# Patient Record
Sex: Male | Born: 1950
Health system: Southern US, Community
[De-identification: ages and names within clinical notes are randomized; demographics above are authoritative.]

## PROBLEM LIST (undated history)

## (undated) DIAGNOSIS — T7840XA Allergy, unspecified, initial encounter: Secondary | ICD-10-CM

## (undated) DIAGNOSIS — I10 Essential (primary) hypertension: Secondary | ICD-10-CM

## (undated) DIAGNOSIS — R0902 Hypoxemia: Secondary | ICD-10-CM

## (undated) DIAGNOSIS — I219 Acute myocardial infarction, unspecified: Secondary | ICD-10-CM

## (undated) DIAGNOSIS — I480 Paroxysmal atrial fibrillation: Secondary | ICD-10-CM

## (undated) DIAGNOSIS — Z8679 Personal history of other diseases of the circulatory system: Secondary | ICD-10-CM

## (undated) DIAGNOSIS — I714 Abdominal aortic aneurysm, without rupture, unspecified: Secondary | ICD-10-CM

## (undated) DIAGNOSIS — E785 Hyperlipidemia, unspecified: Secondary | ICD-10-CM

## (undated) DIAGNOSIS — I350 Nonrheumatic aortic (valve) stenosis: Secondary | ICD-10-CM

## (undated) DIAGNOSIS — M1990 Unspecified osteoarthritis, unspecified site: Secondary | ICD-10-CM

## (undated) DIAGNOSIS — IMO0001 Reserved for inherently not codable concepts without codable children: Secondary | ICD-10-CM

## (undated) DIAGNOSIS — I2510 Atherosclerotic heart disease of native coronary artery without angina pectoris: Secondary | ICD-10-CM

## (undated) DIAGNOSIS — J439 Emphysema, unspecified: Secondary | ICD-10-CM

## (undated) DIAGNOSIS — K760 Fatty (change of) liver, not elsewhere classified: Secondary | ICD-10-CM

## (undated) DIAGNOSIS — J449 Chronic obstructive pulmonary disease, unspecified: Secondary | ICD-10-CM

## (undated) DIAGNOSIS — R0600 Dyspnea, unspecified: Secondary | ICD-10-CM

## (undated) HISTORY — DX: Paroxysmal atrial fibrillation: I48.0

## (undated) HISTORY — DX: Reserved for inherently not codable concepts without codable children: IMO0001

## (undated) HISTORY — DX: Hyperlipidemia, unspecified: E78.5

## (undated) HISTORY — DX: Emphysema, unspecified: J43.9

## (undated) HISTORY — DX: Chronic obstructive pulmonary disease, unspecified: J44.9

## (undated) HISTORY — DX: Abdominal aortic aneurysm, without rupture: I71.4

## (undated) HISTORY — PX: FRACTURE SURGERY: SHX138

## (undated) HISTORY — DX: Hypoxemia: R09.02

## (undated) HISTORY — DX: Allergy, unspecified, initial encounter: T78.40XA

## (undated) HISTORY — DX: Essential (primary) hypertension: I10

## (undated) HISTORY — PX: APPENDECTOMY: SHX54

## (undated) HISTORY — PX: CARDIAC VALVE REPLACEMENT: SHX585

## (undated) HISTORY — DX: Personal history of other diseases of the circulatory system: Z86.79

## (undated) HISTORY — DX: Abdominal aortic aneurysm, without rupture, unspecified: I71.40

## (undated) HISTORY — DX: Fatty (change of) liver, not elsewhere classified: K76.0

---

## 2004-10-22 ENCOUNTER — Encounter: Payer: Self-pay | Admitting: Internal Medicine

## 2004-10-22 ENCOUNTER — Ambulatory Visit: Payer: Self-pay | Admitting: Internal Medicine

## 2004-10-22 ENCOUNTER — Ambulatory Visit (HOSPITAL_COMMUNITY): Admission: RE | Admit: 2004-10-22 | Discharge: 2004-10-22 | Payer: Self-pay | Admitting: Internal Medicine

## 2007-02-08 HISTORY — PX: ANKLE SURGERY: SHX546

## 2009-08-12 ENCOUNTER — Ambulatory Visit (HOSPITAL_COMMUNITY): Admission: RE | Admit: 2009-08-12 | Discharge: 2009-08-12 | Payer: Self-pay | Admitting: Family Medicine

## 2009-08-19 ENCOUNTER — Ambulatory Visit (HOSPITAL_COMMUNITY): Admission: RE | Admit: 2009-08-19 | Discharge: 2009-08-19 | Payer: Self-pay | Admitting: Family Medicine

## 2010-04-25 LAB — BLOOD GAS, ARTERIAL
Acid-Base Excess: 0.8 mmol/L (ref 0.0–2.0)
Bicarbonate: 25 mEq/L — ABNORMAL HIGH (ref 20.0–24.0)
FIO2: 0.21 %
O2 Saturation: 95 %
Patient temperature: 37
TCO2: 21.9 mmol/L (ref 0–100)
pCO2 arterial: 41.5 mmHg (ref 35.0–45.0)
pH, Arterial: 7.398 (ref 7.350–7.450)
pO2, Arterial: 72 mmHg — ABNORMAL LOW (ref 80.0–100.0)

## 2010-06-22 HISTORY — PX: COLONOSCOPY: SHX5424

## 2010-06-25 NOTE — Op Note (Signed)
NAME:  [name], [name]           ACCOUNT NO.:  [id number]   MEDICAL RECORD NO.:  [id number]          PATIENT TYPE:  AMB   LOCATION:  DAY                           FACILITY:  [facility]   PHYSICIAN:  R. Roetta Sessions, M.D. DATE OF BIRTH:  [birth year]   DATE OF PROCEDURE:  10/22/2004  DATE OF DISCHARGE:                                 OPERATIVE REPORT   PROCEDURE:  Colonoscopy with biopsy.   INDICATIONS FOR PROCEDURE:  The patient is a 60 year old Caucasian male with  no GI symptoms, referred at the courtesy of Dr. Lilyan Punt for colorectal  cancer screening. He has never had a colonoscopy. There is no family history  of colorectal neoplasia. Colonoscopy is now being done as a screening  maneuver. This approach has been discussed with the patient at length.  Potential risks, benefits, and alternatives have been reviewed and questions  answered.   PROCEDURE NOTE:  O2 saturation, blood pressure, pulse, and respirations were  monitored throughout the entire procedure. Conscious sedation with Versed 4  mg IV and Demerol 100 mg IV in divided doses.   INSTRUMENT:  Olympus video chip system.   FINDINGS:  Digital rectal exam revealed no abnormalities.   ENDOSCOPIC FINDINGS:  Prep was good.   Rectum:  Examination of the rectal mucosa including retroflexed view of the  anal verge revealed 2 to 3 mm polyp at 3 cm from the anal verge but  otherwise the rectal mucosa appeared normal.   Colon:  Colonic mucosa was surveyed from the rectosigmoid junction through  the left, transverse, and right colon to the area of the appendiceal  orifice, ileocecal valve, and cecum. These structures were well seen and  photographed for the record. From this level, the scope was slowly  withdrawn, and all previously mentioned mucosal surfaces were again seen.  The patient had a second 2-mm polyp at the mid ascending colon. The  remainder of the colonic mucosa appeared normal. The polyp in the ascending  colon  was cold biopsied/removed. Likewise, polyp in the rectum was cold  biopsied/removed. The patient tolerated the procedure well and was reactive  to endoscopy.   IMPRESSION:  Diminutive rectal and right colon polyps removed with cold  biopsy forceps technique. Remainder of the rectum and colon appeared normal.   RECOMMENDATIONS:  1.  Followup on pathology.  2.  Further recommendations to follow.      Jonathon Bellows, M.D.  Electronically Signed     RMR/MEDQ  D:  10/22/2004  T:  10/22/2004  Job:  [id number]   cc:   Lorin Picket A. Gerda Diss, MD  [address], [address]  [place]  [place] [address]  Fax: [phone]

## 2012-06-13 ENCOUNTER — Encounter: Payer: Self-pay | Admitting: *Deleted

## 2012-06-14 ENCOUNTER — Encounter: Payer: Self-pay | Admitting: Family Medicine

## 2012-06-14 ENCOUNTER — Ambulatory Visit (INDEPENDENT_AMBULATORY_CARE_PROVIDER_SITE_OTHER): Payer: BC Managed Care – PPO | Admitting: Family Medicine

## 2012-06-14 VITALS — BP 136/86 | Temp 98.4°F | Ht 70.0 in | Wt 208.2 lb

## 2012-06-14 DIAGNOSIS — I10 Essential (primary) hypertension: Secondary | ICD-10-CM | POA: Insufficient documentation

## 2012-06-14 DIAGNOSIS — J449 Chronic obstructive pulmonary disease, unspecified: Secondary | ICD-10-CM | POA: Insufficient documentation

## 2012-06-14 DIAGNOSIS — J309 Allergic rhinitis, unspecified: Secondary | ICD-10-CM

## 2012-06-14 MED ORDER — METHYLPREDNISOLONE ACETATE 40 MG/ML IJ SUSP
40.0000 mg | Freq: Once | INTRAMUSCULAR | Status: AC
  Administered 2012-06-14: 40 mg via INTRAMUSCULAR

## 2012-06-14 MED ORDER — FLUTICASONE PROPIONATE 50 MCG/ACT NA SUSP: 2.0000 | Freq: Every day | NASAL | Status: DC

## 2012-06-14 NOTE — Patient Instructions (Signed)
  Try generic allegra ( fexofenadine 180 mg)   Allergic Rhinitis Allergic rhinitis is when the mucous membranes in the nose respond to allergens. Allergens are particles in the air that cause your body to have an allergic reaction. This causes you to release allergic antibodies. Through a chain of events, these eventually cause you to release histamine into the blood stream (hence the use of antihistamines). Although meant to be protective to the body, it is this release that causes your discomfort, such as frequent sneezing, congestion and an itchy runny nose.  CAUSES  The pollen allergens may come from grasses, trees, and weeds. This is seasonal allergic rhinitis, or "hay fever." Other allergens cause year-round allergic rhinitis (perennial allergic rhinitis) such as house dust mite allergen, pet dander and mold spores.  SYMPTOMS   Nasal stuffiness (congestion).  Runny, itchy nose with sneezing and tearing of the eyes.  There is often an itching of the mouth, eyes and ears. It cannot be cured, but it can be controlled with medications. DIAGNOSIS  If you are unable to determine the offending allergen, skin or blood testing may find it. TREATMENT   Avoid the allergen.  Medications and allergy shots (immunotherapy) can help.  Hay fever may often be treated with antihistamines in pill or nasal spray forms. Antihistamines block the effects of histamine. There are over-the-counter medicines that may help with nasal congestion and swelling around the eyes. Check with your caregiver before taking or giving this medicine. If the treatment above does not work, there are many new medications your caregiver can prescribe. Stronger medications may be used if initial measures are ineffective. Desensitizing injections can be used if medications and avoidance fails. Desensitization is when a patient is given ongoing shots until the body becomes less sensitive to the allergen. Make sure you follow up with  your caregiver if problems continue. SEEK MEDICAL CARE IF:   You develop fever (more than 100.5 F (38.1 C).  You develop a cough that does not stop easily (persistent).  You have shortness of breath.  You start wheezing.  Symptoms interfere with normal daily activities. Document Released: 10/19/2000 Document Revised: 04/18/2011 Document Reviewed: 04/30/2008 [facility] Patient Information 2013 [place], [place].

## 2012-06-14 NOTE — Progress Notes (Signed)
  Subjective:    Patient ID: [name], male    DOB: 1950/11/25, 62 y.o.   MRN: [id number]  HPI Patient head congestion stuffiness drainage to having difficult time breathing through his nose coughing a fair amount denies any wheezing nausea vomiting or diarrhea. Energy level overall doing pretty good. Has COPD is been under good control. Also a history of hypertension.   Review of Systems No fevers no wheezing or difficulty breathing no swelling in the legs    Objective:   Physical Exam  Eardrums are normal nostrils congested sinus nontender throat is normal neck supple lungs clear heart regular      Assessment & Plan:  Allergic rhinitis-Depo-Medrol shot, generic of Allegra, Flonase 2 sprays each nostril daily, if progressive symptoms or worse followup. Find no evidence of sinusitis or pneumonia.

## 2012-07-17 ENCOUNTER — Other Ambulatory Visit: Payer: Self-pay | Admitting: Family Medicine

## 2012-08-18 ENCOUNTER — Other Ambulatory Visit: Payer: Self-pay | Admitting: Family Medicine

## 2012-10-09 ENCOUNTER — Telehealth: Payer: Self-pay | Admitting: Family Medicine

## 2012-10-09 DIAGNOSIS — E782 Mixed hyperlipidemia: Secondary | ICD-10-CM

## 2012-10-09 DIAGNOSIS — Z79899 Other long term (current) drug therapy: Secondary | ICD-10-CM

## 2012-10-09 DIAGNOSIS — Z125 Encounter for screening for malignant neoplasm of prostate: Secondary | ICD-10-CM

## 2012-10-09 NOTE — Telephone Encounter (Signed)
Patient is requesting blood work paper for wellness.please mail to patient.He will do blood work then schedule physical.

## 2012-10-09 NOTE — Telephone Encounter (Addendum)
Blood work Clinical biochemist and mailed to patient at there request.

## 2012-10-09 NOTE — Telephone Encounter (Signed)
That seems fine, if previous lab work from last year showed hyperglycemia I would recommend a hemoglobin A1c.

## 2012-10-09 NOTE — Telephone Encounter (Signed)
Pt with HTN    - Lipid , Liver Met 7 PSA -anything else you want ordered

## 2012-10-09 NOTE — Addendum Note (Signed)
Addended by: Margaretha Sheffield on: 10/09/2012 02:16 PM   Modules accepted: Orders

## 2012-10-18 LAB — BASIC METABOLIC PANEL
BUN: 14 mg/dL (ref 6–23)
CO2: 28 mEq/L (ref 19–32)
Calcium: 9.7 mg/dL (ref 8.4–10.5)
Chloride: 105 mEq/L (ref 96–112)
Creat: 0.92 mg/dL (ref 0.50–1.35)
Glucose, Bld: 91 mg/dL (ref 70–99)
Potassium: 5.1 mEq/L (ref 3.5–5.3)
Sodium: 140 mEq/L (ref 135–145)

## 2012-10-18 LAB — LIPID PANEL
Cholesterol: 180 mg/dL (ref 0–200)
HDL: 22 mg/dL — ABNORMAL LOW (ref >39)
LDL Cholesterol: 103 mg/dL — ABNORMAL HIGH (ref 0–99)
Total CHOL/HDL Ratio: 8.2 Ratio
Triglycerides: 276 mg/dL — ABNORMAL HIGH (ref <150)
VLDL: 55 mg/dL — ABNORMAL HIGH (ref 0–40)

## 2012-10-18 LAB — HEPATIC FUNCTION PANEL
ALT: 27 U/L (ref 0–53)
AST: 31 U/L (ref 0–37)
Albumin: 4 g/dL (ref 3.5–5.2)
Alkaline Phosphatase: 30 U/L — ABNORMAL LOW (ref 39–117)
Bilirubin, Direct: 0.1 mg/dL (ref 0.0–0.3)
Indirect Bilirubin: 0.3 mg/dL (ref 0.0–0.9)
Total Bilirubin: 0.4 mg/dL (ref 0.3–1.2)
Total Protein: 6.7 g/dL (ref 6.0–8.3)

## 2012-10-18 LAB — PSA: PSA: 1.31 ng/mL (ref <=4.00)

## 2012-10-22 ENCOUNTER — Other Ambulatory Visit: Payer: Self-pay | Admitting: Family Medicine

## 2012-10-29 ENCOUNTER — Other Ambulatory Visit: Payer: Self-pay | Admitting: Family Medicine

## 2012-11-01 ENCOUNTER — Ambulatory Visit (INDEPENDENT_AMBULATORY_CARE_PROVIDER_SITE_OTHER): Payer: BC Managed Care – PPO | Admitting: Family Medicine

## 2012-11-01 ENCOUNTER — Encounter: Payer: Self-pay | Admitting: Family Medicine

## 2012-11-01 VITALS — BP 134/82 | Ht 69.0 in | Wt 208.0 lb

## 2012-11-01 DIAGNOSIS — J449 Chronic obstructive pulmonary disease, unspecified: Secondary | ICD-10-CM

## 2012-11-01 DIAGNOSIS — Z0000 Encounter for general adult medical examination without abnormal findings: Secondary | ICD-10-CM

## 2012-11-01 DIAGNOSIS — I10 Essential (primary) hypertension: Secondary | ICD-10-CM

## 2012-11-01 MED ORDER — FLUTICASONE-SALMETEROL 100-50 MCG/DOSE IN AEPB: INHALATION_SPRAY | RESPIRATORY_TRACT | Status: DC

## 2012-11-01 MED ORDER — ATORVASTATIN CALCIUM 40 MG PO TABS: ORAL_TABLET | ORAL | Status: DC

## 2012-11-01 MED ORDER — TIOTROPIUM BROMIDE MONOHYDRATE 18 MCG IN CAPS: ORAL_CAPSULE | RESPIRATORY_TRACT | Status: DC

## 2012-11-01 MED ORDER — FENOFIBRATE 160 MG PO TABS: 160.0000 mg | ORAL_TABLET | Freq: Every day | ORAL | Status: DC

## 2012-11-01 MED ORDER — LISINOPRIL 10 MG PO TABS: 10.0000 mg | ORAL_TABLET | Freq: Every day | ORAL | Status: DC

## 2012-11-01 NOTE — Patient Instructions (Addendum)
DASH Diet  The DASH diet stands for "Dietary Approaches to Stop Hypertension." It is a healthy eating plan that has been shown to reduce high blood pressure (hypertension) in as little as 14 days, while also possibly providing other significant health benefits. These other health benefits include reducing the risk of breast cancer after menopause and reducing the risk of type 2 diabetes, heart disease, colon cancer, and stroke. Health benefits also include weight loss and slowing kidney failure in patients with chronic kidney disease.   DIET GUIDELINES  · Limit salt (sodium). Your diet should contain less than 1500 mg of sodium daily.  · Limit refined or processed carbohydrates. Your diet should include mostly whole grains. Desserts and added sugars should be used sparingly.  · Include small amounts of heart-healthy fats. These types of fats include nuts, oils, and tub margarine. Limit saturated and trans fats. These fats have been shown to be harmful in the body.  CHOOSING FOODS   The following food groups are based on a 2000 calorie diet. See your Registered Dietitian for individual calorie needs.  Grains and Grain Products (6 to 8 servings daily)  · Eat More Often: Whole-wheat bread, brown rice, whole-grain or wheat pasta, quinoa, popcorn without added fat or salt (air popped).  · Eat Less Often: White bread, white pasta, white rice, cornbread.  Vegetables (4 to 5 servings daily)  · Eat More Often: Fresh, frozen, and canned vegetables. Vegetables may be raw, steamed, roasted, or grilled with a minimal amount of fat.  · Eat Less Often/Avoid: Creamed or fried vegetables. Vegetables in a cheese sauce.  Fruit (4 to 5 servings daily)  · Eat More Often: All fresh, canned (in natural juice), or frozen fruits. Dried fruits without added sugar. One hundred percent fruit juice (½ cup [237 mL] daily).  · Eat Less Often: Dried fruits with added sugar. Canned fruit in light or heavy syrup.  Lean Meats, Fish, and Poultry (2  servings or less daily. One serving is 3 to 4 oz [85-114 g]).  · Eat More Often: Ninety percent or leaner ground beef, tenderloin, sirloin. Round cuts of beef, chicken breast, turkey breast. All fish. Grill, bake, or broil your meat. Nothing should be fried.  · Eat Less Often/Avoid: Fatty cuts of meat, turkey, or chicken leg, thigh, or wing. Fried cuts of meat or fish.  Dairy (2 to 3 servings)  · Eat More Often: Low-fat or fat-free milk, low-fat plain or light yogurt, reduced-fat or part-skim cheese.  · Eat Less Often/Avoid: Milk (whole, 2%). Whole milk yogurt. Full-fat cheeses.  Nuts, Seeds, and Legumes (4 to 5 servings per week)  · Eat More Often: All without added salt.  · Eat Less Often/Avoid: Salted nuts and seeds, canned beans with added salt.  Fats and Sweets (limited)  · Eat More Often: Vegetable oils, tub margarines without trans fats, sugar-free gelatin. Mayonnaise and salad dressings.  · Eat Less Often/Avoid: Coconut oils, palm oils, butter, stick margarine, cream, half and half, cookies, candy, pie.  FOR MORE INFORMATION  The Dash Diet Eating Plan: www.dashdiet.org  Document Released: 01/13/2011 Document Revised: 04/18/2011 Document Reviewed: 01/13/2011  ExitCare® Patient Information ©2014 [employer].

## 2012-11-01 NOTE — Progress Notes (Signed)
  Subjective:    Patient ID: [name], male    DOB: 27-May-1950, 62 y.o.   MRN: [id number]  HPI Patient is here today for annual exam.  Pt has no concerns.  The patient comes in today for a wellness visit.  A review of their health history was completed.  A review of medications was also completed. Any necessary refills were discussed. Sensible healthy diet was discussed. Importance of minimizing excessive salt and carbohydrates was also discussed. Safety was stressed including driving, activities at work and at home where applicable. Importance of regular physical activity for overall health was discussed. Preventative measures appropriate for age were discussed. Time was spent with the patient discussing any concerns they have about their well-being. His lab was reviewed in detail with the patient. He is compliant with his medicines. He does try to watch his diet. He does try to stay physically active. Denies any chest tightness pressure pain shortness breath.   Review of Systems  Constitutional: Negative for fever, activity change and appetite change.  HENT: Negative for congestion, rhinorrhea and neck pain.   Eyes: Negative for discharge.  Respiratory: Negative for cough and wheezing.   Cardiovascular: Negative for chest pain.  Gastrointestinal: Negative for vomiting, abdominal pain and blood in stool.  Genitourinary: Negative for frequency and difficulty urinating.  Skin: Negative for rash.  Allergic/Immunologic: Negative for environmental allergies and food allergies.  Neurological: Negative for weakness and headaches.  Psychiatric/Behavioral: Negative for agitation.       Objective:   Physical Exam  Constitutional: He appears well-developed and well-nourished.  HENT:  Head: Normocephalic and atraumatic.  Right Ear: External ear normal.  Left Ear: External ear normal.  Nose: Nose normal.  Mouth/Throat: Oropharynx is clear and moist.  Eyes: EOM are normal.  Pupils are equal, round, and reactive to light.  Neck: Normal range of motion. Neck supple. No thyromegaly present.  Cardiovascular: Normal rate, regular rhythm and normal heart sounds.   No murmur heard. Pulmonary/Chest: Effort normal and breath sounds normal. No respiratory distress. He has no wheezes.  Abdominal: Soft. Bowel sounds are normal. He exhibits no distension and no mass. There is no tenderness.  Genitourinary: Rectum normal, prostate normal and penis normal.  Musculoskeletal: Normal range of motion. He exhibits no edema.  Lymphadenopathy:    He has no cervical adenopathy.  Neurological: He is alert. He exhibits normal muscle tone.  Skin: Skin is warm and dry. No erythema.  Psychiatric: He has a normal mood and affect. His behavior is normal. Judgment normal.          Assessment & Plan:  Wellness exam-continue all current medications. Watch diet closely exercise as tolerated. Lab work was reviewed with patient. No need to repeat any time soon. Followup again in 6 months. In addition to this if he has any type of respiratory illness high fever chills followup sooner. Shingles vaccine recommended prescription given

## 2013-04-10 ENCOUNTER — Encounter: Payer: Self-pay | Admitting: Family Medicine

## 2013-04-10 ENCOUNTER — Ambulatory Visit (INDEPENDENT_AMBULATORY_CARE_PROVIDER_SITE_OTHER): Payer: BC Managed Care – PPO | Admitting: Family Medicine

## 2013-04-10 VITALS — BP 132/80 | Temp 98.6°F | Ht 69.0 in | Wt 210.0 lb

## 2013-04-10 DIAGNOSIS — Z0000 Encounter for general adult medical examination without abnormal findings: Secondary | ICD-10-CM

## 2013-04-10 DIAGNOSIS — E785 Hyperlipidemia, unspecified: Secondary | ICD-10-CM

## 2013-04-10 MED ORDER — LEVOFLOXACIN 500 MG PO TABS: 500.0000 mg | ORAL_TABLET | Freq: Every day | ORAL | Status: AC

## 2013-04-10 MED ORDER — HYDROCODONE-HOMATROPINE 5-1.5 MG/5ML PO SYRP: 5.0000 mL | ORAL_SOLUTION | Freq: Four times a day (QID) | ORAL | Status: DC | PRN

## 2013-04-10 NOTE — Progress Notes (Signed)
   Subjective:    Patient ID: [name], male    DOB: 1950/09/02, 63 y.o.   MRN: [id number]  Cough This is a new problem. The current episode started in the past 7 days. Associated symptoms include headaches and a sore throat. Associated symptoms comments: Drainage from eyes. He has tried OTC cough suppressant (OTC meds) for the symptoms. His past medical history is significant for COPD.   he does relate bilateral eye conjunctivitis over the past 24 hours denies any wheezing PMH benign   Review of Systems  HENT: Positive for sore throat.   Respiratory: Positive for cough.   Neurological: Positive for headaches.       Objective:   Physical Exam  Bilateral conjunctivitis eardrums normal throat is normal neck supple lungs clear      Assessment & Plan:  Patient will do preventative lab work of cholesterol sugar he is on medication followup later this spring to see how this is doing  Sinusitis antibiotics prescribed warning signs discussed  Mild conjunctivitis should get better with antibiotics if not he will call back and we'll call in some drops

## 2013-04-23 ENCOUNTER — Encounter: Payer: Self-pay | Admitting: Family Medicine

## 2013-04-23 LAB — LIPID PANEL
Cholesterol: 167 mg/dL (ref 0–200)
HDL: 33 mg/dL — ABNORMAL LOW (ref >39)
LDL Cholesterol: 67 mg/dL (ref 0–99)
Total CHOL/HDL Ratio: 5.1 Ratio
Triglycerides: 333 mg/dL — ABNORMAL HIGH (ref <150)
VLDL: 67 mg/dL — ABNORMAL HIGH (ref 0–40)

## 2013-04-23 LAB — GLUCOSE, RANDOM: Glucose, Bld: 95 mg/dL (ref 70–99)

## 2013-05-02 ENCOUNTER — Encounter: Payer: Self-pay | Admitting: Family Medicine

## 2013-05-02 ENCOUNTER — Ambulatory Visit (INDEPENDENT_AMBULATORY_CARE_PROVIDER_SITE_OTHER): Payer: BC Managed Care – PPO | Admitting: Family Medicine

## 2013-05-02 VITALS — BP 134/84 | Ht 69.0 in | Wt 215.8 lb

## 2013-05-02 DIAGNOSIS — J449 Chronic obstructive pulmonary disease, unspecified: Secondary | ICD-10-CM

## 2013-05-02 DIAGNOSIS — I10 Essential (primary) hypertension: Secondary | ICD-10-CM

## 2013-05-02 DIAGNOSIS — E785 Hyperlipidemia, unspecified: Secondary | ICD-10-CM | POA: Insufficient documentation

## 2013-05-02 MED ORDER — ALBUTEROL SULFATE HFA 108 (90 BASE) MCG/ACT IN AERS: INHALATION_SPRAY | RESPIRATORY_TRACT | Status: DC

## 2013-05-02 MED ORDER — TIOTROPIUM BROMIDE MONOHYDRATE 18 MCG IN CAPS: ORAL_CAPSULE | RESPIRATORY_TRACT | Status: DC

## 2013-05-02 MED ORDER — FENOFIBRATE 160 MG PO TABS: 160.0000 mg | ORAL_TABLET | Freq: Every day | ORAL | Status: DC

## 2013-05-02 MED ORDER — FLUTICASONE PROPIONATE 50 MCG/ACT NA SUSP: 2.0000 | Freq: Every day | NASAL | Status: DC

## 2013-05-02 MED ORDER — FLUTICASONE-SALMETEROL 100-50 MCG/DOSE IN AEPB: INHALATION_SPRAY | RESPIRATORY_TRACT | Status: DC

## 2013-05-02 MED ORDER — LISINOPRIL 10 MG PO TABS: 10.0000 mg | ORAL_TABLET | Freq: Every day | ORAL | Status: DC

## 2013-05-02 MED ORDER — ATORVASTATIN CALCIUM 40 MG PO TABS: ORAL_TABLET | ORAL | Status: DC

## 2013-05-02 NOTE — Progress Notes (Signed)
   Subjective:    Patient ID: [name], male    DOB: February 16, 1950, 63 y.o.   MRN: [id number]  HPI  Patient arrives to follow up on cholesterol and discuss recent lab results. Patient reports no problems or concerns at this time.  Review of Systems     Objective:   Physical Exam        Assessment & Plan:  #1 COPD the rationale about his medications was reviewed in detail. He wondered if some of the medicine sees on TV would help him better. I believe he is thoroughly being covered with his current medication but if he is interested in a different medicine he needs to let us know we would let him know if we think it would help more. The pathophysiology of COPD was discussed in detail.  #2 HTN good control continue current measures  #3 hyperlipidemia triglycerides is elevated but otherwise LDL looks good HDL is low healthy diet regular activity and taken his medicines were discussed in detail.  #4 important to try to keep his weight down was discussed in order to lessen the risk of long-term health problems wellness exam later this year

## 2013-10-31 ENCOUNTER — Telehealth: Payer: Self-pay | Admitting: Family Medicine

## 2013-10-31 DIAGNOSIS — E785 Hyperlipidemia, unspecified: Secondary | ICD-10-CM

## 2013-10-31 DIAGNOSIS — Z125 Encounter for screening for malignant neoplasm of prostate: Secondary | ICD-10-CM

## 2013-10-31 DIAGNOSIS — R5383 Other fatigue: Secondary | ICD-10-CM

## 2013-10-31 DIAGNOSIS — R5381 Other malaise: Secondary | ICD-10-CM

## 2013-10-31 DIAGNOSIS — Z79899 Other long term (current) drug therapy: Secondary | ICD-10-CM

## 2013-10-31 NOTE — Telephone Encounter (Signed)
Patient needs order for blood work for upcoming physical. °

## 2013-10-31 NOTE — Telephone Encounter (Signed)
appt 10/21 for physical.  Last bloodwork 04/23/13 lipid, glucose

## 2013-11-03 NOTE — Telephone Encounter (Signed)
Lipid, liver, metabolic 7, PSA, CBC 

## 2013-11-04 NOTE — Telephone Encounter (Signed)
Notified patient's wife that bloodwork has been ordered.  

## 2013-11-08 LAB — LIPID PANEL
Cholesterol: 149 mg/dL (ref 0–200)
HDL: 30 mg/dL — ABNORMAL LOW (ref >39)
LDL Cholesterol: 48 mg/dL (ref 0–99)
Total CHOL/HDL Ratio: 5 Ratio
Triglycerides: 356 mg/dL — ABNORMAL HIGH (ref <150)
VLDL: 71 mg/dL — ABNORMAL HIGH (ref 0–40)

## 2013-11-08 LAB — BASIC METABOLIC PANEL
BUN: 13 mg/dL (ref 6–23)
CO2: 27 mEq/L (ref 19–32)
Calcium: 9.9 mg/dL (ref 8.4–10.5)
Chloride: 103 mEq/L (ref 96–112)
Creat: 0.89 mg/dL (ref 0.50–1.35)
Glucose, Bld: 115 mg/dL — ABNORMAL HIGH (ref 70–99)
Potassium: 4.4 mEq/L (ref 3.5–5.3)
Sodium: 141 mEq/L (ref 135–145)

## 2013-11-08 LAB — CBC WITH DIFFERENTIAL/PLATELET
Basophils Absolute: 0.1 10*3/uL (ref 0.0–0.1)
Basophils Relative: 1 % (ref 0–1)
Eosinophils Absolute: 0.2 10*3/uL (ref 0.0–0.7)
Eosinophils Relative: 2 % (ref 0–5)
HCT: 44.6 % (ref 39.0–52.0)
Hemoglobin: 15 g/dL (ref 13.0–17.0)
Lymphocytes Relative: 26 % (ref 12–46)
Lymphs Abs: 2.1 10*3/uL (ref 0.7–4.0)
MCH: 30.8 pg (ref 26.0–34.0)
MCHC: 33.6 g/dL (ref 30.0–36.0)
MCV: 91.6 fL (ref 78.0–100.0)
Monocytes Absolute: 1.1 10*3/uL — ABNORMAL HIGH (ref 0.1–1.0)
Monocytes Relative: 13 % — ABNORMAL HIGH (ref 3–12)
Neutro Abs: 4.8 10*3/uL (ref 1.7–7.7)
Neutrophils Relative %: 58 % (ref 43–77)
Platelets: 281 10*3/uL (ref 150–400)
RBC: 4.87 MIL/uL (ref 4.22–5.81)
RDW: 13.3 % (ref 11.5–15.5)
WBC: 8.2 10*3/uL (ref 4.0–10.5)

## 2013-11-08 LAB — HEPATIC FUNCTION PANEL
ALT: 23 U/L (ref 0–53)
AST: 31 U/L (ref 0–37)
Albumin: 4.2 g/dL (ref 3.5–5.2)
Alkaline Phosphatase: 37 U/L — ABNORMAL LOW (ref 39–117)
Bilirubin, Direct: 0.1 mg/dL (ref 0.0–0.3)
Indirect Bilirubin: 0.4 mg/dL (ref 0.2–1.2)
Total Bilirubin: 0.5 mg/dL (ref 0.2–1.2)
Total Protein: 6.8 g/dL (ref 6.0–8.3)

## 2013-11-09 LAB — PSA: PSA: 1.84 ng/mL (ref <=4.00)

## 2013-11-27 ENCOUNTER — Ambulatory Visit (INDEPENDENT_AMBULATORY_CARE_PROVIDER_SITE_OTHER): Payer: BC Managed Care – PPO | Admitting: Family Medicine

## 2013-11-27 ENCOUNTER — Encounter: Payer: Self-pay | Admitting: Family Medicine

## 2013-11-27 VITALS — BP 122/88 | Ht 69.0 in | Wt 218.0 lb

## 2013-11-27 DIAGNOSIS — J438 Other emphysema: Secondary | ICD-10-CM

## 2013-11-27 DIAGNOSIS — Z0000 Encounter for general adult medical examination without abnormal findings: Secondary | ICD-10-CM

## 2013-11-27 DIAGNOSIS — R7309 Other abnormal glucose: Secondary | ICD-10-CM

## 2013-11-27 DIAGNOSIS — I10 Essential (primary) hypertension: Secondary | ICD-10-CM

## 2013-11-27 DIAGNOSIS — R739 Hyperglycemia, unspecified: Secondary | ICD-10-CM

## 2013-11-27 DIAGNOSIS — R7303 Prediabetes: Secondary | ICD-10-CM | POA: Insufficient documentation

## 2013-11-27 DIAGNOSIS — E785 Hyperlipidemia, unspecified: Secondary | ICD-10-CM

## 2013-11-27 LAB — POCT GLYCOSYLATED HEMOGLOBIN (HGB A1C): Hemoglobin A1C: 5.7

## 2013-11-27 NOTE — Progress Notes (Signed)
   Subjective:    Patient ID: [name], male    DOB: Jul 18, 1950, 63 y.o.   MRN: [id number]  HPI The patient comes in today for a wellness visit.  A review of their health history was completed.  A review of medications was also completed.  Any needed refills: Unsure  Eating habits: Health conscious   Falls/  MVA accidents in past few months: No  Regular exercise: No  Specialist pt sees on regular basis: No  Preventative health issues were discussed.   Additional concerns: None   Had flu vaccine through work The patient COPD is stable. He is using his inhalers as directed. He has not had any progression in his symptoms.  Patient's lab work was reviewed in detail he does have elevated triglycerides I encouraged him to continue the medication but only use this oil twice per day  Fasting blood sugar is elevated patient was counseled at length about minimizing starches in the diet increasing physical activity trying to lose weight. A1c was 5.7.   Review of Systems  Constitutional: Negative for fever, activity change and appetite change.  HENT: Negative for congestion and rhinorrhea.   Eyes: Negative for discharge.  Respiratory: Negative for cough and wheezing.   Cardiovascular: Negative for chest pain.  Gastrointestinal: Negative for vomiting, abdominal pain and blood in stool.  Genitourinary: Negative for frequency and difficulty urinating.  Musculoskeletal: Negative for neck pain.  Skin: Negative for rash.  Allergic/Immunologic: Negative for environmental allergies and food allergies.  Neurological: Negative for weakness and headaches.  Psychiatric/Behavioral: Negative for agitation.       Objective:   Physical Exam  Constitutional: He appears well-developed and well-nourished.  HENT:  Head: Normocephalic and atraumatic.  Right Ear: External ear normal.  Left Ear: External ear normal.  Nose: Nose normal.  Mouth/Throat: Oropharynx is clear and moist.    Eyes: EOM are normal. Pupils are equal, round, and reactive to light.  Neck: Normal range of motion. Neck supple. No thyromegaly present.  Cardiovascular: Normal rate, regular rhythm and normal heart sounds.   No murmur heard. Pulmonary/Chest: Effort normal and breath sounds normal. No respiratory distress. He has no wheezes.  Abdominal: Soft. Bowel sounds are normal. He exhibits no distension and no mass. There is no tenderness.  Genitourinary: Rectum normal, prostate normal and penis normal.  Musculoskeletal: Normal range of motion. He exhibits no edema.  Lymphadenopathy:    He has no cervical adenopathy.  Neurological: He is alert. He exhibits normal muscle tone.  Skin: Skin is warm and dry. No erythema.  Psychiatric: He has a normal mood and affect. His behavior is normal. Judgment normal.          Assessment & Plan:  #1 wellness-safety measures dietary measures discussed. Patient is due for colonoscopy in September 2016 information was given he was encouraged by springtime to connect with the gastroenterologist  #2 prediabetes-I would recommend a recheck hemoglobin A1c in the spring time minimize starches regular physical activity try to bring weight down a little bit  #3 COPD stable continue current measures he is up-to-date on flu vaccine and pneumonia vaccine  #4 hyperlipidemia LDL is where it needs to be HDL typically runs low for this patient triglyceride slightly elevated he needs to watch his starches in his diet  #5 HTN good control continue current measures  Recheck patient in 6 months

## 2013-11-27 NOTE — Patient Instructions (Signed)
Diabetes Mellitus and Food It is important for you to manage your blood sugar (glucose) level. Your blood glucose level can be greatly affected by what you eat. Eating healthier foods in the appropriate amounts throughout the day at about the same time each day will help you control your blood glucose level. It can also help slow or prevent worsening of your diabetes mellitus. Healthy eating may even help you improve the level of your blood pressure and reach or maintain a healthy weight.  HOW CAN FOOD AFFECT ME? Carbohydrates Carbohydrates affect your blood glucose level more than any other type of food. Your dietitian will help you determine how many carbohydrates to eat at each meal and teach you how to count carbohydrates. Counting carbohydrates is important to keep your blood glucose at a healthy level, especially if you are using insulin or taking certain medicines for diabetes mellitus. Alcohol Alcohol can cause sudden decreases in blood glucose (hypoglycemia), especially if you use insulin or take certain medicines for diabetes mellitus. Hypoglycemia can be a life-threatening condition. Symptoms of hypoglycemia (sleepiness, dizziness, and disorientation) are similar to symptoms of having too much alcohol.  If your health care provider has given you approval to drink alcohol, do so in moderation and use the following guidelines:  Women should not have more than one drink per day, and men should not have more than two drinks per day. One drink is equal to:  12 oz of beer.  5 oz of wine.  1 oz of hard liquor.  Do not drink on an empty stomach.  Keep yourself hydrated. Have water, diet soda, or unsweetened iced tea.  Regular soda, juice, and other mixers might contain a lot of carbohydrates and should be counted. WHAT FOODS ARE NOT RECOMMENDED? As you make food choices, it is important to remember that all foods are not the same. Some foods have fewer nutrients per serving than other  foods, even though they might have the same number of calories or carbohydrates. It is difficult to get your body what it needs when you eat foods with fewer nutrients. Examples of foods that you should avoid that are high in calories and carbohydrates but low in nutrients include:  Trans fats (most processed foods list trans fats on the Nutrition Facts label).  Regular soda.  Juice.  Candy.  Sweets, such as cake, pie, doughnuts, and cookies.  Fried foods. WHAT FOODS CAN I EAT? Have nutrient-rich foods, which will nourish your body and keep you healthy. The food you should eat also will depend on several factors, including:  The calories you need.  The medicines you take.  Your weight.  Your blood glucose level.  Your blood pressure level.  Your cholesterol level. You also should eat a variety of foods, including:  Protein, such as meat, poultry, fish, tofu, nuts, and seeds (lean animal proteins are best).  Fruits.  Vegetables.  Dairy products, such as milk, cheese, and yogurt (low fat is best).  Breads, grains, pasta, cereal, rice, and beans.  Fats such as olive oil, trans fat-free margarine, canola oil, avocado, and olives. DOES EVERYONE WITH DIABETES MELLITUS HAVE THE SAME MEAL PLAN? Because every person with diabetes mellitus is different, there is not one meal plan that works for everyone. It is very important that you meet with a dietitian who will help you create a meal plan that is just right for you. Document Released: 10/21/2004 Document Revised: 01/29/2013 Document Reviewed: 12/21/2012 Exit[employer] Patient Information 2015 [employer]. This   information is not intended to replace advice given to you by your health care provider. Make sure you discuss any questions you have with your health care provider.  

## 2013-11-28 ENCOUNTER — Other Ambulatory Visit: Payer: Self-pay | Admitting: Family Medicine

## 2013-12-23 ENCOUNTER — Other Ambulatory Visit: Payer: Self-pay | Admitting: Family Medicine

## 2013-12-27 ENCOUNTER — Telehealth: Payer: Self-pay | Admitting: Internal Medicine

## 2013-12-27 ENCOUNTER — Other Ambulatory Visit: Payer: Self-pay | Admitting: Family Medicine

## 2013-12-27 NOTE — Telephone Encounter (Signed)
Patient not due for 1565yr tcs until 10/2014 but insurance will be changing and wants to know if it can be done in January.  Patient is having no problems   Please call [phone]

## 2013-12-27 NOTE — Telephone Encounter (Signed)
I called pt and LMOM that I will have him on my call list to contact in Dec to schedule colonoscopy for Jan 2016. Call if questions.

## 2013-12-30 ENCOUNTER — Other Ambulatory Visit: Payer: Self-pay | Admitting: Family Medicine

## 2014-01-29 ENCOUNTER — Other Ambulatory Visit: Payer: Self-pay | Admitting: Family Medicine

## 2014-02-21 ENCOUNTER — Other Ambulatory Visit: Payer: Self-pay | Admitting: Family Medicine

## 2014-02-22 ENCOUNTER — Other Ambulatory Visit: Payer: Self-pay | Admitting: Family Medicine

## 2014-03-11 ENCOUNTER — Telehealth: Payer: Self-pay

## 2014-03-11 NOTE — Telephone Encounter (Signed)
PATIENT RECEIVED LETTER TO SCHEDULE COLONOSCOPY PLEASE CALL  °

## 2014-03-11 NOTE — Telephone Encounter (Signed)
Pt is on recall for 10/2014. ( Last colonoscopy was 10/22/2004).

## 2014-03-19 ENCOUNTER — Telehealth: Payer: Self-pay

## 2014-03-19 ENCOUNTER — Other Ambulatory Visit: Payer: Self-pay

## 2014-03-19 DIAGNOSIS — Z1211 Encounter for screening for malignant neoplasm of colon: Secondary | ICD-10-CM

## 2014-03-25 NOTE — Telephone Encounter (Signed)
Gastroenterology Pre-Procedure Review  Request Date: 04/08/2014 Requesting Physician: Dr. Lilyan PuntScott [name]  PATIENT REVIEW QUESTIONS: The patient responded to the following health history questions as indicated:    Pt said he drinks a couple of beers a day  1. Diabetes Melitis: no 2. Joint replacements in the past 12 months: no 3. Major health problems in the past 3 months: no 4. Has an artificial valve or MVP: no 5. Has a defibrillator: no 6. Has been advised in past to take antibiotics in advance of a procedure like teeth cleaning: no    MEDICATIONS & ALLERGIES:    Patient reports the following regarding taking any blood thinners:   Plavix? no Aspirin? no Coumadin? no  Patient confirms/reports the following medications:  Current Outpatient Prescriptions  Medication Sig Dispense Refill  . ADVAIR DISKUS 100-50 MCG/DOSE AEPB INHALE ONE DOSE BY MOUTH TWICE DAILY 60 each 5  . atorvastatin (LIPITOR) 40 MG tablet TAKE ONE TABLET BY MOUTH ONCE DAILY 30 tablet 5  . fenofibrate 160 MG tablet TAKE ONE TABLET BY MOUTH ONCE DAILY 30 tablet 3  . fish oil-omega-3 fatty acids 1000 MG capsule Take 2 g by mouth daily.    [name]. lisinopril (PRINIVIL,ZESTRIL) 10 MG tablet TAKE ONE TABLET BY MOUTH ONCE DAILY 30 tablet 5  . Multiple Vitamin (MULTIVITAMIN) tablet Take 1 tablet by mouth daily.    [name]. SPIRIVA HANDIHALER 18 MCG inhalation capsule INHALE ONE DOSE ONCE DAILY 30 capsule 5  . VENTOLIN HFA 108 (90 BASE) MCG/ACT inhaler INHALE TWO PUFFS BY MOUTH EVERY 4 HOURS AS NEEDED 18 each 0  . fluticasone (FLONASE) 50 MCG/ACT nasal spray Place 2 sprays into both nostrils daily. (Patient not taking: Reported on 03/19/2014) 16 g 5   No current facility-administered medications for this visit.    Patient confirms/reports the following allergies:  No Known Allergies  No orders of the defined types were placed in this encounter.    AUTHORIZATION INFORMATION Primary Insurance:   ID #:  Group #:  Pre-Cert / Auth  required:  Pre-Cert / Auth #:   Secondary Insurance:  ID #:  Group #:  Pre-Cert / Auth required:  Pre-Cert / Auth #:   SCHEDULE INFORMATION: Procedure has been scheduled as follows:  Date: 04/08/2014                Time:  10:30 AM Location: [facility]  This Gastroenterology Pre-Precedure Review Form is being routed to the following provider(s): R. Roetta SessionsMichael Rourk, MD

## 2014-03-26 NOTE — Telephone Encounter (Signed)
Needs appt to decide about Propofol

## 2014-03-28 NOTE — Telephone Encounter (Signed)
Pt is aware he needs OV first. He is scheduled for 04/16/2014 at 8:30 AM with Wynne DustEric Gill, NP. His appt for TCS was cancelled and I [facility] for [facility].

## 2014-04-08 ENCOUNTER — Encounter (HOSPITAL_COMMUNITY): Admission: RE | Payer: Self-pay | Source: Ambulatory Visit

## 2014-04-08 ENCOUNTER — Ambulatory Visit (HOSPITAL_COMMUNITY)
Admission: RE | Admit: 2014-04-08 | Payer: BLUE CROSS/BLUE SHIELD | Source: Ambulatory Visit | Admitting: Internal Medicine

## 2014-04-08 SURGERY — COLONOSCOPY
Anesthesia: Moderate Sedation

## 2014-04-14 ENCOUNTER — Other Ambulatory Visit: Payer: Self-pay | Admitting: Family Medicine

## 2014-04-16 ENCOUNTER — Ambulatory Visit (INDEPENDENT_AMBULATORY_CARE_PROVIDER_SITE_OTHER): Payer: BLUE CROSS/BLUE SHIELD | Admitting: Nurse Practitioner

## 2014-04-16 ENCOUNTER — Other Ambulatory Visit: Payer: Self-pay

## 2014-04-16 ENCOUNTER — Encounter: Payer: Self-pay | Admitting: Nurse Practitioner

## 2014-04-16 VITALS — BP 112/71 | HR 86 | Temp 98.4°F | Ht 69.0 in | Wt 221.6 lb

## 2014-04-16 DIAGNOSIS — Z1211 Encounter for screening for malignant neoplasm of colon: Secondary | ICD-10-CM | POA: Insufficient documentation

## 2014-04-16 MED ORDER — PEG 3350-KCL-NA BICARB-NACL 420 G PO SOLR: 4000.0000 mL | Freq: Once | ORAL | Status: DC

## 2014-04-16 NOTE — Patient Instructions (Signed)
1. We will schedule your procedure (colonoscopy in the OR with stronger sedation medicine) for you. 2. Further recommendations to be based on the results of your procedure.

## 2014-04-16 NOTE — Assessment & Plan Note (Signed)
Last colonoscopy 10 years ago with polypectomy x 2, one hyperplastic and one with inflammation; recommended 10 year follow-up. Deneis any red flag/warning signs like overt GI bleed, change in weight, appetite, or bowel habits/consistency. Drinks up to 2 drinks a day of alcohol, will schedule in the OR with propofol for daily ETOH use.  Proceed with TCS with Dr. Jena Gaussourk in the OR with propofol for daily ETOH use in near future: the risks, benefits, and alternatives have been discussed with the patient in detail. The patient states understanding and desires to proceed.

## 2014-04-16 NOTE — Progress Notes (Signed)
Primary Care Physician:  Lilyan PuntLUKING,[name], MD Primary Gastroenterologist:  Dr. Jena [name]  Chief Complaint  Patient presents with  . Colonoscopy    HPI:   64 year old male presents for 10 year of llow-up colonoscopy. Last CS in 2006 which had polypectomy x 2 (rectum and right colon) with pathology showing inflammation in one polyp and hyperplasia without adenoma on the other.  Today he denies abdominal pain, N/V/D, hematochezia, melena, chest pain, fever, chills, unintentional weight loss, change in appetite, and change in bowel habits, dysphagia. Admits shortness of breath consistent with COPD history which is near baseline for him.Denies any other upper or lower GI synptoms  Past Medical History  Diagnosis Date  . White coat hypertension   . COPD (chronic obstructive pulmonary disease)   . Hyperlipidemia   . Hypertension     Past Surgical History  Procedure Laterality Date  . Appendectomy    . Colonoscopy  06/22/2010    ZOX:[id number]:Diminutive rectal and right colon polyps removed remainder rectum and colon appared normal  . Ankle surgery Right 2009    Current Outpatient Prescriptions  Medication Sig Dispense Refill  . ADVAIR DISKUS 100-50 MCG/DOSE AEPB INHALE ONE DOSE BY MOUTH TWICE DAILY 60 each 5  . atorvastatin (LIPITOR) 40 MG tablet TAKE ONE TABLET BY MOUTH ONCE DAILY 30 tablet 5  . fenofibrate 160 MG tablet TAKE ONE TABLET BY MOUTH ONCE DAILY 30 tablet 3  . fish oil-omega-3 fatty acids 1000 MG capsule Take 2 g by mouth daily.    . fluticasone (FLONASE) 50 MCG/ACT nasal spray Place 2 sprays into both nostrils daily. 16 g 5  . lisinopril (PRINIVIL,ZESTRIL) 10 MG tablet TAKE ONE TABLET BY MOUTH ONCE DAILY 30 tablet 5  . Multiple Vitamin (MULTIVITAMIN) tablet Take 1 tablet by mouth daily.    [name]. SPIRIVA HANDIHALER 18 MCG inhalation capsule INHALE ONE DOSE ONCE DAILY 30 capsule 5  . VENTOLIN HFA 108 (90 BASE) MCG/ACT inhaler INHALE TWO PUFFS BY MOUTH EVERY 4 HOURS AS NEEDED 18 each 0    No current facility-administered medications for this visit.    Allergies as of 04/16/2014  . (No Known Allergies)    Family History  Problem Relation Age of Onset  . Hypertension Sister   . Colon cancer Neg Hx     History   Social History  . Marital Status: Married    Spouse Name: N/A  . Number of Children: N/A  . Years of Education: N/A   Occupational History  . Not on file.   Social History Main Topics  . Smoking status: Former Smoker    Quit date: 04/16/2002  . Smokeless tobacco: Never Used  . Alcohol Use: 0.0 oz/week    0 Standard drinks or equivalent per week     Comment: drinks up to 2 beers a day x 10 years  . Drug Use: No  . Sexual Activity: Not on file   Other Topics Concern  . Not on file   Social History Narrative    Review of Systems: General: Negative for anorexia, weight loss, fever, chills, fatigue, weakness. ENT: Negative for hoarseness, difficulty swallowing. CV: Negative for chest pain, angina, palpitations, worse then baseline dyspnea on exertion, peripheral edema.  Respiratory: Negative for dyspnea at rest. Denies worse then baseline dyspnea on exertion, cough, sputum, wheezing.  GI: See history of present illness. MS: Negative for joint pain, low back pain.  Derm: Negative for rash or itching.  Neuro: Negative for weakness, seizure, memory loss,  confusion.  Psych: Negative for anxiety, depression, hallucinations.  Endo: Negative for unusual weight change.  Heme: Negative for bruising or bleeding. Allergy: Negative for rash or hives.  Physical Exam: BP 112/71 mmHg  Pulse 86  Temp(Src) 98.4 F (36.9 C) (Oral)  Ht  (1.753 m)  Wt 221 lb 9.6 oz (100.517 kg)  BMI 32.71 kg/m2 General:   Alert and oriented. Pleasant and cooperative. Well-nourished and well-developed.  Head:  Normocephalic and atraumatic. Eyes:  Without icterus, sclera clear and conjunctiva pink.  Ears:  Normal auditory acuity. Mouth:  No deformity or lesions,  oral mucosa pink. No OP edema. Neck:  Supple, without mass or thyromegaly. Lungs:  Clear to auscultation bilaterally. Minimal expiratory wheezes. No rales, or rhonchi. No distress.  Heart:  S1, S2 present without murmurs appreciated.  Abdomen:  Rounded, +BS, soft, non-tender and non-distended. No HSM noted. No guarding or rebound. No masses appreciated.  Rectal:  Deferred  Msk:  Symmetrical without gross deformities. Normal posture. Extremities:  Without clubbing or edema. Neurologic:  Alert and  oriented x4;  grossly normal neurologically. Skin:  Intact without significant lesions or rashes. Cervical Nodes:  No significant cervical adenopathy. Psych:  Alert and cooperative. Normal mood and affect.     04/16/2014 9:23 AM

## 2014-04-16 NOTE — Progress Notes (Signed)
cc'ed to pcp °

## 2014-04-22 ENCOUNTER — Other Ambulatory Visit: Payer: Self-pay | Admitting: *Deleted

## 2014-04-22 MED ORDER — LISINOPRIL 10 MG PO TABS: 10.0000 mg | ORAL_TABLET | Freq: Every day | ORAL | Status: DC

## 2014-04-28 ENCOUNTER — Telehealth: Payer: Self-pay | Admitting: Family Medicine

## 2014-04-28 DIAGNOSIS — E785 Hyperlipidemia, unspecified: Secondary | ICD-10-CM

## 2014-04-28 DIAGNOSIS — Z79899 Other long term (current) drug therapy: Secondary | ICD-10-CM

## 2014-04-28 DIAGNOSIS — R739 Hyperglycemia, unspecified: Secondary | ICD-10-CM

## 2014-04-28 NOTE — Telephone Encounter (Signed)
Last labs 11/08/13 lipid, liv, bmp, psa, cbc

## 2014-04-28 NOTE — Telephone Encounter (Signed)
Calling to request BW for upcoming appointment.

## 2014-04-28 NOTE — Telephone Encounter (Signed)
Notified patient that blood work has been ordered and to report to [facility] (in this building) for draw.

## 2014-04-28 NOTE — Telephone Encounter (Signed)
Lipid.liver.met7.A1C htn,hyperlipid,hyperglycemia

## 2014-05-06 NOTE — Patient Instructions (Signed)
Lowell BoutonKenneth D Khalsa  05/06/2014   Your procedure is scheduled on:   05/15/2014  [facility] at  730  AM.  Call this number if you have problems the morning of surgery: [phone]   Remember:   Do not eat food or drink liquids after midnight.   Take these medicines the morning of surgery with A SIP OF WATER:  Lisinopril. Take your inhalers before you come.   Do not wear jewelry, make-up or nail polish.  Do not wear lotions, powders, or perfumes.  Do not shave 48 hours prior to surgery. Men may shave face and neck.  Do not bring valuables to the hospital.  [facility] is not responsible  for any belongings or valuables.               Contacts, dentures or bridgework may not be worn into surgery.  Leave suitcase in the car. After surgery it may be brought to your room.  For patients admitted to the hospital, discharge time is determined by your treatment team.               Patients discharged the day of surgery will not be allowed to drive home.  Name and phone number of your driver: family  Special Instructions: N/A   Please read over the following fact sheets that you were given: Pain Booklet, Coughing and Deep Breathing, Surgical Site Infection Prevention, Anesthesia Post-op Instructions and Care and Recovery After Surgery Colonoscopy A colonoscopy is an exam to look at the entire large intestine (colon). This exam can help find problems such as tumors, polyps, inflammation, and areas of bleeding. The exam takes about 1 hour.  LET [facility]ARE PROVIDER KNOW ABOUT:   Any allergies you have.  All medicines you are taking, including vitamins, herbs, eye drops, creams, and over-the-counter medicines.  Previous problems you or members of your family have had with the use of anesthetics.  Any blood disorders you have.  Previous surgeries you have had.  Medical conditions you have. RISKS AND COMPLICATIONS  Generally, this is a safe procedure. However, as with  any procedure, complications can occur. Possible complications include:  Bleeding.  Tearing or rupture of the colon wall.  Reaction to medicines given during the exam.  Infection (rare). BEFORE THE PROCEDURE   Ask your health care provider about changing or stopping your regular medicines.  You may be prescribed an oral bowel prep. This involves drinking a large amount of medicated liquid, starting the day before your procedure. The liquid will cause you to have multiple loose stools until your stool is almost clear or light green. This cleans out your colon in preparation for the procedure.  Do not eat or drink anything else once you have started the bowel prep, unless your health care provider tells you it is safe to do so.  Arrange for someone to drive you home after the procedure. PROCEDURE   You will be given medicine to help you relax (sedative).  You will lie on your side with your knees bent.  A long, flexible tube with a light and camera on the end (colonoscope) will be inserted through the rectum and into the colon. The camera sends video back to a computer screen as it moves through the colon. The colonoscope also releases carbon dioxide gas to inflate the colon. This helps your health care provider see the area better.  During the exam, your health care provider may  take a small tissue sample (biopsy) to be examined under a microscope if any abnormalities are found.  The exam is finished when the entire colon has been viewed. AFTER THE PROCEDURE   Do not drive for 24 hours after the exam.  You may have a small amount of blood in your stool.  You may pass moderate amounts of gas and have mild abdominal cramping or bloating. This is caused by the gas used to inflate your colon during the exam.  Ask when your test results will be ready and how you will get your results. Make sure you get your test results. Document Released: 01/22/2000 Document Revised: 11/14/2012  Document Reviewed: 10/01/2012 [facility] Patient Information 2015 [place], [place]. This information is not intended to replace advice given to you by your health care provider. Make sure you discuss any questions you have with your health care provider. PATIENT INSTRUCTIONS POST-ANESTHESIA  IMMEDIATELY FOLLOWING SURGERY:  Do not drive or operate machinery for the first twenty four hours after surgery.  Do not make any important decisions for twenty four hours after surgery or while taking narcotic pain medications or sedatives.  If you develop intractable nausea and vomiting or a severe headache please notify your doctor immediately.  FOLLOW-UP:  Please make an appointment with your surgeon as instructed. You do not need to follow up with anesthesia unless specifically instructed to do so.  WOUND CARE INSTRUCTIONS (if applicable):  Keep a dry clean dressing on the anesthesia/puncture wound site if there is drainage.  Once the wound has quit draining you may leave it open to air.  Generally you should leave the bandage intact for twenty four hours unless there is drainage.  If the epidural site drains for more than 36-48 hours please call the anesthesia department.  QUESTIONS?:  Please feel free to call your physician or the hospital operator if you have any questions, and they will be happy to assist you.

## 2014-05-07 ENCOUNTER — Encounter: Payer: Self-pay | Admitting: Family Medicine

## 2014-05-07 ENCOUNTER — Telehealth: Payer: Self-pay | Admitting: Nurse Practitioner

## 2014-05-07 ENCOUNTER — Ambulatory Visit (INDEPENDENT_AMBULATORY_CARE_PROVIDER_SITE_OTHER): Payer: BLUE CROSS/BLUE SHIELD | Admitting: Family Medicine

## 2014-05-07 VITALS — BP 128/88 | Temp 98.6°F | Ht 69.0 in | Wt 211.0 lb

## 2014-05-07 DIAGNOSIS — J438 Other emphysema: Secondary | ICD-10-CM

## 2014-05-07 NOTE — Pre-Procedure Instructions (Signed)
Patient in for PAT. He is very dyspnic, RR-26. Sat was 88%. Has hx of COPD but states that he has had increasing coughing and secretions over the last few weeks. He has not seen his PCP- Lorin PicketScott [name] in a while. Spoke with Dr Darrick PennaFields about patient due to Dr Jena Gaussourk being out of town. Called Dr Fletcher AnonLuking's office, they are to see patient at 1630 today and will determine if patient should proceed with procedure on 05/15/2014 or not. They will notify us tomorrow. Called Dr Luvenia Starchourk's office and spoke with Wynne DustEric Gill, PA. He will make a note concerning this and we will contact them back when we have heard from Dr Fletcher AnonLuking's office.

## 2014-05-07 NOTE — Progress Notes (Signed)
   Subjective:    Patient ID: [name], male    DOB: 1950-06-10, 64 y.o.   MRN: [id number]  Wheezing  This is a new problem. The current episode started today. His past medical history is significant for COPD.     He went for pre op for his colonoscopy and nurse told him he was wheezing and oxygen level was low 94 and needed to be seen. Using spiriva daily, advair bid, and ventolin prn.  Colonoscopy is scheduled for April 7th.  Patient no claims of excessive shortness of breath and this time. He feels that it is his baseline. No actual wheezing needing rescue inhaler.  No fever no congestion.   States he feels fine and baseline [id number] Review of Systems  Respiratory: Positive for wheezing.        Objective:   Physical Exam  Alert no apparent distress talkative. Vital stable. Lungs diminished breath sounds diffusely. No tachypnea no wheezes no crackles heart regular in rhythm H&T normal.      Assessment & Plan:  Impression COPD likely fairly severe in this patient. States he did get short of breath coming into the preop assessment. Plan we will contact preop department at phone number available. We will let them know that his COPD appears baseline and that he can proceed with the test with caution WSL

## 2014-05-07 NOTE — Telephone Encounter (Signed)
Call received from Daniel C. In short stay stating patient arrived for preop and was dyspnic, RR 26, sat 88% has not seen PCP in a while. Is scheduled to have TCS with RMR 05/15/14. Per Dr. Darrick PennaFields patient to see PCP today who will let short stay know if the patient can still have TCS done.

## 2014-05-08 ENCOUNTER — Telehealth: Payer: Self-pay | Admitting: *Deleted

## 2014-05-08 NOTE — Telephone Encounter (Signed)
Pt called and notified that he can proceed with colonoscopy per Dr. Brett CanalesSteve b/c his COPD was borderline. Pt verbalized understanding.

## 2014-05-08 NOTE — Telephone Encounter (Signed)
Pt called and Dr. Gerda DissLuking cleared him for the TCS and he has another pre-op appointment in the morning.

## 2014-05-09 ENCOUNTER — Encounter (HOSPITAL_COMMUNITY)
Admission: RE | Admit: 2014-05-09 | Discharge: 2014-05-09 | Disposition: A | Discharge status: Home / Self Care | Payer: BLUE CROSS/BLUE SHIELD | Source: Ambulatory Visit | Attending: Internal Medicine | Admitting: Internal Medicine

## 2014-05-09 ENCOUNTER — Encounter (HOSPITAL_COMMUNITY): Payer: Self-pay

## 2014-05-09 DIAGNOSIS — Z01812 Encounter for preprocedural laboratory examination: Secondary | ICD-10-CM | POA: Insufficient documentation

## 2014-05-09 LAB — BASIC METABOLIC PANEL
Anion gap: 10 (ref 5–15)
BUN: 13 mg/dL (ref 6–23)
CO2: 28 mmol/L (ref 19–32)
Calcium: 10.2 mg/dL (ref 8.4–10.5)
Chloride: 101 mmol/L (ref 96–112)
Creatinine, Ser: 0.85 mg/dL (ref 0.50–1.35)
GFR calc Af Amer: >90 mL/min (ref >90)
GFR calc non Af Amer: >90 mL/min (ref >90)
Glucose, Bld: 100 mg/dL — ABNORMAL HIGH (ref 70–99)
Potassium: 4.7 mmol/L (ref 3.5–5.1)
Sodium: 139 mmol/L (ref 135–145)

## 2014-05-09 LAB — CBC
HCT: 46.4 % (ref 39.0–52.0)
Hemoglobin: 15.7 g/dL (ref 13.0–17.0)
MCH: 31.3 pg (ref 26.0–34.0)
MCHC: 33.8 g/dL (ref 30.0–36.0)
MCV: 92.4 fL (ref 78.0–100.0)
Platelets: 238 10*3/uL (ref 150–400)
RBC: 5.02 MIL/uL (ref 4.22–5.81)
RDW: 13.2 % (ref 11.5–15.5)
WBC: 9 10*3/uL (ref 4.0–10.5)

## 2014-05-09 NOTE — Pre-Procedure Instructions (Signed)
Patient given information to sign up for my chart at home. 

## 2014-05-09 NOTE — Patient Instructions (Signed)
Lowell BoutonKenneth D Kinnamon  05/09/2014   Your procedure is scheduled on:   05/15/2014  [facility] at  730  AM.  Call this number if you have problems the morning of surgery: [phone]   Remember:   Do not eat food or drink liquids after midnight.   Take these medicines the morning of surgery with A SIP OF WATER:  Lisinopril. Take your inhalers before you come.   Do not wear jewelry, make-up or nail polish.  Do not wear lotions, powders, or perfumes.   Do not shave 48 hours prior to surgery. Men may shave face and neck.  Do not bring valuables to the hospital.  [facility] is not responsible for any belongings or valuables.               Contacts, dentures or bridgework may not be worn into surgery.  Leave suitcase in the car. After surgery it may be brought to your room.  For patients admitted to the hospital, discharge time is determined by your treatment team.               Patients discharged the day of surgery will not be allowed to drive home.  Name and phone number of your driver: family  Special Instructions: N/A   Please read over the following fact sheets that you were given: Pain Booklet, Coughing and Deep Breathing, Surgical Site Infection Prevention, Anesthesia Post-op Instructions and Care and Recovery After Surgery Colonoscopy A colonoscopy is an exam to look at the entire large intestine (colon). This exam can help find problems such as tumors, polyps, inflammation, and areas of bleeding. The exam takes about 1 hour.  LET [facility] PROVIDER KNOW ABOUT:   Any allergies you have.  All medicines you are taking, including vitamins, herbs, eye drops, creams, and over-the-counter medicines.  Previous problems you or members of your family have had with the use of anesthetics.  Any blood disorders you have.  Previous surgeries you have had.  Medical conditions you have. RISKS AND COMPLICATIONS  Generally, this is a safe procedure. However, as with  any procedure, complications can occur. Possible complications include:  Bleeding.  Tearing or rupture of the colon wall.  Reaction to medicines given during the exam.  Infection (rare). BEFORE THE PROCEDURE   Ask your health care provider about changing or stopping your regular medicines.  You may be prescribed an oral bowel prep. This involves drinking a large amount of medicated liquid, starting the day before your procedure. The liquid will cause you to have multiple loose stools until your stool is almost clear or light green. This cleans out your colon in preparation for the procedure.  Do not eat or drink anything else once you have started the bowel prep, unless your health care provider tells you it is safe to do so.  Arrange for someone to drive you home after the procedure. PROCEDURE   You will be given medicine to help you relax (sedative).  You will lie on your side with your knees bent.  A long, flexible tube with a light and camera on the end (colonoscope) will be inserted through the rectum and into the colon. The camera sends video back to a computer screen as it moves through the colon. The colonoscope also releases carbon dioxide gas to inflate the colon. This helps your health care provider see the area better.  During the exam, your health care provider may take  a small tissue sample (biopsy) to be examined under a microscope if any abnormalities are found.  The exam is finished when the entire colon has been viewed. AFTER THE PROCEDURE   Do not drive for 24 hours after the exam.  You may have a small amount of blood in your stool.  You may pass moderate amounts of gas and have mild abdominal cramping or bloating. This is caused by the gas used to inflate your colon during the exam.  Ask when your test results will be ready and how you will get your results. Make sure you get your test results. Document Released: 01/22/2000 Document Revised: 11/14/2012  Document Reviewed: 10/01/2012 [facility] Patient Information 2015 [place], [place]. This information is not intended to replace advice given to you by your health care provider. Make sure you discuss any questions you have with your health care provider. PATIENT INSTRUCTIONS POST-ANESTHESIA  IMMEDIATELY FOLLOWING SURGERY:  Do not drive or operate machinery for the first twenty four hours after surgery.  Do not make any important decisions for twenty four hours after surgery or while taking narcotic pain medications or sedatives.  If you develop intractable nausea and vomiting or a severe headache please notify your doctor immediately.  FOLLOW-UP:  Please make an appointment with your surgeon as instructed. You do not need to follow up with anesthesia unless specifically instructed to do so.  WOUND CARE INSTRUCTIONS (if applicable):  Keep a dry clean dressing on the anesthesia/puncture wound site if there is drainage.  Once the wound has quit draining you may leave it open to air.  Generally you should leave the bandage intact for twenty four hours unless there is drainage.  If the epidural site drains for more than 36-48 hours please call the anesthesia department.  QUESTIONS?:  Please feel free to call your physician or the hospital operator if you have any questions, and they will be happy to assist you.

## 2014-05-10 LAB — HEPATIC FUNCTION PANEL
ALT: 31 IU/L (ref 0–44)
AST: 49 IU/L — ABNORMAL HIGH (ref 0–40)
Albumin: 4.5 g/dL (ref 3.6–4.8)
Alkaline Phosphatase: 38 IU/L — ABNORMAL LOW (ref 39–117)
Bilirubin Total: 0.4 mg/dL (ref 0.0–1.2)
Bilirubin, Direct: 0.15 mg/dL (ref 0.00–0.40)
Total Protein: 7.2 g/dL (ref 6.0–8.5)

## 2014-05-10 LAB — HEMOGLOBIN A1C
Est. average glucose Bld gHb Est-mCnc: 123 mg/dL
Hgb A1c MFr Bld: 5.9 % — ABNORMAL HIGH (ref 4.8–5.6)

## 2014-05-10 LAB — BASIC METABOLIC PANEL
BUN/Creatinine Ratio: 13 (ref 10–22)
BUN: 12 mg/dL (ref 8–27)
CO2: 25 mmol/L (ref 18–29)
Calcium: 10 mg/dL (ref 8.6–10.2)
Chloride: 98 mmol/L (ref 97–108)
Creatinine, Ser: 0.91 mg/dL (ref 0.76–1.27)
GFR calc Af Amer: 104 mL/min/{1.73_m2} (ref >59)
GFR calc non Af Amer: 90 mL/min/{1.73_m2} (ref >59)
Glucose: 99 mg/dL (ref 65–99)
Potassium: 4.7 mmol/L (ref 3.5–5.2)
Sodium: 139 mmol/L (ref 134–144)

## 2014-05-10 LAB — LIPID PANEL
Chol/HDL Ratio: 8.9 ratio units — ABNORMAL HIGH (ref 0.0–5.0)
Cholesterol, Total: 187 mg/dL (ref 100–199)
HDL: 21 mg/dL — ABNORMAL LOW (ref >39)
Triglycerides: 478 mg/dL — ABNORMAL HIGH (ref 0–149)

## 2014-05-13 NOTE — Telephone Encounter (Signed)
noted 

## 2014-05-14 ENCOUNTER — Other Ambulatory Visit: Payer: Self-pay

## 2014-05-15 ENCOUNTER — Encounter (HOSPITAL_COMMUNITY): Payer: Self-pay | Admitting: *Deleted

## 2014-05-15 ENCOUNTER — Encounter (HOSPITAL_COMMUNITY): Admission: RE | Payer: Self-pay | Source: Ambulatory Visit

## 2014-05-15 ENCOUNTER — Encounter (HOSPITAL_COMMUNITY)
Admission: RE | Disposition: A | Discharge status: Home / Self Care | Payer: Self-pay | Source: Ambulatory Visit | Attending: Internal Medicine

## 2014-05-15 ENCOUNTER — Ambulatory Visit (HOSPITAL_COMMUNITY)
Admission: RE | Admit: 2014-05-15 | Discharge: 2014-05-15 | Disposition: A | Discharge status: Home / Self Care | Payer: BLUE CROSS/BLUE SHIELD | Source: Ambulatory Visit | Attending: Internal Medicine | Admitting: Internal Medicine

## 2014-05-15 ENCOUNTER — Ambulatory Visit (HOSPITAL_COMMUNITY): Payer: BLUE CROSS/BLUE SHIELD | Admitting: Anesthesiology

## 2014-05-15 ENCOUNTER — Ambulatory Visit (HOSPITAL_COMMUNITY)
Admission: RE | Admit: 2014-05-15 | Payer: BLUE CROSS/BLUE SHIELD | Source: Ambulatory Visit | Admitting: Internal Medicine

## 2014-05-15 DIAGNOSIS — Z9049 Acquired absence of other specified parts of digestive tract: Secondary | ICD-10-CM | POA: Insufficient documentation

## 2014-05-15 DIAGNOSIS — Z860100 Personal history of colon polyps, unspecified: Secondary | ICD-10-CM | POA: Insufficient documentation

## 2014-05-15 DIAGNOSIS — Z800 Family history of malignant neoplasm of digestive organs: Secondary | ICD-10-CM | POA: Insufficient documentation

## 2014-05-15 DIAGNOSIS — Z1211 Encounter for screening for malignant neoplasm of colon: Secondary | ICD-10-CM

## 2014-05-15 DIAGNOSIS — E785 Hyperlipidemia, unspecified: Secondary | ICD-10-CM | POA: Diagnosis not present

## 2014-05-15 DIAGNOSIS — K5730 Diverticulosis of large intestine without perforation or abscess without bleeding: Secondary | ICD-10-CM | POA: Diagnosis not present

## 2014-05-15 DIAGNOSIS — I10 Essential (primary) hypertension: Secondary | ICD-10-CM | POA: Insufficient documentation

## 2014-05-15 DIAGNOSIS — Z87891 Personal history of nicotine dependence: Secondary | ICD-10-CM | POA: Diagnosis not present

## 2014-05-15 DIAGNOSIS — J449 Chronic obstructive pulmonary disease, unspecified: Secondary | ICD-10-CM | POA: Diagnosis not present

## 2014-05-15 DIAGNOSIS — K648 Other hemorrhoids: Secondary | ICD-10-CM | POA: Insufficient documentation

## 2014-05-15 DIAGNOSIS — K635 Polyp of colon: Secondary | ICD-10-CM

## 2014-05-15 HISTORY — PX: COLONOSCOPY WITH PROPOFOL: SHX5780

## 2014-05-15 HISTORY — PX: POLYPECTOMY: SHX5525

## 2014-05-15 SURGERY — COLONOSCOPY WITH PROPOFOL
Anesthesia: Monitor Anesthesia Care

## 2014-05-15 SURGERY — COLONOSCOPY WITH PROPOFOL
Anesthesia: Monitor Anesthesia Care | Site: Anus

## 2014-05-15 MED ORDER — GLYCOPYRROLATE 0.2 MG/ML IJ SOLN
0.2000 mg | Freq: Once | INTRAMUSCULAR | Status: AC
  Administered 2014-05-15: 0.2 mg via INTRAVENOUS

## 2014-05-15 MED ORDER — FENTANYL CITRATE 0.05 MG/ML IJ SOLN
25.0000 ug | INTRAMUSCULAR | Status: AC
  Administered 2014-05-15 (×2): 25 ug via INTRAVENOUS

## 2014-05-15 MED ORDER — PROPOFOL INFUSION 10 MG/ML OPTIME
INTRAVENOUS | Status: DC | PRN
  Administered 2014-05-15: 100 ug/kg/min via INTRAVENOUS
  Administered 2014-05-15: 125 ug/kg/min via INTRAVENOUS

## 2014-05-15 MED ORDER — WATER FOR IRRIGATION, STERILE IR SOLN
Status: DC | PRN
  Administered 2014-05-15: 1000 mL via SURGICAL_CAVITY

## 2014-05-15 MED ORDER — PROPOFOL 10 MG/ML IV BOLUS
INTRAVENOUS | Status: AC
  Filled 2014-05-15: qty 20

## 2014-05-15 MED ORDER — LACTATED RINGERS IV SOLN
INTRAVENOUS | Status: DC
  Administered 2014-05-15: 08:00:00 via INTRAVENOUS

## 2014-05-15 MED ORDER — MIDAZOLAM HCL 5 MG/5ML IJ SOLN
INTRAMUSCULAR | Status: DC | PRN
  Administered 2014-05-15: 2 mg via INTRAVENOUS

## 2014-05-15 MED ORDER — ONDANSETRON HCL 4 MG/2ML IJ SOLN: 4.0000 mg | Freq: Once | INTRAMUSCULAR | Status: DC | PRN

## 2014-05-15 MED ORDER — PHENYLEPHRINE HCL 10 MG/ML IJ SOLN
INTRAMUSCULAR | Status: AC
  Filled 2014-05-15: qty 1

## 2014-05-15 MED ORDER — GLYCOPYRROLATE 0.2 MG/ML IJ SOLN
INTRAMUSCULAR | Status: AC
  Filled 2014-05-15: qty 1

## 2014-05-15 MED ORDER — LIDOCAINE HCL (PF) 1 % IJ SOLN
INTRAMUSCULAR | Status: AC
  Filled 2014-05-15: qty 5

## 2014-05-15 MED ORDER — STERILE WATER FOR IRRIGATION IR SOLN
Status: DC | PRN
  Administered 2014-05-15: 1000 mL

## 2014-05-15 MED ORDER — FENTANYL CITRATE 0.05 MG/ML IJ SOLN
INTRAMUSCULAR | Status: AC
  Filled 2014-05-15: qty 2

## 2014-05-15 MED ORDER — MIDAZOLAM HCL 2 MG/2ML IJ SOLN
1.0000 mg | INTRAMUSCULAR | Status: DC | PRN
  Administered 2014-05-15 (×2): 2 mg via INTRAVENOUS
  Filled 2014-05-15 (×2): qty 2

## 2014-05-15 MED ORDER — MIDAZOLAM HCL 2 MG/2ML IJ SOLN
INTRAMUSCULAR | Status: AC
  Filled 2014-05-15: qty 2

## 2014-05-15 MED ORDER — ARTIFICIAL TEARS OP OINT
TOPICAL_OINTMENT | OPHTHALMIC | Status: AC
  Filled 2014-05-15: qty 3.5

## 2014-05-15 MED ORDER — SODIUM CHLORIDE 0.9 % IJ SOLN
INTRAMUSCULAR | Status: AC
  Filled 2014-05-15: qty 10

## 2014-05-15 MED ORDER — FENTANYL CITRATE 0.05 MG/ML IJ SOLN: 25.0000 ug | INTRAMUSCULAR | Status: DC | PRN

## 2014-05-15 MED ORDER — FENTANYL CITRATE 0.05 MG/ML IJ SOLN
INTRAMUSCULAR | Status: DC | PRN
  Administered 2014-05-15: 50 ug via INTRAVENOUS

## 2014-05-15 MED ORDER — PROPOFOL 10 MG/ML IV BOLUS
INTRAVENOUS | Status: DC | PRN
  Administered 2014-05-15 (×3): 10 mg via INTRAVENOUS

## 2014-05-15 SURGICAL SUPPLY — 24 items
ELECT REM PT RETURN 9FT ADLT (ELECTROSURGICAL)
ELECTRODE REM PT RTRN 9FT ADLT (ELECTROSURGICAL) IMPLANT
FCP BXJMBJMB 240X2.8X (CUTTING FORCEPS)
FLOOR PAD 36X40 (MISCELLANEOUS) ×4
FORCEPS BIOP RAD 4 LRG CAP 4 (CUTTING FORCEPS) ×2 IMPLANT
FORCEPS BIOP RJ4 240 W/NDL (CUTTING FORCEPS)
FORCEPS BXJMBJMB 240X2.8X (CUTTING FORCEPS) IMPLANT
FORMALIN 10 PREFIL 20ML (MISCELLANEOUS) ×2 IMPLANT
INJECTOR/SNARE I SNARE (MISCELLANEOUS) ×2 IMPLANT
KIT CLEAN ENDO COMPLIANCE (KITS) ×4 IMPLANT
LUBRICANT JELLY 4.5OZ STERILE (MISCELLANEOUS) ×2 IMPLANT
MANIFOLD NEPTUNE II (INSTRUMENTS) ×2 IMPLANT
NDL SCLEROTHERAPY 25GX240 (NEEDLE) IMPLANT
NEEDLE SCLEROTHERAPY 25GX240 (NEEDLE) IMPLANT
PAD FLOOR 36X40 (MISCELLANEOUS) IMPLANT
PROBE APC STR FIRE (PROBE) IMPLANT
PROBE INJECTION GOLD (MISCELLANEOUS)
PROBE INJECTION GOLD 7FR (MISCELLANEOUS) IMPLANT
SNARE ROTATE MED OVAL 20MM (MISCELLANEOUS) IMPLANT
SNARE SHORT THROW 13M SML OVAL (MISCELLANEOUS) ×4 IMPLANT
SYR 50ML LL SCALE MARK (SYRINGE) ×2 IMPLANT
TRAP SPECIMEN MUCOUS 40CC (MISCELLANEOUS) IMPLANT
TUBING IRRIGATION ENDOGATOR (MISCELLANEOUS) ×2 IMPLANT
WATER STERILE IRR 1000ML POUR (IV SOLUTION) ×2 IMPLANT

## 2014-05-15 NOTE — Transfer of Care (Signed)
Immediate Anesthesia Transfer of Care Note  Patient: [name]  Procedure(s) Performed: Procedure(s) with comments: COLONOSCOPY WITH PROPOFOL (N/A) - In cecum @ [phone], out @ 0937, withdrawal time 19 minutes POLYPECTOMY (N/A)  Patient Location: PACU  Anesthesia Type:MAC  Level of Consciousness: awake  Airway & Oxygen Therapy: Patient Spontanous Breathing  Post-op Assessment: Report given to RN  Post vital signs: Reviewed  Last Vitals:  Filed Vitals:   05/15/14 0855  BP: 112/72  Pulse:   Temp:   Resp: 23    Complications: No apparent anesthesia complications

## 2014-05-15 NOTE — Op Note (Signed)
[facility] [address] [place], [id number]   COLONOSCOPY PROCEDURE REPORT  PATIENT: Daniel BoutonRobertson, [name]  MR#: [id number] BIRTHDATE: 10/09/1950 , 64  yrs. old GENDER: male ENDOSCOPIST: R.  Roetta SessionsMichael Rourk, MD FACP [facility] REFERRED XB:[id number]:Stephen Gerda DissLuking, M.[name]. PROCEDURE DATE:  05/15/2014 PROCEDURE:   Colonoscopy with biopsy INDICATIONS:    Average risk colorectal cancer screening examination. MEDICATIONS: Deep sedation per Dr[facility]  [facility] ASA CLASS:       Class II  CONSENT: The risks, benefits, alternatives and imponderables including but not limited to bleeding, perforation as well as the possibility of a missed lesion have been reviewed.  The potential for biopsy, lesion removal, etc. have also been discussed. Questions have been answered.  All parties agreeable.  Please see the history and physical in the medical record for more information.  DESCRIPTION OF PROCEDURE:   After the risks benefits and alternatives of the procedure were thoroughly explained, informed consent was obtained.  The digital rectal exam revealed no abnormalities of the rectum.   The     endoscope was introduced through the anus and advanced to the cecum, which was identified by both the appendix and ileocecal valve. No adverse events experienced.   The quality of the prep was adequate  The instrument was then slowly withdrawn as the colon was fully examined.      COLON FINDINGS: Normal rectum.  Scattered left-sided diverticula. Somewhat redundant colon.  (1) diminutive polyp in the distal transverse segment; otherwise, the remainder of the colonic mucosa appeared normal.  The above mentioned polyp was cold biopsy/removed.  Retroflexion was performed. .  Withdrawal time=19 minutes 0 seconds.  The scope was withdrawn and the procedure completed. COMPLICATIONS: There were no immediate complications.  ENDOSCOPIC IMPRESSION: Colonic diverticulosis. Redundant colon. Single  colonic polyp?"removed as described above.  RECOMMENDATIONS: Follow-up on pathology.  eSigned:  R. Roetta SessionsMichael Rourk, MD Jerrel IvoryFACP [facility] 05/15/2014 9:44 AM   cc:  CPT CODES: ICD CODES:  The ICD and CPT codes recommended by this software are interpretations from the data that the clinical staff has captured with the software.  The verification of the translation of this report to the ICD and CPT codes and modifiers is the sole responsibility of the health care institution and practicing physician where this report was generated.  [employer] will not be held responsible for the validity of the ICD and CPT codes included on this report.  AMA assumes no liability for data contained or not contained herein. CPT is a Publishing rights managerregistered trademark of the [employer].

## 2014-05-15 NOTE — Interval H&P Note (Signed)
History and Physical Interval Note:  05/15/2014 8:49 AM  Lowell BoutonKenneth D Lampson  has presented today for surgery, with the diagnosis of screening  The various methods of treatment have been discussed with the patient and family. After consideration of risks, benefits and other options for treatment, the patient has consented to  Procedure(s) with comments: COLONOSCOPY WITH PROPOFOL (N/A) - 900 as a surgical intervention .  The patient's history has been reviewed, patient examined, no change in status, stable for surgery.  I have reviewed the patient's chart and labs.  Questions were answered to the patient's satisfaction.     Elliett Guarisco  No change. Colonoscopy for screening purposes per plan..  The risks, benefits, limitations, alternatives and imponderables have been reviewed with the patient. Questions have been answered. All parties are agreeable.

## 2014-05-15 NOTE — Anesthesia Preprocedure Evaluation (Signed)
Anesthesia Evaluation  Patient identified by MRN, date of birth, ID band Patient awake    Reviewed: Allergy & Precautions, NPO status , Patient's Chart, lab work & pertinent test results  Airway Mallampati: I  TM Distance: >3 FB     Dental  (+) Teeth Intact   Pulmonary COPD (used inhaler this am) COPD inhaler, former smoker,  breath sounds clear to auscultation        Cardiovascular hypertension, Pt. on medications Rhythm:Regular Rate:Normal     Neuro/Psych    GI/Hepatic   Endo/Other  Pre diabetes   Renal/GU      Musculoskeletal   Abdominal   Peds  Hematology   Anesthesia Other Findings   Reproductive/Obstetrics                             Anesthesia Physical Anesthesia Plan  ASA: III  Anesthesia Plan: MAC   Post-op Pain Management:    Induction: Intravenous  Airway Management Planned: Simple Face Mask  Additional Equipment:   Intra-op Plan:   Post-operative Plan:   Informed Consent: I have reviewed the patients History and Physical, chart, labs and discussed the procedure including the risks, benefits and alternatives for the proposed anesthesia with the patient or authorized representative who has indicated his/her understanding and acceptance.     Plan Discussed with:   Anesthesia Plan Comments:         Anesthesia Quick Evaluation

## 2014-05-15 NOTE — Anesthesia Postprocedure Evaluation (Signed)
  Anesthesia Post-op Note  Patient: [name]  Procedure(s) Performed: Procedure(s) with comments: COLONOSCOPY WITH PROPOFOL (N/A) - In cecum @ [phone] [id number], out @ 470 015 [id number], withdrawal time 19 minutes POLYPECTOMY (N/A)  Patient Location: PACU  Anesthesia Type:MAC  Level of Consciousness: awake, alert  and oriented  Airway and Oxygen Therapy: Patient Spontanous Breathing  Post-op Pain: none  Post-op Assessment: Post-op Vital signs reviewed, Patient's Cardiovascular Status Stable, Respiratory Function Stable, Patent Airway and No signs of Nausea or vomiting  Post-op Vital Signs: Reviewed and stable  Last Vitals:  Filed Vitals:   05/15/14 0855  BP: 112/72  Pulse:   Temp:   Resp: 23    Complications: No apparent anesthesia complications

## 2014-05-15 NOTE — H&P (View-Only) (Signed)
Primary Care Physician:  Lilyan PuntLUKING,[name], MD Primary Gastroenterologist:  Dr. Jena Gaussourk  Chief Complaint  Patient presents with  . Colonoscopy    HPI:   64 year old male presents for 10 year of llow-up colonoscopy. Last CS in 2006 which had polypectomy x 2 (rectum and right colon) with pathology showing inflammation in one polyp and hyperplasia without adenoma on the other.  Today he denies abdominal pain, N/V/D, hematochezia, melena, chest pain, fever, chills, unintentional weight loss, change in appetite, and change in bowel habits, dysphagia. Admits shortness of breath consistent with COPD history which is near baseline for him.Denies any other upper or lower GI synptoms  Past Medical History  Diagnosis Date  . White coat hypertension   . COPD (chronic obstructive pulmonary disease)   . Hyperlipidemia   . Hypertension     Past Surgical History  Procedure Laterality Date  . Appendectomy    . Colonoscopy  06/22/2010    [id number]:Diminutive rectal and right colon polyps removed remainder rectum and colon appared normal  . Ankle surgery Right 2009    Current Outpatient Prescriptions  Medication Sig Dispense Refill  . ADVAIR DISKUS 100-50 MCG/DOSE AEPB INHALE ONE DOSE BY MOUTH TWICE DAILY 60 each 5  . atorvastatin (LIPITOR) 40 MG tablet TAKE ONE TABLET BY MOUTH ONCE DAILY 30 tablet 5  . fenofibrate 160 MG tablet TAKE ONE TABLET BY MOUTH ONCE DAILY 30 tablet 3  . fish oil-omega-3 fatty acids 1000 MG capsule Take 2 g by mouth daily.    . fluticasone (FLONASE) 50 MCG/ACT nasal spray Place 2 sprays into both nostrils daily. 16 g 5  . lisinopril (PRINIVIL,ZESTRIL) 10 MG tablet TAKE ONE TABLET BY MOUTH ONCE DAILY 30 tablet 5  . Multiple Vitamin (MULTIVITAMIN) tablet Take 1 tablet by mouth daily.    [name]. SPIRIVA HANDIHALER 18 MCG inhalation capsule INHALE ONE DOSE ONCE DAILY 30 capsule 5  . VENTOLIN HFA 108 (90 BASE) MCG/ACT inhaler INHALE TWO PUFFS BY MOUTH EVERY 4 HOURS AS NEEDED 18 each 0    No current facility-administered medications for this visit.    Allergies as of 04/16/2014  . (No Known Allergies)    Family History  Problem Relation Age of Onset  . Hypertension Sister   . Colon cancer Neg Hx     History   Social History  . Marital Status: Married    Spouse Name: N/A  . Number of Children: N/A  . Years of Education: N/A   Occupational History  . Not on file.   Social History Main Topics  . Smoking status: Former Smoker    Quit date: 04/16/2002  . Smokeless tobacco: Never Used  . Alcohol Use: 0.0 oz/week    0 Standard drinks or equivalent per week     Comment: drinks up to 2 beers a day x 10 years  . Drug Use: No  . Sexual Activity: Not on file   Other Topics Concern  . Not on file   Social History Narrative    Review of Systems: General: Negative for anorexia, weight loss, fever, chills, fatigue, weakness. ENT: Negative for hoarseness, difficulty swallowing. CV: Negative for chest pain, angina, palpitations, worse then baseline dyspnea on exertion, peripheral edema.  Respiratory: Negative for dyspnea at rest. Denies worse then baseline dyspnea on exertion, cough, sputum, wheezing.  GI: See history of present illness. MS: Negative for joint pain, low back pain.  Derm: Negative for rash or itching.  Neuro: Negative for weakness, seizure, memory loss,  confusion.  Psych: Negative for anxiety, depression, hallucinations.  Endo: Negative for unusual weight change.  Heme: Negative for bruising or bleeding. Allergy: Negative for rash or hives.  Physical Exam: BP 112/71 mmHg  Pulse 86  Temp(Src) 98.4 F (36.9 C) (Oral)  Ht  (1.753 m)  Wt 221 lb 9.6 oz (100.517 kg)  BMI 32.71 kg/m2 General:   Alert and oriented. Pleasant and cooperative. Well-nourished and well-developed.  Head:  Normocephalic and atraumatic. Eyes:  Without icterus, sclera clear and conjunctiva pink.  Ears:  Normal auditory acuity. Mouth:  No deformity or lesions,  oral mucosa pink. No OP edema. Neck:  Supple, without mass or thyromegaly. Lungs:  Clear to auscultation bilaterally. Minimal expiratory wheezes. No rales, or rhonchi. No distress.  Heart:  S1, S2 present without murmurs appreciated.  Abdomen:  Rounded, +BS, soft, non-tender and non-distended. No HSM noted. No guarding or rebound. No masses appreciated.  Rectal:  Deferred  Msk:  Symmetrical without gross deformities. Normal posture. Extremities:  Without clubbing or edema. Neurologic:  Alert and  oriented x4;  grossly normal neurologically. Skin:  Intact without significant lesions or rashes. Cervical Nodes:  No significant cervical adenopathy. Psych:  Alert and cooperative. Normal mood and affect.     04/16/2014 9:23 AM

## 2014-05-15 NOTE — Discharge Instructions (Signed)
°Colonoscopy °Discharge Instructions ° °Read the instructions outlined below and refer to this sheet in the next few weeks. These discharge instructions provide you with general information on caring for yourself after you leave the hospital. Your doctor may also give you specific instructions. While your treatment has been planned according to the most current medical practices available, unavoidable complications occasionally occur. If you have any problems or questions after discharge, call Dr. Castiel Lauricella at [phone]. °ACTIVITY °· You may resume your regular activity, but move at a slower pace for the next 24 hours.  °· Take frequent rest periods for the next 24 hours.  °· Walking will help get rid of the air and reduce the bloated feeling in your belly (abdomen).  °· No driving for 24 hours (because of the medicine (anesthesia) used during the test).   °· Do not sign any important legal documents or operate any machinery for 24 hours (because of the anesthesia used during the test).  °NUTRITION °· Drink plenty of fluids.  °· You may resume your normal diet as instructed by your doctor.  °· Begin with a light meal and progress to your normal diet. Heavy or fried foods are harder to digest and may make you feel sick to your stomach (nauseated).  °· Avoid alcoholic beverages for 24 hours or as instructed.  °MEDICATIONS °· You may resume your normal medications unless your doctor tells you otherwise.  °WHAT YOU CAN EXPECT TODAY °· Some feelings of bloating in the abdomen.  °· Passage of more gas than usual.  °· Spotting of blood in your stool or on the toilet paper.  °IF YOU HAD POLYPS REMOVED DURING THE COLONOSCOPY: °· No aspirin products for 7 days or as instructed.  °· No alcohol for 7 days or as instructed.  °· Eat a soft diet for the next 24 hours.  °FINDING OUT THE RESULTS OF YOUR TEST °Not all test results are available during your visit. If your test results are not back during the visit, make an appointment  with your caregiver to find out the results. Do not assume everything is normal if you have not heard from your caregiver or the medical facility. It is important for you to follow up on all of your test results.  °SEEK IMMEDIATE MEDICAL ATTENTION IF: °· You have more than a spotting of blood in your stool.  °· Your belly is swollen (abdominal distention).  °· You are nauseated or vomiting.  °· You have a temperature over 101.  °· You have abdominal pain or discomfort that is severe or gets worse throughout the day.  ° °Diverticulosis and polyp information provided ° °Further recommendations to follow pending review of pathology report ° ° °Diverticulosis °Diverticulosis is the condition that develops when small pouches (diverticula) form in the wall of your colon. Your colon, or large intestine, is where water is absorbed and stool is formed. The pouches form when the inside layer of your colon pushes through weak spots in the outer layers of your colon. °CAUSES  °No one knows exactly what causes diverticulosis. °RISK FACTORS °· Being older than 50. Your risk for this condition increases with age. Diverticulosis is rare in people younger than 40 years. By age 80, almost everyone has it. °· Eating a low-fiber diet. °· Being frequently constipated. °· Being overweight. °· Not getting enough exercise. °· Smoking. °· Taking over-the-counter pain medicines, like aspirin and ibuprofen. °SYMPTOMS  °Most people with diverticulosis do not have symptoms. °DIAGNOSIS  °Because   diverticulosis often has no symptoms, health care providers often discover the condition during an exam for other colon problems. In many cases, a health care provider will diagnose diverticulosis while using a flexible scope to examine the colon (colonoscopy). °TREATMENT  °If you have never developed an infection related to diverticulosis, you may not need treatment. If you have had an infection before, treatment may include: °· Eating more fruits,  vegetables, and grains. °· Taking a fiber supplement. °· Taking a live bacteria supplement (probiotic). °· Taking medicine to relax your colon. °HOME CARE INSTRUCTIONS  °· Drink at least 6-8 glasses of water each day to prevent constipation. °· Try not to strain when you have a bowel movement. °· Keep all follow-up appointments. °If you have had an infection before:  °· Increase the fiber in your diet as directed by your health care provider or dietitian. °· Take a dietary fiber supplement if your health care provider approves. °· Only take medicines as directed by your health care provider. °SEEK MEDICAL CARE IF:  °· You have abdominal pain. °· You have bloating. °· You have cramps. °· You have not gone to the bathroom in 3 days. °SEEK IMMEDIATE MEDICAL CARE IF:  °· Your pain gets worse. °· Your bloating becomes very bad. °· You have a fever or chills, and your symptoms suddenly get worse. °· You begin vomiting. °· You have bowel movements that are bloody or black. °MAKE SURE YOU: °· Understand these instructions. °· Will watch your condition. °· Will get help right away if you are not doing well or get worse. °Document Released: 10/22/2003 Document Revised: 01/29/2013 Document Reviewed: 12/19/2012 °ExitCare® Patient Information ©2015 [employer]. This information is not intended to replace advice given to you by your health care provider. Make sure you discuss any questions you have with your health care provider. ° ° °Colon Polyps °Polyps are lumps of extra tissue growing inside the body. Polyps can grow in the large intestine (colon). Most colon polyps are noncancerous (benign). However, some colon polyps can become cancerous over time. Polyps that are larger than a pea may be harmful. To be safe, caregivers remove and test all polyps. °CAUSES  °Polyps form when mutations in the genes cause your cells to grow and divide even though no more tissue is needed. °RISK FACTORS °There are a number of risk factors  that can increase your chances of getting colon polyps. They include: °· Being older than 50 years. °· Family history of colon polyps or colon cancer. °· Long-term colon diseases, such as colitis or Crohn disease. °· Being overweight. °· Smoking. °· Being inactive. °· Drinking too much alcohol. °SYMPTOMS  °Most small polyps do not cause symptoms. If symptoms are present, they may include: °· Blood in the stool. The stool may look dark red or black. °· Constipation or diarrhea that lasts longer than 1 week. °DIAGNOSIS °People often do not know they have polyps until their caregiver finds them during a regular checkup. Your caregiver can use 4 tests to check for polyps: °· Digital rectal exam. The caregiver wears gloves and feels inside the rectum. This test would find polyps only in the rectum. °· Barium enema. The caregiver puts a liquid called barium into your rectum before taking X-rays of your colon. Barium makes your colon look white. Polyps are dark, so they are easy to see in the X-ray pictures. °· Sigmoidoscopy. A thin, flexible tube (sigmoidoscope) is placed into your rectum. The sigmoidoscope has a light and tiny camera   in it. The caregiver uses the sigmoidoscope to look at the last third of your colon. °· Colonoscopy. This test is like sigmoidoscopy, but the caregiver looks at the entire colon. This is the most common method for finding and removing polyps. °TREATMENT  °Any polyps will be removed during a sigmoidoscopy or colonoscopy. The polyps are then tested for cancer. °PREVENTION  °To help lower your risk of getting more colon polyps: °· Eat plenty of fruits and vegetables. Avoid eating fatty foods. °· Do not smoke. °· Avoid drinking alcohol. °· Exercise every day. °· Lose weight if recommended by your caregiver. °· Eat plenty of calcium and folate. Foods that are rich in calcium include milk, cheese, and broccoli. Foods that are rich in folate include chickpeas, kidney beans, and spinach. °HOME CARE  INSTRUCTIONS °Keep all follow-up appointments as directed by your caregiver. You may need periodic exams to check for polyps. °SEEK MEDICAL CARE IF: °You notice bleeding during a bowel movement. °Document Released: 10/21/2003 Document Revised: 04/18/2011 Document Reviewed: 04/05/2011 °ExitCare® Patient Information ©2015 [employer]. This information is not intended to replace advice given to you by your health care provider. Make sure you discuss any questions you have with your health care provider. ° °

## 2014-05-16 NOTE — Addendum Note (Signed)
Addendum  created 05/16/14 [id number] by Moshe SalisburyKaren E Ayza Ripoll, CRNA   Modules edited: Anesthesia Attestations

## 2014-05-17 ENCOUNTER — Encounter: Payer: Self-pay | Admitting: Internal Medicine

## 2014-05-19 ENCOUNTER — Encounter (HOSPITAL_COMMUNITY): Payer: Self-pay | Admitting: Internal Medicine

## 2014-05-20 ENCOUNTER — Ambulatory Visit (INDEPENDENT_AMBULATORY_CARE_PROVIDER_SITE_OTHER): Payer: BLUE CROSS/BLUE SHIELD | Admitting: Family Medicine

## 2014-05-20 ENCOUNTER — Encounter: Payer: Self-pay | Admitting: Family Medicine

## 2014-05-20 VITALS — BP 132/82 | Ht 69.0 in | Wt 213.0 lb

## 2014-05-20 DIAGNOSIS — J438 Other emphysema: Secondary | ICD-10-CM

## 2014-05-20 DIAGNOSIS — R7309 Other abnormal glucose: Secondary | ICD-10-CM

## 2014-05-20 DIAGNOSIS — E785 Hyperlipidemia, unspecified: Secondary | ICD-10-CM | POA: Diagnosis not present

## 2014-05-20 DIAGNOSIS — I10 Essential (primary) hypertension: Secondary | ICD-10-CM | POA: Diagnosis not present

## 2014-05-20 DIAGNOSIS — R7303 Prediabetes: Secondary | ICD-10-CM

## 2014-05-20 MED ORDER — FLUTICASONE-SALMETEROL 100-50 MCG/DOSE IN AEPB: 1.0000 | INHALATION_SPRAY | Freq: Two times a day (BID) | RESPIRATORY_TRACT | Status: DC

## 2014-05-20 MED ORDER — LISINOPRIL 10 MG PO TABS: 10.0000 mg | ORAL_TABLET | Freq: Every day | ORAL | Status: DC

## 2014-05-20 MED ORDER — FENOFIBRATE 160 MG PO TABS: 160.0000 mg | ORAL_TABLET | Freq: Every day | ORAL | Status: DC

## 2014-05-20 MED ORDER — MOMETASONE FUROATE 50 MCG/ACT NA SUSP
2.0000 | Freq: Every day | NASAL | Status: DC
Start: 2014-05-20 — End: 2014-12-01

## 2014-05-20 NOTE — Progress Notes (Signed)
   Subjective:    Patient ID: [name], male    DOB: 09-17-1950, [age] y.o.   MRN: [id number]  Hypertension This is a chronic problem. Pertinent negatives include no chest pain. Risk factors for coronary artery disease include dyslipidemia and male gender. Treatments tried: lisinopril. There are no compliance problems.    We talked at length about his COPD we also went over his lab work including elevated triglycerides in the importance of getting this under better control  He also admits to taking in too much starches and also couple beers per day we talked about the importance of cleaning up his diet and exercising.  Patient states is more difficult to work then it used to be and as a result he is having a hard time getting through her workday he is thinking about filing for disability Review of Systems  Constitutional: Negative for activity change, appetite change and fatigue.  HENT: Negative for congestion.   Respiratory: Negative for cough.   Cardiovascular: Negative for chest pain.  Gastrointestinal: Negative for abdominal pain.  Endocrine: Negative for polydipsia and polyphagia.  Neurological: Negative for weakness.  Psychiatric/Behavioral: Negative for confusion.       Objective:   Physical Exam  Constitutional: He appears well-nourished. No distress.  Cardiovascular: Normal rate, regular rhythm and normal heart sounds.   No murmur heard. Pulmonary/Chest: Effort normal and breath sounds normal. No respiratory distress.  Musculoskeletal: He exhibits no edema.  Lymphadenopathy:    He has no cervical adenopathy.  Neurological: He is alert.  Psychiatric: His behavior is normal.  Vitals reviewed.         Assessment & Plan:  Sleep survey given patient denies sleep apnea  COPD stable patient defers on pulmonary function testing he does state it's harder to work gets out of breath more easily he is contemplating filing for disability  Takes his cholesterol  medicine on a regular basis but he does admit consuming some beers as well as too much starches he is getting clean up his diet I recommended he repeat lipid liver profile in 8-12 weeks  Also recommend for the patient consider starting 81 mg aspirin to prevent heart attacks and strokes  He may also add flaxseed to help with his triglycerides  Blood pressure overall good continue current measures

## 2014-06-01 ENCOUNTER — Other Ambulatory Visit: Payer: Self-pay | Admitting: Family Medicine

## 2014-08-25 ENCOUNTER — Other Ambulatory Visit: Payer: Self-pay | Admitting: Family Medicine

## 2014-11-13 ENCOUNTER — Telehealth: Payer: Self-pay | Admitting: Family Medicine

## 2014-11-13 DIAGNOSIS — E785 Hyperlipidemia, unspecified: Secondary | ICD-10-CM

## 2014-11-13 DIAGNOSIS — R7303 Prediabetes: Secondary | ICD-10-CM

## 2014-11-13 DIAGNOSIS — Z79899 Other long term (current) drug therapy: Secondary | ICD-10-CM

## 2014-11-13 DIAGNOSIS — Z125 Encounter for screening for malignant neoplasm of prostate: Secondary | ICD-10-CM

## 2014-11-13 DIAGNOSIS — I10 Essential (primary) hypertension: Secondary | ICD-10-CM

## 2014-11-13 NOTE — Telephone Encounter (Signed)
Labs ordered. Pt notified.

## 2014-11-13 NOTE — Telephone Encounter (Signed)
Met 7, lipid, liver, hemoglobin A1c, PSA-hypertension, hypertriglyceridemia, hyperglycemia

## 2014-11-13 NOTE — Telephone Encounter (Signed)
Pt needs bw orders for appt later this month  Last labs 05/09/14  BMP, CBC, Lip, Hep A1C ran by you an Dr Jena Gauss same day   PSA ran 11-08-13

## 2014-11-25 LAB — BASIC METABOLIC PANEL
BUN/Creatinine Ratio: 13 (ref 10–22)
BUN: 11 mg/dL (ref 8–27)
CO2: 26 mmol/L (ref 18–29)
Calcium: 9.9 mg/dL (ref 8.6–10.2)
Chloride: 102 mmol/L (ref 97–106)
Creatinine, Ser: 0.84 mg/dL (ref 0.76–1.27)
GFR calc Af Amer: 108 mL/min/{1.73_m2} (ref >59)
GFR calc non Af Amer: 94 mL/min/{1.73_m2} (ref >59)
Glucose: 93 mg/dL (ref 65–99)
Potassium: 4.8 mmol/L (ref 3.5–5.2)
Sodium: 141 mmol/L (ref 136–144)

## 2014-11-25 LAB — PSA: Prostate Specific Ag, Serum: 1.5 ng/mL (ref 0.0–4.0)

## 2014-11-25 LAB — HEMOGLOBIN A1C
Est. average glucose Bld gHb Est-mCnc: 120 mg/dL
Hgb A1c MFr Bld: 5.8 % — ABNORMAL HIGH (ref 4.8–5.6)

## 2014-11-25 LAB — LIPID PANEL
Chol/HDL Ratio: 4.4 ratio units (ref 0.0–5.0)
Cholesterol, Total: 180 mg/dL (ref 100–199)
HDL: 41 mg/dL (ref >39)
LDL Calculated: 105 mg/dL — ABNORMAL HIGH (ref 0–99)
Triglycerides: 169 mg/dL — ABNORMAL HIGH (ref 0–149)
VLDL Cholesterol Cal: 34 mg/dL (ref 5–40)

## 2014-11-25 LAB — HEPATIC FUNCTION PANEL
ALT: 43 IU/L (ref 0–44)
AST: 50 IU/L — ABNORMAL HIGH (ref 0–40)
Albumin: 4.3 g/dL (ref 3.6–4.8)
Alkaline Phosphatase: 36 IU/L — ABNORMAL LOW (ref 39–117)
Bilirubin Total: 0.3 mg/dL (ref 0.0–1.2)
Bilirubin, Direct: 0.15 mg/dL (ref 0.00–0.40)
Total Protein: 6.7 g/dL (ref 6.0–8.5)

## 2014-11-27 ENCOUNTER — Other Ambulatory Visit: Payer: Self-pay | Admitting: *Deleted

## 2014-11-27 MED ORDER — LISINOPRIL 10 MG PO TABS: 10.0000 mg | ORAL_TABLET | Freq: Every day | ORAL | Status: DC

## 2014-12-01 ENCOUNTER — Encounter: Payer: Self-pay | Admitting: Family Medicine

## 2014-12-01 ENCOUNTER — Ambulatory Visit (INDEPENDENT_AMBULATORY_CARE_PROVIDER_SITE_OTHER): Payer: BLUE CROSS/BLUE SHIELD | Admitting: Family Medicine

## 2014-12-01 VITALS — BP 152/64 | Ht 69.0 in | Wt 215.2 lb

## 2014-12-01 DIAGNOSIS — I10 Essential (primary) hypertension: Secondary | ICD-10-CM

## 2014-12-01 DIAGNOSIS — E785 Hyperlipidemia, unspecified: Secondary | ICD-10-CM

## 2014-12-01 DIAGNOSIS — Z23 Encounter for immunization: Secondary | ICD-10-CM

## 2014-12-01 DIAGNOSIS — Z0000 Encounter for general adult medical examination without abnormal findings: Secondary | ICD-10-CM

## 2014-12-01 DIAGNOSIS — R748 Abnormal levels of other serum enzymes: Secondary | ICD-10-CM

## 2014-12-01 DIAGNOSIS — R7303 Prediabetes: Secondary | ICD-10-CM

## 2014-12-01 MED ORDER — FLUTICASONE-SALMETEROL 100-50 MCG/DOSE IN AEPB: 1.0000 | INHALATION_SPRAY | Freq: Two times a day (BID) | RESPIRATORY_TRACT | Status: DC

## 2014-12-01 MED ORDER — LISINOPRIL 20 MG PO TABS: 10.0000 mg | ORAL_TABLET | Freq: Every day | ORAL | Status: DC

## 2014-12-01 MED ORDER — TIOTROPIUM BROMIDE MONOHYDRATE 18 MCG IN CAPS: ORAL_CAPSULE | RESPIRATORY_TRACT | Status: DC

## 2014-12-01 MED ORDER — FENOFIBRATE 160 MG PO TABS: 160.0000 mg | ORAL_TABLET | Freq: Every day | ORAL | Status: DC

## 2014-12-01 MED ORDER — ATORVASTATIN CALCIUM 40 MG PO TABS: 40.0000 mg | ORAL_TABLET | Freq: Every day | ORAL | Status: DC

## 2014-12-01 MED ORDER — MOMETASONE FUROATE 50 MCG/ACT NA SUSP: 2.0000 | Freq: Every day | NASAL | Status: DC

## 2014-12-01 MED ORDER — ALBUTEROL SULFATE HFA 108 (90 BASE) MCG/ACT IN AERS: INHALATION_SPRAY | RESPIRATORY_TRACT | Status: DC

## 2014-12-01 NOTE — Progress Notes (Signed)
   Subjective:    Patient ID: [name], male    DOB: December 09, 1950, 64 y.o.   MRN: [id number]  HPI The patient comes in today for a wellness visit.    A review of their health history was completed.  A review of medications was also completed.  Any needed refills; yes  Eating habits: Pretty good, well balanced diet  Falls/  MVA accidents in past few months: None  Regular exercise: walks, 1 mile a day 4-5 days per week  Specialist pt sees on regular basis: None  Preventative health issues were discussed.   Additional concerns: Patient states no additional concerns this visit. Patient did have lab work completed. Liver enzymes are still elevated he relates he drinks 2 or 3 beers here and Medicare denies excessive use denies chest tightness pressure pain patient does relate COPD issues sometimes worse when the weather is really hot. Takes his medicine. Also takes blood pressure medicine in today blood pressure higher than what it should be.  Review of Systems  Constitutional: Negative for activity change, appetite change and fatigue.  HENT: Negative for congestion.   Respiratory: Negative for cough.   Cardiovascular: Negative for chest pain.  Gastrointestinal: Negative for abdominal pain.  Endocrine: Negative for polydipsia and polyphagia.  Neurological: Negative for weakness.  Psychiatric/Behavioral: Negative for confusion.       Objective:   Physical Exam  Constitutional: He appears well-nourished. No distress.  Cardiovascular: Normal rate, regular rhythm and normal heart sounds.   No murmur heard. Pulmonary/Chest: Effort normal and breath sounds normal. No respiratory distress.  Musculoskeletal: He exhibits no edema.  Lymphadenopathy:    He has no cervical adenopathy.  Neurological: He is alert.  Psychiatric: His behavior is normal.  Vitals reviewed.         Assessment & Plan:   patient overall is doing well. Safety dietary measures reviewed.  Encouraged eat healthy stay physically active continue on with medications.  1. Routine general medical examination at a health care facility See above  2. Essential hypertension Blood pressure overall slightly elevated above what I would like to see if increase lisinopril 20 mg daily. Monitor blood pressure follow-up again in 6 months.  3. Hyperlipidemia Patient needs to continue his cholesterol medicine. Lab work overall good. Watch diet closely.  4. Prediabetes Patient has prediabetes. He was encouraged minimize starches minimize alcohol use stay physically active try to lose a little bit of weight recheck 6 months  5. Elevated liver enzymes Patient is had a proximally 6 months worth of elevated liver enzymes I recommend this patient go ahead with ultrasound of the liver more than likely fatty liver. Await the results. - US Abdomen Complete  6. Encounter for immunization Flu vaccine today.

## 2014-12-01 NOTE — Patient Instructions (Signed)
Aspirin and Your Heart  Aspirin is a medicine that affects the way blood clots. Aspirin can be used to help reduce the risk of blood clots, heart attacks, and other heart-related problems.  SHOULD I TAKE ASPIRIN? Your health care provider will help you determine whether it is safe and beneficial for you to take aspirin daily. Taking aspirin daily may be beneficial if you:  Have had a heart attack or chest pain.  Have undergone open heart surgery such as coronary artery bypass surgery (CABG).  Have had coronary angioplasty.  Have experienced a stroke or transient ischemic attack (TIA).  Have peripheral vascular disease (PVD).  Have chronic heart rhythm problems such as atrial fibrillation. ARE THERE ANY RISKS OF TAKING ASPIRIN DAILY? Daily use of aspirin can increase your risk of side effects. Some of these include:  Bleeding. Bleeding problems can be minor or serious. An example of a minor problem is a cut that does not stop bleeding. An example of a more serious problem is stomach bleeding or bleeding into the brain. Your risk of bleeding is increased if you are also taking non-steroidal anti-inflammatory medicine (NSAIDs).  Increased bruising.  Upset stomach.  An allergic reaction. People who have nasal polyps have an increased risk of developing an aspirin allergy. WHAT ARE SOME GUIDELINES I SHOULD FOLLOW WHEN TAKING ASPIRIN?   Take aspirin only as directed by your health care provider. Make sure you understand how much you should take and what form you should take. The two forms of aspirin are:  Non-enteric-coated. This type of aspirin does not have a coating and is absorbed quickly. Non-enteric-coated aspirin is usually recommended for people with chest pain. This type of aspirin also comes in a chewable form.  Enteric-coated. This type of aspirin has a special coating that releases the medicine very slowly. Enteric-coated aspirin causes less stomach upset than non-enteric-coated  aspirin. This type of aspirin should not be chewed or crushed.  Drink alcohol in moderation. Drinking alcohol increases your risk of bleeding. WHEN SHOULD I SEEK MEDICAL CARE?   You have unusual bleeding or bruising.  You have stomach pain.  You have an allergic reaction. Symptoms of an allergic reaction include:  Hives.  Itchy skin.  Swelling of the lips, tongue, or face.  You have ringing in your ears. WHEN SHOULD I SEEK IMMEDIATE MEDICAL CARE?   Your bowel movements are bloody, dark red, or black in color.  You vomit or cough up blood.  You have blood in your urine.  You cough, wheeze, or feel short of breath. If you have any of the following symptoms, this is an emergency. Do not wait to see if the pain will go away. Get medical help at once. Call your local emergency services (911 in the [place] Do not drive yourself to the hospital.  You have severe chest pain, especially if the pain is crushing or pressure-like and spreads to the arms, back, neck, or jaw.  You have stroke-like symptoms, such as:   Loss of vision.   Difficulty talking.   Numbness or weakness on one side of your body.   Numbness or weakness in your arm or leg.   Not thinking clearly or feeling confused.    This information is not intended to replace advice given to you by your health care provider. Make sure you discuss any questions you have with your health care provider.   Document Released: 01/07/2008 Document Revised: 02/14/2014 Document Reviewed: 05/01/2013 Elsevier Interactive Patient Education 2016 Elsevier   Inc.  

## 2014-12-08 ENCOUNTER — Ambulatory Visit (HOSPITAL_COMMUNITY)
Admission: RE | Admit: 2014-12-08 | Discharge: 2014-12-08 | Disposition: A | Discharge status: Home / Self Care | Payer: BLUE CROSS/BLUE SHIELD | Source: Ambulatory Visit | Attending: Family Medicine | Admitting: Family Medicine

## 2014-12-08 DIAGNOSIS — R932 Abnormal findings on diagnostic imaging of liver and biliary tract: Secondary | ICD-10-CM | POA: Insufficient documentation

## 2014-12-08 DIAGNOSIS — R7989 Other specified abnormal findings of blood chemistry: Secondary | ICD-10-CM | POA: Insufficient documentation

## 2014-12-08 DIAGNOSIS — I714 Abdominal aortic aneurysm, without rupture: Secondary | ICD-10-CM | POA: Insufficient documentation

## 2014-12-08 DIAGNOSIS — N281 Cyst of kidney, acquired: Secondary | ICD-10-CM | POA: Diagnosis not present

## 2014-12-09 ENCOUNTER — Ambulatory Visit (INDEPENDENT_AMBULATORY_CARE_PROVIDER_SITE_OTHER): Payer: BLUE CROSS/BLUE SHIELD | Admitting: Family Medicine

## 2014-12-09 VITALS — Ht 69.0 in | Wt 211.0 lb

## 2014-12-09 DIAGNOSIS — R748 Abnormal levels of other serum enzymes: Secondary | ICD-10-CM

## 2014-12-09 DIAGNOSIS — I714 Abdominal aortic aneurysm, without rupture, unspecified: Secondary | ICD-10-CM

## 2014-12-09 DIAGNOSIS — K760 Fatty (change of) liver, not elsewhere classified: Secondary | ICD-10-CM | POA: Diagnosis not present

## 2014-12-09 NOTE — Progress Notes (Signed)
   Subjective:    Patient ID: [name], male    DOB: Feb 18, 1950, 64 y.o.   MRN: [id number]  HPI  Patient arrives to discuss recent ultrasound results. Patient denies any abdominal pain. He relates he tries the healthy. Takes his medicine on a regular basis. Recent lab work showed elevated liver enzymes. Therefore ultrasound was ordered to look for fatty liver. Ultrasound showed fatty liver but also showed a left renal cyst and also a aortic aneurysm of significant size. Patient denies any abdominal pains denies any flank pains no symptoms of rupture. Review of Systems Patient does have shortness of breath with COPD otherwise no chest pressure or pain    Objective:   Physical Exam The patient's visit today was for discussion. 25 minutes spent with patient discussing multiple different issues. Greater than half in discussion and answering questions       Assessment & Plan:  Elevated liver enzymes repeat liver profile again in 4-6 months avoid all alcohol eat healthy continue medications  Left renal cyst no need for any intervention with this  Abdominal aneurysm of significant size needs urgent referral to vascular surgery more than likely they will repeat ultrasound may consider resection versus a vascular stenting  The patient was educated about signs and symptoms of aneurysm rupture

## 2014-12-22 ENCOUNTER — Encounter: Payer: Self-pay | Admitting: Vascular Surgery

## 2014-12-24 ENCOUNTER — Ambulatory Visit (INDEPENDENT_AMBULATORY_CARE_PROVIDER_SITE_OTHER): Payer: BLUE CROSS/BLUE SHIELD | Admitting: Vascular Surgery

## 2014-12-24 ENCOUNTER — Encounter: Payer: Self-pay | Admitting: Vascular Surgery

## 2014-12-24 VITALS — BP 146/90 | HR 90 | Temp 98.3°F | Resp 16 | Ht 69.0 in | Wt 213.0 lb

## 2014-12-24 DIAGNOSIS — I714 Abdominal aortic aneurysm, without rupture, unspecified: Secondary | ICD-10-CM

## 2014-12-24 NOTE — Progress Notes (Addendum)
[facility] [place] HISTORY AND PHYSICAL   History of Present Illness:  Patient is a 64 y.o. year old male who presents for evaluation of abdominal aortic aneurysm.  The patient has been asymptomatic. His aneurysm was discovered on a routine ultrasound for evaluation of elevated liver function tests. Current management for his elevated liver function tests involves weight loss and decrease of alcohol consumption. He currently drinks 3-5 beers per day. His family history is remarkable for his brother who had an abdominal aortic aneurysm repaired at age 68. Other medical problems include COPD, hyperlipidemia, hypertension all of which are currently stable. Dr. Gerda Diss and recommend the patient start taking an aspirin daily but so far he has not. He is a former smoker and quit 2004. He does have COPD but does not require home oxygen and can climb more than 2 flights of stairs without becoming short of breath.  Past Medical History  Diagnosis Date  . White coat hypertension   . COPD (chronic obstructive pulmonary disease) (HCC)   . Hyperlipidemia   . Hypertension     Past Surgical History  Procedure Laterality Date  . Appendectomy    . Colonoscopy  06/22/2010    [id number] rectal and right colon polyps removed remainder rectum and colon appared normal  . Ankle surgery Right 2009  . Colonoscopy with propofol N/A 05/15/2014    Procedure: COLONOSCOPY WITH PROPOFOL;  Surgeon: Corbin Ade, MD;  Location: [facility];  Service: Endoscopy;  Laterality: N/A;  In cecum @ [id number], out @ 0937, withdrawal time 19 minutes  . Polypectomy N/A 05/15/2014    Procedure: POLYPECTOMY;  Surgeon: Corbin Ade, MD;  Location: [facility];  Service: Endoscopy;  Laterality: N/A;    Social History Social History  Substance Use Topics  . Smoking status: Former Smoker -- 1.50 packs/day for 20 years    Types: Cigarettes    Quit date: 04/16/2002  . Smokeless tobacco: Never Used  . Alcohol Use: 0.0 oz/week     0 Standard drinks or equivalent per week     Comment: drinks up to 2 beers a day x 10 years    Family History Family History  Problem Relation Age of Onset  . Hypertension Sister   . Colon cancer Neg Hx     Allergies  No Known Allergies   Current Outpatient Prescriptions  Medication Sig Dispense Refill  . albuterol (VENTOLIN HFA) 108 (90 BASE) MCG/ACT inhaler INHALE TWO PUFFS BY MOUTH EVERY 4 HOURS AS NEEDED 18 each 5  . aspirin 81 MG tablet Take 81 mg by mouth daily.    [name] atorvastatin (LIPITOR) 40 MG tablet Take 1 tablet (40 mg total) by mouth daily. 30 tablet 5  . Calcium Carb-Cholecalciferol (CALCIUM + D3) 600-200 MG-UNIT TABS Take by mouth.    . fenofibrate 160 MG tablet Take 1 tablet (160 mg total) by mouth daily. 30 tablet 5  . fexofenadine (ALLEGRA) 180 MG tablet Take 180 mg by mouth daily.    . fish oil-omega-3 fatty acids 1000 MG capsule Take 2 g by mouth daily.    . Fluticasone-Salmeterol (ADVAIR DISKUS) 100-50 MCG/DOSE AEPB Inhale 1 puff into the lungs 2 (two) times daily. 60 each 5  . lisinopril (PRINIVIL,ZESTRIL) 20 MG tablet Take 0.5 tablets (10 mg total) by mouth daily. 30 tablet 5  . Magnesium 250 MG TABS Take 1 tablet by mouth daily.    . mometasone (NASONEX) 50 MCG/ACT nasal spray Place 2 sprays into the nose  daily. 48 g 3  . Multiple Vitamin (MULTIVITAMIN) tablet Take 1 tablet by mouth daily.    [name]. omeprazole (PRILOSEC) 20 MG capsule Take 20 mg by mouth daily.    [name]. tiotropium (SPIRIVA HANDIHALER) 18 MCG inhalation capsule INHALE ONE PUFF BY MOUTH ONCE DAILY 30 capsule 5  . vitamin C (ASCORBIC ACID) 500 MG tablet Take 500 mg by mouth daily.     No current facility-administered medications for this visit.    ROS:   General:  No weight loss, Fever, chills  HEENT: No recent headaches, no nasal bleeding, no visual changes, no sore throat  Neurologic: No dizziness, blackouts, seizures. No recent symptoms of stroke or mini- stroke. No recent episodes of  slurred speech, or temporary blindness.  Cardiac: No recent episodes of chest pain/pressure, no shortness of breath at rest.  + shortness of breath with exertion.  Denies history of atrial fibrillation or irregular heartbeat  Vascular: No history of rest pain in feet.  No history of claudication.  No history of non-healing ulcer, No history of DVT   Pulmonary: No home oxygen, no productive cough, no hemoptysis,  No asthma or wheezing  Musculoskeletal:  [ ]  Arthritis, [ ]  Low back pain,  [ ]  Joint pain  Hematologic:No history of hypercoagulable state.  No history of easy bleeding.  No history of anemia  Gastrointestinal: No hematochezia or melena,  No gastroesophageal reflux, no trouble swallowing  Urinary: [ ]  chronic Kidney disease, [ ]  on HD - [ ]  MWF or [ ]  TTHS, [ ]  Burning with urination, [ ]  Frequent urination, [ ]  Difficulty urinating;   Skin: No rashes  Psychological: No history of anxiety,  No history of depression   Physical Examination  Filed Vitals:   12/24/14 1230 12/24/14 1233  BP: 148/92 146/90  Pulse: 94 90  Temp: 98.3 F (36.8 C)   TempSrc: Oral   Resp: 16   Height: 5\' 9"  (1.753 m)   Weight: 213 lb (96.616 kg)   SpO2: 92%     Body mass index is 31.44 kg/(m^2).  General:  Alert and oriented, no acute distress HEENT: Normal Neck: No bruit or JVD Pulmonary: Clear to auscultation bilaterally Cardiac: Regular Rate and Rhythm without murmur Abdomen: Soft, non-tender, non-distended, no mass, obese Skin: No rash Extremity Pulses:  2+ radial, brachial, femoral, absent dorsalis pedis, posterior tibial pulses bilaterally Musculoskeletal: No deformity or edema  Neurologic: Upper and lower extremity motor 5/5 and symmetric  DATA: Ultrasound images reviewed today [facility]. This shows abdominal aortic aneurysm which is approximate 5 cm in diameter without evidence of rupture, liver shows fatty  infiltration   ASSESSMENT:  Asymptomatic 5 cm abdominal aortic aneurysm by ultrasound with family history of abdominal aortic aneurysm   PLAN:  CT Angio chest abdomen pelvis return for follow-up in 2 weeks to discuss the findings of these images. Consideration for repair of aneurysm is greater than 5 to 5-1/2 cm in diameter.  Also discussed with the patient today on Dr. Fletcher AnonLuking's recommendation starting aspirin daily  Fabienne Brunsharles Fields, MD [facility] [place] Office: [phone] Pager: [phone]

## 2014-12-24 NOTE — Addendum Note (Signed)
Addended by: Adria DillELDRIDGE-LEWIS, Avaley Coop L on: 12/24/2014 02:33 PM   Modules accepted: Orders

## 2015-01-09 ENCOUNTER — Encounter: Payer: Self-pay | Admitting: Vascular Surgery

## 2015-01-09 ENCOUNTER — Other Ambulatory Visit: Payer: Self-pay | Admitting: *Deleted

## 2015-01-09 DIAGNOSIS — Z01812 Encounter for preprocedural laboratory examination: Secondary | ICD-10-CM

## 2015-01-12 ENCOUNTER — Ambulatory Visit
Admission: RE | Admit: 2015-01-12 | Discharge: 2015-01-12 | Disposition: A | Discharge status: Home / Self Care | Payer: BLUE CROSS/BLUE SHIELD | Source: Ambulatory Visit | Attending: Vascular Surgery | Admitting: Vascular Surgery

## 2015-01-12 DIAGNOSIS — I714 Abdominal aortic aneurysm, without rupture, unspecified: Secondary | ICD-10-CM

## 2015-01-12 MED ORDER — IOPAMIDOL (ISOVUE-370) INJECTION 76%
75.0000 mL | Freq: Once | INTRAVENOUS | Status: AC | PRN
  Administered 2015-01-12: 75 mL via INTRAVENOUS

## 2015-01-14 ENCOUNTER — Ambulatory Visit (INDEPENDENT_AMBULATORY_CARE_PROVIDER_SITE_OTHER): Payer: BLUE CROSS/BLUE SHIELD | Admitting: Vascular Surgery

## 2015-01-14 ENCOUNTER — Encounter: Payer: Self-pay | Admitting: Vascular Surgery

## 2015-01-14 VITALS — BP 139/84 | HR 86 | Temp 97.9°F | Resp 18 | Ht 69.0 in | Wt 209.0 lb

## 2015-01-14 DIAGNOSIS — I714 Abdominal aortic aneurysm, without rupture, unspecified: Secondary | ICD-10-CM

## 2015-01-14 NOTE — Progress Notes (Signed)
[facility] [place] HISTORY AND PHYSICAL    History of Present Illness:  Patient is a 64 y.o.male who presents for evaluation of abdominal aortic aneurysm.  The patient has been asymptomatic. His aneurysm was discovered on a routine ultrasound for evaluation of elevated liver function tests. Current management for his elevated liver function tests involves weight loss and decrease of alcohol consumption. He currently drinks 3-5 beers per day. His family history is remarkable for his brother who had an abdominal aortic aneurysm repaired at age 62. Other medical problems include COPD, hyperlipidemia, hypertension all of which are currently stable. Dr. Gerda DissLuking recommend the patient start taking an aspirin daily but so far he has not. He is a former smoker and quit 2004. He does have COPD but does not require home oxygen and can climb more than 2 flights of stairs without becoming short of breath.   He returns today for follow-up after CT angiogram of the chest abdomen and pelvis.    Past Medical History   Diagnosis  Date   .  White coat hypertension     .  COPD (chronic obstructive pulmonary disease) (HCC)     .  Hyperlipidemia     .  Hypertension         Past Surgical History   Procedure  Laterality  Date   .  Appendectomy       .  Colonoscopy    06/22/2010       [id number]:Diminutive rectal and right colon polyps removed remainder rectum and colon appared normal   .  Ankle surgery  Right  2009   .  Colonoscopy with propofol  N/A  05/15/2014       Procedure: COLONOSCOPY WITH PROPOFOL;  Surgeon: Corbin Adeobert M Rourk, MD;  Location: [facility];  Service: Endoscopy;  Laterality: N/A;  In cecum @ [id number], out @ 0937, withdrawal time 19 minutes   .  Polypectomy  N/A  05/15/2014       Procedure: POLYPECTOMY;  Surgeon: Corbin Adeobert M Rourk, MD;  Location: [facility];  Service: Endoscopy;  Laterality: N/A;     Social History Social History   Substance Use Topics   .  Smoking status:  Former Smoker -- 1.50  packs/day for 20 years       Types:  Cigarettes       Quit date:  04/16/2002   .  Smokeless tobacco:  Never Used   .  Alcohol Use:  0.0 oz/week       0 Standard drinks or equivalent per week         Comment: drinks up to 2 beers a day x 10 years     Family History Family History   Problem  Relation  Age of Onset   .  Hypertension  Sister     .  Colon cancer  Neg Hx       Allergies  No Known Allergies     Current Outpatient Prescriptions   Medication  Sig  Dispense  Refill   .  albuterol (VENTOLIN HFA) 108 (90 BASE) MCG/ACT inhaler  INHALE TWO PUFFS BY MOUTH EVERY 4 HOURS AS NEEDED  18 each  5   .  aspirin 81 MG tablet  Take 81 mg by mouth daily.       [name].  atorvastatin (LIPITOR) 40 MG tablet  Take 1 tablet (40 mg total) by mouth daily.  30 tablet  5   .  Calcium Carb-Cholecalciferol (CALCIUM + D3)  600-200 MG-UNIT TABS  Take by mouth.       .  fenofibrate 160 MG tablet  Take 1 tablet (160 mg total) by mouth daily.  30 tablet  5   .  fexofenadine (ALLEGRA) 180 MG tablet  Take 180 mg by mouth daily.       .  fish oil-omega-3 fatty acids 1000 MG capsule  Take 2 g by mouth daily.       .  Fluticasone-Salmeterol (ADVAIR DISKUS) 100-50 MCG/DOSE AEPB  Inhale 1 puff into the lungs 2 (two) times daily.  60 each  5   .  lisinopril (PRINIVIL,ZESTRIL) 20 MG tablet  Take 0.5 tablets (10 mg total) by mouth daily.  30 tablet  5   .  Magnesium 250 MG TABS  Take 1 tablet by mouth daily.       .  mometasone (NASONEX) 50 MCG/ACT nasal spray  Place 2 sprays into the nose daily.  48 g  3   .  Multiple Vitamin (MULTIVITAMIN) tablet  Take 1 tablet by mouth daily.       [name]  omeprazole (PRILOSEC) 20 MG capsule  Take 20 mg by mouth daily.       [name]  tiotropium (SPIRIVA HANDIHALER) 18 MCG inhalation capsule  INHALE ONE PUFF BY MOUTH ONCE DAILY  30 capsule  5   .  vitamin C (ASCORBIC ACID) 500 MG tablet  Take 500 mg by mouth daily.          No current facility-administered medications for this visit.      ROS:    General:  No weight loss, Fever, chills  HEENT: No recent headaches, no nasal bleeding, no visual changes, no sore throat  Neurologic: No dizziness, blackouts, seizures. No recent symptoms of stroke or mini- stroke. No recent episodes of slurred speech, or temporary blindness.  Cardiac: No recent episodes of chest pain/pressure, no shortness of breath at rest.  + shortness of breath with exertion.  Denies history of atrial fibrillation or irregular heartbeat  Vascular: No history of rest pain in feet.  No history of claudication.  No history of non-healing ulcer, No history of DVT    Pulmonary: No home oxygen, no productive cough, no hemoptysis,  No asthma or wheezing  Musculoskeletal:   Arthritis,  Low back pain,   Joint pain  Hematologic:No history of hypercoagulable state.  No history of easy bleeding.  No history of anemia  Gastrointestinal: No hematochezia or melena,  No gastroesophageal reflux, no trouble swallowing  Urinary:  chronic Kidney disease,  on HD -  MWF or  TTHS,  Burning with urination,  Frequent urination,  Difficulty urinating;    Skin: No rashes  Psychological: No history of anxiety,  No history of depression   Physical Examination    Filed Vitals:   01/14/15 1536  BP: 139/84  Pulse: 86  Temp: 97.9 F (36.6 C)  Resp: 18  Height:  (1.753 m)  Weight: 209 lb (94.802 kg)  SpO2: 92%    Abdomen: Soft, non-tender, non-distended, no mass, obese Extremity Pulses:  2+ radial, brachial, femoral, absent dorsalis pedis, posterior tibial pulses bilaterally Musculoskeletal: No deformity or edema      DATA:  I reviewed the images of his recent CT angiogram. This showed a nodule in his right major fissure and recommended follow-up in one year no aneurysmal disease in the chest, infrarenal abdominal aortic aneurysm 4.7 cm in  diameter no iliac aneurysms   ASSESSMENT:  Asymptomatic 4.7 cm abdominal aortic aneurysm   asymptomatic   PLAN:   Follow-up abdominal aortic ultrasound in 6 months time most likely repeat CT in one year with repeat chest CT at that time for nodule.   Consideration for repair if aneurysm increases to larger than 5.5 cm. Patient was told to let any emergency room no in the course of her workup for abdominal or back pain that he has a known abdominal aortic aneurysm.  Fabienne Bruns, MD [facility] [place] Office: [phone] Pager: [phone]

## 2015-01-15 NOTE — Addendum Note (Signed)
Addended by: Melodye PedMANESS-HARRISON, Chonte Ricke C on: 01/15/2015 01:21 PM   Modules accepted: Orders

## 2015-01-29 ENCOUNTER — Ambulatory Visit (INDEPENDENT_AMBULATORY_CARE_PROVIDER_SITE_OTHER): Payer: BLUE CROSS/BLUE SHIELD | Admitting: Family Medicine

## 2015-01-29 ENCOUNTER — Encounter: Payer: Self-pay | Admitting: Family Medicine

## 2015-01-29 VITALS — BP 130/90 | Temp 98.2°F | Ht 69.0 in | Wt 208.4 lb

## 2015-01-29 DIAGNOSIS — I10 Essential (primary) hypertension: Secondary | ICD-10-CM | POA: Diagnosis not present

## 2015-01-29 DIAGNOSIS — J441 Chronic obstructive pulmonary disease with (acute) exacerbation: Secondary | ICD-10-CM

## 2015-01-29 MED ORDER — HYDROCODONE-HOMATROPINE 5-1.5 MG/5ML PO SYRP: 5.0000 mL | ORAL_SOLUTION | Freq: Every evening | ORAL | Status: DC | PRN

## 2015-01-29 MED ORDER — LISINOPRIL 20 MG PO TABS: 20.0000 mg | ORAL_TABLET | Freq: Every day | ORAL | Status: DC

## 2015-01-29 MED ORDER — AMOXICILLIN-POT CLAVULANATE 875-125 MG PO TABS: 1.0000 | ORAL_TABLET | Freq: Two times a day (BID) | ORAL | Status: DC

## 2015-01-29 NOTE — Progress Notes (Signed)
   Subjective:    Patient ID: [name], male    DOB: [birth year], 64 y.o.   MRN: [id number]  Cough This is a new problem. The current episode started in the past 7 days. The problem occurs every few hours. The cough is non-productive. Associated symptoms include headaches and wheezing. Associated symptoms comments: Congestion, runny nose. Nothing aggravates the symptoms. Treatments tried: Catering managerAlka Seltzer. The treatment provided no relief.   Patient is also substantial elevation of blood pressure. Was on 10 mg lisinopril. Was prescribed one half of a 20. Was not sure whether to go to 20. He went up to 21 most of his diastolics were above 90 next   Cong Sunday positive history of COPD  More sob, tight and wheezy at times  Advair and spirib a faith fully   Review of Systems  Respiratory: Positive for cough and wheezing.   Neurological: Positive for headaches.       Objective:   Physical Exam Alert blood pressure 134/90. HEENT moderate nasal congestion trace normal lungs diminished breath sounds diffusely no acute wheezes no crackles intermittent cough heart regular in rhythm       Assessment & Plan:  Impression 1 exacerbation of COPD discussed #2 hypertension discussed with patient self increasing med, advise this time it appears to work better future call before increasing plan maintain 20 mg lisinopril. Albuterol when necessary. Antibiotics prescribed. Warning signs discussed carefully WSL

## 2015-05-01 ENCOUNTER — Telehealth: Payer: Self-pay | Admitting: Family Medicine

## 2015-05-01 DIAGNOSIS — I10 Essential (primary) hypertension: Secondary | ICD-10-CM

## 2015-05-01 DIAGNOSIS — Z79899 Other long term (current) drug therapy: Secondary | ICD-10-CM

## 2015-05-01 DIAGNOSIS — R7303 Prediabetes: Secondary | ICD-10-CM

## 2015-05-01 DIAGNOSIS — E785 Hyperlipidemia, unspecified: Secondary | ICD-10-CM

## 2015-05-01 NOTE — Telephone Encounter (Signed)
Pt is requesting lab orders to be sent over for an upcoming wellness visit. Last labs per epic were: lipid,hepatic,bmp,a1c and psa on 11/24/14

## 2015-05-01 NOTE — Telephone Encounter (Signed)
Orders ready. Pt notified.  

## 2015-05-01 NOTE — Telephone Encounter (Signed)
Lipid, liver, metabolic 7, hemoglobin A1c to do these in April before office visit

## 2015-05-19 LAB — BASIC METABOLIC PANEL
BUN/Creatinine Ratio: 11 (ref 10–24)
BUN: 9 mg/dL (ref 8–27)
CO2: 24 mmol/L (ref 18–29)
Calcium: 10.3 mg/dL — ABNORMAL HIGH (ref 8.6–10.2)
Chloride: 103 mmol/L (ref 96–106)
Creatinine, Ser: 0.85 mg/dL (ref 0.76–1.27)
GFR calc Af Amer: 107 mL/min/{1.73_m2} (ref >59)
GFR calc non Af Amer: 93 mL/min/{1.73_m2} (ref >59)
Glucose: 90 mg/dL (ref 65–99)
Potassium: 5.1 mmol/L (ref 3.5–5.2)
Sodium: 144 mmol/L (ref 134–144)

## 2015-05-19 LAB — HEPATIC FUNCTION PANEL
ALT: 65 IU/L — ABNORMAL HIGH (ref 0–44)
AST: 53 IU/L — ABNORMAL HIGH (ref 0–40)
Albumin: 4.4 g/dL (ref 3.6–4.8)
Alkaline Phosphatase: 41 IU/L (ref 39–117)
Bilirubin Total: 0.4 mg/dL (ref 0.0–1.2)
Bilirubin, Direct: 0.2 mg/dL (ref 0.00–0.40)
Total Protein: 7.1 g/dL (ref 6.0–8.5)

## 2015-05-19 LAB — LIPID PANEL
Chol/HDL Ratio: 3 ratio units (ref 0.0–5.0)
Cholesterol, Total: 128 mg/dL (ref 100–199)
HDL: 43 mg/dL (ref >39)
LDL Calculated: 70 mg/dL (ref 0–99)
Triglycerides: 74 mg/dL (ref 0–149)
VLDL Cholesterol Cal: 15 mg/dL (ref 5–40)

## 2015-05-19 LAB — HEMOGLOBIN A1C
Est. average glucose Bld gHb Est-mCnc: 123 mg/dL
Hgb A1c MFr Bld: 5.9 % — ABNORMAL HIGH (ref 4.8–5.6)

## 2015-06-01 ENCOUNTER — Encounter: Payer: Self-pay | Admitting: Family Medicine

## 2015-06-01 ENCOUNTER — Ambulatory Visit (INDEPENDENT_AMBULATORY_CARE_PROVIDER_SITE_OTHER): Payer: BLUE CROSS/BLUE SHIELD | Admitting: Family Medicine

## 2015-06-01 VITALS — BP 148/94 | Ht 69.0 in | Wt 195.0 lb

## 2015-06-01 DIAGNOSIS — R7303 Prediabetes: Secondary | ICD-10-CM

## 2015-06-01 DIAGNOSIS — R748 Abnormal levels of other serum enzymes: Secondary | ICD-10-CM | POA: Diagnosis not present

## 2015-06-01 DIAGNOSIS — E785 Hyperlipidemia, unspecified: Secondary | ICD-10-CM | POA: Diagnosis not present

## 2015-06-01 DIAGNOSIS — I10 Essential (primary) hypertension: Secondary | ICD-10-CM | POA: Diagnosis not present

## 2015-06-01 DIAGNOSIS — K760 Fatty (change of) liver, not elsewhere classified: Secondary | ICD-10-CM

## 2015-06-01 MED ORDER — LISINOPRIL-HYDROCHLOROTHIAZIDE 20-12.5 MG PO TABS
1.0000 | ORAL_TABLET | Freq: Every day | ORAL | Status: DC
Start: 2015-06-01 — End: 2015-11-30

## 2015-06-01 NOTE — Progress Notes (Signed)
Subjective:    Patient ID: [name], male    DOB: 09/30/1950, 65 y.o.   MRN: [id number]  Hyperlipidemia This is a chronic problem. The current episode started more than 1 year ago. Pertinent negatives include no chest pain. There are no compliance problems (tries to eat healhy and walks when weather is good).   A1C done on bloodwork. 05/18/15  Results for orders placed or performed in visit on 05/01/15  Lipid panel  Result Value Ref Range   Cholesterol, Total 128 100 - 199 mg/dL   Triglycerides 74 0 - 149 mg/dL   HDL 43 >11>39 mg/dL   VLDL Cholesterol Cal 15 5 - 40 mg/dL   LDL Calculated 70 0 - 99 mg/dL   Chol/HDL Ratio 3.0 0.0 - 5.0 ratio units  Hepatic function panel  Result Value Ref Range   Total Protein 7.1 6.0 - 8.5 g/dL   Albumin 4.4 3.6 - 4.8 g/dL   Bilirubin Total 0.4 0.0 - 1.2 mg/dL   Bilirubin, Direct 9.140.20 0.00 - 0.40 mg/dL   Alkaline Phosphatase 41 39 - 117 IU/L   AST 53 (H) 0 - 40 IU/L   ALT 65 (H) 0 - 44 IU/L  Basic metabolic panel  Result Value Ref Range   Glucose 90 65 - 99 mg/dL   BUN 9 8 - 27 mg/dL   Creatinine, Ser 7.820.85 0.76 - 1.27 mg/dL   GFR calc non Af Amer 93 >59 mL/min/1.73   GFR calc Af Amer 107 >59 mL/min/1.73   BUN/Creatinine Ratio 11 10 - 24   Sodium 144 134 - 144 mmol/L   Potassium 5.1 3.5 - 5.2 mmol/L   Chloride 103 96 - 106 mmol/L   CO2 24 18 - 29 mmol/L   Calcium 10.3 (H) 8.6 - 10.2 mg/dL  Hemoglobin N5AA1c  Result Value Ref Range   Hgb A1c MFr Bld 5.9 (H) 4.8 - 5.6 %   Est. average glucose Bld gHb Est-mCnc 123 mg/dL   Time was spent today discussing his breathing he states is doing better since he's been exercising he also taking his cholesterol medicine eating healthier losing weight he relates allergies and been under good control. He states his blood pressure medicine time is under good control but occasionally is elevated. Pt states no concerns today.   Review of Systems  Constitutional: Negative for activity change, appetite  change and fatigue.  HENT: Negative for congestion.   Respiratory: Negative for cough.   Cardiovascular: Negative for chest pain.  Gastrointestinal: Negative for abdominal pain.  Endocrine: Negative for polydipsia and polyphagia.  Neurological: Negative for weakness.  Psychiatric/Behavioral: Negative for confusion.       Objective:   Physical Exam  Constitutional: He appears well-nourished. No distress.  Cardiovascular: Normal rate, regular rhythm and normal heart sounds.   No murmur heard. Pulmonary/Chest: Effort normal and breath sounds normal. No respiratory distress.  Musculoskeletal: He exhibits no edema.  Lymphadenopathy:    He has no cervical adenopathy.  Neurological: He is alert.  Psychiatric: His behavior is normal.  Vitals reviewed.    25 minutes was spent with the patient. Greater than half the time was spent in discussion and answering questions and counseling regarding the issues that the patient came in for today.      Assessment & Plan:  HTN subpar control add diuretic to his medication watch closely patient needs to monitor his blood pressures as he has in send those readings to us  COPD stable continue current  measures  Aortic aneurysm he will be getting a follow-up ultrasound in June with follow-up with the vascular surgeon we talked about the best way to try to minimize growth of this is keeping blood pressure another risk factors under control  Fatty liver will need additional lab work rationale discuss review of recent lab work was done importance of losing weight next sizing and healthy diet recommended  Hyperlipidemia stop fenofibrate continue cholesterol medicine recheck profile in several months time  Prediabetes importance of minimizing sugars watching diet and continuing to lose weight as he has

## 2015-06-02 LAB — ANA: Anti Nuclear Antibody(ANA): NEGATIVE

## 2015-06-02 LAB — IRON AND TIBC
Iron Saturation: 16 % (ref 15–55)
Iron: 89 ug/dL (ref 38–169)
Total Iron Binding Capacity: 555 ug/dL (ref 250–450)
UIBC: 466 ug/dL — ABNORMAL HIGH (ref 111–343)

## 2015-06-02 LAB — HEPATITIS C ANTIBODY: Hep C Virus Ab: <0.1 s/co ratio (ref 0.0–0.9)

## 2015-06-02 LAB — FERRITIN: Ferritin: 298 ng/mL (ref 30–400)

## 2015-06-02 LAB — CERULOPLASMIN: Ceruloplasmin: 30.6 mg/dL (ref 16.0–31.0)

## 2015-06-02 LAB — HEPATITIS B SURFACE ANTIGEN: Hepatitis B Surface Ag: NEGATIVE

## 2015-06-11 ENCOUNTER — Telehealth: Payer: Self-pay | Admitting: Family Medicine

## 2015-06-11 DIAGNOSIS — D649 Anemia, unspecified: Secondary | ICD-10-CM

## 2015-06-11 DIAGNOSIS — D509 Iron deficiency anemia, unspecified: Secondary | ICD-10-CM

## 2015-06-11 DIAGNOSIS — K760 Fatty (change of) liver, not elsewhere classified: Secondary | ICD-10-CM

## 2015-06-11 NOTE — Telephone Encounter (Signed)
Dr Lorin Picketscott spoke with leslie at [facility]

## 2015-06-11 NOTE — Telephone Encounter (Signed)
Please let me speak with one of the assistant doctors/PAs/nurse practitioners at [facility]

## 2015-06-11 NOTE — Telephone Encounter (Signed)
I spoke with the patient regarding the results of his tests. I recommend to repeat ferritin, TIBC, CBC-diagnosis iron deficient anemia also check liver profile diagnosis fatty liver patient will be doing this lab work either Friday or Saturday thank you(based upon these results more than likely will be setting up appointment with gastroenterology and possibly hematology)

## 2015-06-11 NOTE — Telephone Encounter (Signed)
Patient called due to reviewing labs drawn on 06/01/15. Patient stated that he saw the TIBC results were abnormal and would like to know what your thoughts are

## 2015-06-12 NOTE — Telephone Encounter (Signed)
Orders for blood work in [facility].

## 2015-06-12 NOTE — Addendum Note (Signed)
Addended by: Jeralene PetersREWS, Mazin Emma R on: 06/12/2015 09:38 AM   Modules accepted: Orders

## 2015-06-16 LAB — CBC WITH DIFFERENTIAL/PLATELET
Basophils Absolute: 0 10*3/uL (ref 0.0–0.2)
Basos: 0 %
EOS (ABSOLUTE): 0.2 10*3/uL (ref 0.0–0.4)
Eos: 2 %
Hematocrit: 47.9 % (ref 37.5–51.0)
Hemoglobin: 15.8 g/dL (ref 12.6–17.7)
Immature Grans (Abs): 0 10*3/uL (ref 0.0–0.1)
Immature Granulocytes: 0 %
Lymphocytes Absolute: 1.8 10*3/uL (ref 0.7–3.1)
Lymphs: 24 %
MCH: 30.4 pg (ref 26.6–33.0)
MCHC: 33 g/dL (ref 31.5–35.7)
MCV: 92 fL (ref 79–97)
Monocytes Absolute: 0.7 10*3/uL (ref 0.1–0.9)
Monocytes: 10 %
Neutrophils Absolute: 4.7 10*3/uL (ref 1.4–7.0)
Neutrophils: 64 %
Platelets: 287 10*3/uL (ref 150–379)
RBC: 5.19 x10E6/uL (ref 4.14–5.80)
RDW: 14.9 % (ref 12.3–15.4)
WBC: 7.4 10*3/uL (ref 3.4–10.8)

## 2015-06-16 LAB — HEPATIC FUNCTION PANEL
ALT: 78 IU/L — ABNORMAL HIGH (ref 0–44)
AST: 63 IU/L — ABNORMAL HIGH (ref 0–40)
Albumin: 4.4 g/dL (ref 3.6–4.8)
Alkaline Phosphatase: 53 IU/L (ref 39–117)
Bilirubin Total: 0.4 mg/dL (ref 0.0–1.2)
Bilirubin, Direct: 0.2 mg/dL (ref 0.00–0.40)
Total Protein: 6.9 g/dL (ref 6.0–8.5)

## 2015-06-16 LAB — IRON AND TIBC
Iron Saturation: 10 % — ABNORMAL LOW (ref 15–55)
Iron: 48 ug/dL (ref 38–169)
Total Iron Binding Capacity: 502 ug/dL — ABNORMAL HIGH (ref 250–450)
UIBC: 454 ug/dL — ABNORMAL HIGH (ref 111–343)

## 2015-06-16 LAB — FERRITIN: Ferritin: 243 ng/mL (ref 30–400)

## 2015-06-19 NOTE — Addendum Note (Signed)
Addended by: Jeralene PetersREWS, Zen Felling R on: 06/19/2015 09:27 AM   Modules accepted: Orders

## 2015-06-22 ENCOUNTER — Encounter: Payer: Self-pay | Admitting: Internal Medicine

## 2015-06-22 ENCOUNTER — Encounter: Payer: Self-pay | Admitting: Family Medicine

## 2015-06-24 ENCOUNTER — Other Ambulatory Visit: Payer: Self-pay | Admitting: Family Medicine

## 2015-06-29 ENCOUNTER — Other Ambulatory Visit: Payer: Self-pay | Admitting: Family Medicine

## 2015-06-30 ENCOUNTER — Other Ambulatory Visit: Payer: Self-pay

## 2015-06-30 DIAGNOSIS — D649 Anemia, unspecified: Secondary | ICD-10-CM

## 2015-06-30 DIAGNOSIS — D509 Iron deficiency anemia, unspecified: Secondary | ICD-10-CM

## 2015-06-30 LAB — POC HEMOCCULT BLD/STL (HOME/3-CARD/SCREEN)
Card #2 Fecal Occult Blod, POC: NEGATIVE
Card #3 Fecal Occult Blood, POC: NEGATIVE
Fecal Occult Blood, POC: NEGATIVE

## 2015-07-10 ENCOUNTER — Encounter: Payer: Self-pay | Admitting: Vascular Surgery

## 2015-07-13 ENCOUNTER — Ambulatory Visit (INDEPENDENT_AMBULATORY_CARE_PROVIDER_SITE_OTHER): Payer: BLUE CROSS/BLUE SHIELD | Admitting: Nurse Practitioner

## 2015-07-13 ENCOUNTER — Encounter: Payer: Self-pay | Admitting: Nurse Practitioner

## 2015-07-13 VITALS — BP 165/89 | HR 79 | Temp 97.0°F | Ht 69.0 in | Wt 188.2 lb

## 2015-07-13 DIAGNOSIS — K760 Fatty (change of) liver, not elsewhere classified: Secondary | ICD-10-CM

## 2015-07-13 DIAGNOSIS — R748 Abnormal levels of other serum enzymes: Secondary | ICD-10-CM

## 2015-07-13 NOTE — Progress Notes (Signed)
CC'D TO PCP °

## 2015-07-13 NOTE — Patient Instructions (Addendum)
1. We will request records of your ultrasound that is scheduled for this Thursday. 2. Have your labs drawn when you're able to. 3. Continue the diet and exercise you have been doing so well with lately. 4. Return for follow-up in 3 months.

## 2015-07-13 NOTE — Assessment & Plan Note (Signed)
We will recheck hepatic function panel today. Return for follow-up in 3 months.

## 2015-07-13 NOTE — Assessment & Plan Note (Addendum)
Patient with elevated LFTs and abdominal imaging last year to suggest fatty liver. Autoimmune workup, hepatitis B, hepatitis C negative. Has a history of hyperlipidemia, hypertension, prediabetes based on hemoglobin A1c blood work. Last lipid panel looked much improved on Lipitor. He has started a regimen of diet and exercise beginning of the year and is lost 20-25 pounds. Diet is much improved, avoiding alcohol, fatty/fried foods, sugar/starches. He does have an ultrasound scheduled by vascular and wein specialist to reevaluate aortic aneurysm this Thursday and we will request a record of this to reevaluate liver parenchyma for any changes. We'll reorder hepatic function panel today. Recommended continue diet and exercise. Return for follow-up in 3 months.  Of note, studies are mixed on the efficacy of Metformin to improve fatty liver disease, although a theoretical link is proposed. Overall recommendation is to not use metformin solely for the treatment of NAFLD. However, should his hemoglobin A1c continue to climb metformin could be utilitarian and controlling his blood sugar, preventing worsening fatty liver disease and progression.  towards cirrhosis.

## 2015-07-13 NOTE — Progress Notes (Signed)
Referring Provider: [name], MD Primary Care Physician:  Lilyan Punt, MD Primary GI:  Dr. Jena Gauss  Chief Complaint  Patient presents with  . fatty liver    HPI:   [name] is a 65 y.o. male who presents on referral from primary care for iron deficiency anemia and fatty liver. Anemia was worked up by primary care with CBC on 06/13/2015 showing normal hemoglobin of 15.8, ferritin normal at 243, low iron saturation at 10%, high total iron-binding capacity of 502. PCP notes likely early iron deficiency anemia. Heme stool card negative for blood. Patient denies seeing blood in his stool. They're continuing to monitor this at this time and not recommending any further follow-up for right now.   They are requesting further evaluation of likely fatty liver. He has had a slowly increasing level of AST/ALT over the past year from completely normal at 31/23 to a most recent value of 63/78 one month ago. Primary care has already checked ceruloplasmin, ANA, hepatitis B, hepatitis C and all were normal/negative. Review of other labs show normal lipid panel (on statin), slightly elevated hemoglobin A1c at 5.9 one month ago, poorly controlled hypertension. Has a history of hypertension, hyperlipidemia and likely prediabetes (based on A1c). This history suggests early/high risk for metabolic syndrome. Most recent abdominal imaging includes CT angioma of the abdomen and pelvis with and without contrast on 01/12/2015 which noted normal hepatic contour without discrete hepatic lesions. Abdominal ultrasound ordered 12/08/2014 for elevated LFTs found no gallstones, normal CBD diameter, increased echogenicity of the liver consistent with fatty infiltration.  Today he states he's feeling well overall. Has lost 20-25 pounds since the first of the year. He has been walking about a mile and a half a day since March. Is on Lipitor. He is monitoring his BP at home, has been pretty good over the last 2-3  weeks 120s-130s/70s. He diet has improved significantly, avoiding fatty foods, limiting excessive starches, sugary foods. Limits ETOH intake. Denies abdominal pain, N/V, hematochezia, melena, unintentional weight loss, fever, chills. Denies yellowing of eyes/skin, darkened urine, acute episodic confusion, tremors. Denies chest pain, worsening dyspnea, dizziness, lightheadedness, syncope, near syncope. Denies any other upper or lower GI symptoms.  Past Medical History  Diagnosis Date  . White coat hypertension   . COPD (chronic obstructive pulmonary disease) (HCC)   . Hyperlipidemia   . Hypertension   . AAA (abdominal aortic aneurysm) (HCC)   . Fatty liver     Past Surgical History  Procedure Laterality Date  . Appendectomy    . Colonoscopy  06/22/2010    [id number] rectal and right colon polyps removed remainder rectum and colon appared normal  . Ankle surgery Right 2009  . Colonoscopy with propofol N/A 05/15/2014    Procedure: COLONOSCOPY WITH PROPOFOL;  Surgeon: Corbin Ade, MD;  Location: [facility];  Service: Endoscopy;  Laterality: N/A;  In cecum @ [id number], out @ 0937, withdrawal time 19 minutes  . Polypectomy N/A 05/15/2014    Procedure: POLYPECTOMY;  Surgeon: Corbin Ade, MD;  Location: [facility];  Service: Endoscopy;  Laterality: N/A;    Current Outpatient Prescriptions  Medication Sig Dispense Refill  . ADVAIR DISKUS 100-50 MCG/DOSE AEPB INHALE 1 DOSE BY MOUTH TWICE DAILY 60 each 5  . albuterol (VENTOLIN HFA) 108 (90 BASE) MCG/ACT inhaler INHALE TWO PUFFS BY MOUTH EVERY 4 HOURS AS NEEDED 18 each 5  . aspirin 81 MG tablet Take 81 mg by mouth daily.    [name]  atorvastatin (LIPITOR) 40 MG tablet Take 1 tablet (40 mg total) by mouth daily. 30 tablet 5  . fexofenadine (ALLEGRA) 180 MG tablet Take 180 mg by mouth daily.    . fish oil-omega-3 fatty acids 1000 MG capsule Take 2 g by mouth daily.    [name]. lisinopril-hydrochlorothiazide (ZESTORETIC) 20-12.5 MG tablet Take 1 tablet by mouth daily. 30  tablet 5  . mometasone (NASONEX) 50 MCG/ACT nasal spray Place 2 sprays into the nose daily. 48 g 3  . Multiple Vitamin (MULTIVITAMIN) tablet Take 1 tablet by mouth daily.    [name]. SPIRIVA HANDIHALER 18 MCG inhalation capsule INHALE 1 DOSE ONCE DAILY 30 capsule 5  . vitamin C (ASCORBIC ACID) 500 MG tablet Take 500 mg by mouth daily. Reported on 07/13/2015     No current facility-administered medications for this visit.    Allergies as of 07/13/2015  . (No Known Allergies)    Family History  Problem Relation Age of Onset  . Hypertension Sister   . Colon cancer Neg Hx     Social History   Social History  . Marital Status: Married    Spouse Name: N/A  . Number of Children: N/A  . Years of Education: N/A   Social History Main Topics  . Smoking status: Former Smoker -- 1.50 packs/day for 20 years    Types: Cigarettes    Quit date: 04/16/2002  . Smokeless tobacco: Never Used  . Alcohol Use: 0.0 oz/week    0 Standard drinks or equivalent per week     Comment: On average drinks 1-2 a week (beer). Previously: drank up to 2 beers a day x 10 years  . Drug Use: No  . Sexual Activity: No   Other Topics Concern  . None   Social History Narrative    Review of Systems: General: Negative for anorexia, unintentional weight loss, fever, chills, fatigue, weakness. ENT: Negative for hoarseness, difficulty swallowing. CV: Negative for chest pain, angina, palpitations, peripheral edema.  Respiratory: Negative for dyspnea at rest; Negative for worsening dyspnea on exertion, cough, sputum, wheezing.  GI: See history of present illness. Derm: Negative for rash or itching.  Neuro: Negative for memory loss, confusion.  Endo: Negative for unusual weight change.  Heme: Negative for bruising or bleeding. Allergy: Negative for rash or hives.   Physical Exam: BP 165/89 mmHg  Pulse 79  Temp(Src) 97 F (36.1 C)  Ht 5\' 9"  (1.753 m)  Wt 188 lb 3.2 oz (85.367 kg)  BMI 27.78 kg/m2 General:   Alert  and oriented. Pleasant and cooperative. Well-nourished and well-developed.  Head:  Normocephalic and atraumatic. Eyes:  Without icterus, sclera clear and conjunctiva pink.  Ears:  Normal auditory acuity. Cardiovascular:  S1, S2 present without murmurs appreciated.  Extremities without clubbing or edema. Respiratory:  Clear to auscultation bilaterally. No wheezes, rales, or rhonchi. No distress.  Gastrointestinal:  +BS, rounded but soft, non-tender and non-distended. No HSM noted. No guarding or rebound. No masses appreciated.  Rectal:  Deferred  Skin:  Intact without significant lesions or rashes. Neurologic:  Alert and oriented x4;  grossly normal neurologically. Psych:  Alert and cooperative. Normal mood and affect. Heme/Lymph/Immune: No excessive bruising noted.    07/13/2015 12:02 PM   Disclaimer: This note was dictated with voice recognition software. Similar sounding words can inadvertently be transcribed and may not be corrected upon review.

## 2015-07-16 ENCOUNTER — Ambulatory Visit (INDEPENDENT_AMBULATORY_CARE_PROVIDER_SITE_OTHER): Payer: BLUE CROSS/BLUE SHIELD | Admitting: Vascular Surgery

## 2015-07-16 ENCOUNTER — Encounter: Payer: Self-pay | Admitting: Vascular Surgery

## 2015-07-16 ENCOUNTER — Ambulatory Visit (HOSPITAL_COMMUNITY)
Admission: RE | Admit: 2015-07-16 | Discharge: 2015-07-16 | Disposition: A | Discharge status: Home / Self Care | Payer: BLUE CROSS/BLUE SHIELD | Source: Ambulatory Visit | Attending: Vascular Surgery | Admitting: Vascular Surgery

## 2015-07-16 VITALS — BP 176/89 | HR 66 | Ht 69.0 in | Wt 187.0 lb

## 2015-07-16 DIAGNOSIS — I714 Abdominal aortic aneurysm, without rupture, unspecified: Secondary | ICD-10-CM

## 2015-07-16 DIAGNOSIS — I10 Essential (primary) hypertension: Secondary | ICD-10-CM | POA: Insufficient documentation

## 2015-07-16 DIAGNOSIS — E785 Hyperlipidemia, unspecified: Secondary | ICD-10-CM | POA: Insufficient documentation

## 2015-07-16 DIAGNOSIS — I7409 Other arterial embolism and thrombosis of abdominal aorta: Secondary | ICD-10-CM | POA: Diagnosis not present

## 2015-07-16 NOTE — Progress Notes (Signed)
[facility] [place] HISTORY AND PHYSICAL    History of Present Illness:  Patient is a 65 y.o.male who presents for evaluation of abdominal aortic aneurysm.  The patient has been asymptomatic. His aneurysm was discovered on a routine ultrasound for evaluation of elevated liver function tests. His family history is remarkable for his brother who had an abdominal aortic aneurysm repaired at age 72. Other medical problems include COPD, hyperlipidemia, hypertension all of which are currently stable. He is a former smoker and quit 2004. He does have COPD but does not require home oxygen and can climb more than 2 flights of stairs without becoming short of breath.   He returns today for follow-up.  His aneurysm was 4.7 cm in diameter on previous CT scan while ultrasound at that time showed it to be 5.3 cm.    Past Medical History    Diagnosis   Date    .   White coat hypertension       .   COPD (chronic obstructive pulmonary disease) (HCC)       .   Hyperlipidemia       .   Hypertension           Past Surgical History    Procedure   Laterality   Date    .   Appendectomy          .   Colonoscopy      06/22/2010          [id number] rectal and right colon polyps removed remainder rectum and colon appared normal    .   Ankle surgery   Right   2009    .   Colonoscopy with propofol   N/A   05/15/2014          Procedure: COLONOSCOPY WITH PROPOFOL;  Surgeon: Corbin Ade, MD;  Location: [facility];  Service: Endoscopy;  Laterality: N/A;  In cecum @ [id number], out @ 0937, withdrawal time 19 minutes    .   Polypectomy   N/A   05/15/2014          Procedure: POLYPECTOMY;  Surgeon: Corbin Ade, MD;  Location: [facility];  Service: Endoscopy;  Laterality: N/A;      Social History Social History    Substance Use Topics    .   Smoking status:   Former Smoker -- 1.50 packs/day for 20 years          Types:   Cigarettes          Quit date:   04/16/2002    .   Smokeless tobacco:   Never Used    .    Alcohol Use:   0.0 oz/week          0 Standard drinks or equivalent per week             Comment: drinks up to 2 beers a day x 10 years      Family History Family History    Problem   Relation   Age of Onset    .   Hypertension   Sister       .   Colon cancer   Neg Hx         Allergies  No Known Allergies Current Outpatient Prescriptions on File Prior to Visit  Medication Sig Dispense Refill  . ADVAIR DISKUS 100-50 MCG/DOSE AEPB INHALE 1 DOSE BY MOUTH TWICE DAILY 60 each 5  . albuterol (VENTOLIN HFA)  108 (90 BASE) MCG/ACT inhaler INHALE TWO PUFFS BY MOUTH EVERY 4 HOURS AS NEEDED 18 each 5  . aspirin 81 MG tablet Take 81 mg by mouth daily.    [name]. atorvastatin (LIPITOR) 40 MG tablet Take 1 tablet (40 mg total) by mouth daily. 30 tablet 5  . fexofenadine (ALLEGRA) 180 MG tablet Take 180 mg by mouth daily.    . fish oil-omega-3 fatty acids 1000 MG capsule Take 2 g by mouth daily.    [name]. lisinopril-hydrochlorothiazide (ZESTORETIC) 20-12.5 MG tablet Take 1 tablet by mouth daily. 30 tablet 5  . mometasone (NASONEX) 50 MCG/ACT nasal spray Place 2 sprays into the nose daily. 48 g 3  . Multiple Vitamin (MULTIVITAMIN) tablet Take 1 tablet by mouth daily.    [name]. SPIRIVA HANDIHALER 18 MCG inhalation capsule INHALE 1 DOSE ONCE DAILY 30 capsule 5  . vitamin C (ASCORBIC ACID) 500 MG tablet Take 500 mg by mouth daily. Reported on 07/16/2015     No current facility-administered medications on file prior to visit.    ROS:     Neurologic: No dizziness, blackouts, seizures. No recent symptoms of stroke or mini- stroke. No recent episodes of slurred speech, or temporary blindness.  Cardiac: No recent episodes of chest pain/pressure, no shortness of breath at rest.  + shortness of breath with exertion.  Denies history of atrial fibrillation or irregular heartbeat  Vascular: No history of rest pain in feet.  No history of claudication.  No history of non-healing ulcer, No history of DVT     Physical  Examination    Filed Vitals:   07/16/15 0836 07/16/15 0838  BP: 160/100 176/89  Pulse: 66   Height: 5\' 9"  (1.753 m)   Weight: 187 lb (84.823 kg)   SpO2: 95%    Abdomen: Soft, non-tender, non-distended, no mass, obese Extremity Pulses:  2+ radial, brachial, femoral, absent dorsalis pedis, posterior tibial pulses bilaterally Musculoskeletal: No deformity or edema      DATA: Aortic ultrasound today shows aneurysm diameter 5.3 cm  ASSESSMENT:  Asymptomatic 4.7 cm abdominal aortic aneurysm by CT 6 months ago which correlated to 5.3 cm on ultrasound. His ultrasound today again is 5.3 cm which I would suspect is unchanged with a CT showing 4.7 cm if we repeated it.      PLAN:   Follow-up abdominal aortic ultrasound in 6 months time. Consideration for repair if aneurysm increases to larger than 5.5 cm. Patient was told to let any emergency room no in the course of her workup for abdominal or back pain that he has a known abdominal aortic aneurysm.  Patient with history of chest nodule. I will leave further workup and management of following this at Dr. Fletcher AnonLuking's discretion.  Fabienne Brunsharles Kyliah Deanda, MD [facility] [place] Office: [phone] Pager: [phone]

## 2015-07-28 ENCOUNTER — Telehealth: Payer: Self-pay | Admitting: Family Medicine

## 2015-07-28 LAB — HEPATIC FUNCTION PANEL
ALT: 36 U/L (ref 9–46)
AST: 30 U/L (ref 10–35)
Albumin: 4.2 g/dL (ref 3.6–5.1)
Alkaline Phosphatase: 62 U/L (ref 40–115)
Bilirubin, Direct: 0.2 mg/dL (ref <=0.2)
Indirect Bilirubin: 0.5 mg/dL (ref 0.2–1.2)
Total Bilirubin: 0.7 mg/dL (ref 0.2–1.2)
Total Protein: 6.8 g/dL (ref 6.1–8.1)

## 2015-07-28 NOTE — Telephone Encounter (Signed)
Form was completed 

## 2015-07-28 NOTE — Telephone Encounter (Signed)
Pt dropped off a handicap placard form to be filled out. Form in yellow folder in dr office.

## 2015-09-01 ENCOUNTER — Other Ambulatory Visit: Payer: Self-pay | Admitting: Family Medicine

## 2015-09-08 ENCOUNTER — Other Ambulatory Visit: Payer: Self-pay | Admitting: *Deleted

## 2015-09-08 DIAGNOSIS — I714 Abdominal aortic aneurysm, without rupture, unspecified: Secondary | ICD-10-CM

## 2015-10-13 ENCOUNTER — Ambulatory Visit (INDEPENDENT_AMBULATORY_CARE_PROVIDER_SITE_OTHER): Payer: BLUE CROSS/BLUE SHIELD | Admitting: Nurse Practitioner

## 2015-10-13 ENCOUNTER — Encounter: Payer: Self-pay | Admitting: Nurse Practitioner

## 2015-10-13 VITALS — BP 162/97 | HR 83 | Temp 98.4°F | Ht 70.0 in | Wt 191.4 lb

## 2015-10-13 DIAGNOSIS — R748 Abnormal levels of other serum enzymes: Secondary | ICD-10-CM

## 2015-10-13 DIAGNOSIS — K760 Fatty (change of) liver, not elsewhere classified: Secondary | ICD-10-CM | POA: Diagnosis not present

## 2015-10-13 NOTE — Progress Notes (Signed)
cc'ed to pcp °

## 2015-10-13 NOTE — Assessment & Plan Note (Signed)
Patient with documented fatty liver disease on ultrasound in 2016. States he had a repeat ultrasound and June or July of this year [facility] LeashDon[facility] vascular surgery. We will request these records. His LFTs returned to baseline at last check 3 months ago. He continues with diet, exercise, and weight loss. Overall he is loss likely 25 pounds, walks a mile and a half a day, improved diet with low-fat foods. He wants to lose about 10-15 more pounds. He is commended for his heart work. Since his last visit he is actually been able to be taken off of his statin medication. He has a primary care office visit coming soon with likely hemoglobin A1c checked. This point I will add a CMP onto his labs to ensure that liver function is checked with his PCP labs. Return for follow-up in one year. Continue diet and exercise.

## 2015-10-13 NOTE — Assessment & Plan Note (Signed)
Previous mild elevation of LFTs which returned to baseline at last check. Is likely due to the success as she has had with diet, exercise, weight loss. I suspect his hemoglobin A1c will be much improved from the prediabetes level of 5.8 it was previously. I will recheck his liver function tests and anticipating improvement and continued baseline we'll schedule him for one year follow-up. If there are more abnormalities on his labs we can bump this up to 6 months. Continue diet and exercise, return for follow-up in one year.

## 2015-10-13 NOTE — Progress Notes (Signed)
Referring Provider: Babs SciaraLuking, [name] A, MD Primary Care Physician:  Lilyan PuntScott [name], MD Primary GI:  Dr. Jena Gaussourk  Chief Complaint  Patient presents with  . Follow-up    HPI:   [name] is a 65 y.o. male who presents for follow-up on fatty liver and elevated liver enzymes. The patient was last seen in our office 07/13/2015. At that time primary care noted a slowly increasing AST/ALT from baseline 31/23 to 63/78 a month before his last visit with us. Primary care at her to check ceruloplasmin, ANA, hepatitis B, hepatitis C and all were normal lipid panel normal on statin, mildly elevated hemoglobin A1c at 5.91 month prior, poorly controlled hypertension. His profile suggestive of early/high risk for metabolic syndrome. Recent CT of the abdomen and pelvis 01/12/2015 with normal hepatic contour without discrete hepatic lesions. Abdominal ultrasound 12/08/2014 found no gallstones, normal CBD diameter, increased echogenicity of the liver consistent with fatty infiltration. At his last visit he was doing well overall, had last 20-25 pounds intentionally with increased exercise. Monitoring his blood pressure at home, improved diet significantly, avoiding fatty foods and excessive starches and sugary foods. Limited alcohol intake. Gen. he asymptomatic from a GI standpoint. Next  Year he had a follow-up ultrasound scheduled for several days after his last visit. Ordered hepatic function panel, return for follow-up in 3 months. [place] function panel completed 07/28/2015 which found AST/ALT back to baseline at 30/36. All other indices normal. No ultrasound results could be found in the system or in medical records.  Today he states he's doing well. Continues to walk about a mile and a half a day. Continues with improved diet. No more weight loss, but is maintaining his weight currently. Will have labs drawn with PCP in a few weeks and likely will have a1c checked at that time. Had a repeat ultrasound in  June with vascular surgery. Deneis abdominal pain, N/V, yellowing of skin/eyes, darkened urine, acute episodic confusion. Denies hematochezia, melena, excessive bleeding/bruising. Denies chest pain, dyspnea, dizziness, lightheadedness, syncope, near syncope. Denies any other upper or lower GI symptoms.  Past Medical History:  Diagnosis Date  . AAA (abdominal aortic aneurysm) (HCC)   . COPD (chronic obstructive pulmonary disease) (HCC)   . Fatty liver   . Hyperlipidemia   . Hypertension   . White coat hypertension     Past Surgical History:  Procedure Laterality Date  . ANKLE SURGERY Right 2009  . APPENDECTOMY    . COLONOSCOPY  06/22/2010   ZOX:[id number]:Diminutive rectal and right colon polyps removed remainder rectum and colon appared normal  . COLONOSCOPY WITH PROPOFOL N/A 05/15/2014   Procedure: COLONOSCOPY WITH PROPOFOL;  Surgeon: Corbin Adeobert M Rourk, MD;  Location: [facility];  Service: Endoscopy;  Laterality: N/A;  In cecum @ [id number], out @ 0937, withdrawal time 19 minutes  . POLYPECTOMY N/A 05/15/2014   Procedure: POLYPECTOMY;  Surgeon: Corbin Adeobert M Rourk, MD;  Location: [facility];  Service: Endoscopy;  Laterality: N/A;    Current Outpatient Prescriptions  Medication Sig Dispense Refill  . ADVAIR DISKUS 100-50 MCG/DOSE AEPB INHALE 1 DOSE BY MOUTH TWICE DAILY 60 each 5  . albuterol (VENTOLIN HFA) 108 (90 BASE) MCG/ACT inhaler INHALE TWO PUFFS BY MOUTH EVERY 4 HOURS AS NEEDED 18 each 5  . aspirin 81 MG tablet Take 81 mg by mouth daily.    [name]. atorvastatin (LIPITOR) 40 MG tablet TAKE 1 TABLET BY MOUTH DAILY 30 tablet 1  . fexofenadine (ALLEGRA) 180 MG tablet Take 180 mg by  mouth daily.    . fish oil-omega-3 fatty acids 1000 MG capsule Take 2 g by mouth daily.    [name] lisinopril-hydrochlorothiazide (ZESTORETIC) 20-12.5 MG tablet Take 1 tablet by mouth daily. 30 tablet 5  . mometasone (NASONEX) 50 MCG/ACT nasal spray Place 2 sprays into the nose daily. 48 g 3  . Multiple Vitamin (MULTIVITAMIN) tablet Take 1 tablet by  mouth daily.    [name] SPIRIVA HANDIHALER 18 MCG inhalation capsule INHALE 1 DOSE ONCE DAILY 30 capsule 5  . vitamin C (ASCORBIC ACID) 500 MG tablet Take 500 mg by mouth daily. Reported on 07/16/2015     No current facility-administered medications for this visit.     Allergies as of 10/13/2015  . (No Known Allergies)    Family History  Problem Relation Age of Onset  . Hypertension Sister   . AAA (abdominal aortic aneurysm) Brother   . Colon cancer Neg Hx   . Liver disease Neg Hx     Social History   Social History  . Marital status: Married    Spouse name: N/A  . Number of children: N/A  . Years of education: N/A   Social History Main Topics  . Smoking status: Former Smoker    Packs/day: 1.50    Years: 20.00    Types: Cigarettes    Quit date: 04/16/2002  . Smokeless tobacco: Never Used  . Alcohol use 0.0 oz/week     Comment: On average drinks 1-2 a week (beer). Previously: drank up to 2 beers a day x 10 years  . Drug use: No  . Sexual activity: No   Other Topics Concern  . None   Social History Narrative  . None    Review of Systems: General: Negative for anorexia, fever, chills, fatigue, weakness. ENT: Negative for hoarseness, difficulty swallowing. CV: Negative for chest pain, angina, palpitations, peripheral edema.  Respiratory: Negative for dyspnea at rest, cough, sputum, wheezing.  GI: See history of present illness. Endo: Negative for unusual weight change.  Heme: Negative for bruising or bleeding.   Physical Exam: BP (!) 162/97   Pulse 83   Temp 98.4 F (36.9 C) (Oral)   Ht 5\' 10"  (1.778 m)   Wt 191 lb 6.4 oz (86.8 kg)   BMI 27.46 kg/m  General:   Alert and oriented. Pleasant and cooperative. Well-nourished and well-developed.  Eyes:  Without icterus, sclera clear and conjunctiva pink.  Ears:  Normal auditory acuity. Cardiovascular:  S1, S2 present with systolic murmur appreciated. Extremities without clubbing or edema. Respiratory:  Clear to  auscultation bilaterally. No wheezes, rales, or rhonchi. No distress.  Gastrointestinal:  +BS, soft, non-tender and non-distended. No HSM noted. No guarding or rebound. No masses appreciated.  Rectal:  Deferred  Musculoskalatal:  Symmetrical without gross deformities. Neurologic:  Alert and oriented x4;  grossly normal neurologically. Psych:  Alert and cooperative. Normal mood and affect. Heme/Lymph/Immune: No excessive bruising noted.    10/13/2015 9:18 AM   Disclaimer: This note was dictated with voice recognition software. Similar sounding words can inadvertently be transcribed and may not be corrected upon review.

## 2015-10-13 NOTE — Patient Instructions (Signed)
1. We will request your ultrasound from [place]. 2. Have your lab test drawn with the labs your primary care doctor has ordered. 3. Continue your excellent efforts at diet and exercise. I feel they're paying off well for you. 4. Return for follow-up in one year. If your labs are abnormal we can bump this up to 6 months.

## 2015-10-31 ENCOUNTER — Other Ambulatory Visit: Payer: Self-pay | Admitting: Family Medicine

## 2015-11-09 ENCOUNTER — Telehealth: Payer: Self-pay | Admitting: Family Medicine

## 2015-11-09 DIAGNOSIS — E785 Hyperlipidemia, unspecified: Secondary | ICD-10-CM

## 2015-11-09 DIAGNOSIS — I10 Essential (primary) hypertension: Secondary | ICD-10-CM

## 2015-11-09 DIAGNOSIS — Z125 Encounter for screening for malignant neoplasm of prostate: Secondary | ICD-10-CM

## 2015-11-09 DIAGNOSIS — R739 Hyperglycemia, unspecified: Secondary | ICD-10-CM

## 2015-11-09 DIAGNOSIS — Z79899 Other long term (current) drug therapy: Secondary | ICD-10-CM

## 2015-11-09 NOTE — Telephone Encounter (Signed)
Orders ready. Tried to call no answer 

## 2015-11-09 NOTE — Telephone Encounter (Signed)
Lipid, liver, metabolic 7, hemoglobin A1c, PSA-[facility]

## 2015-11-09 NOTE — Telephone Encounter (Signed)
Patient needs order for blood work for appointment on 12/03/15.  [facility] to [facility].

## 2015-11-11 ENCOUNTER — Other Ambulatory Visit: Payer: Self-pay | Admitting: *Deleted

## 2015-11-11 DIAGNOSIS — I10 Essential (primary) hypertension: Secondary | ICD-10-CM

## 2015-11-11 DIAGNOSIS — Z125 Encounter for screening for malignant neoplasm of prostate: Secondary | ICD-10-CM

## 2015-11-11 DIAGNOSIS — E785 Hyperlipidemia, unspecified: Secondary | ICD-10-CM

## 2015-11-11 DIAGNOSIS — R739 Hyperglycemia, unspecified: Secondary | ICD-10-CM

## 2015-11-11 DIAGNOSIS — Z79899 Other long term (current) drug therapy: Secondary | ICD-10-CM

## 2015-11-11 NOTE — Telephone Encounter (Signed)
Pt notified. Orders ready at front window.

## 2015-11-20 LAB — HEMOGLOBIN A1C
Hgb A1c MFr Bld: 5.5 % (ref <5.7)
Mean Plasma Glucose: 111 mg/dL

## 2015-11-20 LAB — HEPATIC FUNCTION PANEL
ALT: 28 U/L (ref 9–46)
AST: 35 U/L (ref 10–35)
Albumin: 4.3 g/dL (ref 3.6–5.1)
Alkaline Phosphatase: 50 U/L (ref 40–115)
Bilirubin, Direct: 0.2 mg/dL (ref <=0.2)
Indirect Bilirubin: 0.7 mg/dL (ref 0.2–1.2)
Total Bilirubin: 0.9 mg/dL (ref 0.2–1.2)
Total Protein: 7.3 g/dL (ref 6.1–8.1)

## 2015-11-20 LAB — LIPID PANEL
Cholesterol: 203 mg/dL — ABNORMAL HIGH (ref 125–200)
HDL: 58 mg/dL (ref >=40)
LDL Cholesterol: 113 mg/dL (ref <130)
Total CHOL/HDL Ratio: 3.5 Ratio (ref <=5.0)
Triglycerides: 162 mg/dL — ABNORMAL HIGH (ref <150)
VLDL: 32 mg/dL — ABNORMAL HIGH (ref <30)

## 2015-11-20 LAB — COMPREHENSIVE METABOLIC PANEL
ALT: 29 U/L (ref 9–46)
AST: 35 U/L (ref 10–35)
Albumin: 4.4 g/dL (ref 3.6–5.1)
Alkaline Phosphatase: 49 U/L (ref 40–115)
BUN: 10 mg/dL (ref 7–25)
CO2: 27 mmol/L (ref 20–31)
Calcium: 9.6 mg/dL (ref 8.6–10.3)
Chloride: 99 mmol/L (ref 98–110)
Creat: 0.64 mg/dL — ABNORMAL LOW (ref 0.70–1.25)
Glucose, Bld: 92 mg/dL (ref 65–99)
Potassium: 4.7 mmol/L (ref 3.5–5.3)
Sodium: 138 mmol/L (ref 135–146)
Total Bilirubin: 0.9 mg/dL (ref 0.2–1.2)
Total Protein: 7.2 g/dL (ref 6.1–8.1)

## 2015-11-20 LAB — PSA: PSA: 1.4 ng/mL (ref <=4.0)

## 2015-11-30 ENCOUNTER — Other Ambulatory Visit: Payer: Self-pay | Admitting: Family Medicine

## 2015-12-02 ENCOUNTER — Encounter: Payer: BLUE CROSS/BLUE SHIELD | Admitting: Family Medicine

## 2015-12-03 ENCOUNTER — Encounter: Payer: Self-pay | Admitting: Family Medicine

## 2015-12-03 ENCOUNTER — Ambulatory Visit (INDEPENDENT_AMBULATORY_CARE_PROVIDER_SITE_OTHER): Payer: BLUE CROSS/BLUE SHIELD | Admitting: Family Medicine

## 2015-12-03 VITALS — BP 144/94 | Ht 69.0 in | Wt 194.1 lb

## 2015-12-03 DIAGNOSIS — R7303 Prediabetes: Secondary | ICD-10-CM | POA: Diagnosis not present

## 2015-12-03 DIAGNOSIS — E784 Other hyperlipidemia: Secondary | ICD-10-CM | POA: Diagnosis not present

## 2015-12-03 DIAGNOSIS — J449 Chronic obstructive pulmonary disease, unspecified: Secondary | ICD-10-CM | POA: Diagnosis not present

## 2015-12-03 DIAGNOSIS — Z0000 Encounter for general adult medical examination without abnormal findings: Secondary | ICD-10-CM

## 2015-12-03 DIAGNOSIS — I10 Essential (primary) hypertension: Secondary | ICD-10-CM | POA: Diagnosis not present

## 2015-12-03 DIAGNOSIS — J438 Other emphysema: Secondary | ICD-10-CM

## 2015-12-03 DIAGNOSIS — E7849 Other hyperlipidemia: Secondary | ICD-10-CM

## 2015-12-03 DIAGNOSIS — Z23 Encounter for immunization: Secondary | ICD-10-CM

## 2015-12-03 MED ORDER — LISINOPRIL-HYDROCHLOROTHIAZIDE 20-12.5 MG PO TABS: ORAL_TABLET | ORAL | 5 refills | Status: DC

## 2015-12-03 MED ORDER — MOMETASONE FUROATE 50 MCG/ACT NA SUSP: 2.0000 | Freq: Every day | NASAL | 11 refills | Status: DC

## 2015-12-03 MED ORDER — ALBUTEROL SULFATE HFA 108 (90 BASE) MCG/ACT IN AERS: INHALATION_SPRAY | RESPIRATORY_TRACT | 5 refills | Status: DC

## 2015-12-03 MED ORDER — FLUTICASONE-SALMETEROL 100-50 MCG/DOSE IN AEPB: INHALATION_SPRAY | RESPIRATORY_TRACT | 5 refills | Status: DC

## 2015-12-03 MED ORDER — TIOTROPIUM BROMIDE MONOHYDRATE 18 MCG IN CAPS: ORAL_CAPSULE | RESPIRATORY_TRACT | 5 refills | Status: DC

## 2015-12-03 MED ORDER — ATORVASTATIN CALCIUM 40 MG PO TABS: 40.0000 mg | ORAL_TABLET | Freq: Every day | ORAL | 5 refills | Status: DC

## 2015-12-03 NOTE — Progress Notes (Signed)
Subjective:    Patient ID: Daniel BoutonKenneth D [name], male    DOB: March 05, 1950, 65 y.o.   MRN: [id number]  HPI The patient comes in today for a wellness visit.    A review of their health history was completed.  A review of medications was also completed.  Any needed refills; yes  Eating habits: States eating habits are pretty good. Has been trying for the past few years to make healthy choices   Falls/  MVA accidents in past few months: None   Regular exercise: States walks 1.5 miles 4-5 days per week  Specialist pt sees on regular basis: Dr.Fields  Preventative health issues were discussed.   Additional concerns:Patient does have some questions regarding his COPD medicine. We discussed how the medicine is using Y is used in how it helps protect him  Patient does take his blood pressure medicine regular basis he is walking on a regular basis Patient's cholesterol slightly elevated compared where it was lab work was reviewed in detail with the patient patient takes his medicine watches his diet Patient also uses allergy medicines on a regular basis Patient up-to-date on colonoscopy Review of Systems  Constitutional: Negative for activity change, appetite change and fever.  HENT: Negative for congestion and rhinorrhea.   Eyes: Negative for discharge.  Respiratory: Negative for cough and wheezing.   Cardiovascular: Negative for chest pain.  Gastrointestinal: Negative for abdominal pain, blood in stool and vomiting.  Genitourinary: Negative for difficulty urinating and frequency.  Musculoskeletal: Negative for neck pain.  Skin: Negative for rash.  Allergic/Immunologic: Negative for environmental allergies and food allergies.  Neurological: Negative for weakness and headaches.  Psychiatric/Behavioral: Negative for agitation.       Objective:   Physical Exam  Constitutional: He appears well-developed and well-nourished.  HENT:  Head: Normocephalic and atraumatic.  Right Ear:  External ear normal.  Left Ear: External ear normal.  Nose: Nose normal.  Mouth/Throat: Oropharynx is clear and moist.  Eyes: EOM are normal. Pupils are equal, round, and reactive to light.  Neck: Normal range of motion. Neck supple. No thyromegaly present.  Cardiovascular: Normal rate, regular rhythm and normal heart sounds.   No murmur heard. Pulmonary/Chest: Effort normal and breath sounds normal. No respiratory distress. He has no wheezes.  Abdominal: Soft. Bowel sounds are normal. He exhibits no distension and no mass. There is no tenderness.  Genitourinary: Prostate normal.  Musculoskeletal: Normal range of motion. He exhibits no edema.  Lymphadenopathy:    He has no cervical adenopathy.  Neurological: He is alert. He exhibits normal muscle tone.  Skin: Skin is warm and dry. No erythema.  Psychiatric: He has a normal mood and affect. His behavior is normal. Judgment normal.          Assessment & Plan:  Labs reviewed in detail Wellness Adult wellness-complete.wellness physical was conducted today. Importance of diet and exercise were discussed in detail. In addition to this a discussion regarding safety was also covered. We also reviewed over immunizations and gave recommendations regarding current immunization needed for age. In addition to this additional areas were also touched on including: Preventative health exams needed: Colonoscopy up-to-date on colonoscopy  Patient was advised yearly wellness exam  Blood pressure good control continue current measures watch diet  COPD Prevnar 13 shot today continue medications follow-up if ongoing trouble patient is RD had flu shot  Hyperlipidemia patient once to work on diet he will recheck lab work in 6 months if still not where it needs  to be may need adjustment of medication  Prediabetes do much better on diet  Patient follows up with vascular doctor regarding aortic aneurysm

## 2016-01-21 ENCOUNTER — Encounter: Payer: Self-pay | Admitting: Family

## 2016-01-28 ENCOUNTER — Encounter: Payer: Self-pay | Admitting: Family

## 2016-01-28 ENCOUNTER — Ambulatory Visit (HOSPITAL_COMMUNITY)
Admission: RE | Admit: 2016-01-28 | Discharge: 2016-01-28 | Disposition: A | Discharge status: Home / Self Care | Payer: BLUE CROSS/BLUE SHIELD | Source: Ambulatory Visit | Attending: Family | Admitting: Family

## 2016-01-28 ENCOUNTER — Ambulatory Visit (INDEPENDENT_AMBULATORY_CARE_PROVIDER_SITE_OTHER): Payer: BLUE CROSS/BLUE SHIELD | Admitting: Family

## 2016-01-28 VITALS — BP 133/85 | HR 111 | Temp 98.9°F | Resp 16 | Ht 69.0 in | Wt 194.0 lb

## 2016-01-28 DIAGNOSIS — I714 Abdominal aortic aneurysm, without rupture, unspecified: Secondary | ICD-10-CM

## 2016-01-28 NOTE — Patient Instructions (Addendum)
Abdominal Aortic Aneurysm Blood pumps away from the heart through tubes (blood vessels) called arteries. Aneurysms are weak or damaged places in the wall of an artery. It bulges out like a balloon. An abdominal aortic aneurysm happens in the main artery of the body (aorta). It can burst or tear, causing bleeding inside the body. This is an emergency. It needs treatment right away. What are the causes? The exact cause is unknown. Things that could cause this problem include:  Fat and other substances building up in the lining of a tube.  Swelling of the walls of a blood vessel.  Certain tissue diseases.  Belly (abdominal) trauma.  An infection in the main artery of the body. What increases the risk? There are things that make it more likely for you to have an aneurysm. These include:  Being over the age of 63 years old.  Having high blood pressure (hypertension).  Being a male.  Being white.  Being very overweight (obese).  Having a family history of aneurysm.  Using tobacco products. What are the signs or symptoms? Symptoms depend on the size of the aneurysm and how fast it grows. There may not be symptoms. If symptoms occur, they can include:  Pain (belly, side, lower back, or groin).  Feeling full after eating a small amount of food.  Feeling sick to your stomach (nauseous), throwing up (vomiting), or both.  Feeling a lump in your belly that feels like it is beating (pulsating).  Feeling like you will pass out (faint). How is this treated?  Medicine to control blood pressure and pain.  Imaging tests to see if the aneurysm gets bigger.  Surgery. How is this prevented? To lessen your chance of getting this condition:  Stop smoking. Stop chewing tobacco.  Limit or avoid alcohol.  Keep your blood pressure, blood sugar, and cholesterol within normal limits.  Eat less salt.  Eat foods low in saturated fats and cholesterol. These are found in animal and whole  dairy products.  Eat more fiber. Fiber is found in whole grains, vegetables, and fruits.  Keep a healthy weight.  Stay active and exercise often. This information is not intended to replace advice given to you by your health care provider. Make sure you discuss any questions you have with your health care provider. Document Released: 05/21/2012 Document Revised: 07/02/2015 Document Reviewed: 02/24/2012 Elsevier Interactive Patient Education  2017 Elsevier Inc.      Before your next abdominal ultrasound:  Take two Extra-Strength Gas-X capsules at bedtime the night before the test. Take another two Extra-Strength Gas-X capsules 3 hours before the test.   

## 2016-01-28 NOTE — Progress Notes (Signed)
[facility] [place]   CC: Follow up Abdominal Aortic Aneurysm  History of Present Illness  [name] is a 65 y.o. (1950-06-18) male patient of Dr. Darrick [name] who presents for evaluation of abdominal aortic aneurysm. The patient has been asymptomatic. His aneurysm was discovered on a routine ultrasound for evaluation of elevated liver function tests. His family history is remarkable for his brother who had an abdominal aortic aneurysm repaired at age 23. Other medical problems include COPD, hyperlipidemia, hypertension all of which are currently stable. He is a former smoker and quit 2004. He does have COPD but does not require home oxygen and can climb more than 2 flights of stairs without becoming short of breath.  He returns today for follow-up.  His aneurysm was 4.7 cm in diameter on previous CT scan on 01-12-15 while ultrasound at that time showed it to be 5.3 cm.  He denies any history of stroke, denies claudication sx's with walking.  Creatinine was 0.64 on 11-19-15   Pt Diabetic: No Pt smoker: former smoker, quit in 16109, smoked for 15 years  Past Medical History:  Diagnosis Date  . AAA (abdominal aortic aneurysm) (HCC)   . COPD (chronic obstructive pulmonary disease) (HCC)   . Fatty liver   . Hyperlipidemia   . Hypertension   . White coat hypertension    Past Surgical History:  Procedure Laterality Date  . ANKLE SURGERY Right 2009  . APPENDECTOMY    . COLONOSCOPY  06/22/2010   [id number] rectal and right colon polyps removed remainder rectum and colon appared normal  . COLONOSCOPY WITH PROPOFOL N/A 05/15/2014   Procedure: COLONOSCOPY WITH PROPOFOL;  Surgeon: Corbin Ade, MD;  Location: [facility];  Service: Endoscopy;  Laterality: N/A;  In cecum @ [id number], out @ 0937, withdrawal time 19 minutes  . POLYPECTOMY N/A 05/15/2014   Procedure: POLYPECTOMY;  Surgeon: Corbin Ade, MD;  Location: [facility];  Service: Endoscopy;  Laterality: N/A;   Social  History Social History   Social History  . Marital status: Married    Spouse name: N/A  . Number of children: N/A  . Years of education: N/A   Occupational History  . Not on file.   Social History Main Topics  . Smoking status: Former Smoker    Packs/day: 1.50    Years: 20.00    Types: Cigarettes    Quit date: 04/16/2002  . Smokeless tobacco: Never Used  . Alcohol use 0.0 oz/week     Comment: On average drinks 1-2 a week (beer). Previously: drank up to 2 beers a day x 10 years  . Drug use: No  . Sexual activity: No   Other Topics Concern  . Not on file   Social History Narrative  . No narrative on file   Family History Family History  Problem Relation Age of Onset  . Hypertension Sister   . AAA (abdominal aortic aneurysm) Brother   . Colon cancer Neg Hx   . Liver disease Neg Hx     Current Outpatient Prescriptions on File Prior to Visit  Medication Sig Dispense Refill  . albuterol (VENTOLIN HFA) 108 (90 Base) MCG/ACT inhaler INHALE TWO PUFFS BY MOUTH EVERY 4 HOURS AS NEEDED 18 each 5  . aspirin 81 MG tablet Take 81 mg by mouth daily.    [name] atorvastatin (LIPITOR) 40 MG tablet Take 1 tablet (40 mg total) by mouth daily. 30 tablet 5  . fexofenadine (ALLEGRA) 180 MG tablet Take 180  mg by mouth daily.    . fish oil-omega-3 fatty acids 1000 MG capsule Take 2 g by mouth daily.    . Fluticasone-Salmeterol (ADVAIR DISKUS) 100-50 MCG/DOSE AEPB INHALE 1 DOSE BY MOUTH TWICE DAILY 60 each 5  . lisinopril-hydrochlorothiazide (PRINZIDE,ZESTORETIC) 20-12.5 MG tablet TAKE 1 TABLET BY MOUTH EVERY DAY (DISCONTINUE LISINOPRIL) 30 tablet 5  . mometasone (NASONEX) 50 MCG/ACT nasal spray Place 2 sprays into the nose daily. 48 g 11  . Multiple Vitamin (MULTIVITAMIN) tablet Take 1 tablet by mouth daily.    [name]. tiotropium (SPIRIVA HANDIHALER) 18 MCG inhalation capsule INHALE 1 DOSE ONCE DAILY 30 capsule 5  . vitamin C (ASCORBIC ACID) 500 MG tablet Take 500 mg by mouth daily. Reported on 07/16/2015      No current facility-administered medications on file prior to visit.    No Known Allergies  ROS: See HPI for pertinent positives and negatives.  Physical Examination  Vitals:   01/28/16 0926  BP: 133/85  Pulse: (!) 111  Resp: 16  Temp: 98.9 F (37.2 C)  TempSrc: Oral  SpO2: 95%  Weight: 194 lb (88 kg)  Height: 5\' 9"  (1.753 m)   Body mass index is 28.65 kg/m.  General: A&O x 3, WD, male in NAD.  Pulmonary: Sym exp, respirations are non labored, good air movt, CTAB, no rales, rhonchi, or wheezing.  Cardiac: RRR, Nl S1, S2, no detected murmur.   Carotid Bruits Right Left   Negative Negative   Aorta is not palpable Radial pulses are 2+ palpable                          VASCULAR EXAM:                                                                                                         LE Pulses Right Left       FEMORAL  not palpable  not palpable        POPLITEAL  not palpable   not palpable       POSTERIOR TIBIAL  not palpable   not palpable        DORSALIS PEDIS      ANTERIOR TIBIAL faintly palpable  faintly palpable      Gastrointestinal: soft, NTND, -G/R, - HSM, - masses palpated, - CVAT B.  Musculoskeletal: M/S 5/5 throughout, Extremities without ischemic changes.  Neurologic: CN 2-12 intact, Pain and light touch intact in extremities are intact, Motor exam as listed above.    Medical Decision Making  The patient is a 65 y.o. male who presents with asymptomatic AAA with no increase in size, based on limited visualization on duplex today due to overlying bowel gas. Largest measured diameter today is 4.7 cm, compared to 5.38 cm on 07-16-15. I spoke with [name], aorta seems to be visualized adequately, bilateral CIA's are not visualized.    Based on this patient's exam and diagnostic studies, the patient will follow up in 6 months  with the following studies: AAA duplex, see me  on a day that [name] is in the office.  Consideration for  repair of AAA would be made when the size is 5.0 cm, growth > 1 cm/yr, and symptomatic status.  I emphasized the importance of maximal medical management including strict control of blood pressure, blood glucose, and lipid levels, antiplatelet agents, obtaining regular exercise, and continued cessation of smoking.   The patient was given information about AAA including signs, symptoms, treatment, and how to minimize the risk of enlargement and rupture of aneurysms.    The patient was advised to call 911 should the patient experience sudden onset abdominal or back pain.   Thank you for allowing us to participate in this patient's care.  Charisse MarchSuzanne Nickel, RN, MSN, FNP-C [facility] [place] Office: [phone]  Clinic Physician: [name]  01/28/2016, 9:35 AM

## 2016-01-28 NOTE — Addendum Note (Signed)
Addended by: Burton ApleyPETTY, Katrina Brosh A on: 01/28/2016 11:28 AM   Modules accepted: Orders

## 2016-01-31 ENCOUNTER — Encounter (HOSPITAL_COMMUNITY): Payer: Self-pay | Admitting: *Deleted

## 2016-01-31 ENCOUNTER — Inpatient Hospital Stay (HOSPITAL_COMMUNITY)
Admission: EM | Admit: 2016-01-31 | Discharge: 2016-02-05 | DRG: 280 | Disposition: A | Discharge status: Home / Self Care | Payer: BLUE CROSS/BLUE SHIELD | Attending: Cardiology | Admitting: Cardiology

## 2016-01-31 ENCOUNTER — Emergency Department (HOSPITAL_COMMUNITY): Payer: BLUE CROSS/BLUE SHIELD

## 2016-01-31 DIAGNOSIS — R0902 Hypoxemia: Secondary | ICD-10-CM

## 2016-01-31 DIAGNOSIS — R778 Other specified abnormalities of plasma proteins: Secondary | ICD-10-CM

## 2016-01-31 DIAGNOSIS — Z79899 Other long term (current) drug therapy: Secondary | ICD-10-CM

## 2016-01-31 DIAGNOSIS — I10 Essential (primary) hypertension: Secondary | ICD-10-CM | POA: Diagnosis present

## 2016-01-31 DIAGNOSIS — I350 Nonrheumatic aortic (valve) stenosis: Secondary | ICD-10-CM | POA: Diagnosis present

## 2016-01-31 DIAGNOSIS — Z7982 Long term (current) use of aspirin: Secondary | ICD-10-CM

## 2016-01-31 DIAGNOSIS — E785 Hyperlipidemia, unspecified: Secondary | ICD-10-CM | POA: Diagnosis present

## 2016-01-31 DIAGNOSIS — R7989 Other specified abnormal findings of blood chemistry: Secondary | ICD-10-CM

## 2016-01-31 DIAGNOSIS — J440 Chronic obstructive pulmonary disease with acute lower respiratory infection: Secondary | ICD-10-CM | POA: Diagnosis present

## 2016-01-31 DIAGNOSIS — I214 Non-ST elevation (NSTEMI) myocardial infarction: Secondary | ICD-10-CM | POA: Diagnosis not present

## 2016-01-31 DIAGNOSIS — I714 Abdominal aortic aneurysm, without rupture: Secondary | ICD-10-CM | POA: Diagnosis present

## 2016-01-31 DIAGNOSIS — R7303 Prediabetes: Secondary | ICD-10-CM | POA: Diagnosis present

## 2016-01-31 DIAGNOSIS — J189 Pneumonia, unspecified organism: Secondary | ICD-10-CM | POA: Diagnosis present

## 2016-01-31 DIAGNOSIS — J441 Chronic obstructive pulmonary disease with (acute) exacerbation: Secondary | ICD-10-CM

## 2016-01-31 DIAGNOSIS — Z87891 Personal history of nicotine dependence: Secondary | ICD-10-CM

## 2016-01-31 DIAGNOSIS — K760 Fatty (change of) liver, not elsewhere classified: Secondary | ICD-10-CM | POA: Diagnosis present

## 2016-01-31 DIAGNOSIS — R079 Chest pain, unspecified: Secondary | ICD-10-CM

## 2016-01-31 DIAGNOSIS — K5900 Constipation, unspecified: Secondary | ICD-10-CM | POA: Diagnosis present

## 2016-01-31 LAB — CBC
HCT: 40.4 % (ref 39.0–52.0)
Hemoglobin: 14.2 g/dL (ref 13.0–17.0)
MCH: 32.8 pg (ref 26.0–34.0)
MCHC: 35.1 g/dL (ref 30.0–36.0)
MCV: 93.3 fL (ref 78.0–100.0)
Platelets: 208 10*3/uL (ref 150–400)
RBC: 4.33 MIL/uL (ref 4.22–5.81)
RDW: 12.7 % (ref 11.5–15.5)
WBC: 19.4 10*3/uL — ABNORMAL HIGH (ref 4.0–10.5)

## 2016-01-31 LAB — BASIC METABOLIC PANEL
Anion gap: 14 (ref 5–15)
BUN: 14 mg/dL (ref 6–20)
CO2: 26 mmol/L (ref 22–32)
Calcium: 9.6 mg/dL (ref 8.9–10.3)
Chloride: 87 mmol/L — ABNORMAL LOW (ref 101–111)
Creatinine, Ser: 0.77 mg/dL (ref 0.61–1.24)
GFR calc Af Amer: >60 mL/min (ref >60)
GFR calc non Af Amer: >60 mL/min (ref >60)
Glucose, Bld: 152 mg/dL — ABNORMAL HIGH (ref 65–99)
Potassium: 3.6 mmol/L (ref 3.5–5.1)
Sodium: 127 mmol/L — ABNORMAL LOW (ref 135–145)

## 2016-01-31 LAB — I-STAT CG4 LACTIC ACID, ED: Lactic Acid, Venous: 3.05 mmol/L (ref 0.5–1.9)

## 2016-01-31 MED ORDER — SODIUM CHLORIDE 0.9 % IV BOLUS (SEPSIS)
500.0000 mL | Freq: Once | INTRAVENOUS | Status: AC
  Administered 2016-01-31: 500 mL via INTRAVENOUS

## 2016-01-31 MED ORDER — METHYLPREDNISOLONE SODIUM SUCC 125 MG IJ SOLR
125.0000 mg | Freq: Once | INTRAMUSCULAR | Status: AC
  Administered 2016-01-31: 125 mg via INTRAVENOUS
  Filled 2016-01-31: qty 2

## 2016-01-31 MED ORDER — IPRATROPIUM BROMIDE 0.02 % IN SOLN
0.5000 mg | Freq: Once | RESPIRATORY_TRACT | Status: AC
Start: 2016-01-31 — End: 2016-01-31
  Administered 2016-01-31: 0.5 mg via RESPIRATORY_TRACT
  Filled 2016-01-31: qty 2.5

## 2016-01-31 MED ORDER — MAGNESIUM SULFATE 2 GM/50ML IV SOLN
2.0000 g | Freq: Once | INTRAVENOUS | Status: AC
  Administered 2016-01-31: 2 g via INTRAVENOUS
  Filled 2016-01-31: qty 50

## 2016-01-31 MED ORDER — IPRATROPIUM-ALBUTEROL 0.5-2.5 (3) MG/3ML IN SOLN
3.0000 mL | Freq: Once | RESPIRATORY_TRACT | Status: AC
  Administered 2016-01-31: 3 mL via RESPIRATORY_TRACT
  Filled 2016-01-31: qty 3

## 2016-01-31 MED ORDER — SODIUM CHLORIDE 0.9 % IV BOLUS (SEPSIS)
1000.0000 mL | Freq: Once | INTRAVENOUS | Status: AC
  Administered 2016-01-31: 1000 mL via INTRAVENOUS

## 2016-01-31 MED ORDER — ALBUTEROL SULFATE (2.5 MG/3ML) 0.083% IN NEBU
2.5000 mg | INHALATION_SOLUTION | Freq: Once | RESPIRATORY_TRACT | Status: AC
  Administered 2016-01-31: 2.5 mg via RESPIRATORY_TRACT
  Filled 2016-01-31: qty 3

## 2016-01-31 MED ORDER — ALBUTEROL (5 MG/ML) CONTINUOUS INHALATION SOLN
10.0000 mg/h | INHALATION_SOLUTION | Freq: Once | RESPIRATORY_TRACT | Status: AC
  Administered 2016-01-31: 10 mg/h via RESPIRATORY_TRACT
  Filled 2016-01-31: qty 20

## 2016-01-31 NOTE — ED Notes (Signed)
Dr Jacqulyn BathLong notified of lactic acid of 3.05, no additional orders given,

## 2016-01-31 NOTE — ED Provider Notes (Signed)
[facility]-EMER[facility]Y DEPT Provider Note   CSN: [id number] Arrival date & time: 01/31/16  2226  By signing my name below, I, Vista Mink, attest that this documentation has been prepared under the direction and in the presence of Rinaldo Cloud, MD. Electronically signed, Vista Mink, ED Scribe. 01/31/16. 11:11 PM.  Time seen 23:05 PM  History   Chief Complaint Chief Complaint  Patient presents with  . Respiratory Distress   Level V caveat due to respiratory distress.   HPI HPI Comments: [name] is a 65 y.o. male, with Hx of COPD, AAA, HTN, who presents to the Emergency Department complaining of gradually worsening shortness of breath with associated wheezing and non-productive cough that started 3-4 days ago. He also reports intermittent chills and rhinorrhea. He has used his inhaler at home with minimal relief of symptoms. Pt is not on supplemental O2 at home. Pt states that this is one of the worst episodes of COPD exacerbation that he has had. Pt received a breathing treatment on arrival which only minimal relief of his shortness of breath. He has never had to be admitted for respiratory complications. He is not a smoker anymore. No fever or sore throat. He denies swelling of his legs. He does report dyspnea on exertion.  The history is provided by the patient. No language interpreter was used.   PCP Lilyan Punt, MD   Past Medical History:  Diagnosis Date  . AAA (abdominal aortic aneurysm) (HCC)   . COPD (chronic obstructive pulmonary disease) (HCC)   . Fatty liver   . Hyperlipidemia   . Hypertension   . White coat hypertension     Patient Active Problem List   Diagnosis Date Noted  . NSTEMI (non-ST elevated myocardial infarction) (HCC) 02/01/2016  . Fatty liver 12/09/2014  . AAA (abdominal aortic aneurysm) without rupture (HCC) 12/09/2014  . Elevated liver enzymes 12/01/2014  . Diverticulosis of colon without hemorrhage   . History of colonic polyps   . Other  hemorrhoids   . Encounter for screening colonoscopy 04/16/2014  . Prediabetes 11/27/2013  . Hyperlipidemia 05/02/2013  . COPD (chronic obstructive pulmonary disease) (HCC) 06/14/2012  . Elevated blood pressure reading in office with white coat syndrome, with diagnosis of hypertension 06/14/2012    Past Surgical History:  Procedure Laterality Date  . ANKLE SURGERY Right 2009  . APPENDECTOMY    . COLONOSCOPY  06/22/2010   [id number] rectal and right colon polyps removed remainder rectum and colon appared normal  . COLONOSCOPY WITH PROPOFOL N/A 05/15/2014   Procedure: COLONOSCOPY WITH PROPOFOL;  Surgeon: Corbin Ade, MD;  Location: [facility];  Service: Endoscopy;  Laterality: N/A;  In cecum @ [id number], out @ 0937, withdrawal time 19 minutes  . POLYPECTOMY N/A 05/15/2014   Procedure: POLYPECTOMY;  Surgeon: Corbin Ade, MD;  Location: [facility];  Service: Endoscopy;  Laterality: N/A;       Home Medications    Prior to Admission medications   Medication Sig Start Date End Date Taking? Authorizing Provider  albuterol (VENTOLIN HFA) 108 (90 Base) MCG/ACT inhaler INHALE TWO PUFFS BY MOUTH EVERY 4 HOURS AS NEEDED 12/03/15  Yes [name], MD  aspirin 81 MG tablet Take 81 mg by mouth daily.   Yes Historical Provider, MD  atorvastatin (LIPITOR) 40 MG tablet Take 1 tablet (40 mg total) by mouth daily. 12/03/15  Yes [name], MD  fexofenadine (ALLEGRA) 180 MG tablet Take 180 mg by mouth daily.   Yes Historical Provider, MD  fish oil-omega-3 fatty acids 1000 MG capsule Take 2 g by mouth daily.   Yes Historical Provider, MD  Fluticasone-Salmeterol (ADVAIR DISKUS) 100-50 MCG/DOSE AEPB INHALE 1 DOSE BY MOUTH TWICE DAILY 12/03/15  Yes [name], MD  lisinopril-hydrochlorothiazide (PRINZIDE,ZESTORETIC) 20-12.5 MG tablet TAKE 1 TABLET BY MOUTH EVERY DAY (DISCONTINUE LISINOPRIL) Patient taking differently: Take 1 tablet by mouth daily.  12/03/15  Yes [name], MD  mometasone (NASONEX) 50  MCG/ACT nasal spray Place 2 sprays into the nose daily. 12/03/15  Yes [name], MD  Multiple Vitamin (MULTIVITAMIN) tablet Take 1 tablet by mouth daily.   Yes Historical Provider, MD  tiotropium (SPIRIVA HANDIHALER) 18 MCG inhalation capsule INHALE 1 DOSE ONCE DAILY 12/03/15  Yes [name], MD  vitamin C (ASCORBIC ACID) 500 MG tablet Take 500 mg by mouth daily. Reported on 07/16/2015   Yes Historical Provider, MD    Family History Family History  Problem Relation Age of Onset  . Hypertension Sister   . AAA (abdominal aortic aneurysm) Brother   . Colon cancer Neg Hx   . Liver disease Neg Hx     Social History Social History  Substance Use Topics  . Smoking status: Former Smoker    Packs/day: 1.50    Years: 20.00    Types: Cigarettes    Quit date: 04/16/2002  . Smokeless tobacco: Never Used  . Alcohol use 0.0 oz/week     Comment: On average drinks 1-2 a week (beer). Previously: drank up to 2 beers a day x 10 years  lives with spouse   Allergies   Patient has no known allergies.   Review of Systems Review of Systems  Constitutional: Positive for chills. Negative for fever.  HENT: Positive for rhinorrhea.   Respiratory: Positive for cough, shortness of breath and wheezing.   All other systems reviewed and are negative.    Physical Exam Updated Vital Signs ED Triage Vitals  Enc Vitals Group     BP 01/31/16 2242 132/84     Pulse Rate 01/31/16 2242 (!) 135     Resp 01/31/16 2242 (!) 28     Temp 01/31/16 2242 98.7 F (37.1 C)     Temp Source 01/31/16 2242 Oral     SpO2 01/31/16 2242 (!) 80 %     Weight 01/31/16 2241 194 lb (88 kg)     Height 01/31/16 2241 5\' 9"  (1.753 m)     Head Circumference --      Peak Flow --      Pain Score 01/31/16 2241 8     Pain Loc --      Pain Edu? --      Excl. in GC? --      Vital signs normal Except for hypoxia, tachycardia, and tachypnea   Physical Exam  Constitutional: He is oriented to person, place, and time. He  appears well-developed and well-nourished.  Non-toxic appearance. He does not appear ill. He appears distressed.  HENT:  Head: Normocephalic and atraumatic.  Right Ear: External ear normal.  Left Ear: External ear normal.  Nose: Nose normal. No mucosal edema or rhinorrhea.  Mouth/Throat: Oropharynx is clear and moist and mucous membranes are normal. No dental abscesses or uvula swelling.  Eyes: Conjunctivae and EOM are normal. Pupils are equal, round, and reactive to light.  Neck: Normal range of motion and full passive range of motion without pain. Neck supple.  Cardiovascular: Normal rate, regular rhythm and normal heart sounds.  Exam reveals  no gallop and no friction rub.   No murmur heard. Pulmonary/Chest: Accessory muscle usage present. Tachypnea noted. He is in respiratory distress. He has decreased breath sounds. He has no wheezes. He has no rhonchi. He has no rales. He exhibits no tenderness and no crepitus.  Patient has trouble talking due to shortness of breath, he has no wheezing however I do not hear any air movement on my exam.  Abdominal: Soft. Normal appearance and bowel sounds are normal. He exhibits no distension. There is no tenderness. There is no rebound and no guarding.  Musculoskeletal: Normal range of motion. He exhibits no edema or tenderness.  Moves all extremities well.   Neurological: He is alert and oriented to person, place, and time. He has normal strength. No cranial nerve deficit.  Skin: Skin is warm, dry and intact. No rash noted. No erythema. No pallor.  Psychiatric: He has a normal mood and affect. His speech is normal and behavior is normal. His mood appears not anxious.  Nursing note and vitals reviewed.    ED Treatments / Results  Labs (all labs ordered are listed, but only abnormal results are displayed) Results for orders placed or performed during the hospital encounter of 01/31/16  Basic metabolic panel  Result Value Ref Range   Sodium 127 (L)  135 - 145 mmol/L   Potassium 3.6 3.5 - 5.1 mmol/L   Chloride 87 (L) 101 - 111 mmol/L   CO2 26 22 - 32 mmol/L   Glucose, Bld 152 (H) 65 - 99 mg/dL   BUN 14 6 - 20 mg/dL   Creatinine, Ser 1.610.77 0.61 - 1.24 mg/dL   Calcium 9.6 8.9 - 09.610.3 mg/dL   GFR calc non Af Amer >60 >60 mL/min   GFR calc Af Amer >60 >60 mL/min   Anion gap 14 5 - 15  CBC  Result Value Ref Range   WBC 19.4 (H) 4.0 - 10.5 K/uL   RBC 4.33 4.22 - 5.81 MIL/uL   Hemoglobin 14.2 13.0 - 17.0 g/dL   HCT 04.540.4 40.939.0 - 81.152.0 %   MCV 93.3 78.0 - 100.0 fL   MCH 32.8 26.0 - 34.0 pg   MCHC 35.1 30.0 - 36.0 g/dL   RDW 91.412.7 78.211.5 - 95.615.5 %   Platelets 208 150 - 400 K/uL  Blood gas, arterial  Result Value Ref Range   FIO2 21.00    Delivery systems ROOM AIR    pH, Arterial 7.428 7.350 - 7.450   pCO2 arterial 40.7 32.0 - 48.0 mmHg   pO2, Arterial 67.9 (L) 83.0 - 108.0 mmHg   Bicarbonate 26.4 20.0 - 28.0 mmol/L   Acid-Base Excess 2.5 (H) 0.0 - 2.0 mmol/L   O2 Saturation 92.7 %   Collection site RIGHT RADIAL    Drawn by 22223    Sample type ARTERIAL    Allens test (pass/fail) PASS PASS  Brain natriuretic peptide  Result Value Ref Range   B Natriuretic Peptide 161.0 (H) 0.0 - 100.0 pg/mL  Troponin I  Result Value Ref Range   Troponin I 0.35 (HH) <0.03 ng/mL  Differential  Result Value Ref Range   Neutrophils Relative % 90 %   Neutro Abs 12.9 (H) 1.7 - 7.7 K/uL   Lymphocytes Relative 2 %   Lymphs Abs 0.4 (L) 0.7 - 4.0 K/uL   Monocytes Relative 8 %   Monocytes Absolute 1.1 (H) 0.1 - 1.0 K/uL   Eosinophils Relative 0 %   Eosinophils Absolute 0.0 0.0 - 0.7  K/uL   Basophils Relative 0 %   Basophils Absolute 0.0 0.0 - 0.1 K/uL  Magnesium  Result Value Ref Range   Magnesium 1.7 1.7 - 2.4 mg/dL  Phosphorus  Result Value Ref Range   Phosphorus 2.5 2.5 - 4.6 mg/dL  Lactic acid, plasma  Result Value Ref Range   Lactic Acid, Venous 1.7 0.5 - 1.9 mmol/L  Troponin I  Result Value Ref Range   Troponin I 1.02 (HH) <0.03 ng/mL    APTT  Result Value Ref Range   aPTT 42 (H) 24 - 36 seconds  Protime-INR  Result Value Ref Range   Prothrombin Time 14.9 11.4 - 15.2 seconds   INR 1.17   I-Stat CG4 Lactic Acid, ED  Result Value Ref Range   Lactic Acid, Venous 3.05 (HH) 0.5 - 1.9 mmol/L   Comment NOTIFIED PHYSICIAN   I-Stat CG4 Lactic Acid, ED  Result Value Ref Range   Lactic Acid, Venous 1.48 0.5 - 1.9 mmol/L   Laboratory interpretation all normal except Positive troponin, positive lactic acid improved during treatment leukocytosis, hyperglycemia, hyponatremia   EKG  EKG Interpretation  Date/Time:  Sunday January 31 2016 22:44:00 EST Ventricular Rate:  132 PR Interval:    QRS Duration: 103 QT Interval:  299 QTC Calculation: 443 R Axis:   85 Text Interpretation:  Sinus tachycardia Multiple ventricular premature complexes Borderline right axis deviation Probable anteroseptal infarct, old Minimal ST depression, inferior leads No STEMI.  Confirmed by LONG MD, JOSHUA [phone]) on 01/31/2016 10:56:32 PM       Radiology Dg Chest Port 1 View  Result Date: 01/31/2016 CLINICAL DATA:  Worsening shortness of breath. EXAM: PORTABLE CHEST 1 VIEW COMPARISON:  CT of the chest 01/12/2015 FINDINGS: Cardiomediastinal silhouette is normal. Mediastinal contours appear intact. There is no evidence of focal airspace consolidation, or pneumothorax. Bilateral small pleural effusions. Osseous structures are without acute abnormality. Soft tissues are grossly normal. IMPRESSION: Bilateral small pleural effusions. No other acute abnormalities radiographically. Electronically Signed   By: Ted Mcalpine M.D.   On: 01/31/2016 23:45    Procedures Procedures (including critical care time)  Medications Ordered in ED Medications  azithromycin (ZITHROMAX) 500 mg in dextrose 5 % 250 mL IVPB (0 mg Intravenous Stopped 02/01/16 0222)  heparin bolus via infusion 4,000 Units (4,000 Units Intravenous Bolus from Bag 02/01/16 0404)     Followed by  heparin ADULT infusion 100 units/mL (25000 units/268mL sodium chloride 0.45%) (1,100 Units/hr Intravenous Transfusing/Transfer 02/01/16 0441)  ipratropium-albuterol (DUONEB) 0.5-2.5 (3) MG/3ML nebulizer solution 3 mL (3 mLs Nebulization Given 01/31/16 2249)  albuterol (PROVENTIL) (2.5 MG/3ML) 0.083% nebulizer solution 2.5 mg (2.5 mg Nebulization Given 01/31/16 2249)  albuterol (PROVENTIL,VENTOLIN) solution continuous neb (10 mg/hr Nebulization Given 01/31/16 2319)  ipratropium (ATROVENT) nebulizer solution 0.5 mg (0.5 mg Nebulization Given 01/31/16 2319)  methylPREDNISolone sodium succinate (SOLU-MEDROL) 125 mg/2 mL injection 125 mg (125 mg Intravenous Given 01/31/16 2326)  magnesium sulfate IVPB 2 g 50 mL (0 g Intravenous Stopped 02/01/16 0026)  sodium chloride 0.9 % bolus 1,000 mL (0 mLs Intravenous Stopped 02/01/16 0031)  sodium chloride 0.9 % bolus 500 mL (0 mLs Intravenous Stopped 02/01/16 0140)  furosemide (LASIX) injection 60 mg (60 mg Intravenous Given 02/01/16 0040)  cefTRIAXone (ROCEPHIN) 1 g in dextrose 5 % 50 mL IVPB (0 g Intravenous Stopped 02/01/16 0111)  aspirin chewable tablet 324 mg (324 mg Oral Given 02/01/16 0350)  nitroGLYCERIN 50 mg in dextrose 5 % 250 mL (0.2 mg/mL) infusion (0 mcg/min Intravenous Paused  02/01/16 0409)     Initial Impression / Assessment and Plan / ED Course  I have reviewed the triage vital signs and the nursing notes.  Pertinent labs & imaging results that were available during my care of the patient were reviewed by me and considered in my medical decision making (see chart for details).  Clinical Course   DIAGNOSTIC STUDIES: Oxygen Saturation is 97% on RA, normal by my interpretation.  COORDINATION OF CARE: 11:10 PM-Will order imaging and breathing treatment. Discussed treatment plan with pt at bedside and pt agreed to plan. Patient had just had a nebulizer with minimal improvement. He was started on a continuous nebulizer.  11:57  PM- Pt's symptoms not improving on continuous neb. On recheck he still appears very tachypneic with intercostal retractions.[name]. He is very anxious about the mask on his face. He still has half of the continuous nebulizer to finish. At this point I felt patient would probably need to be on BiPAP. Although he states the mask makes him feel more claustrophobic patient is still extremely tight. On exam he is not moving any air. His ABG is worrisome because his pH and PCO2 are normal. Patient is very tachypneic and  I would expect to see a respiratory alkalosis being present. BiPAP was ordered.  1:15 AM recheck. Patient seems to finally be feeling a little bit better area he is on the BiPAP still. On reexam he now have some improved air movement. Still however appears to be tight. We discussed admission and he is agreeable. Patient's troponin is positive. I suspect with his hypoxia and tachycardia on arrival he had a demand ischemia. Patient states he only gets chest discomfort when he is very short of breath. He does not actually describe chest pain. His EKG does not have any acute ischemic changes. He does have some minor ST depression but that is consistent with the tachycardia that he had.  01:48 AM Dr Robb Matarrtiz, Dr. Robb Matarrtiz is going to do a second troponin. If his troponin is rising he will need to be transferred to [facility] for admission. We will not have any cardiology coverage here for another 24 hours. However if his troponin is the same he can stay at our facility.  03:17 AM Dr Robb Matarrtiz, hospitalist, states the second troponin is more positive. He wants me to talk to cardiology to see if they want to consult or admit [facility].  03:38 AM Dr Sharyn LullHarwani, accepts [facility]. Wants him started on NTG and heparin drips. Pt given ASA.   Final Clinical Impressions(s) / ED Diagnoses   Final diagnoses:  COPD exacerbation (HCC)  Troponin level elevated  Hypoxia  NSTEMI (non-ST elevated myocardial  infarction) [facility](HCC)   [facility]  CRITICAL CARE Performed by: Milca Sytsma L Sofia Jaquith Total critical care time: 50 minutes Critical care time was exclusive of separately billable procedures and treating other patients. Critical care was necessary to treat or prevent imminent or life-threatening deterioration. Critical care was time spent personally by me on the following activities: development of treatment plan with patient and/or surrogate as well as nursing, discussions with consultants, evaluation of patient's response to treatment, examination of patient, obtaining history from patient or surrogate, ordering and performing treatments and interventions, ordering and review of laboratory studies, ordering and review of radiographic studies, pulse oximetry and re-evaluation of patient's condition.  Devoria AlbeIva Roark Rufo, MD, FACEP  I personally performed the services described in this documentation, which was scribed  in my presence. The recorded information has been reviewed and considered.  Devoria Albe, MD, Concha Pyo, MD 02/01/16 [id number]

## 2016-01-31 NOTE — ED Triage Notes (Signed)
Pt c/o sob, chest pain that started a few days ago, pulse ox on room air 80%,

## 2016-02-01 ENCOUNTER — Encounter (HOSPITAL_COMMUNITY): Payer: Self-pay | Admitting: Internal Medicine

## 2016-02-01 DIAGNOSIS — E785 Hyperlipidemia, unspecified: Secondary | ICD-10-CM | POA: Diagnosis present

## 2016-02-01 DIAGNOSIS — I214 Non-ST elevation (NSTEMI) myocardial infarction: Secondary | ICD-10-CM | POA: Diagnosis present

## 2016-02-01 DIAGNOSIS — Z79899 Other long term (current) drug therapy: Secondary | ICD-10-CM | POA: Diagnosis not present

## 2016-02-01 DIAGNOSIS — J441 Chronic obstructive pulmonary disease with (acute) exacerbation: Secondary | ICD-10-CM | POA: Diagnosis present

## 2016-02-01 DIAGNOSIS — K760 Fatty (change of) liver, not elsewhere classified: Secondary | ICD-10-CM | POA: Diagnosis present

## 2016-02-01 DIAGNOSIS — Z87891 Personal history of nicotine dependence: Secondary | ICD-10-CM | POA: Diagnosis not present

## 2016-02-01 DIAGNOSIS — I350 Nonrheumatic aortic (valve) stenosis: Secondary | ICD-10-CM | POA: Diagnosis present

## 2016-02-01 DIAGNOSIS — I714 Abdominal aortic aneurysm, without rupture: Secondary | ICD-10-CM | POA: Diagnosis present

## 2016-02-01 DIAGNOSIS — R7303 Prediabetes: Secondary | ICD-10-CM | POA: Diagnosis present

## 2016-02-01 DIAGNOSIS — K5900 Constipation, unspecified: Secondary | ICD-10-CM | POA: Diagnosis present

## 2016-02-01 DIAGNOSIS — Z7982 Long term (current) use of aspirin: Secondary | ICD-10-CM | POA: Diagnosis not present

## 2016-02-01 DIAGNOSIS — R0902 Hypoxemia: Secondary | ICD-10-CM | POA: Diagnosis present

## 2016-02-01 DIAGNOSIS — J440 Chronic obstructive pulmonary disease with acute lower respiratory infection: Secondary | ICD-10-CM | POA: Diagnosis present

## 2016-02-01 DIAGNOSIS — I10 Essential (primary) hypertension: Secondary | ICD-10-CM | POA: Diagnosis present

## 2016-02-01 DIAGNOSIS — J189 Pneumonia, unspecified organism: Secondary | ICD-10-CM | POA: Diagnosis present

## 2016-02-01 LAB — BLOOD GAS, ARTERIAL
Acid-Base Excess: 2.5 mmol/L — ABNORMAL HIGH (ref 0.0–2.0)
Bicarbonate: 26.4 mmol/L (ref 20.0–28.0)
Drawn by: 22223
FIO2: 21
O2 Saturation: 92.7 %
pCO2 arterial: 40.7 mmHg (ref 32.0–48.0)
pH, Arterial: 7.428 (ref 7.350–7.450)
pO2, Arterial: 67.9 mmHg — ABNORMAL LOW (ref 83.0–108.0)

## 2016-02-01 LAB — COMPREHENSIVE METABOLIC PANEL
ALT: 31 U/L (ref 17–63)
AST: 58 U/L — ABNORMAL HIGH (ref 15–41)
Albumin: 2.7 g/dL — ABNORMAL LOW (ref 3.5–5.0)
Alkaline Phosphatase: 74 U/L (ref 38–126)
Anion gap: 13 (ref 5–15)
BUN: 11 mg/dL (ref 6–20)
CO2: 26 mmol/L (ref 22–32)
Calcium: 8.9 mg/dL (ref 8.9–10.3)
Chloride: 94 mmol/L — ABNORMAL LOW (ref 101–111)
Creatinine, Ser: 0.7 mg/dL (ref 0.61–1.24)
GFR calc Af Amer: >60 mL/min (ref >60)
GFR calc non Af Amer: >60 mL/min (ref >60)
Glucose, Bld: 165 mg/dL — ABNORMAL HIGH (ref 65–99)
Potassium: 4.6 mmol/L (ref 3.5–5.1)
Sodium: 133 mmol/L — ABNORMAL LOW (ref 135–145)
Total Bilirubin: 0.9 mg/dL (ref 0.3–1.2)
Total Protein: 6.6 g/dL (ref 6.5–8.1)

## 2016-02-01 LAB — TROPONIN I
Troponin I: 0.35 ng/mL (ref <0.03)
Troponin I: 1.02 ng/mL (ref <0.03)
Troponin I: 1.49 ng/mL (ref <0.03)
Troponin I: 1.64 ng/mL (ref <0.03)
Troponin I: 1.61 ng/mL (ref <0.03)

## 2016-02-01 LAB — BRAIN NATRIURETIC PEPTIDE: B Natriuretic Peptide: 161 pg/mL — ABNORMAL HIGH (ref 0.0–100.0)

## 2016-02-01 LAB — I-STAT CG4 LACTIC ACID, ED: Lactic Acid, Venous: 1.48 mmol/L (ref 0.5–1.9)

## 2016-02-01 LAB — GLUCOSE, CAPILLARY
Glucose-Capillary: 167 mg/dL — ABNORMAL HIGH (ref 65–99)
Glucose-Capillary: 147 mg/dL — ABNORMAL HIGH (ref 65–99)
Glucose-Capillary: 154 mg/dL — ABNORMAL HIGH (ref 65–99)

## 2016-02-01 LAB — PROTIME-INR
INR: 1.17
Prothrombin Time: 14.9 seconds (ref 11.4–15.2)

## 2016-02-01 LAB — DIFFERENTIAL
Basophils Absolute: 0 10*3/uL (ref 0.0–0.1)
Basophils Relative: 0 %
Eosinophils Absolute: 0 10*3/uL (ref 0.0–0.7)
Eosinophils Relative: 0 %
Lymphocytes Relative: 2 %
Lymphs Abs: 0.4 10*3/uL — ABNORMAL LOW (ref 0.7–4.0)
Monocytes Absolute: 1.1 10*3/uL — ABNORMAL HIGH (ref 0.1–1.0)
Monocytes Relative: 8 %
Neutro Abs: 12.9 10*3/uL — ABNORMAL HIGH (ref 1.7–7.7)
Neutrophils Relative %: 90 %

## 2016-02-01 LAB — APTT: aPTT: 42 seconds — ABNORMAL HIGH (ref 24–36)

## 2016-02-01 LAB — MRSA PCR SCREENING: MRSA by PCR: NEGATIVE

## 2016-02-01 LAB — LACTIC ACID, PLASMA: Lactic Acid, Venous: 1.7 mmol/L (ref 0.5–1.9)

## 2016-02-01 LAB — PHOSPHORUS: Phosphorus: 2.5 mg/dL (ref 2.5–4.6)

## 2016-02-01 LAB — HEPARIN LEVEL (UNFRACTIONATED)
Heparin Unfractionated: 0.2 IU/mL — ABNORMAL LOW (ref 0.30–0.70)
Heparin Unfractionated: 0.1 IU/mL — ABNORMAL LOW (ref 0.30–0.70)

## 2016-02-01 LAB — MAGNESIUM
Magnesium: 1.7 mg/dL (ref 1.7–2.4)
Magnesium: 2 mg/dL (ref 1.7–2.4)

## 2016-02-01 MED ORDER — INSULIN ASPART 100 UNIT/ML ~~LOC~~ SOLN
0.0000 [IU] | Freq: Three times a day (TID) | SUBCUTANEOUS | Status: DC
  Administered 2016-02-01: 2 [IU] via SUBCUTANEOUS
  Administered 2016-02-01 – 2016-02-02 (×2): 1 [IU] via SUBCUTANEOUS

## 2016-02-01 MED ORDER — FUROSEMIDE 10 MG/ML IJ SOLN
60.0000 mg | Freq: Once | INTRAMUSCULAR | Status: AC
  Administered 2016-02-01: 60 mg via INTRAVENOUS
  Filled 2016-02-01: qty 6

## 2016-02-01 MED ORDER — SODIUM CHLORIDE 0.9% FLUSH
3.0000 mL | Freq: Two times a day (BID) | INTRAVENOUS | Status: DC
  Administered 2016-02-01: 3 mL via INTRAVENOUS

## 2016-02-01 MED ORDER — ASPIRIN 300 MG RE SUPP: 300.0000 mg | RECTAL | Status: AC

## 2016-02-01 MED ORDER — SODIUM CHLORIDE 0.9% FLUSH
3.0000 mL | INTRAVENOUS | Status: DC | PRN
Start: 2016-02-01 — End: 2016-02-02

## 2016-02-01 MED ORDER — DEXTROSE 5 % IV SOLN
500.0000 mg | INTRAVENOUS | Status: DC
  Administered 2016-02-01 – 2016-02-04 (×4): 500 mg via INTRAVENOUS
  Filled 2016-02-01 (×5): qty 500

## 2016-02-01 MED ORDER — ASPIRIN EC 81 MG PO TBEC
81.0000 mg | DELAYED_RELEASE_TABLET | Freq: Every day | ORAL | Status: DC
  Administered 2016-02-02 – 2016-02-05 (×4): 81 mg via ORAL
  Filled 2016-02-01 (×4): qty 1

## 2016-02-01 MED ORDER — HEPARIN (PORCINE) IN NACL 100-0.45 UNIT/ML-% IJ SOLN
1100.0000 [IU]/h | INTRAMUSCULAR | Status: DC
  Administered 2016-02-01: 1100 [IU]/h via INTRAVENOUS
  Filled 2016-02-01: qty 250

## 2016-02-01 MED ORDER — HEPARIN BOLUS VIA INFUSION
4000.0000 [IU] | Freq: Once | INTRAVENOUS | Status: AC
  Administered 2016-02-01: 4000 [IU] via INTRAVENOUS

## 2016-02-01 MED ORDER — SODIUM CHLORIDE 0.9 % WEIGHT BASED INFUSION
1.0000 mL/kg/h | INTRAVENOUS | Status: DC
  Administered 2016-02-01: 1 mL/kg/h via INTRAVENOUS

## 2016-02-01 MED ORDER — ONDANSETRON HCL 4 MG/2ML IJ SOLN: 4.0000 mg | Freq: Four times a day (QID) | INTRAMUSCULAR | Status: DC | PRN

## 2016-02-01 MED ORDER — NITROGLYCERIN IN D5W 200-5 MCG/ML-% IV SOLN: 5.0000 ug/min | INTRAVENOUS | Status: DC

## 2016-02-01 MED ORDER — METOPROLOL TARTRATE 12.5 MG HALF TABLET
12.5000 mg | ORAL_TABLET | Freq: Two times a day (BID) | ORAL | Status: DC
  Administered 2016-02-01 – 2016-02-05 (×8): 12.5 mg via ORAL
  Filled 2016-02-01 (×8): qty 1

## 2016-02-01 MED ORDER — ACETAMINOPHEN 325 MG PO TABS
650.0000 mg | ORAL_TABLET | ORAL | Status: DC | PRN
  Administered 2016-02-02 – 2016-02-05 (×10): 650 mg via ORAL
  Filled 2016-02-01 (×10): qty 2

## 2016-02-01 MED ORDER — SODIUM CHLORIDE 0.9 % IV BOLUS (SEPSIS): 1000.0000 mL | Freq: Once | INTRAVENOUS | Status: DC

## 2016-02-01 MED ORDER — PANTOPRAZOLE SODIUM 40 MG PO TBEC
40.0000 mg | DELAYED_RELEASE_TABLET | Freq: Every day | ORAL | Status: DC
  Administered 2016-02-02 – 2016-02-05 (×4): 40 mg via ORAL
  Filled 2016-02-01 (×4): qty 1

## 2016-02-01 MED ORDER — ASPIRIN 81 MG PO CHEW: 324.0000 mg | CHEWABLE_TABLET | ORAL | Status: AC

## 2016-02-01 MED ORDER — ORAL CARE MOUTH RINSE
15.0000 mL | Freq: Two times a day (BID) | OROMUCOSAL | Status: DC
  Administered 2016-02-01 – 2016-02-04 (×3): 15 mL via OROMUCOSAL

## 2016-02-01 MED ORDER — CHLORHEXIDINE GLUCONATE 0.12 % MT SOLN
15.0000 mL | Freq: Two times a day (BID) | OROMUCOSAL | Status: DC
  Administered 2016-02-01 – 2016-02-05 (×4): 15 mL via OROMUCOSAL
  Filled 2016-02-01 (×4): qty 15

## 2016-02-01 MED ORDER — LEVALBUTEROL HCL 1.25 MG/0.5ML IN NEBU
1.2500 mg | INHALATION_SOLUTION | Freq: Four times a day (QID) | RESPIRATORY_TRACT | Status: DC
  Administered 2016-02-01 – 2016-02-03 (×7): 1.25 mg via RESPIRATORY_TRACT
  Filled 2016-02-01 (×7): qty 0.5

## 2016-02-01 MED ORDER — NITROGLYCERIN IN D5W 200-5 MCG/ML-% IV SOLN
5.0000 ug/min | Freq: Once | INTRAVENOUS | Status: AC
  Administered 2016-02-01: 5 ug/min via INTRAVENOUS
  Filled 2016-02-01: qty 250

## 2016-02-01 MED ORDER — DEXTROSE 5 % IV SOLN
1.0000 g | Freq: Once | INTRAVENOUS | Status: AC
  Administered 2016-02-01: 1 g via INTRAVENOUS
  Filled 2016-02-01: qty 10

## 2016-02-01 MED ORDER — CLOPIDOGREL BISULFATE 300 MG PO TABS
300.0000 mg | ORAL_TABLET | Freq: Once | ORAL | Status: AC
  Administered 2016-02-01: 300 mg via ORAL
  Filled 2016-02-01: qty 1

## 2016-02-01 MED ORDER — SODIUM CHLORIDE 0.9 % IV SOLN: 250.0000 mL | INTRAVENOUS | Status: DC | PRN

## 2016-02-01 MED ORDER — ATORVASTATIN CALCIUM 40 MG PO TABS
40.0000 mg | ORAL_TABLET | Freq: Every day | ORAL | Status: DC
  Administered 2016-02-01 – 2016-02-04 (×3): 40 mg via ORAL
  Filled 2016-02-01 (×3): qty 1

## 2016-02-01 MED ORDER — ASPIRIN 81 MG PO CHEW
324.0000 mg | CHEWABLE_TABLET | Freq: Once | ORAL | Status: AC
  Administered 2016-02-01: 324 mg via ORAL
  Filled 2016-02-01: qty 4

## 2016-02-01 MED ORDER — NITROGLYCERIN 0.4 MG SL SUBL: 0.4000 mg | SUBLINGUAL_TABLET | SUBLINGUAL | Status: DC | PRN

## 2016-02-01 MED ORDER — HEPARIN (PORCINE) IN NACL 100-0.45 UNIT/ML-% IJ SOLN
1600.0000 [IU]/h | INTRAMUSCULAR | Status: DC
  Administered 2016-02-01: 1600 [IU]/h via INTRAVENOUS
  Filled 2016-02-01: qty 250

## 2016-02-01 MED ORDER — SODIUM CHLORIDE 0.9 % IV SOLN
INTRAVENOUS | Status: DC
  Administered 2016-02-01 – 2016-02-03 (×2): via INTRAVENOUS

## 2016-02-01 MED ORDER — SODIUM CHLORIDE 0.9 % IV BOLUS (SEPSIS): 500.0000 mL | Freq: Once | INTRAVENOUS | Status: DC

## 2016-02-01 NOTE — Progress Notes (Signed)
ANTICOAGULATION CONSULT NOTE   Pharmacy Consult for heparin Indication: chest pain/ACS  Assessment: 65 yo male hx COPD, AAA, HTN to ED with SOB and [facility] and up trending troponin. Pharmacy consulted to dose heparin gtt.  Follow-up heparin level post dose increase is below goal at 0.10 with no interruptions in gtt per RN and confirmed rate of 1300 units/hr. CBC wnl and no reported s/s bleeding.   Goal of Therapy:  Heparin level 0.3-0.7 units/ml   Plan:  - Increase heparin gtt to 1600 units/hr - HL in 6 hours at 0200 (12/26) - Monitor HL/CBC daily - F/u plans for cath in the am   Allie BossierApryl Lynzie Cliburn, PharmD PGY1 Pharmacy Resident [phone] (Pager) 02/01/2016 6:40 PM

## 2016-02-01 NOTE — ED Notes (Signed)
CRITICAL VALUE ALERT  Critical value received:  Troponin 0.35  Date of notification:  02/01/2016  Time of notification:  0111  Critical value read back:Yes.    Nurse who received alert:  Clarene Reamerasey Iram Astorino, RN  MD notified (1st page):  Lynelle DoctorKnapp  Time of first page:  0112  MD notified (2nd page):  Time of second page:  Responding MD:  Lynelle DoctorKnapp  Time MD responded:  [phone]

## 2016-02-01 NOTE — ED Notes (Signed)
CRITICAL VALUE ALERT  Critical value received:  Troponin 1.02  Date of notification:  02/01/2016  Time of notification:  0302  Critical value read back:Yes.    Nurse who received alert:  Clarene Reamerasey Perina Salvaggio RN  MD notified (1st page):  Lynelle DoctorKnapp  Time of first page:  0303  MD notified (2nd page):  Time of second page:  Responding MD:  Lynelle DoctorKnapp  Time MD responded:  [phone]

## 2016-02-01 NOTE — Progress Notes (Signed)
ANTICOAGULATION CONSULT NOTE   Pharmacy Consult for heparin Indication: chest pain/ACS  No Known Allergies  Patient Measurements: Height: 5\' 9"  (175.3 cm) Weight: 192 lb 14.4 oz (87.5 kg) IBW/kg (Calculated) : 70.7 HEPARIN DW (KG): 88.7   Vital Signs: Temp: 98.3 F (36.8 C) (12/25 1136) Temp Source: Oral (12/25 1136) BP: 115/80 (12/25 1143) Pulse Rate: 88 (12/25 1143)  Labs:  Recent Labs  01/31/16 2245 02/01/16 0214 02/01/16 1057  HGB 14.2  --   --   HCT 40.4  --   --   PLT 208  --   --   APTT  --  42*  --   LABPROT  --  14.9  --   INR  --  1.17  --   HEPARINUNFRC  --   --  0.20*  CREATININE 0.77  --  0.70  TROPONINI 0.35* 1.02* 1.49*   Estimated Creatinine Clearance: 103.5 mL/min (by C-G formula based on SCr of 0.7 mg/dL).  Medical History: Past Medical History:  Diagnosis Date  . AAA (abdominal aortic aneurysm) (HCC)   . COPD (chronic obstructive pulmonary disease) (HCC)   . Fatty liver   . Hyperlipidemia   . Hypertension   . White coat hypertension     Medications:  Prescriptions Prior to Admission  Medication Sig Dispense Refill Last Dose  . albuterol (VENTOLIN HFA) 108 (90 Base) MCG/ACT inhaler INHALE TWO PUFFS BY MOUTH EVERY 4 HOURS AS NEEDED 18 each 5 01/31/2016 at Unknown time  . aspirin 81 MG tablet Take 81 mg by mouth daily.   01/31/2016 at Unknown time  . atorvastatin (LIPITOR) 40 MG tablet Take 1 tablet (40 mg total) by mouth daily. 30 tablet 5 01/31/2016 at Unknown time  . fexofenadine (ALLEGRA) 180 MG tablet Take 180 mg by mouth daily.   01/31/2016 at Unknown time  . fish oil-omega-3 fatty acids 1000 MG capsule Take 2 g by mouth daily.   01/31/2016 at --------------------------------------------  . Fluticasone-Salmeterol (ADVAIR DISKUS) 100-50 MCG/DOSE AEPB INHALE 1 DOSE BY MOUTH TWICE DAILY 60 each 5 01/31/2016 at ----  . lisinopril-hydrochlorothiazide (PRINZIDE,ZESTORETIC) 20-12.5 MG tablet TAKE 1 TABLET BY MOUTH EVERY DAY (DISCONTINUE  LISINOPRIL) (Patient taking differently: Take 1 tablet by mouth daily. ) 30 tablet 5 01/31/2016 at Unknown time  . mometasone (NASONEX) 50 MCG/ACT nasal spray Place 2 sprays into the nose daily. 48 g 11 01/30/2016 at Unknown time  . Multiple Vitamin (MULTIVITAMIN) tablet Take 1 tablet by mouth daily.   01/31/2016 at Unknown time  . tiotropium (SPIRIVA HANDIHALER) 18 MCG inhalation capsule INHALE 1 DOSE ONCE DAILY 30 capsule 5 01/31/2016 at Unknown time  . vitamin C (ASCORBIC ACID) 500 MG tablet Take 500 mg by mouth daily. Reported on 07/16/2015   01/31/2016 at Unknown time   Scheduled:  . aspirin  324 mg Oral NOW   Or  . aspirin  300 mg Rectal NOW  . [START ON 02/02/2016] aspirin EC  81 mg Oral Daily  . atorvastatin  40 mg Oral q1800  . azithromycin  500 mg Intravenous Q24H  . chlorhexidine  15 mL Mouth Rinse BID  . insulin aspart  0-9 Units Subcutaneous TID WC  . levalbuterol  1.25 mg Nebulization Q6H  . mouth rinse  15 mL Mouth Rinse q12n4p  . metoprolol tartrate  12.5 mg Oral BID  . pantoprazole  40 mg Oral Q0600  . sodium chloride flush  3 mL Intravenous Q12H   Infusions:  . sodium chloride 50 mL/hr at 02/01/16  1032  . sodium chloride    . heparin    . nitroGLYCERIN     PRN:  Anti-infectives    Start     Dose/Rate Route Frequency Ordered Stop   02/01/16 0045  cefTRIAXone (ROCEPHIN) 1 g in dextrose 5 % 50 mL IVPB     1 g 100 mL/hr over 30 Minutes Intravenous  Once 02/01/16 0030 02/01/16 0111   02/01/16 0045  azithromycin (ZITHROMAX) 500 mg in dextrose 5 % 250 mL IVPB     500 mg 250 mL/hr over 60 Minutes Intravenous Every 24 hours 02/01/16 0030        Assessment: 65 yo male hx COPD, AAA, HTN to ED with SOB and [facility] ACS and up trending troponin. Pharmacy consulted to dose heparin gtt.  Initial heparin level below goal at 0.2 with no interruptions in gtt per RN. CBC wnl and no reported s/s bleeding.   Goal of Therapy:  Heparin level 0.3-0.7  units/ml   Plan:  - Increase heparin gtt to 1300 units/hr - HL in 6 hours - Monitor HL/CBC daily - F/u plans for cath in the am   Kellie Chisolm K. Bonnye FavaNicolsen, PharmD, BCPS, CPP Clinical Pharmacist Pager: [phone] Phone: [phone] 02/01/2016 12:49 PM

## 2016-02-01 NOTE — Progress Notes (Signed)
Pt taken off Bipap and placed on a Salter HFNC, 5 Lpm. Pt tolerating it well at this time. No increased WOB or SOB.

## 2016-02-01 NOTE — H&P (Signed)
[name] is an 65 y.o. male.   Chief Complaint: Chest tightness associated with progressive increasing shortness of breath HPI: Patient is 65 year old male with past medical history significant for hypertension, hyperlipidemia, COPD, remote tobacco abuse smoked approximately 1-1/2 pack per day for 25 years quit in 2004, abdominal aortic aneurysm, history of fatty liver, EtOH abuse, was transferred from [facility]. Patient complained of retrosternal chest pain described as tightness radiating occasionally to the left shoulder off and on associated with progressive increasing shortness of breath and chest congestion for last few days. Patient was initially placed on BiPAP received nebulizer treatment IV Solu-Medrol and antibiotics with improvement in his breathing and was noted to have elevated troponin I. Patient denies any fever but complained of chills and diaphoresis. Denies any cardiac workup in the past. States his aneurysm has been stable and has seen vascular surgery recently.  Past Medical History:  Diagnosis Date  . AAA (abdominal aortic aneurysm) ([place])   . COPD (chronic obstructive pulmonary disease) ([place])   . Fatty liver   . Hyperlipidemia   . Hypertension   . White coat hypertension     Past Surgical History:  Procedure Laterality Date  . ANKLE SURGERY Right 2009  . APPENDECTOMY    . COLONOSCOPY  06/22/2010   EPP:IRJJOACZYS rectal and right colon polyps removed remainder rectum and colon appared normal  . COLONOSCOPY WITH PROPOFOL N/A 05/15/2014   Procedure: COLONOSCOPY WITH PROPOFOL;  Surgeon: Daneil Dolin, MD;  Location: [facility];  Service: Endoscopy;  Laterality: N/A;  In cecum @ [id number], out @ 0937, withdrawal time 19 minutes  . POLYPECTOMY N/A 05/15/2014   Procedure: POLYPECTOMY;  Surgeon: Daneil Dolin, MD;  Location: [facility];  Service: Endoscopy;  Laterality: N/A;    Family History  Problem Relation Age of Onset  . Hypertension Sister   . AAA (abdominal  aortic aneurysm) Brother   . Colon cancer Neg Hx   . Liver disease Neg Hx    Social History:  reports that he quit smoking about 13 years ago. His smoking use included Cigarettes. He has a 30.00 pack-year smoking history. He has never used smokeless tobacco. He reports that he drinks alcohol. He reports that he does not use drugs.  Allergies: No Known Allergies  Medications Prior to Admission  Medication Sig Dispense Refill  . albuterol (VENTOLIN HFA) 108 (90 Base) MCG/ACT inhaler INHALE TWO PUFFS BY MOUTH EVERY 4 HOURS AS NEEDED 18 each 5  . aspirin 81 MG tablet Take 81 mg by mouth daily.    [name] atorvastatin (LIPITOR) 40 MG tablet Take 1 tablet (40 mg total) by mouth daily. 30 tablet 5  . fexofenadine (ALLEGRA) 180 MG tablet Take 180 mg by mouth daily.    . fish oil-omega-3 fatty acids 1000 MG capsule Take 2 g by mouth daily.    . Fluticasone-Salmeterol (ADVAIR DISKUS) 100-50 MCG/DOSE AEPB INHALE 1 DOSE BY MOUTH TWICE DAILY 60 each 5  . lisinopril-hydrochlorothiazide (PRINZIDE,ZESTORETIC) 20-12.5 MG tablet TAKE 1 TABLET BY MOUTH EVERY DAY (DISCONTINUE LISINOPRIL) (Patient taking differently: Take 1 tablet by mouth daily. ) 30 tablet 5  . mometasone (NASONEX) 50 MCG/ACT nasal spray Place 2 sprays into the nose daily. 48 g 11  . Multiple Vitamin (MULTIVITAMIN) tablet Take 1 tablet by mouth daily.    [name] tiotropium (SPIRIVA HANDIHALER) 18 MCG inhalation capsule INHALE 1 DOSE ONCE DAILY 30 capsule 5  . vitamin C (ASCORBIC ACID) 500 MG tablet Take 500 mg by mouth  daily. Reported on 07/16/2015      Results for orders placed or performed during the hospital encounter of 01/31/16 (from the past 48 hour(s))  Basic metabolic panel     Status: Abnormal   Collection Time: 01/31/16 10:45 PM  Result Value Ref Range   Sodium 127 (L) 135 - 145 mmol/L   Potassium 3.6 3.5 - 5.1 mmol/L   Chloride 87 (L) 101 - 111 mmol/L   CO2 26 22 - 32 mmol/L   Glucose, Bld 152 (H) 65 - 99 mg/dL   BUN 14 6 - 20 mg/dL    Creatinine, Ser 0.77 0.61 - 1.24 mg/dL   Calcium 9.6 8.9 - 10.3 mg/dL   GFR calc non Af Amer >60 >60 mL/min   GFR calc Af Amer >60 >60 mL/min    Comment: (NOTE) The eGFR has been calculated using the CKD EPI equation. This calculation has not been validated in all clinical situations. eGFR's persistently <60 mL/min signify possible Chronic Kidney Disease.    Anion gap 14 5 - 15  CBC     Status: Abnormal   Collection Time: 01/31/16 10:45 PM  Result Value Ref Range   WBC 19.4 (H) 4.0 - 10.5 K/uL   RBC 4.33 4.22 - 5.81 MIL/uL   Hemoglobin 14.2 13.0 - 17.0 g/dL   HCT 40.4 39.0 - 52.0 %   MCV 93.3 78.0 - 100.0 fL   MCH 32.8 26.0 - 34.0 pg   MCHC 35.1 30.0 - 36.0 g/dL   RDW 12.7 11.5 - 15.5 %   Platelets 208 150 - 400 K/uL  Brain natriuretic peptide     Status: Abnormal   Collection Time: 01/31/16 10:45 PM  Result Value Ref Range   B Natriuretic Peptide 161.0 (H) 0.0 - 100.0 pg/mL  Troponin I     Status: Abnormal   Collection Time: 01/31/16 10:45 PM  Result Value Ref Range   Troponin I 0.35 (HH) <0.03 ng/mL    Comment: CRITICAL RESULT CALLED TO, READ BACK BY AND VERIFIED WITH:  VICK,C @ 0111 ON 02/01/16 BY JUW   I-Stat CG4 Lactic Acid, ED     Status: Abnormal   Collection Time: 01/31/16 10:53 PM  Result Value Ref Range   Lactic Acid, Venous 3.05 (HH) 0.5 - 1.9 mmol/L   Comment NOTIFIED PHYSICIAN   Blood gas, arterial     Status: Abnormal   Collection Time: 02/01/16 12:20 AM  Result Value Ref Range   FIO2 21.00    Delivery systems ROOM AIR    pH, Arterial 7.428 7.350 - 7.450   pCO2 arterial 40.7 32.0 - 48.0 mmHg   pO2, Arterial 67.9 (L) 83.0 - 108.0 mmHg   Bicarbonate 26.4 20.0 - 28.0 mmol/L   Acid-Base Excess 2.5 (H) 0.0 - 2.0 mmol/L   O2 Saturation 92.7 %   Collection site RIGHT RADIAL    Drawn by [id number]    Sample type ARTERIAL    Allens test (pass/fail) PASS PASS  I-Stat CG4 Lactic Acid, ED     Status: None   Collection Time: 02/01/16  1:50 AM  Result Value Ref  Range   Lactic Acid, Venous 1.48 0.5 - 1.9 mmol/L  Differential     Status: Abnormal   Collection Time: 02/01/16  2:14 AM  Result Value Ref Range   Neutrophils Relative % 90 %   Neutro Abs 12.9 (H) 1.7 - 7.7 K/uL   Lymphocytes Relative 2 %   Lymphs Abs 0.4 (L) 0.7 - 4.0 K/uL  Monocytes Relative 8 %   Monocytes Absolute 1.1 (H) 0.1 - 1.0 K/uL   Eosinophils Relative 0 %   Eosinophils Absolute 0.0 0.0 - 0.7 K/uL   Basophils Relative 0 %   Basophils Absolute 0.0 0.0 - 0.1 K/uL  Magnesium     Status: None   Collection Time: 02/01/16  2:14 AM  Result Value Ref Range   Magnesium 1.7 1.7 - 2.4 mg/dL  Phosphorus     Status: None   Collection Time: 02/01/16  2:14 AM  Result Value Ref Range   Phosphorus 2.5 2.5 - 4.6 mg/dL  Lactic acid, plasma     Status: None   Collection Time: 02/01/16  2:14 AM  Result Value Ref Range   Lactic Acid, Venous 1.7 0.5 - 1.9 mmol/L  Troponin I     Status: Abnormal   Collection Time: 02/01/16  2:14 AM  Result Value Ref Range   Troponin I 1.02 (HH) <0.03 ng/mL    Comment: CRITICAL RESULT CALLED TO, READ BACK BY AND VERIFIED WITH:  VICK,C @ 0303 ON 02/01/16 BY JUW   APTT     Status: Abnormal   Collection Time: 02/01/16  2:14 AM  Result Value Ref Range   aPTT 42 (H) 24 - 36 seconds    Comment:        IF BASELINE aPTT IS ELEVATED, SUGGEST PATIENT RISK ASSESSMENT BE USED TO DETERMINE APPROPRIATE ANTICOAGULANT THERAPY.   Protime-INR     Status: None   Collection Time: 02/01/16  2:14 AM  Result Value Ref Range   Prothrombin Time 14.9 11.4 - 15.2 seconds   INR 1.17    Dg Chest Port 1 View  Result Date: 01/31/2016 CLINICAL DATA:  Worsening shortness of breath. EXAM: PORTABLE CHEST 1 VIEW COMPARISON:  CT of the chest 01/12/2015 FINDINGS: Cardiomediastinal silhouette is normal. Mediastinal contours appear intact. There is no evidence of focal airspace consolidation, or pneumothorax. Bilateral small pleural effusions. Osseous structures are without acute  abnormality. Soft tissues are grossly normal. IMPRESSION: Bilateral small pleural effusions. No other acute abnormalities radiographically. Electronically Signed   By: Fidela Salisbury M.D.   On: 01/31/2016 23:45    Review of Systems  Constitutional: Positive for diaphoresis. Negative for fever.  Eyes: Positive for double vision.  Respiratory: Positive for cough and shortness of breath.   Cardiovascular: Positive for chest pain. Negative for orthopnea, claudication and leg swelling.  Gastrointestinal: Negative for abdominal pain, nausea and vomiting.  Genitourinary: Negative for dysuria.  Neurological: Negative for dizziness.    Blood pressure 111/78, pulse 93, temperature 97.8 F (36.6 C), temperature source Oral, resp. rate (!) 25, height _0  (1.753 m), weight 197 lb 5 oz (89.5 kg), SpO2 96 %. Physical Exam  Constitutional: He is oriented to person, place, and time.  HENT:  Head: Normocephalic and atraumatic.  Eyes: Conjunctivae are normal. Pupils are equal, round, and reactive to light. Left eye exhibits no discharge. No scleral icterus.  Neck: Normal range of motion. Neck supple. No JVD present. No thyromegaly present.  Cardiovascular: Normal rate and regular rhythm.   Murmur (Soft systolic murmur noted) heard. Respiratory:  Decreased breath sound at bases  GI: Soft. Bowel sounds are normal. He exhibits no distension. There is no tenderness. There is no rebound.  Musculoskeletal: He exhibits no edema, tenderness or deformity.  Neurological: He is alert and oriented to person, place, and time.     Assessment/Plan Acute non-Q-wave myocardial infarction Exacerbation of COPD Hypertension Hyperlipidemia Prediabetic Abdominal aortic  aneurysm Remote tobacco abuse History of fatty liver History of EtOH abuse Plan As per orders Discussed with patient and his wife at length regarding left cardiac catheterization possible PTCA stenting its risk and benefits i.e. death MI  stroke need for emergency CABG local vascular complications etc. and consents for PCI  Charolette Forward, MD 02/01/2016, 9:03 AM

## 2016-02-01 NOTE — Progress Notes (Signed)
ANTICOAGULATION CONSULT NOTE - Preliminary  Pharmacy Consult for heparin Indication: chest pain/ACS  No Known Allergies  Patient Measurements: Height: 5\' 9"  (175.3 cm) Weight: 194 lb (88 kg) IBW/kg (Calculated) : 70.7 HEPARIN DW (KG): 88   Vital Signs: Temp: 98.7 F (37.1 C) (12/24 2242) Temp Source: Oral (12/24 2242) BP: 94/78 (12/25 0330) Pulse Rate: 110 (12/25 0330)  Labs:  Recent Labs  01/31/16 2245 02/01/16 0214  HGB 14.2  --   HCT 40.4  --   PLT 208  --   CREATININE 0.77  --   TROPONINI 0.35* 1.02*   Estimated Creatinine Clearance: 103.7 mL/min (by C-G formula based on SCr of 0.77 mg/dL).  Medical History: Past Medical History:  Diagnosis Date  . AAA (abdominal aortic aneurysm) ([facility])   . COPD (chronic obstructive pulmonary disease) ([facility])   . Fatty liver   . Hyperlipidemia   . Hypertension   . White coat hypertension     Medications:   (Not in a hospital admission) Scheduled:  . heparin  4,000 Units Intravenous Once   Infusions:  . azithromycin Stopped (02/01/16 0222)  . heparin     PRN:  Anti-infectives    Start     Dose/Rate Route Frequency Ordered Stop   02/01/16 0045  cefTRIAXone (ROCEPHIN) 1 g in dextrose 5 % 50 mL IVPB     1 g 100 mL/hr over 30 Minutes Intravenous  Once 02/01/16 0030 02/01/16 0111   02/01/16 0045  azithromycin (ZITHROMAX) 500 mg in dextrose 5 % 250 mL IVPB     500 mg 250 mL/hr over 60 Minutes Intravenous Every 24 hours 02/01/16 0030        Assessment: 65 yo male hx COPD, AAA, HTN to ED with SOB and cough. Received breathing treatments in ED with little to no improvement.  Troponin trending up, starting heparin, NTG and [facility]   Goal of Therapy:  Heparin level 0.3-0.7 units/ml   Plan:  Give 4000 units bolus x 1 Start heparin infusion at 1100 units/hr Check anti-Xa level in 6 hours and daily while on heparin Continue to monitor H&H and platelets Preliminary review of pertinent patient information  completed.  Jeani HawkingAnnie [facility] clinical pharmacist will complete review during morning rounds to assess the patient and finalize treatment regimen.  Jaeshawn Silvio Scarlett, RPH 02/01/2016,3:50 AM

## 2016-02-02 ENCOUNTER — Encounter (HOSPITAL_COMMUNITY): Payer: Self-pay | Admitting: Cardiology

## 2016-02-02 ENCOUNTER — Encounter (HOSPITAL_COMMUNITY)
Admission: EM | Disposition: A | Discharge status: Home / Self Care | Payer: Self-pay | Source: Home / Self Care | Attending: Cardiology

## 2016-02-02 HISTORY — PX: CARDIAC CATHETERIZATION: SHX172

## 2016-02-02 LAB — POCT ACTIVATED CLOTTING TIME
Activated Clotting Time: 175 seconds
Activated Clotting Time: 197 seconds

## 2016-02-02 LAB — CBC
HCT: 37 % — ABNORMAL LOW (ref 39.0–52.0)
Hemoglobin: 13 g/dL (ref 13.0–17.0)
MCH: 32.7 pg (ref 26.0–34.0)
MCHC: 35.1 g/dL (ref 30.0–36.0)
MCV: 93 fL (ref 78.0–100.0)
Platelets: 224 10*3/uL (ref 150–400)
RBC: 3.98 MIL/uL — ABNORMAL LOW (ref 4.22–5.81)
RDW: 12.6 % (ref 11.5–15.5)
WBC: 18.5 10*3/uL — ABNORMAL HIGH (ref 4.0–10.5)

## 2016-02-02 LAB — HEMOGLOBIN A1C
Hgb A1c MFr Bld: 5.6 % (ref 4.8–5.6)
Mean Plasma Glucose: 114 mg/dL

## 2016-02-02 LAB — GLUCOSE, CAPILLARY
Glucose-Capillary: 124 mg/dL — ABNORMAL HIGH (ref 65–99)
Glucose-Capillary: 99 mg/dL (ref 65–99)
Glucose-Capillary: 116 mg/dL — ABNORMAL HIGH (ref 65–99)

## 2016-02-02 LAB — HEPARIN LEVEL (UNFRACTIONATED): Heparin Unfractionated: 0.48 IU/mL (ref 0.30–0.70)

## 2016-02-02 SURGERY — LEFT HEART CATH AND CORONARY ANGIOGRAPHY
Anesthesia: LOCAL

## 2016-02-02 MED ORDER — FENTANYL CITRATE (PF) 100 MCG/2ML IJ SOLN
INTRAMUSCULAR | Status: AC
  Filled 2016-02-02: qty 2

## 2016-02-02 MED ORDER — SODIUM CHLORIDE 0.9% FLUSH
3.0000 mL | Freq: Two times a day (BID) | INTRAVENOUS | Status: DC
  Administered 2016-02-02 – 2016-02-05 (×5): 3 mL via INTRAVENOUS

## 2016-02-02 MED ORDER — DEXTROSE 5 % IV SOLN
1.0000 g | INTRAVENOUS | Status: DC
  Administered 2016-02-02 – 2016-02-05 (×4): 1 g via INTRAVENOUS
  Filled 2016-02-02 (×4): qty 10

## 2016-02-02 MED ORDER — CLOPIDOGREL BISULFATE 75 MG PO TABS
75.0000 mg | ORAL_TABLET | Freq: Every day | ORAL | Status: DC
Start: 2016-02-03 — End: 2016-02-05
  Administered 2016-02-03 – 2016-02-05 (×3): 75 mg via ORAL
  Filled 2016-02-02 (×3): qty 1

## 2016-02-02 MED ORDER — SODIUM CHLORIDE 0.9% FLUSH: 3.0000 mL | INTRAVENOUS | Status: DC | PRN

## 2016-02-02 MED ORDER — IOPAMIDOL (ISOVUE-370) INJECTION 76%
INTRAVENOUS | Status: DC | PRN
  Administered 2016-02-02: 80 mL via INTRA_ARTERIAL

## 2016-02-02 MED ORDER — LIDOCAINE HCL (PF) 1 % IJ SOLN
INTRAMUSCULAR | Status: DC | PRN
  Administered 2016-02-02: 20 mL via INTRADERMAL

## 2016-02-02 MED ORDER — MIDAZOLAM HCL 2 MG/2ML IJ SOLN
INTRAMUSCULAR | Status: AC
  Filled 2016-02-02: qty 2

## 2016-02-02 MED ORDER — LIDOCAINE HCL (PF) 1 % IJ SOLN
INTRAMUSCULAR | Status: AC
  Filled 2016-02-02: qty 30

## 2016-02-02 MED ORDER — ALUM & MAG HYDROXIDE-SIMETH 200-200-20 MG/5ML PO SUSP
30.0000 mL | ORAL | Status: DC | PRN
  Administered 2016-02-02: 30 mL via ORAL
  Filled 2016-02-02: qty 30

## 2016-02-02 MED ORDER — HEPARIN SODIUM (PORCINE) 1000 UNIT/ML IJ SOLN
INTRAMUSCULAR | Status: AC
  Filled 2016-02-02: qty 1

## 2016-02-02 MED ORDER — FENTANYL CITRATE (PF) 100 MCG/2ML IJ SOLN
INTRAMUSCULAR | Status: DC | PRN
  Administered 2016-02-02: 25 ug via INTRAVENOUS

## 2016-02-02 MED ORDER — HEPARIN SODIUM (PORCINE) 1000 UNIT/ML IJ SOLN
INTRAMUSCULAR | Status: DC | PRN
  Administered 2016-02-02: 2000 [IU] via INTRAVENOUS

## 2016-02-02 MED ORDER — HEPARIN (PORCINE) IN NACL 2-0.9 UNIT/ML-% IJ SOLN
INTRAMUSCULAR | Status: AC
  Filled 2016-02-02: qty 1000

## 2016-02-02 MED ORDER — SODIUM CHLORIDE 0.9 % WEIGHT BASED INFUSION: 1.0000 mL/kg/h | INTRAVENOUS | Status: AC

## 2016-02-02 MED ORDER — MIDAZOLAM HCL 2 MG/2ML IJ SOLN
INTRAMUSCULAR | Status: DC | PRN
  Administered 2016-02-02: 1 mg via INTRAVENOUS

## 2016-02-02 MED ORDER — IOPAMIDOL (ISOVUE-370) INJECTION 76%
INTRAVENOUS | Status: AC
  Filled 2016-02-02: qty 100

## 2016-02-02 MED ORDER — HEPARIN (PORCINE) IN NACL 2-0.9 UNIT/ML-% IJ SOLN
INTRAMUSCULAR | Status: DC | PRN
  Administered 2016-02-02: 1000 mL via INTRA_ARTERIAL

## 2016-02-02 MED ORDER — SODIUM CHLORIDE 0.9 % IV SOLN: 250.0000 mL | INTRAVENOUS | Status: DC | PRN

## 2016-02-02 SURGICAL SUPPLY — 10 items
CATH INFINITI 5 FR STR PIGTAIL (CATHETERS) ×1 IMPLANT
CATH INFINITI 5FR JL5 (CATHETERS) ×1 IMPLANT
CATH INFINITI 5FR MULTPACK ANG (CATHETERS) ×1 IMPLANT
KIT HEART LEFT (KITS) ×2 IMPLANT
PACK CARDIAC CATHETERIZATION (CUSTOM PROCEDURE TRAY) ×2 IMPLANT
SHEATH PINNACLE 5F 10CM (SHEATH) ×1 IMPLANT
SYR MEDRAD MARK V 150ML (SYRINGE) ×2 IMPLANT
TRANSDUCER W/STOPCOCK (MISCELLANEOUS) ×2 IMPLANT
WIRE EMERALD 3MM-J .035X150CM (WIRE) ×1 IMPLANT
WIRE HI TORQ VERSACORE-J 145CM (WIRE) ×1 IMPLANT

## 2016-02-02 NOTE — Progress Notes (Signed)
Pt transported to cath lab with myself and RN from cath lab. VSS. Spouse in waiting room.

## 2016-02-02 NOTE — Progress Notes (Signed)
Patient currently on [facility]5LNC with sats of 98%. Patient is in no distress and all vitals are stable. BIPAP is not needed at this time. Will continue to monitor.

## 2016-02-02 NOTE — Interval H&P Note (Signed)
Cath Lab Visit (complete for each Cath Lab visit)  Clinical Evaluation Leading to the Procedure:   ACS: Yes.    Non-ACS:    Anginal Classification: CCS IV  Anti-ischemic medical therapy: Maximal Therapy (2 or more classes of medications)  Non-Invasive Test Results: No non-invasive testing performed  Prior CABG: No previous CABG      History and Physical Interval Note:  02/02/2016 9:00 AM  Lowell BoutonKenneth D Bisch  has presented today for surgery, with the diagnosis of unstable angina  The various methods of treatment have been discussed with the patient and family. After consideration of risks, benefits and other options for treatment, the patient has consented to  Procedure(s): Left Heart Cath and Coronary Angiography (N/A) as a surgical intervention .  The patient's history has been reviewed, patient examined, no change in status, stable for surgery.  I have reviewed the patient's chart and labs.  Questions were answered to the patient's satisfaction.     Rinaldo CloudHarwani, Deidrick Rainey

## 2016-02-02 NOTE — Progress Notes (Signed)
Site area: right groin Site Prior to Removal:  Level 0  Manual:   yes Patient Status During Pull:  yes Post Pull Site:  Level 0 Post Pull Instructions Given:  yes Post Pull Pulses Present: yes Dressing Applied:  yes Bedrest begins @ 1045 Comments:

## 2016-02-02 NOTE — Progress Notes (Signed)
ANTICOAGULATION CONSULT NOTE - Follow Up Consult  Pharmacy Consult for Heparin Indication: chest pain/ACS  No Known Allergies  Patient Measurements: Height: 5\' 9"  (175.3 cm) Weight: 192 lb 14.4 oz (87.5 kg) IBW/kg (Calculated) : 70.7  Vital Signs: Temp: 98.2 F (36.8 C) (12/26 0000) Temp Source: Oral (12/26 0000) BP: 122/85 (12/26 0000) Pulse Rate: 85 (12/26 0000)  Labs:  Recent Labs  01/31/16 2245 02/01/16 0214 02/01/16 1057 02/01/16 1530 02/01/16 1752 02/01/16 2136 02/02/16 0204  HGB 14.2  --   --   --   --   --  13.0  HCT 40.4  --   --   --   --   --  37.0*  PLT 208  --   --   --   --   --  224  APTT  --  42*  --   --   --   --   --   LABPROT  --  14.9  --   --   --   --   --   INR  --  1.17  --   --   --   --   --   HEPARINUNFRC  --   --  0.20*  --  0.10*  --  0.48  CREATININE 0.77  --  0.70  --   --   --   --   TROPONINI 0.35* 1.02* 1.49* 1.64*  --  1.61*  --     Estimated Creatinine Clearance: 103.5 mL/min (by C-G formula based on SCr of 0.7 mg/dL).   Assessment: 65 yo male hx COPD, AAA, HTN to ED with SOB and cough, [facility] ACS and up trending troponin. Pharmacy consulted to dose heparin gtt.  Heparin level therapeutic x 1 after rate increase.   Goal of Therapy:  Heparin level 0.3-0.7 units/ml Monitor platelets by anticoagulation protocol: Yes   Plan:  -Cont heparin at 1600 units/hr -1000 HL -Cath today  Abran DukeLedford, Derotha Fishbaugh 02/02/2016,2:54 AM

## 2016-02-03 ENCOUNTER — Inpatient Hospital Stay (HOSPITAL_COMMUNITY): Payer: BLUE CROSS/BLUE SHIELD

## 2016-02-03 LAB — BASIC METABOLIC PANEL
Anion gap: 7 (ref 5–15)
BUN: 10 mg/dL (ref 6–20)
CO2: 30 mmol/L (ref 22–32)
Calcium: 8.5 mg/dL — ABNORMAL LOW (ref 8.9–10.3)
Chloride: 100 mmol/L — ABNORMAL LOW (ref 101–111)
Creatinine, Ser: 0.53 mg/dL — ABNORMAL LOW (ref 0.61–1.24)
GFR calc Af Amer: >60 mL/min (ref >60)
GFR calc non Af Amer: >60 mL/min (ref >60)
Glucose, Bld: 131 mg/dL — ABNORMAL HIGH (ref 65–99)
Potassium: 3.8 mmol/L (ref 3.5–5.1)
Sodium: 137 mmol/L (ref 135–145)

## 2016-02-03 LAB — ECHOCARDIOGRAM COMPLETE
Height: 69 in
Weight: 3086.44 oz

## 2016-02-03 LAB — GLUCOSE, CAPILLARY
Glucose-Capillary: 96 mg/dL (ref 65–99)
Glucose-Capillary: 93 mg/dL (ref 65–99)
Glucose-Capillary: 85 mg/dL (ref 65–99)
Glucose-Capillary: 99 mg/dL (ref 65–99)

## 2016-02-03 LAB — CBC
HCT: 37.2 % — ABNORMAL LOW (ref 39.0–52.0)
Hemoglobin: 12.6 g/dL — ABNORMAL LOW (ref 13.0–17.0)
MCH: 31.9 pg (ref 26.0–34.0)
MCHC: 33.9 g/dL (ref 30.0–36.0)
MCV: 94.2 fL (ref 78.0–100.0)
Platelets: 240 10*3/uL (ref 150–400)
RBC: 3.95 MIL/uL — ABNORMAL LOW (ref 4.22–5.81)
RDW: 12.8 % (ref 11.5–15.5)
WBC: 10.9 10*3/uL — ABNORMAL HIGH (ref 4.0–10.5)

## 2016-02-03 MED ORDER — LEVALBUTEROL HCL 1.25 MG/0.5ML IN NEBU
1.2500 mg | INHALATION_SOLUTION | Freq: Three times a day (TID) | RESPIRATORY_TRACT | Status: DC
  Administered 2016-02-03 – 2016-02-05 (×6): 1.25 mg via RESPIRATORY_TRACT
  Filled 2016-02-03 (×7): qty 0.5

## 2016-02-03 MED ORDER — LEVALBUTEROL HCL 0.63 MG/3ML IN NEBU
0.6300 mg | INHALATION_SOLUTION | Freq: Four times a day (QID) | RESPIRATORY_TRACT | Status: DC | PRN
Start: 2016-02-03 — End: 2016-02-05

## 2016-02-03 MED FILL — Lidocaine HCl Local Preservative Free (PF) Inj 1%: INTRAMUSCULAR | Qty: 30 | Status: AC

## 2016-02-03 NOTE — Progress Notes (Signed)
Subjective:  Patient denies any chest pain states breathing has improved but continues to have coughing with whitish yellow mucus. WBCs trending down  Objective:  Vital Signs in the last 24 hours: Temp:  [97.7 F (36.5 C)-99.1 F (37.3 C)] 98.3 F (36.8 C) (12/27 1242) Pulse Rate:  [74-95] 74 (12/27 1200) Resp:  [18-27] 18 (12/27 1200) BP: (112-135)/(75-95) 125/75 (12/27 1242) SpO2:  [92 %-100 %] 97 % (12/27 1332)  Intake/Output from previous day: 12/26 0701 - 12/27 0700 In: 1799.5 [P.O.:360; I.V.:1139.5; IV Piggyback:300] Out: 1900 [Urine:1900] Intake/Output from this shift: Total I/O In: 1120 [P.O.:720; I.V.:350; IV Piggyback:50] Out: 850 [Urine:850]  Physical Exam: Neck: no adenopathy, no carotid bruit, no JVD and supple, symmetrical, trachea midline Lungs: Decreased breath sound at bases with occasional rhonchi noted Heart: regularly irregular rhythm, S1, S2 normal and 2/6 systolic murmur noted Abdomen: soft, non-tender; bowel sounds normal; no masses,  no organomegaly Extremities: extremities normal, atraumatic, no cyanosis or edema and Right groin stable  Lab Results:  Recent Labs  02/02/16 0204 02/03/16 0209  WBC 18.5* 10.9*  HGB 13.0 12.6*  PLT 224 240    Recent Labs  02/01/16 1057 02/03/16 0209  NA 133* 137  K 4.6 3.8  CL 94* 100*  CO2 26 30  GLUCOSE 165* 131*  BUN 11 10  CREATININE 0.70 0.53*    Recent Labs  02/01/16 1530 02/01/16 2136  TROPONINI 1.64* 1.61*   Hepatic Function Panel  Recent Labs  02/01/16 1057  PROT 6.6  ALBUMIN 2.7*  AST 58*  ALT 31  ALKPHOS 74  BILITOT 0.9   No results for input(s): CHOL in the last 72 hours. No results for input(s): PROTIME in the last 72 hours.  Imaging: Imaging results have been reviewed and No results found.  Cardiac Studies:  Assessment/Plan:  Status post Acute non-Q-wave myocardial infarction Severe aortic stenosis Resolving Exacerbation of COPD rule out  pneumonia Hypertension Hyperlipidemia Prediabetic Abdominal aortic aneurysm Remote tobacco abuse History of fatty liver History of EtOH abuse Plan Continue present management Check labs and chest x-ray in a.m.   LOS: 2 days    Rinaldo CloudHarwani, Nakyla Bracco 02/03/2016, 4:28 PM

## 2016-02-03 NOTE — Progress Notes (Signed)
  Echocardiogram 2D Echocardiogram has been performed.  [name] 02/03/2016, 3:35 PM 

## 2016-02-04 ENCOUNTER — Inpatient Hospital Stay (HOSPITAL_COMMUNITY): Payer: BLUE CROSS/BLUE SHIELD

## 2016-02-04 LAB — BASIC METABOLIC PANEL
Anion gap: 8 (ref 5–15)
BUN: 8 mg/dL (ref 6–20)
CO2: 28 mmol/L (ref 22–32)
Calcium: 8.9 mg/dL (ref 8.9–10.3)
Chloride: 98 mmol/L — ABNORMAL LOW (ref 101–111)
Creatinine, Ser: 0.5 mg/dL — ABNORMAL LOW (ref 0.61–1.24)
GFR calc Af Amer: >60 mL/min (ref >60)
GFR calc non Af Amer: >60 mL/min (ref >60)
Glucose, Bld: 114 mg/dL — ABNORMAL HIGH (ref 65–99)
Potassium: 4.1 mmol/L (ref 3.5–5.1)
Sodium: 134 mmol/L — ABNORMAL LOW (ref 135–145)

## 2016-02-04 LAB — CBC
HCT: 40.8 % (ref 39.0–52.0)
Hemoglobin: 13.9 g/dL (ref 13.0–17.0)
MCH: 31.9 pg (ref 26.0–34.0)
MCHC: 34.1 g/dL (ref 30.0–36.0)
MCV: 93.6 fL (ref 78.0–100.0)
Platelets: 271 10*3/uL (ref 150–400)
RBC: 4.36 MIL/uL (ref 4.22–5.81)
RDW: 12.7 % (ref 11.5–15.5)
WBC: 10.8 10*3/uL — ABNORMAL HIGH (ref 4.0–10.5)

## 2016-02-04 LAB — GLUCOSE, CAPILLARY
Glucose-Capillary: 92 mg/dL (ref 65–99)
Glucose-Capillary: 98 mg/dL (ref 65–99)
Glucose-Capillary: 104 mg/dL — ABNORMAL HIGH (ref 65–99)

## 2016-02-04 LAB — TROPONIN I: Troponin I: 0.8 ng/mL (ref <0.03)

## 2016-02-04 MED ORDER — DOCUSATE SODIUM 100 MG PO CAPS
100.0000 mg | ORAL_CAPSULE | Freq: Two times a day (BID) | ORAL | Status: DC | PRN
  Administered 2016-02-04: 100 mg via ORAL
  Filled 2016-02-04: qty 1

## 2016-02-04 MED ORDER — HEPARIN SODIUM (PORCINE) 5000 UNIT/ML IJ SOLN
5000.0000 [IU] | Freq: Three times a day (TID) | INTRAMUSCULAR | Status: DC
  Administered 2016-02-04 – 2016-02-05 (×3): 5000 [IU] via SUBCUTANEOUS
  Filled 2016-02-04 (×3): qty 1

## 2016-02-04 NOTE — Progress Notes (Signed)
Subjective:  States coughing and breathing is slowly improving denies any chest pain. Complains of constipation  Objective:  Vital Signs in the last 24 hours: Temp:  [97.8 F (36.6 C)-98.8 F (37.1 C)] 97.8 F (36.6 C) (12/28 0746) Pulse Rate:  [69-87] 79 (12/28 0800) Resp:  [12-24] 24 (12/28 0800) BP: (119-144)/(75-92) 137/91 (12/28 0336) SpO2:  [93 %-100 %] 96 % (12/28 0800) Weight:  [197 lb 5 oz (89.5 kg)] 197 lb 5 oz (89.5 kg) (12/28 0336)  Intake/Output from previous day: 12/27 0701 - 12/28 0700 In: 1610 [P.O.:960; I.V.:350; IV Piggyback:300] Out: 3675 [Urine:3675] Intake/Output from this shift: Total I/O In: -  Out: 800 [Urine:800]  Physical Exam: Neck: no adenopathy, no carotid bruit, no JVD and supple, symmetrical, trachea midline Lungs: Decreased breath sound at bases with occasional rhonchi Heart: regular rate and rhythm, S1, S2 normal and 2/6 systolic murmur noted Abdomen: soft, non-tender; bowel sounds normal; no masses,  no organomegaly Extremities: extremities normal, atraumatic, no cyanosis or edema  Lab Results:  Recent Labs  02/03/16 0209 02/04/16 0144  WBC 10.9* 10.8*  HGB 12.6* 13.9  PLT 240 271    Recent Labs  02/03/16 0209 02/04/16 0144  NA 137 134*  K 3.8 4.1  CL 100* 98*  CO2 30 28  GLUCOSE 131* 114*  BUN 10 8  CREATININE 0.53* 0.50*    Recent Labs  02/01/16 2136 02/04/16 0144  TROPONINI 1.61* 0.80*   Hepatic Function Panel No results for input(s): PROT, ALBUMIN, AST, ALT, ALKPHOS, BILITOT, BILIDIR, IBILI in the last 72 hours. No results for input(s): CHOL in the last 72 hours. No results for input(s): PROTIME in the last 72 hours.  Imaging: Imaging results have been reviewed and Dg Chest 2 View  Result Date: 02/04/2016 CLINICAL DATA:  Productive cough and shortness of breath. Chest tightness for 1 week. EXAM: CHEST  2 VIEW COMPARISON:  One-view chest x-ray a 01/31/2016 FINDINGS: The heart size is normal. Bilateral pleural  effusions are again noted, left greater than right. Bibasilar airspace disease is similar to the prior study, worse on the left. The upper lung fields are clear. Emphysematous changes are noted. Mild degenerative changes are noted in the thoracic spine. IMPRESSION: 1. Small left greater than right pleural effusions and bibasilar airspace disease. This is worrisome for pneumonia, particularly on the left. 2. Emphysema. Electronically Signed   By: Marin Robertshristopher  Mattern M.D.   On: 02/04/2016 07:58    Cardiac Studies:  Assessment/Plan:  Status post Acute non-Q-wave myocardial infarction Severe aortic stenosis Resolving bilateral pneumonia Hypertension Hyperlipidemia Prediabetic Abdominal aortic aneurysm Remote tobacco abuse History of fatty liver History of EtOH abuse Plan Continue present management Discussed with patient and family regarding severe aortic stenosis and possible TAVR  in future Transfer to telemetry  LOS: 3 days    [name], [name] 02/04/2016, 11:12 AM

## 2016-02-04 NOTE — Progress Notes (Signed)
Neb treatment given by RN

## 2016-02-05 LAB — CBC
HCT: 40.8 % (ref 39.0–52.0)
Hemoglobin: 13.9 g/dL (ref 13.0–17.0)
MCH: 31.8 pg (ref 26.0–34.0)
MCHC: 34.1 g/dL (ref 30.0–36.0)
MCV: 93.4 fL (ref 78.0–100.0)
Platelets: 310 10*3/uL (ref 150–400)
RBC: 4.37 MIL/uL (ref 4.22–5.81)
RDW: 12.6 % (ref 11.5–15.5)
WBC: 10.4 10*3/uL (ref 4.0–10.5)

## 2016-02-05 LAB — GLUCOSE, CAPILLARY
Glucose-Capillary: 97 mg/dL (ref 65–99)
Glucose-Capillary: 98 mg/dL (ref 65–99)

## 2016-02-05 MED ORDER — CLOPIDOGREL BISULFATE 75 MG PO TABS: 75.0000 mg | ORAL_TABLET | Freq: Every day | ORAL | 3 refills | Status: DC

## 2016-02-05 MED ORDER — METOPROLOL TARTRATE 25 MG PO TABS: 12.5000 mg | ORAL_TABLET | Freq: Two times a day (BID) | ORAL | 3 refills | Status: DC

## 2016-02-05 MED ORDER — LEVOFLOXACIN 750 MG PO TABS: 750.0000 mg | ORAL_TABLET | Freq: Every day | ORAL | 0 refills | Status: DC

## 2016-02-05 MED ORDER — NITROGLYCERIN 0.4 MG SL SUBL: 0.4000 mg | SUBLINGUAL_TABLET | SUBLINGUAL | 12 refills | Status: DC | PRN

## 2016-02-05 NOTE — Care Management Note (Signed)
Case Management Note  Patient Details  Name: [name] MRN: [id number] Date of Birth: [birth year]  Subjective/Objective:   Patient will need to go home with home oxygen , [facility] , [facility]                   Action/Plan:   Expected Discharge Date:                  Expected Discharge Plan:  Home/Self Care  In-House Referral:     Discharge planning Services  CM Consult  Post Acute Care Choice:  Durable Medical Equipment Choice offered to:  Patient  DME Arranged:  Oxygen DME Agency:  [facility]  HH Arranged:    HH Agency:     Status of Service:  Completed, signed off  If discussed at Long Length of Stay Meetings, dates discussed:    Additional Comments:  Leone Havenaylor, Ival Pacer Clinton, RN 02/05/2016, 2:53 PM

## 2016-02-05 NOTE — Progress Notes (Signed)
CARDIAC REHAB PHASE I   PRE:  Rate/Rhythm: 100 ST  BP:  Supine:   Sitting: 126/85  Standing:    SaO2: 96% 3L  MODE:  Ambulation: 500 ft   POST:  Rate/Rhythm: 102 ST  BP:  Supine:   Sitting: 114/77  Standing:    SaO2: 85%RA   Above 90% on 2l  SATURATION QUALIFICATIONS: (This note is used to comply with regulatory documentation for home oxygen)  Patient Saturations on Room Air at Rest = 92%  Patient Saturations on Room Air while Ambulating = 85%  Patient Saturations on 2 Liters of oxygen while Ambulating = 94%  Please briefly explain why patient needs home oxygen:  desat on RA with activity  1000-1105 Pt walked 500 ft with fairly steady gait independently. Tried pt on RA but he desat as written above. Sats good on 2L. Left on 2L in room. MI education completed with pt and wife who voiced understanding. They have been following heart healthy diet. Reviewed how to use NTG in case pt goes home on it but he stated his BP dropped with it here at hospital.. Instructed to call 911 with CP. Will refer to [facility] since pt ruled in for MI but added text that pt to have surgery/TAVR in few months. Pt does not want to be contacted until after that time. Gave walking instructions for ex but encouraged pt to listen to his body and to give adequate time to recuperate due to pneumonia. Qualified pt for home oxygen if going home soon. We can check sats again tomorrow with walk to see if any improvement.     [name], RN BSN  02/05/2016 10:59 AM

## 2016-02-05 NOTE — Discharge Summary (Signed)
Discharge summary dictated on 02/05/2016 dictation number is [id number]

## 2016-02-05 NOTE — Discharge Instructions (Signed)
Acute Coronary Syndrome °Acute coronary syndrome (ACS) is a serious problem in which there is suddenly not enough blood and oxygen supplied to the heart. ACS may mean that one or more of the blood vessels in your heart (coronary arteries) may be blocked. ACS can result in chest pain or a heart attack (myocardial infarction or MI). °What are the causes? °This condition is caused by atherosclerosis, which is the buildup of fat and cholesterol (plaque) on the inside of the arteries. Over time, the plaque may narrow or block the artery, and this will lessen blood flow to the heart. Plaque can also become weak and break off within a coronary artery to form a clot and cause a sudden blockage. °What increases the risk? °The risk factors of this condition include: °· High cholesterol levels. °· High blood pressure (hypertension). °· Smoking. °· Diabetes. °· Age. °· Family history of chest pain, heart disease, or stroke. °· Lack of exercise. °What are the signs or symptoms? °The most common signs of this condition include: °· Chest pain, which can be: °¨ A crushing or squeezing in the chest. °¨ A tightness, pressure, fullness, or heaviness in the chest. °¨ Present for more than a few minutes, or it can stop and recur. °· Pain in the arms, neck, jaw, or back. °· Unexplained heartburn or indigestion. °· Shortness of breath. °· Nausea. °· Sudden cold sweats. °· Feeling light-headed or dizzy. °Sometimes, this condition has no symptoms. °How is this diagnosed? °ACS may be diagnosed through the following tests: °· Electrocardiogram (ECG). °· Blood tests. °· Coronary angiogram. This is a procedure to look at the coronary arteries to see if there is any blockage. °How is this treated? °Treatment for ACS may include: °· Healthy behavioral changes to reduce or control risk factors. °· Medicine. °· Coronary stenting. A stent helps to keep an artery open. °· Coronary angioplasty. This procedure widens a narrowed or blocked  artery. °· Coronary artery bypass surgery. This will allow your blood to pass the blockage (bypass) to reach your heart. °Follow these instructions at home: °Eating and drinking °· Follow a heart-healthy diet. A dietitian can you help to educate you about healthy food options and changes. °· Use healthy cooking methods such as roasting, grilling, broiling, baking, poaching, steaming, or stir-frying. Talk to a dietitian to learn more about healthy cooking methods. °Medicines °· Take medicines only as directed by your health care provider. °· Do not take the following medicines unless your health care provider approves: °¨ Nonsteroidal anti-inflammatory drugs (NSAIDs), such as ibuprofen, naproxen, or celecoxib. °¨ Vitamin supplements that contain vitamin A, vitamin E, or both. °¨ Hormone replacement therapy that contains estrogen with or without progestin. °· Stop illegal drug use. °Activity °· Follow an exercise program that is approved by your health care provider. °· Plan rest periods when you are fatigued. °Lifestyle °· Do not use any tobacco products, including cigarettes, chewing tobacco, or electronic cigarettes. If you need help quitting, ask your health care provider. °· If you drink alcohol, and your health care provider approves, limit your alcohol intake to no more than 1 drink per day. One drink equals 12 ounces of beer, 5 ounces of wine, or 1½ ounces of hard liquor. °· Learn to manage stress. °· Maintain a healthy weight. Lose weight as approved by your health care provider. °General instructions °· Manage other health conditions, such as hypertension and diabetes, as directed by your health care provider. °· Keep all follow-up visits as directed by your   health care provider. This is important.  Your health care provider may ask you to monitor your blood pressure. A blood pressure reading consists of a higher number over a lower number, such as 110 over 72, written as 110/72. Ideally, your blood  pressure should be:  Below 140/90 if you have no other medical conditions.  Below 130/80 if you have diabetes or kidney disease. Get help right away if:  You have pain in your chest, neck, arm, jaw, stomach, or back that lasts more than a few minutes, is recurring, or is not relieved by taking medicine under your tongue (sublingual nitroglycerin).  You have profuse sweating without cause.  You have unexplained:  Heartburn or indigestion.  Shortness of breath or difficulty breathing.  Nausea or vomiting.  Fatigue.  Feelings of nervousness or anxiety.  Weakness.  Diarrhea.  You have sudden light-headedness or dizziness.  You faint. These symptoms may represent a serious problem that is an emergency. Do not wait to see if the symptoms will go away. Get medical help right away. Call your local emergency services (911 in the [place] Do not drive yourself to the clinic or hospital.  This information is not intended to replace advice given to you by your health care provider. Make sure you discuss any questions you have with your health care provider. Document Released: 01/24/2005 Document Revised: 07/08/2015 Document Reviewed: 05/28/2013 Elsevier Interactive Patient Education  2017 [employer] Inc[employer] Coronary Angiogram A coronary angiogram is an X-ray procedure that is used to examine the arteries in the heart. In this procedure, a dye (contrast dye) is injected through a long, thin tube (catheter). The catheter is inserted through the groin, wrist, or arm. The dye is injected into each artery, then X-rays are taken to show if there is a blockage in the arteries of the heart. This procedure can also show if you have valve disease or a disease of the aorta, and it can be used to check the overall function of your heart muscle. You may have a coronary angiogram if:  You are having chest pain, or other symptoms of angina, and you are at risk for heart disease.  You have an abnormal  electrocardiogram (ECG) or stress test.  You have chest pain and heart failure.  You are having irregular heart rhythms.  You and your health care provider determine that the benefits of the test information outweigh the risks of the procedure. Let your health care provider know about:  Any allergies you have, including allergies to contrast dye.  All medicines you are taking, including vitamins, herbs, eye drops, creams, and over-the-counter medicines.  Any problems you or family members have had with anesthetic medicines.  Any blood disorders you have.  Any surgeries you have had.  History of kidney problems or kidney failure.  Any medical conditions you have.  Whether you are pregnant or may be pregnant. What are the risks? Generally, this is a safe procedure. However, problems may occur, including:  Infection.  Allergic reaction to medicines or dyes that are used.  Bleeding from the access site or other locations.  Kidney injury, especially in people with impaired kidney function.  Stroke (rare).  Heart attack (rare).  Damage to other structures or organs. What happens before the procedure? Staying hydrated  Follow instructions from your health care provider about hydration, which may include:  Up to 2 hours before the procedure - you may continue to drink clear liquids, such as water, clear fruit juice, black coffee,  and plain tea. Eating and drinking restrictions  Follow instructions from your health care provider about eating and drinking, which may include:  8 hours before the procedure - stop eating heavy meals or foods such as meat, fried foods, or fatty foods.  6 hours before the procedure - stop eating light meals or foods, such as toast or cereal.  2 hours before the procedure - stop drinking clear liquids. General instructions  Ask your health care provider about:  Changing or stopping your regular medicines. This is especially important if you  are taking diabetes medicines or blood thinners.  Taking medicines such as ibuprofen. These medicines can thin your blood. Do not take these medicines before your procedure if your health care provider instructs you not to, though aspirin may be recommended prior to coronary angiograms.  Plan to have someone take you home from the hospital or clinic.  You may need to have blood tests or X-rays done. What happens during the procedure?  An IV tube will be inserted into one of your veins.  You will be given one or more of the following:  A medicine to help you relax (sedative).  A medicine to numb the area where the catheter will be inserted into an artery (local anesthetic).  To reduce your risk of infection:  Your health care team will wash or sanitize their hands.  Your skin will be washed with soap.  Hair may be removed from the area where the catheter will be inserted.  You will be connected to a continuous ECG monitor.  The catheter will be inserted into an artery. The location may be in your groin, in your wrist, or in the fold of your arm (near your elbow).  A type of X-ray (fluoroscopy) will be used to help guide the catheter to the opening of the blood vessel that is being examined.  A dye will be injected into the catheter, and X-rays will be taken. The dye will help to show where any narrowing or blockages are located in the heart arteries.  Tell your health care provider if you have any chest pain or trouble breathing during the procedure.  If blockages are found, your health care provider may perform another procedure, such as inserting a coronary stent. The procedure may vary among health care providers and hospitals. What happens after the procedure?  After the procedure, you will need to keep the area still for a few hours, or for as long as told by your health care provider. If the procedure is done through the groin, you will be instructed to not bend and not  cross your legs.  The insertion site will be checked frequently.  The pulse in your foot or wrist will be checked frequently.  You may have additional blood tests, X-rays, and a test that records the electrical activity of your heart (ECG).  Do not drive for 24 hours if you were given a sedative. Summary  A coronary angiogram is an X-ray procedure that is used to look into the arteries in the heart.  During the procedure, a dye (contrast dye) is injected through a long, thin tube (catheter). The catheter is inserted through the groin, wrist, or arm.  Tell your health care provider about any allergies you have, including allergies to contrast dye.  After the procedure, you will need to keep the area still for a few hours, or for as long as told by your health care provider. This information is not intended  to replace advice given to you by your health care provider. Make sure you discuss any questions you have with your health care provider. Document Released: 07/31/2002 Document Revised: 11/06/2015 Document Reviewed: 11/06/2015 Elsevier Interactive Patient Education  2017 Elsevier Inc.  Aspiration Pneumonia Aspiration pneumonia is an infection in your lungs. It occurs when food, liquid, or stomach contents (vomit) are inhaled (aspirated) into your lungs. When these things get into your lungs, swelling (inflammation) and infection can occur. This can make it difficult for you to breathe. Aspiration pneumonia is a serious condition and can be life threatening. What increases the risk? Aspiration pneumonia is more likely to occur when a person's cough (gag) reflex or ability to swallow has been decreased. Some things that can do this include:  Having a brain injury or disease, such as stroke, seizures, Parkinson's disease, dementia, or amyotrophic lateral sclerosis (ALS).  Being given general anesthetic for procedures.  Being in a coma (unconscious).  Having a narrowing of the tube  that carries food to the stomach (esophagus).  Drinking too much alcohol. If a person passes out and vomits, vomit can be swallowed into the lungs.  Taking certain medicines, such as tranquilizers or sedatives. What are the signs or symptoms?  Coughing after swallowing food or liquids.  Breathing problems, such as wheezing or shortness of breath.  Bluish skin. This can be caused by lack of oxygen.  Coughing up food or mucus. The mucus might contain blood, greenish material, or yellowish-white fluid (pus).  Fever.  Chest pain.  Being more tired than usual (fatigue).  Sweating more than usual.  Bad breath. How is this diagnosed? A physical exam will be done. During the exam, the health care provider will listen to your lungs with a stethoscope to check for:  Crackling sounds in the lungs.  Decreased breath sounds.  A rapid heartbeat. Various tests may be ordered. These may include:  Chest X-ray.  CT scan.  Swallowing study. This test looks at how food is swallowed and whether it goes into your breathing tube (trachea) or food pipe (esophagus).  Sputum culture. Saliva and mucus (sputum) are collected from the lungs or the tubes that carry air to the lungs (bronchi). The sputum is then tested for bacteria.  Bronchoscopy. This test uses a flexible tube (bronchoscope) to see inside the lungs. How is this treated? Treatment will usually include antibiotic medicines. Other medicines may also be used to reduce fever or pain. You may need to be treated in the hospital. In the hospital, your breathing will be carefully monitored. Depending on how well you are breathing, you may need to be given oxygen, or you may need breathing support from a breathing machine (ventilator). For people who fail a swallowing study, a feeding tube might be placed in the stomach, or they may be asked to avoid certain food textures or liquids when they eat. Follow these instructions at home:  Carefully  follow any special eating instructions you were given, such as avoiding certain food textures or thickening liquids. This reduces the risk of developing aspiration pneumonia again.  Only take over-the-counter or prescription medicines as directed by your health care provider. Follow the directions carefully.  If you were prescribed antibiotics, take them as directed. Finish them even if you start to feel better.  Rest as instructed by your health care provider.  Keep all follow-up appointments with your health care provider. Contact a health care provider if:  You develop worsening shortness of breath, wheezing, or  difficulty breathing.  You develop a fever.  You have chest pain. This information is not intended to replace advice given to you by your health care provider. Make sure you discuss any questions you have with your health care provider. Document Released: 11/21/2008 Document Revised: 07/08/2015 Document Reviewed: 07/12/2012 Elsevier Interactive Patient Education  2017 [employer]

## 2016-02-05 NOTE — Progress Notes (Signed)
SATURATION QUALIFICATIONS: (This note is used to comply with regulatory documentation for home oxygen)  Patient Saturations on Room Air at Rest = 93%  Patient Saturations on Room Air while Ambulating = 83%  Patient Saturations on 2 Liters of oxygen while Ambulating = 95%  Please briefly explain why patient needs home oxygen: due to desats with ambulation without oxygen

## 2016-02-05 NOTE — Progress Notes (Signed)
Pt's oxygen tank got delivered to room. Pt's assessment unchanged from this am. D/c'd to private vehcile with wife.

## 2016-02-06 NOTE — Discharge Summary (Signed)
NAMMarland Kitchen:  Doylene CanningROBERTSON, Daniel           ACCOUNT NO.:  [id number]  MEDICAL RECORD NO.:  [id number]  LOCATION:  [id number]                        FACILITY:  [facility]  PHYSICIAN:  Eduardo OsierMohan N. Sharyn LullHarwani, M.D. DATE OF BIRTH:  05/16/1950  DATE OF ADMISSION:  02/01/2016 DATE OF DISCHARGE:  02/05/2016                              DISCHARGE SUMMARY   ADMITTING DIAGNOSES: 1. Acute non-Q-wave myocardial infarction.  Exacerbation of chronic     obstructive pulmonary disease, rule out pneumonia. 2. Hypertension. 3. Hyperlipidemia. 4. Prediabetic. 5. Abdominal aortic aneurysm. 6. Remote tobacco abuse. 7. History of fatty liver. 8. History of EtOH abuse.  FINAL DIAGNOSES: 1. Status post small non-Q-wave myocardial infarction, status post     left cardiac catheterization. 2. Severe aortic stenosis. 3. Resolving bilateral pneumonia. 4. Status post exacerbation of chronic obstructive pulmonary disease. 5. Hypertension. 6. Hyperlipidemia. 7. Prediabetic. 8. Abdominal aortic aneurysm. 9. Remote tobacco abuse. 10.History of fatty liver. 11.History of EtOH abuse in the past.  DISCHARGE HOME MEDICATIONS: 1. Clopidogrel 75 mg 1 tablet daily. 2. Levofloxacin 750 mg 1 tablet daily for 7 days. 3. Lopressor 25 mg half tablet twice daily. 4. Nitrostat sublingual p.r.n. 5. Albuterol inhaler 2 puffs every 4 hours as needed as before. 6. Aspirin 81 mg 1 tablet daily. 7. Atorvastatin 40 mg daily. 8. Allegra 180 mg as needed. 9. Fish oil 1000 mg 2 capsules daily. 10.Advair Diskus 100/50 mcg per dose twice daily as before. 11.Nasonex 50 mcg per nasal spray daily as before. 12.Multivitamin 1 tablet daily as before. 13.Spiriva HandiHaler 18 mcg per inhalation, 1 dose daily. 14.Vitamin C 1 tablet daily. 15.Stop lisinopril-hydrochlorothiazide.  DIET:  Low salt, low cholesterol 1800 calories ADA diet.  Post cardiac catheterization instructions have been given.  CONDITION AT DISCHARGE:  Stable.  FOLLOWUP:  Follow  up with me in 1 week.  BRIEF HISTORY AND HOSPITAL COURSE:  [name] is a 65 year old, male with past medical history significant for hypertension, hyperlipidemia, COPD, remote tobacco abuse.  Smoked approximately 1-1/2 pack per day for 25 years.  Quit in 2004.  Abdominal aortic aneurysm, history of fatty liver, history of alcohol abuse.  [facility].  The patient complained of retrosternal chest pain, described as tightness, radiating occasionally to the left shoulder off and on associated with progressive increasing shortness of breath and chest congestion for last few days.  The patient was initially placed on BiPAP.  Received nebulizer treatment, IV Solu- Medrol, and antibiotics with improvement in his breathing and was noted to have elevated troponin I.  the patient denies any fever, but complains of chills and diaphoresis.  Denies any cardiac workup in the past.  States his aneurysm has been stable and is being followed by Vascular Surgery.  PHYSICAL EXAMINATION:  GENERAL:  He was alert, awake, oriented x3. VITAL SIGNS:  Blood pressure was 111/78, pulse 93, he was afebrile. HEENT:  Conjunctivae were pink. NECK:  Supple.  No JVD. LUNGS:  Decreased breath sound at bases. CARDIOVASCULAR:  S1, S2 normal.  There was soft systolic murmur. ABDOMEN:  Soft.  Bowel sounds present.  Nontender. EXTREMITIES:  There is no clubbing, cyanosis, or edema.  LABORATORY DATA:  Sodium was  127, potassium 3.6, BUN 14, creatinine 0.77.  His BNP was 161.  Troponin I first set was 0.35, repeat troponin I was 1.02.  Repeat troponins were 1.49, 1.64, 1.61, and last troponin was 0.80 which is trending down.  His hemoglobin was 14.2, hematocrit 40.4, white count of 19.4 with left shift.  His lactic acid was elevated at 3.05, repeat was 1.48 and 1.7.  Repeat hemoglobin was 13, hematocrit 37, white count of 18.5.  Today; hemoglobin is 13.9, hematocrit 40.8, white  count of 10.4 which is trending down.  Last electrolytes; sodium 134, potassium 4.1, BUN 8, creatinine 0.50.  Blood sugar is 114, fasting blood sugar today was 97.  Chest x-ray showed small pleural effusion, bibasilar airspace worrisome for pneumonia, particularly on the left and changes of emphysema.  2D echo showed good LV systolic function with possible apical hypokinesia, severe aortic stenosis with aortic valve area of 0.63 squared cm and mean gradient of 38 mmHg.  BRIEF HOSPITAL COURSE:  The patient was admitted to step-down unit.  The patient ruled in for small non-Q-wave myocardial infarction.  The patient subsequently underwent left cardiac catheterization as per procedure report.  The patient tolerated procedure well.  Post procedure, the patient did not have any episodes of chest pain.  His cardiac enzymes are trending down.  The patient was continued on broad- spectrum antibiotics.  The patient remained afebrile during the hospital stay.  His white count is trending down.  The patient is ambulating in room without any problems.  His groin is stable.  Discussed with the patient and family regarding 2D echo finding and possible evaluation for TAVR as outpatient to which he agrees.  The patient will be discharged home on above medications and will be followed up in my office in 1 week.     Eduardo OsierMohan N. Sharyn LullHarwani, M.D.     [facility]  D:  02/05/2016  T:  02/06/2016  Job:  [id number]

## 2016-02-08 DIAGNOSIS — I4891 Unspecified atrial fibrillation: Secondary | ICD-10-CM

## 2016-02-08 DIAGNOSIS — I9789 Other postprocedural complications and disorders of the circulatory system, not elsewhere classified: Secondary | ICD-10-CM

## 2016-02-08 HISTORY — DX: Unspecified atrial fibrillation: I97.89

## 2016-02-08 HISTORY — DX: Unspecified atrial fibrillation: I48.91

## 2016-02-19 ENCOUNTER — Telehealth: Payer: Self-pay | Admitting: Family Medicine

## 2016-02-19 NOTE — Telephone Encounter (Signed)
Calling to let Dr. Lorin PicketScott know that patient was admitted into [facility] on 01/12/16 and discharged on 02/05/16.  They are also trying to enroll in case management program to help with education and support.

## 2016-04-13 ENCOUNTER — Encounter (HOSPITAL_COMMUNITY): Payer: Self-pay

## 2016-04-13 ENCOUNTER — Inpatient Hospital Stay (HOSPITAL_COMMUNITY)
Admission: AD | Admit: 2016-04-13 | Discharge: 2016-04-14 | DRG: 896 | Disposition: A | Discharge status: Home / Self Care | Payer: BLUE CROSS/BLUE SHIELD | Source: Other Acute Inpatient Hospital | Attending: Pulmonary Disease | Admitting: Pulmonary Disease

## 2016-04-13 ENCOUNTER — Inpatient Hospital Stay (HOSPITAL_COMMUNITY): Payer: BLUE CROSS/BLUE SHIELD

## 2016-04-13 DIAGNOSIS — J9622 Acute and chronic respiratory failure with hypercapnia: Secondary | ICD-10-CM | POA: Diagnosis not present

## 2016-04-13 DIAGNOSIS — E785 Hyperlipidemia, unspecified: Secondary | ICD-10-CM | POA: Diagnosis present

## 2016-04-13 DIAGNOSIS — K760 Fatty (change of) liver, not elsewhere classified: Secondary | ICD-10-CM | POA: Diagnosis present

## 2016-04-13 DIAGNOSIS — Z7951 Long term (current) use of inhaled steroids: Secondary | ICD-10-CM | POA: Diagnosis not present

## 2016-04-13 DIAGNOSIS — I10 Essential (primary) hypertension: Secondary | ICD-10-CM | POA: Diagnosis present

## 2016-04-13 DIAGNOSIS — F10231 Alcohol dependence with withdrawal delirium: Secondary | ICD-10-CM | POA: Diagnosis present

## 2016-04-13 DIAGNOSIS — I714 Abdominal aortic aneurysm, without rupture: Secondary | ICD-10-CM | POA: Diagnosis not present

## 2016-04-13 DIAGNOSIS — J9611 Chronic respiratory failure with hypoxia: Secondary | ICD-10-CM | POA: Diagnosis not present

## 2016-04-13 DIAGNOSIS — Z87891 Personal history of nicotine dependence: Secondary | ICD-10-CM

## 2016-04-13 DIAGNOSIS — Z7982 Long term (current) use of aspirin: Secondary | ICD-10-CM | POA: Diagnosis not present

## 2016-04-13 DIAGNOSIS — R739 Hyperglycemia, unspecified: Secondary | ICD-10-CM | POA: Diagnosis present

## 2016-04-13 DIAGNOSIS — I350 Nonrheumatic aortic (valve) stenosis: Secondary | ICD-10-CM | POA: Diagnosis present

## 2016-04-13 DIAGNOSIS — E669 Obesity, unspecified: Secondary | ICD-10-CM | POA: Diagnosis not present

## 2016-04-13 DIAGNOSIS — Z6828 Body mass index (BMI) 28.0-28.9, adult: Secondary | ICD-10-CM

## 2016-04-13 DIAGNOSIS — J449 Chronic obstructive pulmonary disease, unspecified: Secondary | ICD-10-CM | POA: Diagnosis not present

## 2016-04-13 DIAGNOSIS — Z7902 Long term (current) use of antithrombotics/antiplatelets: Secondary | ICD-10-CM | POA: Diagnosis not present

## 2016-04-13 DIAGNOSIS — R569 Unspecified convulsions: Secondary | ICD-10-CM | POA: Diagnosis present

## 2016-04-13 DIAGNOSIS — E872 Acidosis: Secondary | ICD-10-CM | POA: Diagnosis present

## 2016-04-13 DIAGNOSIS — J9612 Chronic respiratory failure with hypercapnia: Secondary | ICD-10-CM | POA: Diagnosis not present

## 2016-04-13 DIAGNOSIS — G4089 Other seizures: Secondary | ICD-10-CM | POA: Diagnosis not present

## 2016-04-13 LAB — URINALYSIS, ROUTINE W REFLEX MICROSCOPIC
Bilirubin Urine: NEGATIVE
Glucose, UA: NEGATIVE mg/dL
Hgb urine dipstick: NEGATIVE
Ketones, ur: NEGATIVE mg/dL
Leukocytes, UA: NEGATIVE
Nitrite: NEGATIVE
Protein, ur: 100 mg/dL — AB
Specific Gravity, Urine: 1.014 (ref 1.005–1.030)
pH: 5 (ref 5.0–8.0)

## 2016-04-13 LAB — MRSA PCR SCREENING: MRSA by PCR: NEGATIVE

## 2016-04-13 LAB — HEPATIC FUNCTION PANEL
ALT: 25 U/L (ref 17–63)
AST: 50 U/L — ABNORMAL HIGH (ref 15–41)
Albumin: 3.5 g/dL (ref 3.5–5.0)
Alkaline Phosphatase: 40 U/L (ref 38–126)
Bilirubin, Direct: 0.2 mg/dL (ref 0.1–0.5)
Indirect Bilirubin: 0.4 mg/dL (ref 0.3–0.9)
Total Bilirubin: 0.6 mg/dL (ref 0.3–1.2)
Total Protein: 6.1 g/dL — ABNORMAL LOW (ref 6.5–8.1)

## 2016-04-13 LAB — GLUCOSE, CAPILLARY
Glucose-Capillary: 109 mg/dL — ABNORMAL HIGH (ref 65–99)
Glucose-Capillary: 123 mg/dL — ABNORMAL HIGH (ref 65–99)
Glucose-Capillary: 111 mg/dL — ABNORMAL HIGH (ref 65–99)
Glucose-Capillary: 106 mg/dL — ABNORMAL HIGH (ref 65–99)
Glucose-Capillary: 108 mg/dL — ABNORMAL HIGH (ref 65–99)

## 2016-04-13 LAB — BASIC METABOLIC PANEL
Anion gap: 11 (ref 5–15)
BUN: 13 mg/dL (ref 6–20)
CO2: 26 mmol/L (ref 22–32)
Calcium: 8.9 mg/dL (ref 8.9–10.3)
Chloride: 104 mmol/L (ref 101–111)
Creatinine, Ser: 0.84 mg/dL (ref 0.61–1.24)
GFR calc Af Amer: >60 mL/min (ref >60)
GFR calc non Af Amer: >60 mL/min (ref >60)
Glucose, Bld: 104 mg/dL — ABNORMAL HIGH (ref 65–99)
Potassium: 3.8 mmol/L (ref 3.5–5.1)
Sodium: 141 mmol/L (ref 135–145)

## 2016-04-13 LAB — PHOSPHORUS: Phosphorus: 3.5 mg/dL (ref 2.5–4.6)

## 2016-04-13 LAB — HIV ANTIBODY (ROUTINE TESTING W REFLEX): HIV Screen 4th Generation wRfx: NONREACTIVE

## 2016-04-13 LAB — CBC
HCT: 45.3 % (ref 39.0–52.0)
Hemoglobin: 15.3 g/dL (ref 13.0–17.0)
MCH: 31.1 pg (ref 26.0–34.0)
MCHC: 33.8 g/dL (ref 30.0–36.0)
MCV: 92.1 fL (ref 78.0–100.0)
Platelets: 164 10*3/uL (ref 150–400)
RBC: 4.92 MIL/uL (ref 4.22–5.81)
RDW: 13.2 % (ref 11.5–15.5)
WBC: 11.1 10*3/uL — ABNORMAL HIGH (ref 4.0–10.5)

## 2016-04-13 LAB — LACTIC ACID, PLASMA
Lactic Acid, Venous: 1.7 mmol/L (ref 0.5–1.9)
Lactic Acid, Venous: 0.9 mmol/L (ref 0.5–1.9)

## 2016-04-13 LAB — PROCALCITONIN: Procalcitonin: 0.1 ng/mL

## 2016-04-13 LAB — MAGNESIUM: Magnesium: 2 mg/dL (ref 1.7–2.4)

## 2016-04-13 MED ORDER — INSULIN ASPART 100 UNIT/ML ~~LOC~~ SOLN: 0.0000 [IU] | Freq: Three times a day (TID) | SUBCUTANEOUS | Status: DC

## 2016-04-13 MED ORDER — CLOPIDOGREL BISULFATE 75 MG PO TABS
75.0000 mg | ORAL_TABLET | Freq: Every day | ORAL | Status: DC
  Administered 2016-04-13 – 2016-04-14 (×2): 75 mg via ORAL
  Filled 2016-04-13 (×3): qty 1

## 2016-04-13 MED ORDER — ASPIRIN 81 MG PO CHEW
81.0000 mg | CHEWABLE_TABLET | Freq: Every day | ORAL | Status: DC
  Administered 2016-04-13 – 2016-04-14 (×2): 81 mg via ORAL
  Filled 2016-04-13 (×2): qty 1

## 2016-04-13 MED ORDER — ACETAMINOPHEN 325 MG PO TABS
650.0000 mg | ORAL_TABLET | Freq: Once | ORAL | Status: AC
  Administered 2016-04-13: 650 mg via ORAL
  Filled 2016-04-13: qty 2

## 2016-04-13 MED ORDER — METOPROLOL TARTRATE 12.5 MG HALF TABLET
12.5000 mg | ORAL_TABLET | Freq: Two times a day (BID) | ORAL | Status: DC
  Administered 2016-04-13 – 2016-04-14 (×2): 12.5 mg via ORAL
  Filled 2016-04-13 (×4): qty 1

## 2016-04-13 MED ORDER — INSULIN ASPART 100 UNIT/ML ~~LOC~~ SOLN: 2.0000 [IU] | SUBCUTANEOUS | Status: DC

## 2016-04-13 MED ORDER — INSULIN ASPART 100 UNIT/ML ~~LOC~~ SOLN: 0.0000 [IU] | Freq: Every day | SUBCUTANEOUS | Status: DC

## 2016-04-13 MED ORDER — FOLIC ACID 1 MG PO TABS
1.0000 mg | ORAL_TABLET | Freq: Every day | ORAL | Status: DC
  Administered 2016-04-13 – 2016-04-14 (×2): 1 mg via ORAL
  Filled 2016-04-13 (×2): qty 1

## 2016-04-13 MED ORDER — ATORVASTATIN CALCIUM 40 MG PO TABS
40.0000 mg | ORAL_TABLET | Freq: Every day | ORAL | Status: DC
  Administered 2016-04-13 – 2016-04-14 (×2): 40 mg via ORAL
  Filled 2016-04-13 (×2): qty 1

## 2016-04-13 MED ORDER — SODIUM CHLORIDE 0.9 % IV SOLN: 250.0000 mL | INTRAVENOUS | Status: DC | PRN

## 2016-04-13 MED ORDER — ADULT MULTIVITAMIN W/MINERALS CH
1.0000 | ORAL_TABLET | Freq: Every day | ORAL | Status: DC
  Administered 2016-04-13 – 2016-04-14 (×2): 1 via ORAL
  Filled 2016-04-13 (×2): qty 1

## 2016-04-13 MED ORDER — HEPARIN SODIUM (PORCINE) 5000 UNIT/ML IJ SOLN: 5000.0000 [IU] | Freq: Three times a day (TID) | INTRAMUSCULAR | Status: DC

## 2016-04-13 MED ORDER — LORAZEPAM 2 MG/ML IJ SOLN: 1.0000 mg | Freq: Four times a day (QID) | INTRAMUSCULAR | Status: DC | PRN

## 2016-04-13 MED ORDER — ALBUTEROL SULFATE (2.5 MG/3ML) 0.083% IN NEBU: 3.0000 mL | INHALATION_SOLUTION | RESPIRATORY_TRACT | Status: DC | PRN

## 2016-04-13 MED ORDER — MOMETASONE FURO-FORMOTEROL FUM 100-5 MCG/ACT IN AERO
2.0000 | INHALATION_SPRAY | Freq: Two times a day (BID) | RESPIRATORY_TRACT | Status: DC
  Administered 2016-04-13 – 2016-04-14 (×3): 2 via RESPIRATORY_TRACT
  Filled 2016-04-13: qty 8.8

## 2016-04-13 MED ORDER — LORAZEPAM 1 MG PO TABS
1.0000 mg | ORAL_TABLET | Freq: Four times a day (QID) | ORAL | Status: DC | PRN
  Administered 2016-04-13 (×2): 1 mg via ORAL
  Filled 2016-04-13 (×2): qty 1

## 2016-04-13 MED ORDER — THIAMINE HCL 100 MG/ML IJ SOLN
100.0000 mg | Freq: Every day | INTRAMUSCULAR | Status: DC
  Filled 2016-04-13: qty 1

## 2016-04-13 MED ORDER — VITAMIN B-1 100 MG PO TABS
100.0000 mg | ORAL_TABLET | Freq: Every day | ORAL | Status: DC
  Administered 2016-04-13 – 2016-04-14 (×2): 100 mg via ORAL
  Filled 2016-04-13 (×2): qty 1

## 2016-04-13 MED ORDER — TIOTROPIUM BROMIDE MONOHYDRATE 18 MCG IN CAPS
18.0000 ug | ORAL_CAPSULE | Freq: Every day | RESPIRATORY_TRACT | Status: DC
  Administered 2016-04-13 – 2016-04-14 (×2): 18 ug via RESPIRATORY_TRACT
  Filled 2016-04-13: qty 5

## 2016-04-13 NOTE — Procedures (Signed)
ELECTROENCEPHALOGRAM REPORT  Date of Study: 04/13/2016  Patient's Name: Daniel BoutonKenneth D Jakubek MRN: [id number] Date of Birth: 27-Mar-1950  Referring Provider: Ozzie HoyleKatalina M. Janyth ContesEubanks, NP  Clinical History: 66 year old man with history of alcohol abuse who presents with hypoxia following possible seizure after having stopped alcohol abruptly for an upcoming surgery.  Medications: albuterol (PROVENTIL) (2.5 MG/3ML) 0.083% nebulizer solution 3 mL  aspirin chewable tablet 81 mg  atorvastatin (LIPITOR) tablet 40 mg  clopidogrel (PLAVIX) tablet 75 mg  folic acid (FOLVITE) tablet 1 mg  insulin aspart (novoLOG) injection 2-6 Units  LORazepam (ATIVAN) 1 mg  metoprolol tartrate (LOPRESSOR) tablet 12.5 mg  mometasone-formoterol (DULERA) 100-5 MCG/ACT inhaler 2 puff  multivitamin with minerals tablet 1 tablet  thiamine (B-1)  tiotropium (SPIRIVA) inhalation capsule 18 mcg   Technical Summary: A multichannel digital EEG recording measured by the international 10-20 system with electrodes applied with paste and impedances below 5000 ohms performed in our laboratory with EKG monitoring in an awake and drowsy patient.  Hyperventilation and photic stimulation were not performed.  The digital EEG was referentially recorded, reformatted, and digitally filtered in a variety of bipolar and referential montages for optimal display.    Description: The patient is awake and drowsy during the recording.  During maximal wakefulness, there is a symmetric, medium and low voltage 9 Hz posterior dominant rhythm that attenuates with eye opening.  The record is symmetric.  During drowsiness, there is an increase in theta slowing of the background.  Stage 2 sleep was not seen.  There were no epileptiform discharges or electrographic seizures seen.    EKG lead was unremarkable.  Impression: This awake and drowsy EEG is normal.    Clinical Correlation: A normal EEG does not exclude a clinical diagnosis of epilepsy.  If  further clinical questions remain, prolonged EEG may be helpful.  Clinical correlation is advised.  Shon MilletAdam Linden Mikes, DO

## 2016-04-13 NOTE — Progress Notes (Signed)
Pt transferred to 5W39 without incident.  Bedside report to Mikey CollegeQuinetta Lofton RN.

## 2016-04-13 NOTE — Progress Notes (Signed)
eLink Physician-Brief Progress Note Patient Name: Daniel BoutonKenneth D [name] DOB: [birth year] MRN: [id number]   Date of Service  04/13/2016  HPI/Events of Note  Headache, eoth use  eICU Interventions  tylenal once, limit to 2 grams in 1 day     Intervention Category Minor Interventions: Routine modifications to care plan (e.g. PRN medications for pain, fever)  [name],Daniel Banwart J. 04/13/2016, 4:20 PM

## 2016-04-13 NOTE — Progress Notes (Signed)
Bedside EEG completed, results pending. 

## 2016-04-13 NOTE — H&P (Signed)
PULMONARY / CRITICAL CARE MEDICINE   Name: [name] MRN: [id number] DOB: Nov 09, 1950    ADMISSION DATE:  04/13/2016 CONSULTATION DATE:  04/13/2016  REFERRING MD:  Dr. Gladis Riffle ED  CHIEF COMPLAINT:  Seizure  HISTORY OF PRESENT ILLNESS:   66 year old male with PMH as below, which is significant for AAA, COPD, HTN, HLD. He was in his usual state of health until 3/7 early AM when his wife was awoken by the sound of sonorous respirations. She found her husband to be confused. Upon EMS arrival it seemed as though he had bitten his tongue and he was hypoxic (88% on room air). He was transported to ED for presumed seizure. - wife reports that she witnessed this event, patient was screaming and then went stiff and foaming at the mouth, had urinary incontinence. He does not have a history of seizure. Upon arrival to the ED his mental status was slowly improving. He had a marked metabolic acidosis due to lactic in the setting of presumed seizure vs infection. CT of the head described no acute intracranial process. Ct abdomen was done to evaluate his AAA, which remained stable. He was given Keppra 1G in ED. He was transferred to [facility] for further evaluation.   Per patient he drinks 5-6 beers a day for the last 15 years. On Sunday he decided to stop due to upcoming surgery and new HTN medications. Recently admitted 12/24-12/29 for retrosternal chest pain and progressive SOB. Treated with Bipap, solu-medrol, and antibiotics. Down-trended troponin, heart cath revealed severe aortic stenosis. Patient sent home on 2L [place], has since been able to titrate  PAST MEDICAL HISTORY :  He  has a past medical history of AAA (abdominal aortic aneurysm) (HCC); COPD (chronic obstructive pulmonary disease) (HCC); Fatty liver; Hyperlipidemia; Hypertension; and White coat hypertension.  PAST SURGICAL HISTORY: He  has a past surgical history that includes Appendectomy; Colonoscopy (06/22/2010); Ankle  surgery (Right, 2009); Colonoscopy with propofol (N/A, 05/15/2014); polypectomy (N/A, 05/15/2014); and Cardiac catheterization (N/A, 02/02/2016).  No Known Allergies  No current facility-administered medications on file prior to encounter.    Current Outpatient Prescriptions on File Prior to Encounter  Medication Sig  . albuterol (VENTOLIN HFA) 108 (90 Base) MCG/ACT inhaler INHALE TWO PUFFS BY MOUTH EVERY 4 HOURS AS NEEDED  . aspirin 81 MG tablet Take 81 mg by mouth daily.  [name] atorvastatin (LIPITOR) 40 MG tablet Take 1 tablet (40 mg total) by mouth daily.  . clopidogrel (PLAVIX) 75 MG tablet Take 1 tablet (75 mg total) by mouth daily with breakfast.  . fexofenadine (ALLEGRA) 180 MG tablet Take 180 mg by mouth daily.  . fish oil-omega-3 fatty acids 1000 MG capsule Take 2 g by mouth daily.  . Fluticasone-Salmeterol (ADVAIR DISKUS) 100-50 MCG/DOSE AEPB INHALE 1 DOSE BY MOUTH TWICE DAILY  . levofloxacin (LEVAQUIN) 750 MG tablet Take 1 tablet (750 mg total) by mouth daily.  . metoprolol tartrate (LOPRESSOR) 25 MG tablet Take 0.5 tablets (12.5 mg total) by mouth 2 (two) times daily.  . mometasone (NASONEX) 50 MCG/ACT nasal spray Place 2 sprays into the nose daily.  . Multiple Vitamin (MULTIVITAMIN) tablet Take 1 tablet by mouth daily.  . nitroGLYCERIN (NITROSTAT) 0.4 MG SL tablet Place 1 tablet (0.4 mg total) under the tongue every 5 (five) minutes x 3 doses as needed for chest pain.  [name] tiotropium (SPIRIVA HANDIHALER) 18 MCG inhalation capsule INHALE 1 DOSE ONCE DAILY  . vitamin C (ASCORBIC ACID) 500 MG tablet  Take 500 mg by mouth daily. Reported on 07/16/2015    FAMILY HISTORY:  His indicated that his mother is deceased. He indicated that his father is deceased. He indicated that the status of his sister is unknown. He indicated that the status of his brother is unknown. He indicated that the status of his neg hx is unknown.    SOCIAL HISTORY: He  reports that he quit smoking about 14 years ago. His  smoking use included Cigarettes. He has a 30.00 pack-year smoking history. He has never used smokeless tobacco. He reports that he drinks alcohol. He reports that he does not use drugs.  REVIEW OF SYSTEMS:   All negative; except for those that are bolded, which indicate positives.  Constitutional: weight loss, weight gain, night sweats, fevers, chills, fatigue, weakness.  HEENT: headaches, sore throat, sneezing, nasal congestion, post nasal drip, difficulty swallowing, tooth/dental problems, visual complaints, visual changes, ear aches. Neuro: difficulty with speech, weakness, numbness, ataxia. CV:  chest pain, orthopnea, PND, swelling in lower extremities, dizziness, palpitations, syncope.  Resp: cough, hemoptysis, dyspnea, wheezing. GI: heartburn, indigestion, abdominal pain, nausea, vomiting, diarrhea, constipation, change in bowel habits, loss of appetite, hematemesis, melena, hematochezia.  GU: dysuria, change in color of urine, urgency or frequency, flank pain, hematuria. MSK: joint pain or swelling, decreased range of motion. Psych: change in mood or affect, depression, anxiety, suicidal ideations, homicidal ideations. Skin: rash, itching, bruising.   SUBJECTIVE:  On nasal cannula. No distress. Wife at bedside.   VITAL SIGNS: BP 106/69 (BP Location: Right Arm)   Pulse 83   Temp 97.4 F (36.3 C) (Oral)   Resp (!) 22   SpO2 95%   HEMODYNAMICS:    VENTILATOR SETTINGS:    INTAKE / OUTPUT: No intake/output data recorded.  PHYSICAL EXAMINATION: General:  Adult male, no distress, lying in bed  Neuro:  Lethargic, follows commands, moves all extremities  HEENT:  Normocephalic  Cardiovascular:  RRR, no MRG, NI S1/S2  Lungs:  Non-labored, no wheezing or crackles  Abdomen:  Non-tender, active bowel sounds  Musculoskeletal:  No acute  Skin:  Warm, dry, intact   LABS:  BMET No results for input(s): NA, K, CL, CO2, BUN, CREATININE, GLUCOSE in the last 168  hours.  Electrolytes No results for input(s): CALCIUM, MG, PHOS in the last 168 hours.  CBC No results for input(s): WBC, HGB, HCT, PLT in the last 168 hours.  Coag's No results for input(s): APTT, INR in the last 168 hours.  Sepsis Markers No results for input(s): LATICACIDVEN, PROCALCITON, O2SATVEN in the last 168 hours.  ABG No results for input(s): PHART, PCO2ART, PO2ART in the last 168 hours.  Liver Enzymes No results for input(s): AST, ALT, ALKPHOS, BILITOT, ALBUMIN in the last 168 hours.  Cardiac Enzymes No results for input(s): TROPONINI, PROBNP in the last 168 hours.  Glucose  Recent Labs Lab 04/13/16 0646  GLUCAP 109*    Imaging No results found.   STUDIES:  ECHO 12/27 > EF 55-60, severe stenosis of aortic valve  CT head 3/7 > no acute intracranial process. Intracranial atherosclerosis CT abdomen 3/7 > stable fusiform infrarenal AAA 4.6cm. Dense atherosclerotic calcifications again noted of the common iliac arteries. Stable L sided parapelvic renal cysts.  CXR 3/7 > Interstitial edema superimposed on COPD, subtle airspace opacity at the right lung base medially cannot exclude pneumonia   Blood ETOH 3/7 > Less than 10 Salicylates 3/7 > 2.01 Acetaminophen > Less than 5.0   CULTURES: MRSA PCR 12/25 >  Neg Blood (OSH) 3/7 >>   ANTIBIOTICS: Rocephin 3/7   SIGNIFICANT EVENTS: 3/7 > Presents to ED   LINES/TUBES:   DISCUSSION: 66 year old male with PMH of AAA, COPD, HTN, HLD. Presents to OSH ED on 3/7 found at home with sonorous respirations and confused. Upon EMS arrival it seemed as though he had bitten his tongue and he was hypoxic (88% on room air). Transferred to [facility] for further management.   ASSESSMENT / PLAN:  PULMONARY A: Acute on Chronic Hypoxic/Hypercarbic Respiratory Failure  H/O COPD P:   Maintain Saturation > 92 Pulmonary Hygiene > IS, mobilize, flutter valve  PRN Albuterol  Spiriva Daily and Dulera BID   CARDIOVASCULAR A:  H/O  AAA, HTN, Hyperlipidemia, Severe aortic stenosis  P:  Cardiac Monitoring  Maintain MAP > 65  Continue home Lipitor, ASA, Lopressor BID Continue Plavix   RENAL A:   Metabolic Acidosis  AKI (Base Crt 0.5-0.8) Lactic Acidosis in setting of seizure vs infection  P:   Trend Lactic Acid Trend BMP Replace electrolytes as needed   GASTROINTESTINAL A:   Fatty Liver  P:   Trend LFT   HEMATOLOGIC A:   No issues  P:  Trend CBC   INFECTIOUS A:   Leukocytosis  Elevated Lactic Acid  P:   Trend WBC and fever curve Trend lactic acid and Procal   ENDOCRINE A:   Hyperglycemia  P:   SSI Q4H Glucose Checks  NEUROLOGIC A:   ? Presumed Seizure (?ETOH withdrawal) vs Metabolic Encephalopathy secondary to Acidosis  H/O ETOH  P:   EEG CIWA Monitor  RASS goal: 0     FAMILY  - Updates: no family at bedside   - Inter-disciplinary family meet or Palliative Care meeting due by:  3/14   Jovita Kussmaul, AG-ACNP [facility]  Pgr: [phone]  PCCM Pgr: [phone]

## 2016-04-14 LAB — COMPREHENSIVE METABOLIC PANEL
ALT: 25 U/L (ref 17–63)
AST: 63 U/L — ABNORMAL HIGH (ref 15–41)
Albumin: 3.6 g/dL (ref 3.5–5.0)
Alkaline Phosphatase: 46 U/L (ref 38–126)
Anion gap: 7 (ref 5–15)
BUN: 8 mg/dL (ref 6–20)
CO2: 29 mmol/L (ref 22–32)
Calcium: 9 mg/dL (ref 8.9–10.3)
Chloride: 105 mmol/L (ref 101–111)
Creatinine, Ser: 0.82 mg/dL (ref 0.61–1.24)
GFR calc Af Amer: >60 mL/min (ref >60)
GFR calc non Af Amer: >60 mL/min (ref >60)
Glucose, Bld: 112 mg/dL — ABNORMAL HIGH (ref 65–99)
Potassium: 3.9 mmol/L (ref 3.5–5.1)
Sodium: 141 mmol/L (ref 135–145)
Total Bilirubin: 1 mg/dL (ref 0.3–1.2)
Total Protein: 6.5 g/dL (ref 6.5–8.1)

## 2016-04-14 LAB — CBC
HCT: 48.1 % (ref 39.0–52.0)
Hemoglobin: 15.7 g/dL (ref 13.0–17.0)
MCH: 30.8 pg (ref 26.0–34.0)
MCHC: 32.6 g/dL (ref 30.0–36.0)
MCV: 94.5 fL (ref 78.0–100.0)
Platelets: 166 10*3/uL (ref 150–400)
RBC: 5.09 MIL/uL (ref 4.22–5.81)
RDW: 13.4 % (ref 11.5–15.5)
WBC: 9.8 10*3/uL (ref 4.0–10.5)

## 2016-04-14 LAB — GLUCOSE, CAPILLARY
Glucose-Capillary: 91 mg/dL (ref 65–99)
Glucose-Capillary: 116 mg/dL — ABNORMAL HIGH (ref 65–99)

## 2016-04-14 LAB — MAGNESIUM: Magnesium: 2 mg/dL (ref 1.7–2.4)

## 2016-04-14 MED ORDER — OXYCODONE-ACETAMINOPHEN 5-325 MG PO TABS: 1.0000 | ORAL_TABLET | Freq: Four times a day (QID) | ORAL | Status: DC | PRN

## 2016-04-14 MED ORDER — FOLIC ACID 1 MG PO TABS: 1.0000 mg | ORAL_TABLET | Freq: Every day | ORAL | 1 refills | Status: DC

## 2016-04-14 MED ORDER — ACETAMINOPHEN 325 MG PO TABS: 650.0000 mg | ORAL_TABLET | Freq: Four times a day (QID) | ORAL | Status: DC | PRN

## 2016-04-14 MED ORDER — THIAMINE HCL 100 MG PO TABS: 100.0000 mg | ORAL_TABLET | Freq: Every day | ORAL | 1 refills | Status: DC

## 2016-04-14 MED ORDER — OXYCODONE-ACETAMINOPHEN 5-325 MG PO TABS
1.0000 | ORAL_TABLET | Freq: Four times a day (QID) | ORAL | Status: DC | PRN
  Administered 2016-04-14: 1 via ORAL
  Filled 2016-04-14: qty 1

## 2016-04-14 NOTE — Progress Notes (Signed)
Daniel JensenRobertson [name] 66 y.o male  arrived from 57M unit with his nurse and nurse tech.  Patient is alert is oriented x 4.  Set up seizure precautions due to diagnosis. Set up his telemetry box for cardiac monitoring.  Skin assessment complete. IV in the left and right forearm.  Educated the patient on how to contact the staff. Activated the bed alarm, lowered the bed, and placed the call bell within reach.  Will continue to monitor the patient

## 2016-04-14 NOTE — Discharge Summary (Signed)
Physician Discharge Summary  Patient ID: [name] MRN: [id number] DOB/AGE: May 06, 1950 66 y.o.  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 y/o male with a PMH of AAA, COPD, HTN, HLD. He was in his usual state of health until 3/7 early AM when his wife was awoken by the sound of sonorous respirations. She found her husband to be confused. Upon EMS arrival it seemed as though he had bitten his tongue and he was hypoxic (88% on room air). He was transported to ED for presumed seizure. - wife reports that she witnessed this event, patient was screaming and then went stiff and foaming at the mouth, had urinary incontinence. He does not have a history of seizure. Upon arrival to the ED his mental status was slowly improving. He had a marked metabolic acidosis due to lactic in the setting of presumed seizure vs infection. CT of the head described no acute intracranial process. Ct abdomen was done  to evaluate his AAA, which remained stable. He was given Keppra 1G in ED. He was transferred to [facility] for [facility] evaluation. Upon arrival to [facility] patient reported that he drinks 5-6 beers a day for the last 15 years. On Sunday he decided to stop due to upcoming surgery and new HTN medications. Patient was placed on the CIWA protocol and experienced no withdrawal symptoms or continued seizure activity while in the hospital. On 3/8 patient was deemed ready for discharge with follow up appointment to primary care doctor.           SIGNIFICANT DIAGNOSTIC STUDIES ECHO 12/27 > EF 55-60, severe stenosis of aortic valve  CT head 3/7 > no acute intracranial process. Intracranial atherosclerosis CT abdomen 3/7 > stable fusiform infrarenal AAA 4.6cm. Dense atherosclerotic calcifications again noted of the common iliac arteries. Stable L sided parapelvic renal cysts.  CXR 3/7 > Interstitial edema superimposed on COPD, subtle airspace opacity at the right lung base medially cannot exclude pneumonia  EEG 3/7 > Increase slowing in the background, no epileptiform discharges or electrographic seizures   MICRO DATA  MRSA PCR 3/7 > Negative    Discharge Exam: General: Adult  male, no distress, sitting in chair  Neuro: Alert, oriented, follows commands, fully intact  CV: RRR PULM: Clear breath sounds, no wheeze, non-labored  GI: obese, non-tender, active bowel sounds  Extremities: warm, dry, intact   Vitals:   04/14/16 0800 04/14/16 0824 04/14/16 1300 04/14/16 1424  BP: (!) 152/54  (!) 144/81 (!) 143/82  Pulse: 86  84 88  Resp:    18  Temp:    98.7 F (37.1 C)  TempSrc:    Oral  SpO2:  98%  93%  Weight:      Height:         Discharge Labs  BMET  Recent Labs Lab 04/13/16 0818 04/14/16 0752  NA 141 141  K 3.8 3.9  CL 104 105  CO2 26 29  GLUCOSE 104* 112*  BUN 13 8  CREATININE 0.84 0.82  CALCIUM 8.9 9.0  MG 2.0 2.0  PHOS 3.5  --     CBC  Recent Labs Lab 04/13/16 0818  04/14/16 0752  HGB 15.3 15.7  HCT 45.3 48.1  WBC 11.1* 9.8  PLT 164 166    Anti-Coagulation No results for input(s): INR in the last 168 hours.  Discharge Instructions    Call MD for:  difficulty breathing, headache or visual disturbances    Complete by:  As directed    Call MD for:  persistant dizziness or light-headedness    Complete by:  As directed    Diet - low sodium heart healthy    Complete by:  As directed    Increase activity slowly    Complete by:  As directed           Allergies as of 04/14/2016   No Known Allergies     Medication List    TAKE these medications   albuterol 108 (90 Base) MCG/ACT inhaler Commonly known as:  VENTOLIN HFA INHALE TWO PUFFS BY MOUTH EVERY 4 HOURS AS NEEDED What changed:  how much to take  how to take this  when to take this  reasons to take this  additional instructions   aspirin 81 MG tablet Take 81 mg by mouth at bedtime.   atorvastatin 40 MG tablet Commonly known as:  LIPITOR Take 1 tablet (40 mg total) by mouth daily.   clopidogrel 75 MG tablet Commonly known as:  PLAVIX Take 1 tablet (75 mg total) by mouth daily with breakfast.   fexofenadine 180 MG tablet Commonly known as:  ALLEGRA Take 180 mg by mouth daily.   fish oil-omega-3 fatty acids 1000 MG capsule Take 1 g by mouth 2 (two) times daily.   Fluticasone-Salmeterol 100-50 MCG/DOSE Aepb Commonly known as:  ADVAIR DISKUS INHALE 1 DOSE BY MOUTH TWICE DAILY   folic acid 1 MG tablet Commonly known as:  FOLVITE Take 1 tablet (1 mg total) by mouth daily. Start taking on:  04/15/2016   metoprolol tartrate 25 MG tablet Commonly known as:  LOPRESSOR Take 0.5 tablets (12.5 mg total) by mouth 2 (two) times daily.   mometasone 50 MCG/ACT nasal spray Commonly known as:  NASONEX Place 2 sprays into the nose daily. What changed:  when to take this  reasons to take this   multivitamin tablet Take 1 tablet by mouth daily.   nitroGLYCERIN 0.4 MG SL  tablet Commonly known as:  NITROSTAT Place 1 tablet (0.4 mg total) under the tongue every 5 (five) minutes x 3 doses as needed for chest pain.   thiamine 100 MG tablet Take 1  tablet (100 mg total) by mouth daily. Start taking on:  04/15/2016   tiotropium 18 MCG inhalation capsule Commonly known as:  SPIRIVA HANDIHALER INHALE 1 DOSE ONCE DAILY What changed:  how much to take  how to take this  when to take this  additional instructions   vitamin C 500 MG tablet Commonly known as:  ASCORBIC ACID Take 500 mg by mouth daily. Reported on 07/16/2015         Disposition: Patient is stable to go home with follow up appointment to primary care doctor.   Discharged Condition: [name] has met maximum benefit of inpatient care and is medically stable and cleared for discharge.  Patient is pending follow up as above.      Time spent on disposition:  Greater than 35 minutes.   Signed: Hayden Pedro, AG-ACNP [facility]  Pgr: [phone]  PCCM Pgr: [phone]

## 2016-04-14 NOTE — Progress Notes (Signed)
Lowell BoutonKenneth D Romanoski to be D/C'd Home per MD order.  Discussed with the patient and all questions fully answered.  VSS, Skin clean, dry and intact without evidence of skin break down, no evidence of skin tears noted. IV catheter discontinued intact. Site without signs and symptoms of complications. Dressing and pressure applied.  An After Visit Summary was printed and given to the patient. Patient received prescription.  D/c education completed with patient/family including follow up instructions, medication list, d/c activities limitations if indicated, with other d/c instructions as indicated by MD - patient able to verbalize understanding, all questions fully answered.   Patient instructed to return to ED, call 911, or call MD for any changes in condition.   Patient escorted via WC, and D/C home via private auto.  Melvenia Needlesreti O Shanequia Kendrick 04/14/2016 5:21 PM

## 2016-04-14 NOTE — Progress Notes (Signed)
SATURATION QUALIFICATIONS: (This note is used to comply with regulatory documentation for home oxygen)  Patient Saturations on Room Air at Rest = 90%  Patient Saturations on Room Air while Ambulating = 92%  Patient Saturations on 0 Liters of oxygen while Ambulating = 92%  Please briefly explain why patient needs home oxygen: 

## 2016-04-14 NOTE — Progress Notes (Signed)
PT Cancellation Note  Patient Details Name: Daniel BoutonKenneth D [name] MRN: [id number] DOB: 1950-11-29   Cancelled Treatment:    Reason Eval/Treat Not Completed: PT screened, no needs identified, will sign off. Pt and wife reports that he has been up and moving around in the room without any difficulty. Does not feel he will need any additional therapy services upon discharge. PT offered evaluation and client declines. No skilled need noted at this time. Please re-order of condition changes.    Colin BroachSabra M. Aruna Nestler PT, DPT  [phone]  04/14/2016, 12:27 PM

## 2016-04-19 ENCOUNTER — Ambulatory Visit (INDEPENDENT_AMBULATORY_CARE_PROVIDER_SITE_OTHER): Payer: BLUE CROSS/BLUE SHIELD | Admitting: Family Medicine

## 2016-04-19 ENCOUNTER — Telehealth: Payer: Self-pay | Admitting: Family Medicine

## 2016-04-19 ENCOUNTER — Encounter: Payer: Self-pay | Admitting: Family Medicine

## 2016-04-19 VITALS — BP 150/110 | Ht 69.0 in | Wt 197.5 lb

## 2016-04-19 DIAGNOSIS — I10 Essential (primary) hypertension: Secondary | ICD-10-CM | POA: Diagnosis not present

## 2016-04-19 MED ORDER — LISINOPRIL 10 MG PO TABS: 10.0000 mg | ORAL_TABLET | Freq: Every day | ORAL | 3 refills | Status: DC

## 2016-04-19 NOTE — Telephone Encounter (Signed)
Yes he was seen today thank you-we discussed this with him when he came in

## 2016-04-19 NOTE — Progress Notes (Signed)
   Subjective:    Patient ID: [name], male    DOB: Sep 21, 1950, 66 y.o.   MRN: [id number]  HPI Patient in today for a hospital follow up from   on 04/13/2016 for a seizure. Patient states that he has concerns of blood pressure staying elevated.  Patient was concerned about thiamine and fully casted that it may affect his heart disease. I think the affect on this would be very minimal I think it's very important for the patient continue this to minimize the risk of nutritional deficiencies related to alcohol use. Patient states he's no longer using alcohol does not intend to use alcohol any longer. He's never had a seizure before this.  His blood pressures been moderately elevated despite trying to watch his diet he used to be on lisinopril and HCTZ they pulled him off of that and put him on metoprolol blood pressure has not been under good control lately he denies excessive salt denies any chest tightness pressure pain  Does has COPD this is stable  Review of Systems Currently right now denies chest congestion wheezing difficulty breathing does relate no headaches or vomiting or blurred vision.    Objective:   Physical Exam Lungs are clear hearts regular pulse normal HEENT benign       Assessment & Plan:  Elevated blood pressure-best reading 160/98-initiate lisinopril 10 mg daily bring the patient back on Friday for recheck may have to adjust the dosage based on then. Avoid diuretic because of aortic stenosis  Cardiac condition follow-up with cardiologist more than likely getting valve replacement somewhere in the next 6-8 weeks  Aortic aneurysm stable follows with vascular surgery  Alcohol withdrawal seizure-patient is no longer drinking alcohol live caution him about going back. He needs to stay away from alcohol. I do not feel he has a seizure disorder

## 2016-04-19 NOTE — Telephone Encounter (Signed)
Patient coming to appointment today

## 2016-04-19 NOTE — Telephone Encounter (Signed)
[facility] and discharged on 04/14/16 with diagnosis of convulsions.  She just wanted to make sure Dr. Lorin PicketScott was aware.

## 2016-04-19 NOTE — Patient Instructions (Signed)
DASH Eating Plan DASH stands for "Dietary Approaches to Stop Hypertension." The DASH eating plan is a healthy eating plan that has been shown to reduce high blood pressure (hypertension). It may also reduce your risk for type 2 diabetes, heart disease, and stroke. The DASH eating plan may also help with weight loss. What are tips for following this plan? General guidelines  Avoid eating more than 2,300 mg (milligrams) of salt (sodium) a day. If you have hypertension, you may need to reduce your sodium intake to 1,500 mg a day.  Limit alcohol intake to no more than 1 drink a day for nonpregnant women and 2 drinks a day for men. One drink equals 12 oz of beer, 5 oz of wine, or 1 oz of hard liquor.  Work with your health care provider to maintain a healthy body weight or to lose weight. Ask what an ideal weight is for you.  Get at least 30 minutes of exercise that causes your heart to beat faster (aerobic exercise) most days of the week. Activities may include walking, swimming, or biking.  Work with your health care provider or diet and nutrition specialist (dietitian) to adjust your eating plan to your individual calorie needs. Reading food labels  Check food labels for the amount of sodium per serving. Choose foods with less than 5 percent of the Daily Value of sodium. Generally, foods with less than 300 mg of sodium per serving fit into this eating plan.  To find whole grains, look for the word "whole" as the first word in the ingredient list. Shopping  Buy products labeled as "low-sodium" or "no salt added."  Buy fresh foods. Avoid canned foods and premade or frozen meals. Cooking  Avoid adding salt when cooking. Use salt-free seasonings or herbs instead of table salt or sea salt. Check with your health care provider or pharmacist before using salt substitutes.  Do not fry foods. Cook foods using healthy methods such as baking, boiling, grilling, and broiling instead.  Cook with  heart-healthy oils, such as olive, canola, soybean, or sunflower oil. Meal planning   Eat a balanced diet that includes: ? 5 or more servings of fruits and vegetables each day. At each meal, try to fill half of your plate with fruits and vegetables. ? Up to 6-8 servings of whole grains each day. ? Less than 6 oz of lean meat, poultry, or fish each day. A 3-oz serving of meat is about the same size as a deck of cards. One egg equals 1 oz. ? 2 servings of low-fat dairy each day. ? A serving of nuts, seeds, or beans 5 times each week. ? Heart-healthy fats. Healthy fats called Omega-3 fatty acids are found in foods such as flaxseeds and coldwater fish, like sardines, salmon, and mackerel.  Limit how much you eat of the following: ? Canned or prepackaged foods. ? Food that is high in trans fat, such as fried foods. ? Food that is high in saturated fat, such as fatty meat. ? Sweets, desserts, sugary drinks, and other foods with added sugar. ? Full-fat dairy products.  Do not salt foods before eating.  Try to eat at least 2 vegetarian meals each week.  Eat more home-cooked food and less restaurant, buffet, and fast food.  When eating at a restaurant, ask that your food be prepared with less salt or no salt, if possible. What foods are recommended? The items listed may not be a complete list. Talk with your dietitian about what   dietary choices are best for you. Grains Whole-grain or whole-wheat bread. Whole-grain or whole-wheat pasta. Brown rice. Oatmeal. Quinoa. Bulgur. Whole-grain and low-sodium cereals. Pita bread. Low-fat, low-sodium crackers. Whole-wheat flour tortillas. Vegetables Fresh or frozen vegetables (raw, steamed, roasted, or grilled). Low-sodium or reduced-sodium tomato and vegetable juice. Low-sodium or reduced-sodium tomato sauce and tomato paste. Low-sodium or reduced-sodium canned vegetables. Fruits All fresh, dried, or frozen fruit. Canned fruit in natural juice (without  added sugar). Meat and other protein foods Skinless chicken or turkey. Ground chicken or turkey. Pork with fat trimmed off. Fish and seafood. Egg whites. Dried beans, peas, or lentils. Unsalted nuts, nut butters, and seeds. Unsalted canned beans. Lean cuts of beef with fat trimmed off. Low-sodium, lean deli meat. Dairy Low-fat (1%) or fat-free (skim) milk. Fat-free, low-fat, or reduced-fat cheeses. Nonfat, low-sodium ricotta or cottage cheese. Low-fat or nonfat yogurt. Low-fat, low-sodium cheese. Fats and oils Soft margarine without trans fats. Vegetable oil. Low-fat, reduced-fat, or light mayonnaise and salad dressings (reduced-sodium). Canola, safflower, olive, soybean, and sunflower oils. Avocado. Seasoning and other foods Herbs. Spices. Seasoning mixes without salt. Unsalted popcorn and pretzels. Fat-free sweets. What foods are not recommended? The items listed may not be a complete list. Talk with your dietitian about what dietary choices are best for you. Grains Baked goods made with fat, such as croissants, muffins, or some breads. Dry pasta or rice meal packs. Vegetables Creamed or fried vegetables. Vegetables in a cheese sauce. Regular canned vegetables (not low-sodium or reduced-sodium). Regular canned tomato sauce and paste (not low-sodium or reduced-sodium). Regular tomato and vegetable juice (not low-sodium or reduced-sodium). Pickles. Olives. Fruits Canned fruit in a light or heavy syrup. Fried fruit. Fruit in cream or butter sauce. Meat and other protein foods Fatty cuts of meat. Ribs. Fried meat. Bacon. Sausage. Bologna and other processed lunch meats. Salami. Fatback. Hotdogs. Bratwurst. Salted nuts and seeds. Canned beans with added salt. Canned or smoked fish. Whole eggs or egg yolks. Chicken or turkey with skin. Dairy Whole or 2% milk, cream, and half-and-half. Whole or full-fat cream cheese. Whole-fat or sweetened yogurt. Full-fat cheese. Nondairy creamers. Whipped toppings.  Processed cheese and cheese spreads. Fats and oils Butter. Stick margarine. Lard. Shortening. Ghee. Bacon fat. Tropical oils, such as coconut, palm kernel, or palm oil. Seasoning and other foods Salted popcorn and pretzels. Onion salt, garlic salt, seasoned salt, table salt, and sea salt. Worcestershire sauce. Tartar sauce. Barbecue sauce. Teriyaki sauce. Soy sauce, including reduced-sodium. Steak sauce. Canned and packaged gravies. Fish sauce. Oyster sauce. Cocktail sauce. Horseradish that you find on the shelf. Ketchup. Mustard. Meat flavorings and tenderizers. Bouillon cubes. Hot sauce and Tabasco sauce. Premade or packaged marinades. Premade or packaged taco seasonings. Relishes. Regular salad dressings. Where to find more information:  National Heart, Lung, and Blood Institute: www.nhlbi.nih.gov  American Heart Association: www.heart.org Summary  The DASH eating plan is a healthy eating plan that has been shown to reduce high blood pressure (hypertension). It may also reduce your risk for type 2 diabetes, heart disease, and stroke.  With the DASH eating plan, you should limit salt (sodium) intake to 2,300 mg a day. If you have hypertension, you may need to reduce your sodium intake to 1,500 mg a day.  When on the DASH eating plan, aim to eat more fresh fruits and vegetables, whole grains, lean proteins, low-fat dairy, and heart-healthy fats.  Work with your health care provider or diet and nutrition specialist (dietitian) to adjust your eating plan to your individual   calorie needs. This information is not intended to replace advice given to you by your health care provider. Make sure you discuss any questions you have with your health care provider. Document Released: 01/13/2011 Document Revised: 01/18/2016 Document Reviewed: 01/18/2016 Elsevier Interactive Patient Education  2017 Elsevier Inc.  

## 2016-04-22 ENCOUNTER — Encounter: Payer: Self-pay | Admitting: Family Medicine

## 2016-04-22 ENCOUNTER — Ambulatory Visit (INDEPENDENT_AMBULATORY_CARE_PROVIDER_SITE_OTHER): Payer: BLUE CROSS/BLUE SHIELD | Admitting: Family Medicine

## 2016-04-22 VITALS — BP 150/90 | Ht 69.0 in | Wt 196.4 lb

## 2016-04-22 DIAGNOSIS — E784 Other hyperlipidemia: Secondary | ICD-10-CM

## 2016-04-22 DIAGNOSIS — E7849 Other hyperlipidemia: Secondary | ICD-10-CM

## 2016-04-22 DIAGNOSIS — E639 Nutritional deficiency, unspecified: Secondary | ICD-10-CM

## 2016-04-22 DIAGNOSIS — I10 Essential (primary) hypertension: Secondary | ICD-10-CM | POA: Diagnosis not present

## 2016-04-22 NOTE — Patient Instructions (Signed)
Lisinopril 10mg  now use 2 per day then come next week for courtesy BP check

## 2016-04-22 NOTE — Progress Notes (Signed)
   Subjective:    Patient ID: Daniel BoutonKenneth D [name], male    DOB: March 07, 1950, 66 y.o.   MRN: [id number]  Hypertension  This is a chronic problem. The current episode started more than 1 year ago.   This patient states taken his medicine. Denies a chest tightness pressure pain. Denies shortness of breath.    Review of Systems Denies wheezing difficulty breathing nausea vomiting    Objective:   Physical Exam  Blood pressure mildly elevated lungs clear heart regular pulse normal Patient will need follow-up on his lab work including cholesterol now needs to keep LDL below 70 Also needs Foley cath said to be checked. Patient is staying away from alcohol     Assessment & Plan:  Patient's blood pressure not at goal Increase lisinopril 10 mg tablet Now take 2 daily Continue other medicines Courtesy blood pressure checked next week Lab work ordered

## 2016-04-28 ENCOUNTER — Other Ambulatory Visit: Payer: Self-pay | Admitting: Family Medicine

## 2016-04-28 ENCOUNTER — Telehealth: Payer: Self-pay | Admitting: Family Medicine

## 2016-04-28 MED ORDER — LISINOPRIL-HYDROCHLOROTHIAZIDE 20-25 MG PO TABS: 1.0000 | ORAL_TABLET | Freq: Every day | ORAL | 5 refills | Status: DC

## 2016-04-28 MED ORDER — LISINOPRIL-HYDROCHLOROTHIAZIDE 20-12.5 MG PO TABS: 1.0000 | ORAL_TABLET | Freq: Every day | ORAL | 5 refills | Status: DC

## 2016-04-28 NOTE — Telephone Encounter (Signed)
I did see this patient as a courtesy visit on Thursday at noon. Blood pressure still running mildly elevated. He is currently taking lisinopril 20 mg daily-2 of the 10 mg tablets-blood pressures are not where they need to be. We have initiated lisinopril-hydrochlorothiazide 20-12 0.5-patient will follow-up for his regular visit in 4 weeks send us blood pressure readings in several weeks

## 2016-05-16 ENCOUNTER — Encounter: Payer: Self-pay | Admitting: Family Medicine

## 2016-05-16 LAB — BASIC METABOLIC PANEL
BUN: 11 mg/dL (ref 7–25)
CO2: 34 mmol/L — ABNORMAL HIGH (ref 20–31)
Calcium: 9.8 mg/dL (ref 8.6–10.3)
Chloride: 103 mmol/L (ref 98–110)
Creat: 0.73 mg/dL (ref 0.70–1.25)
Glucose, Bld: 98 mg/dL (ref 65–99)
Potassium: 4.9 mmol/L (ref 3.5–5.3)
Sodium: 142 mmol/L (ref 135–146)

## 2016-05-16 LAB — HEPATIC FUNCTION PANEL
ALT: 19 U/L (ref 9–46)
AST: 21 U/L (ref 10–35)
Albumin: 4.3 g/dL (ref 3.6–5.1)
Alkaline Phosphatase: 48 U/L (ref 40–115)
Bilirubin, Direct: 0.1 mg/dL (ref <=0.2)
Indirect Bilirubin: 0.3 mg/dL (ref 0.2–1.2)
Total Bilirubin: 0.4 mg/dL (ref 0.2–1.2)
Total Protein: 6.9 g/dL (ref 6.1–8.1)

## 2016-05-16 LAB — LIPID PANEL
Cholesterol: 136 mg/dL (ref <200)
HDL: 35 mg/dL — ABNORMAL LOW (ref >40)
LDL Cholesterol: 56 mg/dL (ref <100)
Total CHOL/HDL Ratio: 3.9 Ratio (ref <5.0)
Triglycerides: 225 mg/dL — ABNORMAL HIGH (ref <150)
VLDL: 45 mg/dL — ABNORMAL HIGH (ref <30)

## 2016-05-16 LAB — FOLATE: Folate: 22 ng/mL (ref >5.4)

## 2016-06-02 ENCOUNTER — Encounter: Payer: Self-pay | Admitting: Family Medicine

## 2016-06-02 ENCOUNTER — Ambulatory Visit (INDEPENDENT_AMBULATORY_CARE_PROVIDER_SITE_OTHER): Payer: BLUE CROSS/BLUE SHIELD | Admitting: Family Medicine

## 2016-06-02 VITALS — BP 124/78 | Ht 69.0 in | Wt 191.1 lb

## 2016-06-02 DIAGNOSIS — J449 Chronic obstructive pulmonary disease, unspecified: Secondary | ICD-10-CM

## 2016-06-02 DIAGNOSIS — I519 Heart disease, unspecified: Secondary | ICD-10-CM | POA: Diagnosis not present

## 2016-06-02 DIAGNOSIS — E784 Other hyperlipidemia: Secondary | ICD-10-CM

## 2016-06-02 DIAGNOSIS — J301 Allergic rhinitis due to pollen: Secondary | ICD-10-CM

## 2016-06-02 DIAGNOSIS — I10 Essential (primary) hypertension: Secondary | ICD-10-CM | POA: Diagnosis not present

## 2016-06-02 DIAGNOSIS — I359 Nonrheumatic aortic valve disorder, unspecified: Secondary | ICD-10-CM | POA: Diagnosis not present

## 2016-06-02 DIAGNOSIS — E7849 Other hyperlipidemia: Secondary | ICD-10-CM

## 2016-06-02 MED ORDER — FLUTICASONE-SALMETEROL 100-50 MCG/DOSE IN AEPB: INHALATION_SPRAY | RESPIRATORY_TRACT | 5 refills | Status: DC

## 2016-06-02 MED ORDER — ATORVASTATIN CALCIUM 40 MG PO TABS: 40.0000 mg | ORAL_TABLET | Freq: Every day | ORAL | 5 refills | Status: DC

## 2016-06-02 MED ORDER — CLOPIDOGREL BISULFATE 75 MG PO TABS: 75.0000 mg | ORAL_TABLET | Freq: Every day | ORAL | 5 refills | Status: DC

## 2016-06-02 MED ORDER — METOPROLOL TARTRATE 25 MG PO TABS: 12.5000 mg | ORAL_TABLET | Freq: Two times a day (BID) | ORAL | 5 refills | Status: DC

## 2016-06-02 MED ORDER — TIOTROPIUM BROMIDE MONOHYDRATE 18 MCG IN CAPS: 18.0000 ug | ORAL_CAPSULE | Freq: Every day | RESPIRATORY_TRACT | 5 refills | Status: DC

## 2016-06-02 MED ORDER — MOMETASONE FUROATE 50 MCG/ACT NA SUSP: 2.0000 | Freq: Every day | NASAL | 5 refills | Status: DC | PRN

## 2016-06-02 NOTE — Progress Notes (Signed)
   Subjective:    Patient ID: [name], male    DOB: 26-Jul-1950, 65 y.o.   MRN: [id number]  Hypertension  This is a chronic problem. The current episode started more than 1 year ago. The problem has been gradually improving since onset. Pertinent negatives include no chest pain, headaches or shortness of breath. There are no associated agents to hypertension. There are no known risk factors for coronary artery disease. Treatments tried: metoprolol. The current treatment provides moderate improvement. There are no compliance problems.    Patient states that he had a mild heart attack back in December and needs a referral to a specialist concerning this.  Patient relates that the cardiologist initially told him he would need to have a aortic valve replacement but then told him that he needed this is confused and he would like to get a second opinion Patient does state he eats taken his heart medicines on a regular basis Takes cholesterol medicine watch his diet Patient also uses his COPD medicine is able to do some walking Having some reactive airway disease but it's under good control with current measures Allergies under good control   Review of Systems  Constitutional: Negative for activity change, fatigue and fever.  Respiratory: Negative for cough and shortness of breath.   Cardiovascular: Negative for chest pain and leg swelling.  Neurological: Negative for headaches.       Objective:   Physical Exam  Constitutional: He appears well-nourished. No distress.  Cardiovascular: Normal rate, regular rhythm and normal heart sounds.   No murmur heard. Pulmonary/Chest: Effort normal and breath sounds normal. No respiratory distress.  Musculoskeletal: He exhibits no edema.  Lymphadenopathy:    He has no cervical adenopathy.  Neurological: He is alert.  Psychiatric: His behavior is normal.  Vitals reviewed.         Assessment & Plan:  Blood pressure good control continue  current measures Heart disease stable get a second opinion from cardiology we will help set this up COPD issues continue current medication patient no longer smokes Allergies continue current meds Hyperlipidemia previous labs reviewed with patient continue current measures  Patient was told that he could stop folate and thiamine he is no longer drinking alcohol His lab work was reviewed with the patient

## 2016-06-02 NOTE — Addendum Note (Signed)
Addended by: Metro Kung on: 06/02/2016 02:23 PM   Modules accepted: Orders

## 2016-06-11 ENCOUNTER — Other Ambulatory Visit: Payer: Self-pay | Admitting: Family Medicine

## 2016-06-17 ENCOUNTER — Encounter: Payer: Self-pay | Admitting: Cardiovascular Disease

## 2016-06-17 ENCOUNTER — Ambulatory Visit (INDEPENDENT_AMBULATORY_CARE_PROVIDER_SITE_OTHER): Payer: BLUE CROSS/BLUE SHIELD | Admitting: Cardiovascular Disease

## 2016-06-17 ENCOUNTER — Encounter: Payer: Self-pay | Admitting: *Deleted

## 2016-06-17 VITALS — BP 150/98 | HR 61 | Ht 69.0 in | Wt 190.0 lb

## 2016-06-17 DIAGNOSIS — I714 Abdominal aortic aneurysm, without rupture, unspecified: Secondary | ICD-10-CM

## 2016-06-17 DIAGNOSIS — I25118 Atherosclerotic heart disease of native coronary artery with other forms of angina pectoris: Secondary | ICD-10-CM | POA: Diagnosis not present

## 2016-06-17 DIAGNOSIS — I252 Old myocardial infarction: Secondary | ICD-10-CM | POA: Diagnosis not present

## 2016-06-17 DIAGNOSIS — I10 Essential (primary) hypertension: Secondary | ICD-10-CM

## 2016-06-17 DIAGNOSIS — E782 Mixed hyperlipidemia: Secondary | ICD-10-CM | POA: Diagnosis not present

## 2016-06-17 DIAGNOSIS — I350 Nonrheumatic aortic (valve) stenosis: Secondary | ICD-10-CM

## 2016-06-17 NOTE — Progress Notes (Signed)
CARDIOLOGY CONSULT NOTE  Patient ID: [name] [name] MRN: [id number] DOB/AGE: July 29, 1950 66 y.o.  Admit date: (Not on file) Primary Physician: Babs SciaraLuking, [name] A, MD Referring Physician: Gerda DissLuking  Reason for Consultation: CAD, aortic valve disease  HPI: [name] is a [age] y.o. male who is being seen today for the evaluation of coronary artery disease and aortic valve disease at the request of [name], Jonna CoupScott A, MD.   He also has a history of COPD, hypertension, and hyperlipidemia.  He saw Dr. Sharyn LullHarwani in 01/2016.  I reviewed all relevant documentation, labs, and studies.  He was rehospitalized for a non-STEMI and COPD exacerbation in December 2017.  He underwent coronary angiography on 02/02/16 which demonstrated the following:  Prox RCA lesion, 40 %stenosed.  Mid RCA lesion, 30 %stenosed.  Ost Cx to Prox Cx lesion, 40 %stenosed.  Prox LAD lesion, 20 %stenosed.  Mid LAD lesion, 40 %stenosed.  Troponins appear to have peaked at 1.64 on Christmas day.  Echocardiogram 02/03/16 demonstrated normal left ventricular systolic function, LVEF 55-60%, possible hypokinesis of the apical myocardium, and severe aortic stenosis, mean gradient reported as 38 mmHg. Valve area was reported at 0.637 m.  He also has an abdominal aortic aneurysm measuring 4.7 x 4.6 cm on 01/28/16. He is followed by vascular surgery for this.  I personally interpreted ECG performed on 02/03/16 which showed sinus rhythm with T-wave inversions in leads V1 through V3.  Lipid panel 05/16/16: Total cholesterol 136, trig glycerides 225, HDL 35, LDL 56.   He said overall he is feeling well. He does yard work and pressure washes without difficulty. He walks 1.5 miles every morning on a track with his wife and occasionally has to stop for 15-20 seconds to catch his breath. He denies palpitations, lightheadedness, dizziness, orthopnea, paroxysmal nocturnal dyspnea, leg swelling, near syncope, and  syncope.  He does notice that he has been more short of breath in the past 12 months.  He has never been hospitalized for congestive heart failure.  He said he has white coat hypertension. He checks his blood pressure routinely at home and it is in the 120/70 range. His PCP has evaluated the patient's blood pressure cuff and has cooborated the data.   No Known Allergies  Current Outpatient Prescriptions  Medication Sig Dispense Refill  . ADVAIR DISKUS 100-50 MCG/DOSE AEPB INHALE 1 DOSE BY MOUTH TWICE DAILY 60 each 0  . albuterol (VENTOLIN HFA) 108 (90 Base) MCG/ACT inhaler INHALE TWO PUFFS BY MOUTH EVERY 4 HOURS AS NEEDED 18 each 5  . aspirin 81 MG tablet Take 81 mg by mouth at bedtime.     [name]. atorvastatin (LIPITOR) 40 MG tablet Take 1 tablet (40 mg total) by mouth daily. 30 tablet 5  . clopidogrel (PLAVIX) 75 MG tablet Take 1 tablet (75 mg total) by mouth daily with breakfast. 30 tablet 5  . fexofenadine (ALLEGRA) 180 MG tablet Take 180 mg by mouth daily.    . fish oil-omega-3 fatty acids 1000 MG capsule Take 1 g by mouth 2 (two) times daily.     [name]. lisinopril-hydrochlorothiazide (ZESTORETIC) 20-12.5 MG tablet Take 1 tablet by mouth daily. 30 tablet 5  . metoprolol tartrate (LOPRESSOR) 25 MG tablet Take 0.5 tablets (12.5 mg total) by mouth 2 (two) times daily. 30 tablet 5  . mometasone (NASONEX) 50 MCG/ACT nasal spray Place 2 sprays into the nose daily as needed (for allergies). 17 g 5  . Multiple Vitamin (MULTIVITAMIN) tablet Take  1 tablet by mouth daily.    . nitroGLYCERIN (NITROSTAT) 0.4 MG SL tablet Place 1 tablet (0.4 mg total) under the tongue every 5 (five) minutes x 3 doses as needed for chest pain. 25 tablet 12  . tiotropium (SPIRIVA HANDIHALER) 18 MCG inhalation capsule Place 1 capsule (18 mcg total) into inhaler and inhale daily. 30 capsule 5  . vitamin C (ASCORBIC ACID) 500 MG tablet Take 500 mg by mouth daily. Reported on 07/16/2015     No current facility-administered  medications for this visit.     Past Medical History:  Diagnosis Date  . AAA (abdominal aortic aneurysm) (HCC)   . COPD (chronic obstructive pulmonary disease) (HCC)   . Fatty liver   . Hyperlipidemia   . Hypertension   . White coat hypertension     Past Surgical History:  Procedure Laterality Date  . ANKLE SURGERY Right 2009  . APPENDECTOMY    . CARDIAC CATHETERIZATION N/A 02/02/2016   Procedure: Left Heart Cath and Coronary Angiography;  Surgeon: Rinaldo Cloud, MD;  Location: [facility] INVASIVE CV LAB;  Service: Cardiovascular;  Laterality: N/A;  . COLONOSCOPY  06/22/2010   [id number] rectal and right colon polyps removed remainder rectum and colon appared normal  . COLONOSCOPY WITH PROPOFOL N/A 05/15/2014   Procedure: COLONOSCOPY WITH PROPOFOL;  Surgeon: Corbin Ade, MD;  Location: [facility] ORS;  Service: Endoscopy;  Laterality: N/A;  In cecum @ [id number], out @ 0937, withdrawal time 19 minutes  . POLYPECTOMY N/A 05/15/2014   Procedure: POLYPECTOMY;  Surgeon: Corbin Ade, MD;  Location: [facility] ORS;  Service: Endoscopy;  Laterality: N/A;    Social History   Social History  . Marital status: Married    Spouse name: N/A  . Number of children: N/A  . Years of education: N/A   Occupational History  . Not on file.   Social History Main Topics  . Smoking status: Former Smoker    Packs/day: 1.50    Years: 20.00    Types: Cigarettes    Quit date: 04/16/2002  . Smokeless tobacco: Never Used  . Alcohol use 3.0 oz/week    5 Cans of beer per week     Comment: per pt  . Drug use: No  . Sexual activity: No   Other Topics Concern  . Not on file   Social History Narrative  . No narrative on file     No family history of premature CAD in 1st degree relatives.  Current Meds  Medication Sig  . ADVAIR DISKUS 100-50 MCG/DOSE AEPB INHALE 1 DOSE BY MOUTH TWICE DAILY  . albuterol (VENTOLIN HFA) 108 (90 Base) MCG/ACT inhaler INHALE TWO PUFFS BY MOUTH EVERY 4 HOURS AS NEEDED  . aspirin 81 MG  tablet Take 81 mg by mouth at bedtime.   [name] atorvastatin (LIPITOR) 40 MG tablet Take 1 tablet (40 mg total) by mouth daily.  . clopidogrel (PLAVIX) 75 MG tablet Take 1 tablet (75 mg total) by mouth daily with breakfast.  . fexofenadine (ALLEGRA) 180 MG tablet Take 180 mg by mouth daily.  . fish oil-omega-3 fatty acids 1000 MG capsule Take 1 g by mouth 2 (two) times daily.   [name] lisinopril-hydrochlorothiazide (ZESTORETIC) 20-12.5 MG tablet Take 1 tablet by mouth daily.  . metoprolol tartrate (LOPRESSOR) 25 MG tablet Take 0.5 tablets (12.5 mg total) by mouth 2 (two) times daily.  . mometasone (NASONEX) 50 MCG/ACT nasal spray Place 2 sprays into the nose daily as needed (for allergies).  . Multiple  Vitamin (MULTIVITAMIN) tablet Take 1 tablet by mouth daily.  . nitroGLYCERIN (NITROSTAT) 0.4 MG SL tablet Place 1 tablet (0.4 mg total) under the tongue every 5 (five) minutes x 3 doses as needed for chest pain.  [name] tiotropium (SPIRIVA HANDIHALER) 18 MCG inhalation capsule Place 1 capsule (18 mcg total) into inhaler and inhale daily.  . vitamin C (ASCORBIC ACID) 500 MG tablet Take 500 mg by mouth daily. Reported on 07/16/2015      Review of systems complete and found to be negative unless listed above in HPI    Physical exam Blood pressure (!) 150/98, pulse 61, height 5\' 9"  (1.753 m), weight 190 lb (86.2 kg), SpO2 98 %. General: NAD Neck: No JVD, no thyromegaly or thyroid nodule.  Lungs: Diminished throughout, no crackles or wheezes. CV: Distant heart tones. Regular rate and rhythm, very difficult to appreciate valve sounds/murmur.  No peripheral edema.  No carotid bruit.    Abdomen: Soft, nontender, no distention.  Skin: Intact without lesions or rashes.  Neurologic: Alert and oriented x 3.  Psych: Normal affect. Extremities: No clubbing or cyanosis.  HEENT: Normal.   ECG: Most recent ECG reviewed.   Labs: Lab Results  Component Value Date/Time   K 4.9 05/16/2016 08:56 AM   BUN 11 05/16/2016  08:56 AM   BUN 9 05/18/2015 08:34 AM   CREATININE 0.73 05/16/2016 08:56 AM   ALT 19 05/16/2016 08:56 AM   HGB 15.7 04/14/2016 07:52 AM     Lipids: Lab Results  Component Value Date/Time   LDLCALC 56 05/16/2016 08:56 AM   LDLCALC 70 05/18/2015 08:34 AM   CHOL 136 05/16/2016 08:56 AM   CHOL 128 05/18/2015 08:34 AM   TRIG 225 (H) 05/16/2016 08:56 AM   HDL 35 (L) 05/16/2016 08:56 AM   HDL 43 05/18/2015 08:34 AM        ASSESSMENT AND PLAN:  1. Coronary artery disease with history of non-STEMI and nonobstructive disease: Coronary angiography details reviewed above. Continue aspirin, Lipitor, Plavix, ACEI, and metoprolol. Would recommend DAPT x 1 year given NSTEMI.  2. Severe aortic stenosis: Given the fact that he has significant COPD, it is difficult to say with certainty if he is symptomatic from this. He has had increasing shortness of breath over the past 12 months and has a prior history of tobacco abuse. He denies chest pain and has never been hospitalized for CHF. I will obtain an echocardiogram with contrast to further characterize the aortic valve, peak velocities, mean gradients, and valve area. I will then decide about the timing of CT surgery referral. I have explained the natural history of aortic stenosis with the patient and the ultimate need for valve replacement.  3. Hypertension: Elevated today. This will need to be monitored as he may require an adjustment in therapy.  4. Hyperlipidemia: Lipids reviewed above. Continue Lipitor 40 mg.  5. Abdominal aortic aneurysm: Most recent ultrasound detailed above. Follows with vascular surgery. Aim to control blood pressure.  Disposition: Follow up in 1 month  Signed: Prentice Docker, M.[name]., F.A.C.C.  06/17/2016, 8:46 AM

## 2016-06-17 NOTE — Patient Instructions (Signed)
Your physician recommends that you schedule a follow-up appointment in: 1 MONTH WITH DR Purvis SheffieldKONESWARAN  Your physician recommends that you continue on your current medications as directed. Please refer to the Current Medication list given to you today.  Your physician has requested that you have an echocardiogram. Echocardiography is a painless test that uses sound waves to create images of your heart. It provides your doctor with information about the size and shape of your heart and how well your heart's chambers and valves are working. This procedure takes approximately one hour. There are no restrictions for this procedure.  Thank you for choosing [facility]!!

## 2016-06-22 ENCOUNTER — Ambulatory Visit (HOSPITAL_COMMUNITY)
Admission: RE | Admit: 2016-06-22 | Discharge: 2016-06-22 | Disposition: A | Discharge status: Home / Self Care | Payer: BLUE CROSS/BLUE SHIELD | Source: Ambulatory Visit | Attending: Cardiovascular Disease | Admitting: Cardiovascular Disease

## 2016-06-22 DIAGNOSIS — E785 Hyperlipidemia, unspecified: Secondary | ICD-10-CM | POA: Insufficient documentation

## 2016-06-22 DIAGNOSIS — I350 Nonrheumatic aortic (valve) stenosis: Secondary | ICD-10-CM

## 2016-06-22 DIAGNOSIS — I352 Nonrheumatic aortic (valve) stenosis with insufficiency: Secondary | ICD-10-CM | POA: Diagnosis not present

## 2016-06-22 DIAGNOSIS — Z87891 Personal history of nicotine dependence: Secondary | ICD-10-CM | POA: Diagnosis not present

## 2016-06-22 DIAGNOSIS — J449 Chronic obstructive pulmonary disease, unspecified: Secondary | ICD-10-CM | POA: Diagnosis not present

## 2016-06-22 DIAGNOSIS — I361 Nonrheumatic tricuspid (valve) insufficiency: Secondary | ICD-10-CM | POA: Insufficient documentation

## 2016-06-22 DIAGNOSIS — I348 Other nonrheumatic mitral valve disorders: Secondary | ICD-10-CM | POA: Diagnosis not present

## 2016-06-22 DIAGNOSIS — I515 Myocardial degeneration: Secondary | ICD-10-CM | POA: Insufficient documentation

## 2016-06-22 DIAGNOSIS — I517 Cardiomegaly: Secondary | ICD-10-CM | POA: Diagnosis not present

## 2016-06-22 DIAGNOSIS — I252 Old myocardial infarction: Secondary | ICD-10-CM | POA: Diagnosis not present

## 2016-06-22 NOTE — Progress Notes (Signed)
*  PRELIMINARY RESULTS* Echocardiogram 2D Echocardiogram has been performed.  Stacey DrainWhite, Barbi Kumagai J 06/22/2016, 12:59 PM

## 2016-06-24 ENCOUNTER — Telehealth: Payer: Self-pay | Admitting: *Deleted

## 2016-06-24 NOTE — Telephone Encounter (Signed)
Notes recorded by Lesle ChrisHill, Angela G, LPN on 8/11/91475/18/2018 at 5:17 PM EDT Patient notified. Copy to pmd. Follow up scheduled for 08/01/2016 with Dr. Purvis SheffieldKoneswaran. ------  Notes recorded by Laqueta LindenKoneswaran, Suresh A, MD on 06/23/2016 at 10:13 AM EDT Normal cardiac function with severe calcium buildup and narrowing of aortic valve. I will discuss at fu visit.

## 2016-07-18 ENCOUNTER — Encounter: Payer: Self-pay | Admitting: Family

## 2016-07-28 ENCOUNTER — Ambulatory Visit (HOSPITAL_COMMUNITY)
Admission: RE | Admit: 2016-07-28 | Discharge: 2016-07-28 | Disposition: A | Discharge status: Home / Self Care | Payer: BLUE CROSS/BLUE SHIELD | Source: Ambulatory Visit | Attending: Vascular Surgery | Admitting: Vascular Surgery

## 2016-07-28 ENCOUNTER — Encounter: Payer: Self-pay | Admitting: Family

## 2016-07-28 ENCOUNTER — Ambulatory Visit (INDEPENDENT_AMBULATORY_CARE_PROVIDER_SITE_OTHER): Payer: BLUE CROSS/BLUE SHIELD | Admitting: Family

## 2016-07-28 VITALS — BP 111/78 | HR 63 | Temp 97.9°F | Resp 16 | Ht 69.0 in | Wt 188.0 lb

## 2016-07-28 DIAGNOSIS — I714 Abdominal aortic aneurysm, without rupture, unspecified: Secondary | ICD-10-CM

## 2016-07-28 DIAGNOSIS — Z87891 Personal history of nicotine dependence: Secondary | ICD-10-CM

## 2016-07-28 NOTE — Patient Instructions (Addendum)
Before your next abdominal ultrasound:  Take two Extra-Strength Gas-X capsules at bedtime the night before the test. Take another two Extra-Strength Gas-X capsules 3 hours before the test.  Avoid gas forming foods the day before the test.       Abdominal Aortic Aneurysm Blood pumps away from the heart through tubes (blood vessels) called arteries. Aneurysms are weak or damaged places in the wall of an artery. It bulges out like a balloon. An abdominal aortic aneurysm happens in the main artery of the body (aorta). It can burst or tear, causing bleeding inside the body. This is an emergency. It needs treatment right away. What are the causes? The exact cause is unknown. Things that could cause this problem include:  Fat and other substances building up in the lining of a tube.  Swelling of the walls of a blood vessel.  Certain tissue diseases.  Belly (abdominal) trauma.  An infection in the main artery of the body.  What increases the risk? There are things that make it more likely for you to have an aneurysm. These include:  Being over the age of 64 years old.  Having high blood pressure (hypertension).  Being a male.  Being white.  Being very overweight (obese).  Having a family history of aneurysm.  Using tobacco products.  What are the signs or symptoms? Symptoms depend on the size of the aneurysm and how fast it grows. There may not be symptoms. If symptoms occur, they can include:  Pain (belly, side, lower back, or groin).  Feeling full after eating a small amount of food.  Feeling sick to your stomach (nauseous), throwing up (vomiting), or both.  Feeling a lump in your belly that feels like it is beating (pulsating).  Feeling like you will pass out (faint).  How is this treated?  Medicine to control blood pressure and pain.  Imaging tests to see if the aneurysm gets bigger.  Surgery. How is this prevented? To lessen your chance of getting this  condition:  Stop smoking. Stop chewing tobacco.  Limit or avoid alcohol.  Keep your blood pressure, blood sugar, and cholesterol within normal limits.  Eat less salt.  Eat foods low in saturated fats and cholesterol. These are found in animal and whole dairy products.  Eat more fiber. Fiber is found in whole grains, vegetables, and fruits.  Keep a healthy weight.  Stay active and exercise often.  This information is not intended to replace advice given to you by your health care provider. Make sure you discuss any questions you have with your health care provider. Document Released: 05/21/2012 Document Revised: 07/02/2015 Document Reviewed: 02/24/2012 Elsevier Interactive Patient Education  2017 Elsevier Inc.  

## 2016-07-28 NOTE — Progress Notes (Signed)
[facility] [place]   CC: Follow up Abdominal Aortic Aneurysm  History of Present Illness  [name] is a 66 y.o. (Oct 04, 1950) male patient of Dr. Darrick PennaFields who presents for evaluation of abdominal aortic aneurysm.The patient has been asymptomatic. His aneurysm was discovered on a routine ultrasound for evaluation of elevated liver function tests. His family history is remarkable for his brother who had an abdominal aortic aneurysm repaired at age 64. Other medical problems include COPD, hyperlipidemia, hypertension all of which are currently stable. He is a former smoker and quit 2004. He does have COPD but does not require home oxygen and can climb more than 2 flights of stairs without becoming short of breath.  He returns today for follow-up. His aneurysm was 4.7 cm in diameter on previous CT scan on 01-12-15 while ultrasound at that time showed it to be 5.3 cm.  He denies any history of stroke, denies claudication sx's with walking.  Creatinine was 0.64 on 11-19-15   He had an MI in December 2017, had a diagnostic cardiac cath, found severe aortic valve stenosis; this is pending to repair; seeing Dr. Darl HouseholderKoneswaren.   Pt Diabetic: No Pt smoker: former smoker, quit in 2014, smoked for 15 years   Past Medical History:  Diagnosis Date  . AAA (abdominal aortic aneurysm) (HCC)   . COPD (chronic obstructive pulmonary disease) (HCC)   . Fatty liver   . Hyperlipidemia   . Hypertension   . White coat hypertension    Past Surgical History:  Procedure Laterality Date  . ANKLE SURGERY Right 2009  . APPENDECTOMY    . CARDIAC CATHETERIZATION N/A 02/02/2016   Procedure: Left Heart Cath and Coronary Angiography;  Surgeon: Rinaldo CloudMohan Harwani, MD;  [facility];  Service: Cardiovascular;  Laterality: N/A;  . COLONOSCOPY  06/22/2010   VOZ:[id number]:Diminutive rectal and right colon polyps removed remainder rectum and colon appared normal  . COLONOSCOPY WITH  PROPOFOL N/A 05/15/2014   Procedure: COLONOSCOPY WITH PROPOFOL;  Surgeon: Corbin Adeobert M Rourk, MD;  Location: [facility];  Service: Endoscopy;  Laterality: N/A;  In cecum @ [id number], out @ 0937, withdrawal time 19 minutes  . POLYPECTOMY N/A 05/15/2014   Procedure: POLYPECTOMY;  Surgeon: Corbin Adeobert M Rourk, MD;  Location: [facility];  Service: Endoscopy;  Laterality: N/A;   Social History Social History   Social History  . Marital status: Married    Spouse name: N/A  . Number of children: N/A  . Years of education: N/A   Occupational History  . Not on file.   Social History Main Topics  . Smoking status: Former Smoker    Packs/day: 1.50    Years: 20.00    Types: Cigarettes    Quit date: 04/16/2002  . Smokeless tobacco: Never Used  . Alcohol use 3.0 oz/week    5 Cans of beer per week     Comment: per pt  . Drug use: No  . Sexual activity: No   Other Topics Concern  . Not on file   Social History Narrative  . No narrative on file   Family History Family History  Problem Relation Age of Onset  . Hypertension Sister   . AAA (abdominal aortic aneurysm) Brother   . Colon cancer Neg Hx   . Liver disease Neg Hx     Current Outpatient Prescriptions on File Prior to Visit  Medication Sig Dispense Refill  . ADVAIR DISKUS 100-50 MCG/DOSE AEPB INHALE 1 DOSE BY MOUTH TWICE DAILY  60 each 0  . albuterol (VENTOLIN HFA) 108 (90 Base) MCG/ACT inhaler INHALE TWO PUFFS BY MOUTH EVERY 4 HOURS AS NEEDED 18 each 5  . aspirin 81 MG tablet Take 81 mg by mouth at bedtime.     [name] atorvastatin (LIPITOR) 40 MG tablet Take 1 tablet (40 mg total) by mouth daily. 30 tablet 5  . clopidogrel (PLAVIX) 75 MG tablet Take 1 tablet (75 mg total) by mouth daily with breakfast. 30 tablet 5  . fexofenadine (ALLEGRA) 180 MG tablet Take 180 mg by mouth daily.    . fish oil-omega-3 fatty acids 1000 MG capsule Take 1 g by mouth 2 (two) times daily.     [name] lisinopril-hydrochlorothiazide (ZESTORETIC) 20-12.5 MG tablet Take 1 tablet by mouth  daily. 30 tablet 5  . metoprolol tartrate (LOPRESSOR) 25 MG tablet Take 0.5 tablets (12.5 mg total) by mouth 2 (two) times daily. 30 tablet 5  . mometasone (NASONEX) 50 MCG/ACT nasal spray Place 2 sprays into the nose daily as needed (for allergies). 17 g 5  . Multiple Vitamin (MULTIVITAMIN) tablet Take 1 tablet by mouth daily.    . nitroGLYCERIN (NITROSTAT) 0.4 MG SL tablet Place 1 tablet (0.4 mg total) under the tongue every 5 (five) minutes x 3 doses as needed for chest pain. 25 tablet 12  . tiotropium (SPIRIVA HANDIHALER) 18 MCG inhalation capsule Place 1 capsule (18 mcg total) into inhaler and inhale daily. 30 capsule 5  . vitamin C (ASCORBIC ACID) 500 MG tablet Take 500 mg by mouth daily. Reported on 07/16/2015     No current facility-administered medications on file prior to visit.    No Known Allergies  ROS: See HPI for pertinent positives and negatives.  Physical Examination  Vitals:   07/28/16 0833  BP: 111/78  Pulse: 63  Resp: 16  Temp: 97.9 F (36.6 C)  TempSrc: Oral  SpO2: 94%  Weight: 188 lb (85.3 kg)  Height: 5\' 9"  (1.753 m)   Body mass index is 27.76 kg/m.  General: A&O x 3, WD, male in NAD.  Pulmonary: Sym exp, respirations are non labored, limited air movement in all fields, CTAB, no rales, rhonchi, or wheezing.  Cardiac: Regular rate and rhythm, muffled heart sounds   Carotid Bruits Right Left   Negative Negative   Aorta is not palpable Radial pulses are 2+ palpable                          VASCULAR EXAM:                                                                                                                                           LE Pulses Right Left       FEMORAL  1+ palpable  1+ palpable        POPLITEAL  not palpable   not  palpable       POSTERIOR TIBIAL  not palpable   not palpable        DORSALIS PEDIS      ANTERIOR TIBIAL faintly palpable  faintly palpable      Gastrointestinal: soft, NTND, -G/R, - HSM, -  masses palpated, - CVAT B.  Musculoskeletal: M/S 5/5 throughout, Extremities without ischemic changes.  Neurologic: CN 2-12 intact, Pain and light touch intact in extremities are intact, Motor exam as listed above   01-12-15 CTA abd/pelvis: Vascular Findings:  Abdominal aorta: There is a moderate to large amount of eccentric mixed calcified and noncalcified atherosclerotic plaque throughout the abdominal aorta, not resulting in a hemodynamically significant stenosis. There is a moderate to large sized infrarenal abdominal aortic aneurysm which measures approximately 4.7 x 4.7 x 4.6 cm as measured in greatest oblique short axis axial (image 178, series 4), sagittal (image 88, series 604) and coronal (image 90, series 602) dimensions respectively. The aneurysm the dominant component of the aneurysm originates approximately 6.2 cm caudal to the take-off of the most inferior left renal artery and extends to the level of the aortic bifurcation though does not extend into either common carotid artery. No periaortic stranding. Celiac artery: There is a minimal amount of eccentric mixed calcified and noncalcified atherosclerotic plaque involving the cranial aspect of the origin the celiac artery, not resulting in hemodynamically significant stenosis. Conventional branching pattern. SMA: There is a minimal amount of eccentric mixed calcified and noncalcified atherosclerotic plaque involving the cranial aspect of the origin the SMA, not resulting in hemodynamically significant stenosis. Conventional branching pattern. The distal tributaries the SMA are widely patent without discrete intraluminal filling defect to suggest distal embolism. Right Renal artery: Solitary; there is a minimal amount of eccentric mixed calcified and noncalcified atherosclerotic plaque involving the origin of the right renal artery, not resulting in hemodynamically significant stenosis. No vessel  irregularity to suggest FMD. Left Renal artery: Solitary; there is eccentric probably calcified atherosclerotic plaque involving the origin of the left renal artery, not resulting in a hemodynamically significant stenosis. No vessel irregularity to suggest FMD. IMA: Occluded at its origin with early reconstitution via collateral supply from the SMA. Right-sided pelvic vasculature: There is a moderate amount of eccentric mixed calcified and noncalcified atherosclerotic plaque throughout the right common and external iliac arteries as well as the right common femoral artery, not definitely resulting in hemodynamically significant stenosis. The right internal iliac artery is diseased though patent of normal caliber. Left-sided pelvic vasculature: There is a moderate to large amount of extend mixed calcified and noncalcified atherosclerotic plaque throughout the left common iliac artery, not resulting in hemodynamically significant stenosis. There is eccentric predominantly calcified atherosclerotic plaque involving the proximal aspect of the left external iliac artery which likely results in at least 50% luminal narrowing (image 207, series 4). There is eccentric mixed calcified and noncalcified atherosclerotic plaque which extends to involve the origin of the left superficial femoral artery and likely results in at least 50% luminal narrowing at this location (image 238, series 4). The left internal iliac artery is diseased though patent and of normal caliber.   Non-Invasive Vascular Imaging  AAA Duplex (07/28/2016)  Previous size: 4.7 cm cm (Date: 01-28-16)  Current size:  4.7 cm (Date: 07/28/16); Right CIA: 1.21 cm; Left CIA: not visualized.  Limited exam due to overlying bowel gas.   Medical Decision Making  The patient is a 66 y.o. male who presents with asymptomatic AAA with no increase in size,  based on limited visualization.  December 2016 CTA AAA measured 4.7 cm.     Based on this patient's exam and diagnostic studies, the patient will follow up in 6 months  with the following studies: AAA duplex.  Consideration for repair of AAA would be made when the size is 5.0 cm, growth > 1 cm/yr, and symptomatic status.        The patient was given information about AAA including signs, symptoms, treatment, and how to minimize the risk of enlargement and rupture of aneurysms.    I emphasized the importance of maximal medical management including strict control of blood pressure, blood glucose, and lipid levels, antiplatelet agents, obtaining regular exercise, and continued cessation of smoking.   The patient was advised to call 911 should the patient experience sudden onset abdominal or back pain.   Thank you for allowing [place] to participate in this patient's care.  Charisse March, RN, MSN, FNP-C [facility] [place] Office: [phone]  Clinic Physician: Myra Gianotti on call  07/28/2016, 8:58 AM

## 2016-07-29 NOTE — Addendum Note (Signed)
Addended by: Burton ApleyPETTY, Kanoelani Dobies A on: 07/29/2016 10:24 AM   Modules accepted: Orders

## 2016-08-01 ENCOUNTER — Ambulatory Visit (INDEPENDENT_AMBULATORY_CARE_PROVIDER_SITE_OTHER): Payer: BLUE CROSS/BLUE SHIELD | Admitting: Cardiovascular Disease

## 2016-08-01 ENCOUNTER — Ambulatory Visit: Payer: BLUE CROSS/BLUE SHIELD | Admitting: Cardiovascular Disease

## 2016-08-01 ENCOUNTER — Encounter: Payer: Self-pay | Admitting: Cardiovascular Disease

## 2016-08-01 VITALS — BP 107/66 | HR 62 | Ht 69.0 in | Wt 193.0 lb

## 2016-08-01 DIAGNOSIS — I714 Abdominal aortic aneurysm, without rupture, unspecified: Secondary | ICD-10-CM

## 2016-08-01 DIAGNOSIS — Z01812 Encounter for preprocedural laboratory examination: Secondary | ICD-10-CM | POA: Diagnosis not present

## 2016-08-01 DIAGNOSIS — Q230 Congenital stenosis of aortic valve: Secondary | ICD-10-CM

## 2016-08-01 DIAGNOSIS — I350 Nonrheumatic aortic (valve) stenosis: Secondary | ICD-10-CM

## 2016-08-01 DIAGNOSIS — I10 Essential (primary) hypertension: Secondary | ICD-10-CM

## 2016-08-01 DIAGNOSIS — I25118 Atherosclerotic heart disease of native coronary artery with other forms of angina pectoris: Secondary | ICD-10-CM

## 2016-08-01 DIAGNOSIS — I252 Old myocardial infarction: Secondary | ICD-10-CM

## 2016-08-01 DIAGNOSIS — E782 Mixed hyperlipidemia: Secondary | ICD-10-CM | POA: Diagnosis not present

## 2016-08-01 DIAGNOSIS — Q231 Congenital insufficiency of aortic valve: Secondary | ICD-10-CM

## 2016-08-01 NOTE — Patient Instructions (Signed)
Medication Instructions:  Continue all current medications.  Labwork: BMET - order given today.  Testing/Procedures:  CTA of chest & abdomen   Office will contact with results via phone or letter.    Follow-Up: Your physician wants you to follow up in: 6 months.  You will receive a reminder letter in the mail one-two months in advance.  If you don't receive a letter, please call our office to schedule the follow up appointment   Any Other Special Instructions Will Be Listed Below (If Applicable). You have been referred to:  [facility]   If you need a refill on your cardiac medications before your next appointment, please call your pharmacy.

## 2016-08-01 NOTE — Progress Notes (Signed)
SUBJECTIVE: The patient presents for follow-up of severe aortic stenosis and coronary artery disease.  Echocardiogram 06/22/16 demonstrated normal left ventricular systolic function and diastolic function, LVEF 60-65%, severe aortic stenosis and mild aortic regurgitation. It may be bicuspid. Mean gradient 48 mmHg, valve area 0.9 cm.  He underwent coronary angiography on 02/02/16 which demonstrated the following:  Prox RCA lesion, 40 %stenosed.  Mid RCA lesion, 30 %stenosed.  Ost Cx to Prox Cx lesion, 40 %stenosed.  Prox LAD lesion, 20 %stenosed.  Mid LAD lesion, 40 %stenosed.   He also has COPD, hypertension, and hyperlipidemia.  He walks about 1.5 miles 4-5 times a week with his wife. His wife, Jola Babinski, is at today's visit. He reports stopping after about one third of a mile to catch his breath. He denies syncope.  He has been noticing sporadic chest pain when walking to the mailbox or when lifting suitcases last week. He said he had fairly significant chest pain after lifting suitcases. The chest pain is sporadic in the sense that it occasionally occurs with exertion but this is not consistent.  Abdominal aortic ultrasound on 07/28/16 demonstrated AAA 4.7 x 4.7 cm in diameter.    Review of Systems: As per "subjective", otherwise negative.  No Known Allergies  Current Outpatient Prescriptions  Medication Sig Dispense Refill  . ADVAIR DISKUS 100-50 MCG/DOSE AEPB INHALE 1 DOSE BY MOUTH TWICE DAILY 60 each 0  . albuterol (VENTOLIN HFA) 108 (90 Base) MCG/ACT inhaler INHALE TWO PUFFS BY MOUTH EVERY 4 HOURS AS NEEDED 18 each 5  . aspirin 81 MG tablet Take 81 mg by mouth at bedtime.     [name] atorvastatin (LIPITOR) 40 MG tablet Take 1 tablet (40 mg total) by mouth daily. 30 tablet 5  . clopidogrel (PLAVIX) 75 MG tablet Take 1 tablet (75 mg total) by mouth daily with breakfast. 30 tablet 5  . fexofenadine (ALLEGRA) 180 MG tablet Take 180 mg by mouth daily.    . fish oil-omega-3  fatty acids 1000 MG capsule Take 1 g by mouth 2 (two) times daily.     [name] lisinopril-hydrochlorothiazide (ZESTORETIC) 20-12.5 MG tablet Take 1 tablet by mouth daily. 30 tablet 5  . metoprolol tartrate (LOPRESSOR) 25 MG tablet Take 0.5 tablets (12.5 mg total) by mouth 2 (two) times daily. 30 tablet 5  . mometasone (NASONEX) 50 MCG/ACT nasal spray Place 2 sprays into the nose daily as needed (for allergies). 17 g 5  . Multiple Vitamin (MULTIVITAMIN) tablet Take 1 tablet by mouth daily.    . nitroGLYCERIN (NITROSTAT) 0.4 MG SL tablet Place 1 tablet (0.4 mg total) under the tongue every 5 (five) minutes x 3 doses as needed for chest pain. 25 tablet 12  . tiotropium (SPIRIVA HANDIHALER) 18 MCG inhalation capsule Place 1 capsule (18 mcg total) into inhaler and inhale daily. 30 capsule 5  . vitamin C (ASCORBIC ACID) 500 MG tablet Take 500 mg by mouth daily. Reported on 07/16/2015     No current facility-administered medications for this visit.     Past Medical History:  Diagnosis Date  . AAA (abdominal aortic aneurysm) (HCC)   . COPD (chronic obstructive pulmonary disease) (HCC)   . Fatty liver   . Hyperlipidemia   . Hypertension   . White coat hypertension     Past Surgical History:  Procedure Laterality Date  . ANKLE SURGERY Right 2009  . APPENDECTOMY    . CARDIAC CATHETERIZATION N/A 02/02/2016   Procedure: Left Heart Cath and  Coronary Angiography;  Surgeon: Rinaldo Cloud, MD;  Location: [facility] INVASIVE CV LAB;  Service: Cardiovascular;  Laterality: N/A;  . COLONOSCOPY  06/22/2010   [id number] rectal and right colon polyps removed remainder rectum and colon appared normal  . COLONOSCOPY WITH PROPOFOL N/A 05/15/2014   Procedure: COLONOSCOPY WITH PROPOFOL;  Surgeon: Corbin Ade, MD;  Location: [facility];  Service: Endoscopy;  Laterality: N/A;  In cecum @ [id number], out @ 0937, withdrawal time 19 minutes  . POLYPECTOMY N/A 05/15/2014   Procedure: POLYPECTOMY;  Surgeon: Corbin Ade, MD;  Location: [facility];  Service: Endoscopy;  Laterality: N/A;    Social History   Social History  . Marital status: Married    Spouse name: N/A  . Number of children: N/A  . Years of education: N/A   Occupational History  . Not on file.   Social History Main Topics  . Smoking status: Former Smoker    Packs/day: 1.50    Years: 20.00    Types: Cigarettes    Quit date: 04/16/2002  . Smokeless tobacco: Never Used  . Alcohol use 3.0 oz/week    5 Cans of beer per week     Comment: per pt  . Drug use: No  . Sexual activity: No   Other Topics Concern  . Not on file   Social History Narrative  . No narrative on file     Vitals:   08/01/16 1335  BP: 107/66  Pulse: 62  SpO2: 93%  Weight: 193 lb (87.5 kg)  Height: 5\' 9"  (1.753 m)    Wt Readings from Last 3 Encounters:  08/01/16 193 lb (87.5 kg)  07/28/16 188 lb (85.3 kg)  06/17/16 190 lb (86.2 kg)     PHYSICAL EXAM General: NAD HEENT: Normal. Neck: No JVD, no thyromegaly. Lungs: Diminished throughout, no rales or wheezes. CV: Nondisplaced PMI.  Regular rate and rhythm, normal S1/S2, no S3/S4, 2/6 systolic murmur over RUSB. No pretibial or periankle edema.  No carotid bruit.   Abdomen: Soft, nontender, no distention.  Neurologic: Alert and oriented.  Psych: Normal affect. Skin: Normal. Musculoskeletal: No gross deformities.    ECG: Most recent ECG reviewed.   Labs: Lab Results  Component Value Date/Time   K 4.9 05/16/2016 08:56 AM   BUN 11 05/16/2016 08:56 AM   BUN 9 05/18/2015 08:34 AM   CREATININE 0.73 05/16/2016 08:56 AM   ALT 19 05/16/2016 08:56 AM   HGB 15.7 04/14/2016 07:52 AM   HGB 15.8 06/13/2015 09:06 AM     Lipids: Lab Results  Component Value Date/Time   LDLCALC 56 05/16/2016 08:56 AM   LDLCALC 70 05/18/2015 08:34 AM   CHOL 136 05/16/2016 08:56 AM   CHOL 128 05/18/2015 08:34 AM   TRIG 225 (H) 05/16/2016 08:56 AM   HDL 35 (L) 05/16/2016 08:56 AM   HDL 43 05/18/2015 08:34 AM       ASSESSMENT AND  PLAN: 1. Coronary artery disease with history of non-STEMI and nonobstructive disease: Symptomatically stable. Coronary angiography details reviewed above. Continue aspirin, Lipitor, Plavix, ACEI, and metoprolol. Would recommend DAPT x 1 year given NSTEMI.  2. Severe symptomatic aortic stenosis: Most recent echocardiogram results detailed above demonstrating severe aortic stenosis and increase of mean gradient from 38 to 48 mmHg. He now appears to have demonstrated symptoms with respect to chest pain. Although it is sporadic, I am concerned that this is reflective of his disease process. I will make a referral to CT surgery to determine  method and timing of aortic valve replacement.  Given that the valve appears bicuspid, I will obtain a CTA of the aorta to evaluate for aortopathy. I will extend into the abdomen as per vascular surgery's request.  3. Hypertension: Controlled. No changes.  4. Hyperlipidemia: Continue Lipitor 40 mg.  5. Abdominal aortic aneurysm: Follows with vascular surgery. Blood pressure is controlled. Most recent ultrasound on 07/28/16 detailed above. As mentioned above, I will obtain a CT to assess this as per vascular surgery's request.   Disposition: Follow up with me in 6 months. FU with CT surgery in near future.  Prentice DockerSuresh Koneswaran, M.D., F.A.C.C.

## 2016-08-05 ENCOUNTER — Other Ambulatory Visit: Payer: Self-pay | Admitting: Cardiovascular Disease

## 2016-08-05 ENCOUNTER — Telehealth: Payer: Self-pay | Admitting: *Deleted

## 2016-08-05 LAB — BASIC METABOLIC PANEL
BUN: 14 mg/dL (ref 7–25)
CO2: 27 mmol/L (ref 20–31)
Calcium: 9.6 mg/dL (ref 8.6–10.3)
Chloride: 100 mmol/L (ref 98–110)
Creat: 0.72 mg/dL (ref 0.70–1.25)
Glucose, Bld: 96 mg/dL (ref 65–99)
Potassium: 4.4 mmol/L (ref 3.5–5.3)
Sodium: 136 mmol/L (ref 135–146)

## 2016-08-05 NOTE — Telephone Encounter (Signed)
Notes recorded by Lesle ChrisHill, Marijane Trower G, LPN on 1/61/09606/29/2018 at 5:38 PM EDT Patient notified. Copy to pmd. Labs done prior to CT scheduled on 08/12/2016. ------  Notes recorded by Laqueta LindenKoneswaran, Suresh A, MD on 08/05/2016 at 4:23 PM EDT Normal.

## 2016-08-09 ENCOUNTER — Telehealth: Payer: Self-pay

## 2016-08-09 ENCOUNTER — Encounter: Payer: BLUE CROSS/BLUE SHIELD | Admitting: Cardiothoracic Surgery

## 2016-08-09 NOTE — Telephone Encounter (Signed)
Pre-cert Verification for the following procedure   CTA CHEST scheduled for 08/12/16

## 2016-08-12 ENCOUNTER — Ambulatory Visit (HOSPITAL_COMMUNITY)
Admission: RE | Admit: 2016-08-12 | Discharge: 2016-08-12 | Disposition: A | Discharge status: Home / Self Care | Payer: BLUE CROSS/BLUE SHIELD | Source: Ambulatory Visit | Attending: Cardiovascular Disease | Admitting: Cardiovascular Disease

## 2016-08-12 ENCOUNTER — Encounter: Payer: BLUE CROSS/BLUE SHIELD | Admitting: Cardiothoracic Surgery

## 2016-08-12 DIAGNOSIS — J984 Other disorders of lung: Secondary | ICD-10-CM | POA: Insufficient documentation

## 2016-08-12 DIAGNOSIS — I714 Abdominal aortic aneurysm, without rupture, unspecified: Secondary | ICD-10-CM

## 2016-08-12 DIAGNOSIS — Q231 Congenital insufficiency of aortic valve: Secondary | ICD-10-CM | POA: Diagnosis present

## 2016-08-12 DIAGNOSIS — I700 Atherosclerosis of aorta: Secondary | ICD-10-CM | POA: Diagnosis not present

## 2016-08-12 DIAGNOSIS — Z01810 Encounter for preprocedural cardiovascular examination: Secondary | ICD-10-CM | POA: Insufficient documentation

## 2016-08-12 DIAGNOSIS — Q230 Congenital stenosis of aortic valve: Secondary | ICD-10-CM

## 2016-08-12 DIAGNOSIS — I2510 Atherosclerotic heart disease of native coronary artery without angina pectoris: Secondary | ICD-10-CM | POA: Diagnosis not present

## 2016-08-12 DIAGNOSIS — Z01812 Encounter for preprocedural laboratory examination: Secondary | ICD-10-CM

## 2016-08-12 MED ORDER — IOPAMIDOL (ISOVUE-370) INJECTION 76%
100.0000 mL | Freq: Once | INTRAVENOUS | Status: AC | PRN
  Administered 2016-08-12: 100 mL via INTRAVENOUS

## 2016-08-15 ENCOUNTER — Encounter: Payer: Self-pay | Admitting: Cardiothoracic Surgery

## 2016-08-15 ENCOUNTER — Institutional Professional Consult (permissible substitution) (INDEPENDENT_AMBULATORY_CARE_PROVIDER_SITE_OTHER): Payer: BLUE CROSS/BLUE SHIELD | Admitting: Cardiothoracic Surgery

## 2016-08-15 VITALS — BP 140/87 | HR 75 | Resp 20 | Ht 69.0 in | Wt 190.0 lb

## 2016-08-15 DIAGNOSIS — I350 Nonrheumatic aortic (valve) stenosis: Secondary | ICD-10-CM | POA: Diagnosis not present

## 2016-08-15 NOTE — Progress Notes (Signed)
[address]       [place] [address]             [phone]                    [name] [facility] Medical Record #[id number] Date of Birth: 06/06/1950  Referring: Daniel Linden, MD Primary Care: Daniel Sciara, MD  Chief Complaint:    Chief Complaint  Patient presents with  . Aortic Stenosis    Surgical eval, ECHO 06/22/16, CTA Chest 08/12/16, Cardiac Cath 01/23/16    History of Present Illness:    [name] 66 y.o. male is seen in the office  today for Evaluation of aortic stenosis. In late December 2017 the patient went to the emergency room with pneumonia symptoms, cough shortness of breath and chest discomfort. Initially went [facility] and when troponins were elevated to 1.640 [facility] and underwent cardiac catheterization. On exam and echocardiogram he was noted to have critical aortic stenosis. In addition he has a 4.7 cm abdominal aortic aneurysm last measured on 07/28/2016 ultrasound.  The patient comes in now noting increasing symptoms he notes increasing chest discomfort and shortness of breath with exertion over the past several months, he denies syncope or near-syncope.  He has a previous smoker but quit 15 years ago   Current Activity/ Functional Status:  Patient is independent with mobility/ambulation, transfers, ADL's, IADL's.   Zubrod Score: At the time of surgery this patient's most appropriate activity status/level should be described as: []     0    Normal activity, no symptoms [x]     1    Restricted in physical strenuous activity but ambulatory, able to do out light work []     2    Ambulatory and capable of self care, unable to do work activities, up and about               >50 % of waking hours                              []     3    Only limited self care, in bed greater than 50% of waking hours []     4    Completely disabled, no self care, confined to bed or chair []     5     Moribund   Past Medical History:  Diagnosis Date  . AAA (abdominal aortic aneurysm) (HCC)   . COPD (chronic obstructive pulmonary disease) (HCC)   . Fatty liver   . Hyperlipidemia   . Hypertension   . White coat hypertension     Past Surgical History:  Procedure Laterality Date  . ANKLE SURGERY Right 2009  . APPENDECTOMY    . CARDIAC CATHETERIZATION N/A 02/02/2016   Procedure: Left Heart Cath and Coronary Angiography;  Surgeon: Daniel Cloud, MD;  Location[facility] INVASIVE CV LAB;  Service: Cardiovascular;  Laterality: N/A;  . COLONOSCOPY  06/22/2010   QMV:HQIONGEXBM rectal and right colon polyps removed remainder rectum and colon appared normal  . COLONOSCOPY WITH PROPOFOL N/A 05/15/2014   Procedure: COLONOSCOPY WITH PROPOFOL;  Surgeon: Daniel Ade, MD;  Location: [facility];  Service: Endoscopy;  Laterality: N/A;  In cecum @ [id number], out @ 0937, withdrawal time 19 minutes  . POLYPECTOMY N/A 05/15/2014   Procedure: POLYPECTOMY;  Surgeon: Daniel Ade, MD;  Location: [facility];  Service: Endoscopy;  Laterality: N/A;    Family History  Problem Relation Age of Onset  . Hypertension Sister   . AAA (abdominal aortic aneurysm) Brother   . Colon cancer Neg Hx   . Liver disease Neg Hx     Social History   Social History  . Marital status: Married    Spouse name: N/A  . Number of children: N/A  . Years of education: N/A   Occupational History  . Not on file.   Social History Main Topics  . Smoking status: Former Smoker    Packs/day: 1.50    Years: 20.00    Types: Cigarettes    Quit date: 04/16/2002  . Smokeless tobacco: Never Used  . Alcohol use 3.0 oz/week    5 Cans of beer per week     Comment: per pt  . Drug use: No  . Sexual activity: No   Other Topics Concern  . Not on file   Social History Narrative  . No narrative on file    History  Smoking Status  . Former Smoker  . Packs/day: 1.50  . Years: 20.00  . Types: Cigarettes  . Quit date: 04/16/2002  Smokeless Tobacco   . Never Used    History  Alcohol Use  . 3.0 oz/week  . 5 Cans of beer per week    Comment: per pt     No Known Allergies  Current Outpatient Prescriptions  Medication Sig Dispense Refill  . ADVAIR DISKUS 100-50 MCG/DOSE AEPB INHALE 1 DOSE BY MOUTH TWICE DAILY 60 each 0  . albuterol (VENTOLIN HFA) 108 (90 Base) MCG/ACT inhaler INHALE TWO PUFFS BY MOUTH EVERY 4 HOURS AS NEEDED 18 each 5  . aspirin 81 MG tablet Take 81 mg by mouth at bedtime.     [name] atorvastatin (LIPITOR) 40 MG tablet Take 1 tablet (40 mg total) by mouth daily. 30 tablet 5  . clopidogrel (PLAVIX) 75 MG tablet Take 1 tablet (75 mg total) by mouth daily with breakfast. 30 tablet 5  . fexofenadine (ALLEGRA) 180 MG tablet Take 180 mg by mouth daily.    . fish oil-omega-3 fatty acids 1000 MG capsule Take 1 g by mouth 2 (two) times daily.     [name] lisinopril-hydrochlorothiazide (ZESTORETIC) 20-12.5 MG tablet Take 1 tablet by mouth daily. 30 tablet 5  . metoprolol tartrate (LOPRESSOR) 25 MG tablet Take 0.5 tablets (12.5 mg total) by mouth 2 (two) times daily. 30 tablet 5  . mometasone (NASONEX) 50 MCG/ACT nasal spray Place 2 sprays into the nose daily as needed (for allergies). 17 g 5  . Multiple Vitamin (MULTIVITAMIN) tablet Take 1 tablet by mouth daily.    . nitroGLYCERIN (NITROSTAT) 0.4 MG SL tablet Place 1 tablet (0.4 mg total) under the tongue every 5 (five) minutes x 3 doses as needed for chest pain. 25 tablet 12  . tiotropium (SPIRIVA HANDIHALER) 18 MCG inhalation capsule Place 1 capsule (18 mcg total) into inhaler and inhale daily. 30 capsule 5  . vitamin C (ASCORBIC ACID) 500 MG tablet Take 500 mg by mouth daily. Reported on 07/16/2015     No current facility-administered medications for this visit.     Pertinent items are noted in HPI.   Review of Systems:     Cardiac Review of Systems: Y or N  Chest Pain [  y  ]  Resting SOB [ n  ] Exertional SOB  Cove.Etienne  ]  Orthopnea [ n ]   Pedal Edema [  n  ]    Palpitations [   n] Syncope  [ n ]   Presyncope [ n  ]  General Review of Systems: [Y] = yes [  ]=no Constitional: recent weight change [  ];  Wt loss over the last 3 months [   ] anorexia [  ]; fatigue [  ]; nausea [  ]; night sweats [  ]; fever [  n]; or chills [ n ];          Dental: poor dentition[  ]; Last Dentist visit:   Eye : blurred vision [  ]; diplopia [   ]; vision changes [  ];  Amaurosis fugax[  ]; Resp: cough [  ];  wheezing[n  ];  hemoptysis[n  ]; shortness of breath[  ]; paroxysmal nocturnal dyspnea[  ]; dyspnea on exertion[  ]; or orthopnea[  ];  GI:  gallstones[  ], vomiting[  ];  dysphagia[  ]; melena[  ];  hematochezia [  ]; heartburn[  ];   Hx of  Colonoscopy[y  ]; GU: kidney stones [  ]; hematuria[  ];   dysuria [  ];  nocturia[  ];  history of     obstruction [  ]; urinary frequency [  ]             Skin: rash, swelling[  ];, hair loss[  ];  peripheral edema[  ];  or itching[  ]; Musculosketetal: myalgias[ n ];  joint swelling[  ];  joint erythema[  ];  joint pain[  ];  back pain[  ];  Heme/Lymph: bruising[  ];  bleeding[  ];  anemia[  ];  Neuro: TIA[ n ];  headaches[n  ];  stroke[n  ];  vertigo[  ];  seizures[  ];   paresthesias[  ];  difficulty walking[ n ];  Psych:depression[  ]; anxiety[  ];  Endocrine: diabetes[  ];  thyroid dysfunction[  ];  Immunizations: Flu up to date Cove.Etienne  ]; Pneumococcal up to date Cove.Etienne  ];  Other:  Physical Exam: BP 140/87   Pulse 75   Resp 20   Ht 5\' 9"  (1.753 m)   Wt 190 lb (86.2 kg)   SpO2 92% Comment: RA  BMI 28.06 kg/m   PHYSICAL EXAMINATION: General appearance: alert, cooperative, appears stated age and no distress Head: Normocephalic, without obvious abnormality, atraumatic Neck: no adenopathy, no carotid bruit, no JVD, supple, symmetrical, trachea midline and thyroid not enlarged, symmetric, no tenderness/mass/nodules Lymph nodes: Cervical, supraclavicular, and axillary nodes normal. Resp: clear to auscultation bilaterally Back: symmetric, no  curvature. ROM normal. No CVA tenderness. Cardio: systolic murmur: early systolic 3/6, crescendo at lower left sternal border GI: soft, non-tender; bowel sounds normal; no masses,  no organomegaly Extremities: extremities normal, atraumatic, no cyanosis or edema and Homans sign is negative, no sign of DVT Neurologic: Grossly normal  Diagnostic Studies & Laboratory data:     Recent Radiology Findings:   Ct Angio Chest Aorta W &/or Wo Contrast  Result Date: 08/13/2016 CLINICAL DATA:  Aortic stenosis, aortic regurgitation and bicuspid aortic valve. History of nonobstructive coronary artery disease. History of 4.7 cm abdominal aortic aneurysm. EXAM: CT ANGIOGRAPHY CHEST WITH CONTRAST TECHNIQUE: Multidetector CT imaging of the chest was performed using the standard protocol during bolus administration of intravenous contrast. Multiplanar CT image reconstructions and MIPs were obtained to evaluate the vascular anatomy. CONTRAST:  100 mL Isovue 370 IV COMPARISON:  Prior CTA of the chest on 01/12/2015  FINDINGS: Cardiovascular: Aorta: The aortic valve is heavily calcified. By a non-gated CTA, it is not entirely possible to delineate bicuspid versus tricuspid anatomy. Based on configuration of aortic sinuses, it is suspected that the valve is likely bicuspid. There is no associated aneurysmal disease of the thoracic aorta. The aorta measures 3 cm at the sinuses of Valsalva. The ascending thoracic aorta measures 3.7 cm in greatest diameter. The proximal arch measures 3 cm. The distal arch measures 2.9 cm. The descending thoracic aorta measures 2.5 cm. No evidence of aortic dissection. Proximal great vessels show normal patency. There is calcified plaque in the left subclavian artery which approaches the vertebral artery origin but does not appear to cause significant vertebral artery stenosis. Visualized upper abdominal aorta demonstrates scattered plaque. Proximal celiac axis demonstrates approximately 25-30%  stenosis. The origin of the superior mesenteric artery demonstrates minimal plaque without significant stenosis. Aneurysmal disease of the abdominal aorta is not visualized on this study. Cardiac/coronary: The heart size is normal. No overt cardiac chamber dilatation. Thinned myocardium at the left ventricular apex may relate to prior infarction. No evidence to suggest ventricular aneurysm. Coronary atherosclerosis noted with calcified plaque in the distribution of the LAD as well as the RCA. No pericardial fluid identified. Pulmonary: Central pulmonary arteries are well opacified and show normal caliber without filling defect. Pulmonary venous anatomy is normal. The left atrial appendage is normally patent. Mediastinum/Nodes: No enlarged mediastinal, hilar, or axillary lymph nodes. Thyroid gland, trachea, and esophagus demonstrate no significant findings. Lungs/Pleura: There is some degree of emphysematous disease, primarily affecting the upper lung zones and more prominently in the right lung compared to the left. Some scattered areas of mild parenchymal scarring are noted bilaterally. There is no evidence of pulmonary edema, consolidation, pneumothorax, nodule or pleural fluid. Upper Abdomen: No acute abnormality. Musculoskeletal: No chest wall abnormality. No acute or significant osseous findings. Review of the MIP images confirms the above findings. IMPRESSION: 1. Heavily calcified aortic valve with likely bicuspid anatomy. No evidence of associated aneurysmal disease of the thoracic aorta with maximal caliber of the ascending thoracic aorta of 3.7 cm. 2. Coronary atherosclerosis with calcified plaque noted in the distribution of the LAD and RCA. Thinned myocardium at the left ventricular apex may relate to prior infarction. 3. Emphysematous lung disease without acute pulmonary findings. Electronically Signed   By: Irish Lack M.D.   On: 08/13/2016 08:55     I have independently reviewed the above  radiologic studies.   Procedures   Left Heart Cath and Coronary Angiography  Conclusion    Prox RCA lesion, 40 %stenosed.  Mid RCA lesion, 30 %stenosed.  Ost Cx to Prox Cx lesion, 40 %stenosed.  Prox LAD lesion, 20 %stenosed.  Mid LAD lesion, 40 %stenosed.    Indications   Acute non Q wave myocardial infarction (HCC) [I21.4 (ICD-10-CM)]  Procedural Details/Technique   Technical Details After obtaining the informed consent patient was brought to the Cath Lab and was placed on fluoroscopy table. Right groin was prepped and draped in usual fashion. 1% Xylocaine was used for local anesthesia in the right groin. Patient received 1 mg of Versed and 25 g of IV fentanyl for conscious sedation. Patient was monitored during the conscious sedation total conscious sedation time was 49 minutes. With the help of thinwall needle 5 French artery sheath was placed. Sheath was aspirated and flushed. Next 5 French left Judkins catheter was advanced over the wire under fluoroscopic guidance up to the ascending aorta wire was pulled  out the catheter was aspirated and connected to the manifold cath was further advanced and engaged into left coronary ostium multiple views of the left system were taken next the catheter was disengaged and was pulled out over the wire and was replaced with 5 French right Judkins catheter which was advanced over the wire under fluoroscopic guidance up to the ascending aorta wire was pulled out the cath was aspirated and connected to the manifold cath was further advanced and engaged into right coronary ostium multiple views of the right system were taken next the catheter was disengaged and was pulled out over the wire and was replaced with 5 French angled pigtail catheter which was advanced over the wire under fluoroscopic guidance up to the ascending aorta wire was pulled out the catheter was aspirated and connected to the manifold catheter was further advanced and attempted to  cross the aortic valve without success this catheter was then switched to straight pigtail catheter which was advanced over the wire under fluoroscopic guidance up to the ascending aorta wire was pulled out the catheter was aspirated and connected to the manifold cath was further attempted to advance across aortic valve without success this catheter was switched to right Judkins catheter which was also unsuccessful to cross the aortic valve. Patient tolerated the procedure well there were no complications Finding LV not done Left main has 20-30% distal stenosis LAD has 20-30% proximal and mid sequential stenosis and 40% bifurcation stenosis with diagonal to diagonal 1 and 2 were patent Ramus was very very small which was patent Left circumflex as 30-40% proximal stenosis with haziness with TIMI 3 distal flow OM1 was very small which was diffusely diseased distally OM 2 was moderate size which was patent RCA has proximal and mid 30-40% stenosis PDA and PLV branches were patent. Patient tolerated procedure well there no complications Plan We will get 2-D echo to evaluate LV systolic function and wall motion and rule out aortic stenosis.   Estimated blood loss <50 mL.  During this procedure the patient was administered the following to achieve and maintain moderate conscious sedation: Versed 1 mg, Fentanyl 25 mcg, while the patient's heart rate, blood pressure, and oxygen saturation were continuously monitored. The period of conscious sedation was 49 minutes, of which I was present face-to-face 100% of this time.    Coronary Findings   Dominance: Right  Left Anterior Descending  Prox LAD lesion, 20% stenosed.  Mid LAD lesion, 40% stenosed.  Ramus Intermedius  Vessel is small.  Left Circumflex  Ost Cx to Prox Cx lesion, 40% stenosed.  First Obtuse Marginal Branch  Vessel is small in size.  Right Coronary Artery  Prox RCA lesion, 40% stenosed.  Mid RCA lesion, 30% stenosed.  Coronary  Diagrams   Diagnostic Diagram       Implants          Hemo Data    Most Recent Value  AO Systolic Pressure 111 mmHg  AO Diastolic Pressure 73 mmHg  AO Mean 89 mmHg   I have independently reviewed the above  cath films and reviewed the findings with the  patient .   Recent Lab Findings: Lab Results  Component Value Date   WBC 9.8 04/14/2016   HGB 15.7 04/14/2016   HCT 48.1 04/14/2016   PLT 166 04/14/2016   GLUCOSE 96 08/05/2016   CHOL 136 05/16/2016   TRIG 225 (H) 05/16/2016   HDL 35 (L) 05/16/2016   LDLCALC 56 05/16/2016   ALT 19  05/16/2016   AST 21 05/16/2016   NA 136 08/05/2016   K 4.4 08/05/2016   CL 100 08/05/2016   CREATININE 0.72 08/05/2016   BUN 14 08/05/2016   CO2 27 08/05/2016   INR 1.17 02/01/2016   HGBA1C 5.6 02/01/2016    ECHO:  [facility]  [address]  [place], [place] [address]  [phone]  -------------------------------------------------------------------  Transthoracic Echocardiography  Patient: [name], [name]  MR #: [id number]  Study Date: 06/22/2016  Gender: M  Age: 66  Height: 175.3 cm  Weight: 86.2 kg  BSA: 2.07 m^2  Pt. Status:  Room:  SONOGRAPHER [facility]  ATTENDING Prentice Docker, MD  ORDERING Prentice Docker, MD  REFERRING Prentice Docker, MD  PERFORMING [place]  cc:  -------------------------------------------------------------------  LV EF: 60% - 65%  -------------------------------------------------------------------  Indications: Aortic stenosis 424.1.  -------------------------------------------------------------------  History: PMH: Former Smoker. Aortic valve disease. Chronic  obstructive pulmonary disease. PMH: Myocardial infarction. Risk  factors: Dyslipidemia.  -------------------------------------------------------------------  Study Conclusions  - Left ventricle: The cavity size was normal. Wall thickness was  increased in a pattern of mild LVH. Systolic  function was normal.  The estimated ejection fraction was in the range of 60% to 65%.  Wall motion was normal; there were no regional wall motion  abnormalities. Left ventricular diastolic function parameters  were normal for the patient&'s age.  - Aortic valve: Leaflet structure difficult to identify, would  suspect bicuspid. There was severe stenosis. There was mild  regurgitation. Mean gradient (S): 48 mm Hg. Peak gradient (S): 83  mm Hg. VTI ratio of LVOT to aortic valve: 0.29. Valve area (VTI):  0.9 cm^2.  - Mitral valve: Calcified annulus.  - Right atrium: Central venous pressure (est): 3 mm Hg.  - Tricuspid valve: There was physiologic regurgitation.  - Pulmonary arteries: Systolic pressure could not be accurately  estimated.  - Pericardium, extracardiac: A prominent pericardial fat pad was  present.  Impressions:  - Mild LVH with LVEF 60-65% and normal diastolic function for age.  Mildly calcified mitral annulus. Severe calcific aortic stenosis  as outlined above with mild aortic regurgitation. Transvalvular  gradients have increased compared to the prior study in December  2017.  -------------------------------------------------------------------  Study data: Comparison was made to the study of 02/03/2016. Study  status: Routine. Procedure: Transthoracic echocardiography.  Image quality was adequate. Study completion: There were no  complications. Transthoracic echocardiography. M-mode,  complete 2D, spectral Doppler, and color Doppler. Birthdate:  Patient birthdate: [birth year]. Age: Patient is 66 yr old. Sex:  Gender: male. BMI: 28 kg/m^2. Blood pressure: 142/81  Patient status: Outpatient. Study date: Study date: 06/22/2016.  Study time: 11:24 AM. Location: Echo laboratory.  -------------------------------------------------------------------  -------------------------------------------------------------------  Left ventricle: The cavity size was normal. Wall thickness  was  increased in a pattern of mild LVH. Systolic function was normal.  The estimated ejection fraction was in the range of 60% to 65%.  Wall motion was normal; there were no regional wall motion  abnormalities. Left ventricular diastolic function parameters were  normal for the patient&'s age.  -------------------------------------------------------------------  Aortic valve: Severely calcified leaflets. Leaflet structure  difficult to identify, would suspect bicuspid. Doppler: There  was severe stenosis. There was mild regurgitation. VTI ratio  of LVOT to aortic valve: 0.29. Valve area (VTI): 0.9 cm^2. Indexed  valve area (VTI): 0.44 cm^2/m^2. Peak velocity ratio of LVOT to  aortic valve: 0.22. Valve area (Vmax): 0.7 cm^2. Indexed valve area  (Vmax): 0.34 cm^2/m^2.  Mean velocity ratio of LVOT to aortic valve:  0.23. Valve area (Vmean): 0.71 cm^2. Indexed valve area (Vmean):  0.34 cm^2/m^2. Mean gradient (S): 48 mm Hg. Peak gradient (S):  83 mm Hg.  -------------------------------------------------------------------  Aorta: Aortic root: The aortic root was normal in size.  -------------------------------------------------------------------  Mitral valve: Calcified annulus. Doppler: There was no  significant regurgitation.  -------------------------------------------------------------------  Left atrium: The atrium was normal in size.  -------------------------------------------------------------------  Right ventricle: The cavity size was normal. Systolic function was  normal.  -------------------------------------------------------------------  Pulmonic valve: Poorly visualized. The valve appears to be  grossly normal. Doppler: There was trivial regurgitation.  -------------------------------------------------------------------  Tricuspid valve: The valve appears to be grossly normal.  Doppler: There was physiologic regurgitation.   -------------------------------------------------------------------  Pulmonary artery: Systolic pressure could not be accurately  estimated.  -------------------------------------------------------------------  Right atrium: The atrium was normal in size.  -------------------------------------------------------------------  Pericardium: A prominent pericardial fat pad was present. There  was no pericardial effusion.  -------------------------------------------------------------------  Systemic veins:  Inferior vena cava: The vessel was normal in size. The  respirophasic diameter changes were in the normal range (>= 50%),  consistent with normal central venous pressure.  -------------------------------------------------------------------  Measurements  Left ventricle Value Reference  LV ID, ED, PLAX chordal (L) 39.5 mm 43 - 52  LV ID, ES, PLAX chordal 24.4 mm 23 - 38  LV fx shortening, PLAX chordal 38 % >=29  LV PW thickness, ED 11.6 mm ----------  IVS/LV PW ratio, ED 0.87 <=1.3  Stroke volume, 2D 104 ml ----------  Stroke volume/bsa, 2D 50 ml/m^2 ----------  LV ejection fraction, 1-p A4C 73 % ----------  LV end-diastolic volume, 2-p 94 ml ----------  LV end-systolic volume, 2-p 28 ml ----------  LV ejection fraction, 2-p 70 % ----------  Stroke volume, 2-p 66 ml ----------  LV end-diastolic volume/bsa, 2-p 46 ml/m^2 ----------  LV end-systolic volume/bsa, 2-p 13 ml/m^2 ----------  Stroke volume/bsa, 2-p 32 ml/m^2 ----------  LV e&', lateral 9.25 cm/s ----------  LV E/e&', lateral 6.81 ----------  LV e&', medial 7.51 cm/s ----------  LV E/e&', medial 8.39 ----------  LV e&', average 8.38 cm/s ----------  LV E/e&', average 7.52 ----------  Ventricular septum Value Reference  IVS thickness, ED 10.1 mm ----------  LVOT Value Reference  LVOT ID, S 20 mm ----------  LVOT area 3.14 cm^2 ----------  LVOT peak velocity, S 101 cm/s ----------  LVOT mean velocity, S 70.4 cm/s  ----------  LVOT VTI, S 33.1 cm ----------  LVOT peak gradient, S 4 mm Hg ----------  Aortic valve Value Reference  Aortic valve peak velocity, S 456 cm/s ----------  Aortic valve mean velocity, S 312 cm/s ----------  Aortic valve VTI, S 115 cm ----------  Aortic mean gradient, S 48 mm Hg ----------  Aortic peak gradient, S 83 mm Hg ----------  VTI ratio, LVOT/AV 0.29 ----------  Aortic valve area, VTI 0.9 cm^2 ----------  Aortic valve area/bsa, VTI 0.44 cm^2/m^2 ----------  Velocity ratio, peak, LVOT/AV 0.22 ----------  Aortic valve area, peak velocity 0.7 cm^2 ----------  Aortic valve area/bsa, peak 0.34 cm^2/m^2 ----------  velocity  Velocity ratio, mean, LVOT/AV 0.23 ----------  Aortic valve area, mean velocity 0.71 cm^2 ----------  Aortic valve area/bsa, mean 0.34 cm^2/m^2 ----------  velocity  Aortic regurg pressure half-time 578 ms ----------  Aorta Value Reference  Aortic root ID, ED 31 mm ----------  Left [facility] Value Reference  LA ID, A-P, ES 34 mm ----------  LA ID/bsa, A-P 1.65 cm/m^2 <=2.2  LA volume, ES, 1-p A4C 30.6 ml ----------  LA volume/bsa, ES, 1-p A4C 14.8 ml/m^2 ----------  LA volume, ES, 1-p A2C 47.7 ml ----------  LA volume/bsa, ES, 1-p A2C 23.1 ml/m^2 ----------  Mitral valve Value Reference  Mitral E-wave peak velocity 63 cm/s ----------  Mitral A-wave peak velocity 72.2 cm/s ----------  Mitral deceleration time (H) 349 ms 150 - 230  Mitral E/A ratio, peak 0.9 ----------  Right atrium Value Reference  RA ID, S-I, ES, A4C (H) 58.2 mm 34 - 49  RA area, ES, A4C 17.3 cm^2 8.3 - 19.5  RA volume, ES, A/L 44.5 ml ----------  RA volume/bsa, ES, A/L 21.5 ml/m^2 ----------  Systemic veins Value Reference  Estimated CVP 3 mm Hg ----------  Right ventricle Value Reference  RV ID, ED, PLAX 24.4 mm 19 - 38  TAPSE 24.9 mm ----------  RV s&', lateral, S 11.2 cm/s ----------  Legend:  (L) and (H) mark values outside specified reference range.   -------------------------------------------------------------------  Prepared and Electronically Authenticated by  Nona DellSamuel McDowell, M.D.  2018-05-17T08:51:27 .read  Assessment / Plan:      Patient with symptomatic critical aortic stenosis, with anginal symptoms, history of myocardial infarction in December 2017. Patient has recent dental checkup I recommend to the patient that we proceed with aortic valve replacement, possible coronary artery bypass grafting pending on status of updated cardiac catheterization. The patient questioned if he would be a candidate for TVAR.  I have reviewed the patient's cine films and information with Dr. Excell Seltzerooper who agrees the patient is not a high risk surgical patient and would not be a candidate for TVAR. Since the cardiac catheterization was done in December of last year, and although there were no high-grade lesions the patient did have coronary artery disease and may have been underestimated by the report. Repeat cardiac catheterization will be done planned to proceed with aortic valve placement. I discussed with the patient and his wife the pros and cons of both mechanical and tissue valve. He prefers a tissue valve.  Echocardiogram the patient likely has a bicuspid aortic valve, CT scan of the chest recently does not show any evidence of enlargement of the ace ascending aorta.  I  spent 40 minutes counseling the patient face to face and 50% or more the  time was spent in counseling and coordination of care. The total time spent in the appointment was 60 minutes.  Delight OvensEdward B Gaile Allmon MD      [address] [place] [id number] Office [phone]   Beeper [phone]  08/15/2016 4:04 PM

## 2016-08-15 NOTE — Patient Instructions (Signed)
Aortic Valve Stenosis Aortic valve stenosis is a narrowing of the aortic valve. The aortic valve opens and closes to regulate blood flow between the lower left chamber of the heart (left ventricle) and the blood vessel that leads away from the heart (aorta). When the aortic valve becomes narrow, it makes it difficult for the heart to pump blood into the aorta, which causes the heart to work harder. The extra work can weaken the heart over time. Aortic valve stenosis can range from mild to severe. If untreated, it can become more severe over time and can lead to heart failure. What are the causes? This condition may be caused by:  Buildup of calcium around and on the valve. This can occur with aging. This is the most common cause of aortic valve stenosis.  Birth defect.  Rheumatic fever.  Radiation to the chest. What increases the risk? You may be more likely to develop this condition if:  You are over the age of 65.  You were born with an abnormal bicuspid valve. What are the signs or symptoms? You may have no symptoms until your condition becomes severe. It may take 10-20 years for mild or moderate aortic valve stenosis to become severe. Symptoms may include:  Shortness of breath. This may get worse during physical activity.  Feeling unusually weak and tired (fatigue).  Extreme discomfort in the chest, neck, or arm (angina).  A heartbeat that is irregular or faster than normal (palpitations).  Dizziness or fainting. This may happen when you get physically tired or after you take certain heart medicines, such as nitroglycerin. How is this diagnosed? This condition may be diagnosed with:  A physical exam.  Echocardiogram. This is a type of imaging test that uses sound waves (ultrasound) to make an image of your heart. There are two types that may be used:  Transthoracic echocardiogram (TTE). This type of echocardiogram is noninvasive, and it is usually done  first.  Transesophageal echocardiogram (TEE). This type of echocardiogram is done by passing a flexible tube down your esophagus. The heart and the esophagus are close to each other, so your health care provider can take very clear, detailed pictures of the heart using this type of test.  Cardiac catheterization. In this procedure, a thin, flexible tube (catheter) is passed through a large vein in your neck, groin, or arm. This procedure provides information about arteries, structures, blood pressure, and oxygen levels in your heart.  Electrocardiogram (ECG). This records the electrical impulses of your heart and assesses heart function.  Stress tests. These are tests that evaluate the blood supply to your heart and your heart's response to exercise.  Blood tests. You may work with a health care provider who specializes in the heart (cardiologist). How is this treated? Treatment depends on how severe your condition is and what your symptoms are. You will need to have your heart checked regularly to make sure that your condition is not getting worse or causing serious problems. If your condition is mild, no treatment may be needed. Treatment may include:  Medicines that help keep your heart rate regular.  Medicines that thin your blood (anticoagulants) to prevent the formation of blood clots.  Antibiotic medicines to help prevent infection.  Surgery to replace your aortic valve. This is the most common treatment for aortic valve stenosis. Several types of surgeries are available. The surgery may be done through a large incision over your heart (open heart surgery), or it may be done using a minimally   done using a minimally invasive technique (transcatheter aortic valve replacement, or TAVR).  Follow these instructions at home: Lifestyle   Limit alcohol intake to no more than 1 drink per day for nonpregnant women and 2 drinks per day for men. One drink equals 12 oz of beer, 5 oz of wine, or 1 oz of hard  liquor.  Do not use any tobacco products, such as cigarettes, chewing tobacco, or e-cigarettes. If you need help quitting, ask your health care provider.  Work with your health care provider to manage your blood pressure and cholesterol.  Maintain a healthy weight. Eating and drinking  Follow instructions from your health care provider about eating or drinking restrictions. ? Limit how much caffeine you drink. Caffeine can affect your heart's rate and rhythm.  Drink enough fluid to keep your urine clear or pale yellow.  Eat a heart-healthy diet. This should include plenty of fresh fruits and vegetables. If you eat meat, it should be lean cuts. Avoid foods that are: ? High in salt, saturated fat, or sugar. ? Canned or highly processed. ? Fried. Activity  Return to your normal activities as told by your health care provider. Ask your health care provider what activities are safe for you.  Exercise regularly, as told by your health care provider. Ask your health care provider what types of exercise are safe for you.  If your aortic valve stenosis is mild, you may need to avoid only very intense physical activity. The more severe your aortic valve stenosis is, the more activities you may need to avoid. General instructions  Take over-the-counter and prescription medicines only as told by your health care provider.  If you are a woman and you plan to become pregnant, talk with your health care provider before you become pregnant.  Tell all health care providers who care for you that you have aortic valve stenosis.  Keep all follow-up visits as told by your health care provider. This is important. Contact a health care provider if:  You have a fever. Get help right away if:  You develop chest pain or tightness.  You develop shortness of breath or difficulty breathing.  You feel light-headed.  You feel like you might faint.  Your heartbeat is irregular or faster than  normal. These symptoms may represent a serious problem that is an emergency. Do not wait to see if the symptoms will go away. Get medical help right away. Call your local emergency services (911 in the [place] Do not drive yourself to the hospital. This information is not intended to replace advice given to you by your health care provider. Make sure you discuss any questions you have with your health care provider. Document Released: 10/23/2002 Document Revised: 07/02/2015 Document Reviewed: 12/28/2014 Elsevier Interactive Patient Education  2017 [employer]    Aortic Valve Replacement, Care After Refer to this sheet in the next few weeks. These instructions provide you with information about caring for yourself after your procedure. Your health care provider may also give you more specific instructions. Your treatment has been planned according to current medical practices, but problems sometimes occur. Call your health care provider if you have any problems or questions after your procedure. What can I expect after the procedure? After the procedure, it is common to have:  Pain around your incision area.  A small amount of blood or clear fluid coming from your incision.  Follow these instructions at home: Eating and drinking   Follow instructions from your  health care provider about eating or drinking restrictions. ? Limit alcohol intake to no more than 1 drink per day for nonpregnant women and 2 drinks per day for men. One drink equals 12 oz of beer, 5 oz of wine, or 1 oz of hard liquor. ? Limit how much caffeine you drink. Caffeine can affect your heart's rate and rhythm.  Drink enough fluid to keep your urine clear or pale yellow.  Eat a heart-healthy diet. This should include plenty of fresh fruits and vegetables. If you eat meat, it should be lean cuts. Avoid foods that are: ? High in salt, saturated fat, or sugar. ? Canned or highly processed. ? Fried. Activity  Return to  your normal activities as told by your health care provider. Ask your health care provider what activities are safe for you.  Exercise regularly once you have recovered, as told by your health care provider.  Avoid sitting for more than 2 hours at a time without moving. Get up and move around at least once every 1-2 hours. This helps to prevent blood clots in the legs.  Do not lift anything that is heavier than 10 lb (4.5 kg) until your health care provider approves.  Avoid pushing or pulling things with your arms until your health care provider approves. This includes pulling on handrails to help you climb stairs. Incision care   Follow instructions from your health care provider about how to take care of your incision. Make sure you: ? Wash your hands with soap and water before you change your bandage (dressing). If soap and water are not available, use hand sanitizer. ? Change your dressing as told by your health care provider. ? Leave stitches (sutures), skin glue, or adhesive strips in place. These skin closures may need to stay in place for 2 weeks or longer. If adhesive strip edges start to loosen and curl up, you may trim the loose edges. Do not remove adhesive strips completely unless your health care provider tells you to do that.  Check your incision area every day for signs of infection. Check for: ? More redness, swelling, or pain. ? More fluid or blood. ? Warmth. ? Pus or a bad smell. Medicines  Take over-the-counter and prescription medicines only as told by your health care provider.  If you were prescribed an antibiotic medicine, take it as told by your health care provider. Do not stop taking the antibiotic even if you start to feel better. Travel  Avoid airplane travel for as long as told by your health care provider.  When you travel, bring a list of your medicines and a record of your medical history with you. Carry your medicines with you. Driving  Ask your  health care provider when it is safe for you to drive. Do not drive until your health care provider approves.  Do not drive or operate heavy machinery while taking prescription pain medicine. Lifestyle   Do not use any tobacco products, such as cigarettes, chewing tobacco, or e-cigarettes. If you need help quitting, ask your health care provider.  Resume sexual activity as told by your health care provider. Do not use medicines for erectile dysfunction unless your health care provider approves, if this applies.  Work with your health care provider to keep your blood pressure and cholesterol under control, and to manage any other heart conditions that you have.  Maintain a healthy weight. General instructions  Do not take baths, swim, or use a hot tub until your  health care provider approves.  Do not strain to have a bowel movement.  Avoid crossing your legs while sitting down.  Check your temperature every day for a fever. A fever may be a sign of infection.  If you are a woman and you plan to become pregnant, talk with your health care provider before you become pregnant.  Wear compression stockings if your health care provider instructs you to do this. These stockings help to prevent blood clots and reduce swelling in your legs.  Tell all health care providers who care for you that you have an artificial (prosthetic) aortic valve. If you have or have had heart disease or endocarditis, tell all health care providers about these conditions as well.  Keep all follow-up visits as told by your health care provider. This is important. Contact a health care provider if:  You develop a skin rash.  You experience sudden, unexplained changes in your weight.  You have more redness, swelling, or pain around your incision.  You have more fluid or blood coming from your incision.  Your incision feels warm to the touch.  You have pus or a bad smell coming from your incision.  You have  a fever. Get help right away if:  You develop chest pain that is different from the pain coming from your incision.  You develop shortness of breath or difficulty breathing.  You start to feel light-headed. These symptoms may represent a serious problem that is an emergency. Do not wait to see if the symptoms will go away. Get medical help right away. Call your local emergency services (911 in the [place] Do not drive yourself to the hospital. This information is not intended to replace advice given to you by your health care provider. Make sure you discuss any questions you have with your health care provider. Document Released: 08/12/2004 Document Revised: 07/02/2015 Document Reviewed: 12/28/2014 Elsevier Interactive Patient Education  2017 [employer]  Aortic Valve Replacement Aortic valve replacement is surgical replacement of an aortic valve that cannot be repaired. This procedure may be done to replace:  A narrow aortic valve (aortic valve stenosis).  A deformed aortic valve (bicuspid aortic valve).  An aortic valve that does not close all the way (aortic insufficiency).  The aortic valve is replaced with artificial (prosthetic) valve. Three types of prosthetic valves are available:  Mechanical valves made entirely from man-made materials.  Donor valves from human donors. These are only used in special situations.  Biological valves made from animal tissues.  The type of prosthetic valve used will be determined based on various factors, including your age, your lifestyle, and other medical conditions you have. This procedure is done using an open surgical technique, meaning that a large incision will be made in your chest over your heart. Tell a health care provider about:  Any allergies you have.  All medicines you are taking, including vitamins, herbs, eye drops, creams, and over-the-counter medicines.  Any problems you or family members have had with anesthetic  medicines.  Any blood disorders you have.  Any surgeries you have had.  Any medical conditions you have.  Whether you are pregnant or may be pregnant. What are the risks? Generally, this is a safe procedure. However, problems may occur, including:  Infection.  Bleeding.  Allergic reactions to medicines.  Damage to other structures or organs.  Blood clotting caused by the prosthetic valve.  Failure of the prosthetic valve.  What happens before the procedure?  Ask your  health care provider about: ? Changing or stopping your regular medicines. This is especially important if you are taking diabetes medicines or blood thinners. ? Taking medicines such as aspirin and ibuprofen. These medicines can thin your blood. Do not take these medicines before your procedure if your health care provider instructs you not to.  Follow instructions from your health care provider about eating or drinking restrictions.  You may be given antibiotic medicine to help prevent infection.  You may have tests, such as: ? Echocardiogram. ? Electrocardiogram (ECG). ? Blood tests. ? Urine tests.  Plan to have someone take you home when you leave the hospital.  Plan to have someone with you for at least 24 hours after you leave the hospital. What happens during the procedure?  To reduce your risk of infection: ? Your health care team will wash or sanitize their hands. ? Your skin will be washed with soap.  An IV tube will be inserted into one of your veins.  You will be given one or more of the following: ? A medicine to help you relax (sedative). ? A medicine to make you fall asleep (general anesthetic).  You will be placed on a heart-lung bypass machine. This machine provides oxygen to your blood while your heart is undergoing surgery.  An incision will be made in your chest, over your heart.  Your damaged aortic valve will be removed.  A prosthetic valve will be sewn into your  heart.  Your incision will be closed with stitches (sutures), skin glue, or adhesive tape.  A bandage (dressing) will be placed over your incision. The procedure may vary among health care providers and hospitals. What happens after the procedure?  Your blood pressure, heart rate, breathing rate, and blood oxygen level will be monitored often until the medicines you were given have worn off.  Do not drive until your health care provider approves. This information is not intended to replace advice given to you by your health care provider. Make sure you discuss any questions you have with your health care provider. Document Released: 06/15/2004 Document Revised: 07/02/2015 Document Reviewed: 12/28/2014 Elsevier Interactive Patient Education  2017 [employer].

## 2016-08-17 ENCOUNTER — Other Ambulatory Visit: Payer: Self-pay | Admitting: *Deleted

## 2016-08-17 DIAGNOSIS — I350 Nonrheumatic aortic (valve) stenosis: Secondary | ICD-10-CM

## 2016-08-23 ENCOUNTER — Telehealth: Payer: Self-pay | Admitting: *Deleted

## 2016-08-23 NOTE — Telephone Encounter (Signed)
-----   Message from Laqueta LindenSuresh A Koneswaran, MD sent at 08/13/2016  9:33 AM EDT ----- No evidence of aneurysm in thorax or visualized portion of abdomen.

## 2016-08-23 NOTE — Telephone Encounter (Signed)
Patient informed. 

## 2016-08-24 ENCOUNTER — Ambulatory Visit (INDEPENDENT_AMBULATORY_CARE_PROVIDER_SITE_OTHER): Payer: BLUE CROSS/BLUE SHIELD | Admitting: Cardiothoracic Surgery

## 2016-08-24 ENCOUNTER — Encounter: Payer: Self-pay | Admitting: Cardiothoracic Surgery

## 2016-08-24 VITALS — BP 129/88 | HR 71 | Resp 20 | Ht 69.0 in | Wt 190.0 lb

## 2016-08-24 DIAGNOSIS — I350 Nonrheumatic aortic (valve) stenosis: Secondary | ICD-10-CM

## 2016-08-24 NOTE — Progress Notes (Signed)
[address]       [place] [id number]             [phone]                    Lowell BoutonKenneth D Kalmbach [facility] Medical Record #[id number] Date of Birth: 10/01/1950  Referring: Laqueta LindenKoneswaran, Suresh A, MD Primary Care: Babs SciaraLuking, [name] A, MD  Chief Complaint:    Chief Complaint  Patient presents with  . Aortic Stenosis    Further discuss surgery scheduled for 09/05/16, Cardic Cath scheduled for 08/29/16    History of Present Illness:    Lowell BoutonKenneth D Ousley 66 y.o. male is seen in the office  today for Evaluation of aortic stenosis. In late December 2017 the patient went to the emergency room with pneumonia symptoms, cough shortness of breath and chest discomfort. [facility] and when troponins were elevated to 1.640 [facility] and [facility] catheterization. On exam and echocardiogram he was noted to have critical aortic stenosis. In addition he has a 4.7 cm abdominal aortic aneurysm last measured on 07/28/2016 ultrasound.  The patient comes in now noting increasing symptoms he notes increasing chest discomfort and shortness of breath with exertion over the past several months, he denies syncope or near-syncope.  He has a previous smoker but quit 15 years ago   Current Activity/ Functional Status:  Patient is independent with mobility/ambulation, transfers, ADL's, IADL's.   Zubrod Score: At the time of surgery this patient's most appropriate activity status/level should be described as: []     0    Normal activity, no symptoms [x]     1    Restricted in physical strenuous activity but ambulatory, able to do out light work []     2    Ambulatory and capable of self care, unable to do work activities, up and about               >50 % of waking hours                              []     3    Only limited self care, in bed greater than 50% of waking hours []     4    Completely disabled, no self care, confined to bed or chair []     5     Moribund   Past Medical History:  Diagnosis Date  . AAA (abdominal aortic aneurysm) (HCC)   . COPD (chronic obstructive pulmonary disease) (HCC)   . Fatty liver   . Hyperlipidemia   . Hypertension   . White coat hypertension     Past Surgical History:  Procedure Laterality Date  . ANKLE SURGERY Right 2009  . APPENDECTOMY    . CARDIAC CATHETERIZATION N/A 02/02/2016   Procedure: Left Heart Cath and Coronary Angiography;  Surgeon: Rinaldo CloudMohan Harwani, MD;  [facility];  Service: Cardiovascular;  Laterality: N/A;  . COLONOSCOPY  06/22/2010   OZH:[id number]:Diminutive rectal and right colon polyps removed remainder rectum and colon appared normal  . COLONOSCOPY WITH PROPOFOL N/A 05/15/2014   Procedure: COLONOSCOPY WITH PROPOFOL;  Surgeon: Corbin Adeobert M Rourk, MD;  Location: [facility] ORS;  Service: Endoscopy;  Laterality: N/A;  In cecum @ [id number], out @ 0937, withdrawal time 19 minutes  . POLYPECTOMY N/A 05/15/2014   Procedure: POLYPECTOMY;  Surgeon: Corbin Adeobert M Rourk, MD;  Location: [facility]  ORS;  Service: Endoscopy;  Laterality: N/A;    Family History  Problem Relation Age of Onset  . Hypertension Sister   . AAA (abdominal aortic aneurysm) Brother   . Colon cancer Neg Hx   . Liver disease Neg Hx     Social History   Social History  . Marital status: Married    Spouse name: N/A  . Number of children: N/A  . Years of education: N/A   Occupational History  . Not on file.   Social History Main Topics  . Smoking status: Former Smoker    Packs/day: 1.50    Years: 20.00    Types: Cigarettes    Quit date: 04/16/2002  . Smokeless tobacco: Never Used  . Alcohol use 3.0 oz/week    5 Cans of beer per week     Comment: per pt  . Drug use: No  . Sexual activity: No   Other Topics Concern  . Not on file   Social History Narrative  . No narrative on file    History  Smoking Status  . Former Smoker  . Packs/day: 1.50  . Years: 20.00  . Types: Cigarettes  . Quit date: 04/16/2002  Smokeless Tobacco   . Never Used    History  Alcohol Use  . 3.0 oz/week  . 5 Cans of beer per week    Comment: per pt     No Known Allergies  Current Outpatient Prescriptions  Medication Sig Dispense Refill  . ADVAIR DISKUS 100-50 MCG/DOSE AEPB INHALE 1 DOSE BY MOUTH TWICE DAILY 60 each 0  . albuterol (VENTOLIN HFA) 108 (90 Base) MCG/ACT inhaler INHALE TWO PUFFS BY MOUTH EVERY 4 HOURS AS NEEDED 18 each 5  . aspirin 81 MG tablet Take 81 mg by mouth at bedtime.     [name] atorvastatin (LIPITOR) 40 MG tablet Take 1 tablet (40 mg total) by mouth daily. 30 tablet 5  . clopidogrel (PLAVIX) 75 MG tablet Take 1 tablet (75 mg total) by mouth daily with breakfast. 30 tablet 5  . fexofenadine (ALLEGRA) 180 MG tablet Take 180 mg by mouth daily.    . fish oil-omega-3 fatty acids 1000 MG capsule Take 1 g by mouth 2 (two) times daily.     [name] lisinopril-hydrochlorothiazide (ZESTORETIC) 20-12.5 MG tablet Take 1 tablet by mouth daily. 30 tablet 5  . metoprolol tartrate (LOPRESSOR) 25 MG tablet Take 0.5 tablets (12.5 mg total) by mouth 2 (two) times daily. 30 tablet 5  . mometasone (NASONEX) 50 MCG/ACT nasal spray Place 2 sprays into the nose daily as needed (for allergies). 17 g 5  . Multiple Vitamin (MULTIVITAMIN) tablet Take 1 tablet by mouth daily.    . nitroGLYCERIN (NITROSTAT) 0.4 MG SL tablet Place 1 tablet (0.4 mg total) under the tongue every 5 (five) minutes x 3 doses as needed for chest pain. 25 tablet 12  . tiotropium (SPIRIVA HANDIHALER) 18 MCG inhalation capsule Place 1 capsule (18 mcg total) into inhaler and inhale daily. 30 capsule 5  . vitamin C (ASCORBIC ACID) 500 MG tablet Take 500 mg by mouth daily. Reported on 07/16/2015     No current facility-administered medications for this visit.     Pertinent items are noted in HPI.   Review of Systems:     Cardiac Review of Systems: Y or N  Chest Pain [  y  ]  Resting SOB [ n  ] Exertional SOB  Cove.Etienne  ]  Orthopnea [ n ]  Pedal Edema [ n  ]    Palpitations [   n] Syncope  [ n ]   Presyncope [ n  ]  General Review of Systems: [Y] = yes [  ]=no Constitional: recent weight change [  ];  Wt loss over the last 3 months [   ] anorexia [  ]; fatigue [  ]; nausea [  ]; night sweats [  ]; fever [  n]; or chills [ n ];          Dental: poor dentition[  ]; Last Dentist visit:   Eye : blurred vision [  ]; diplopia [   ]; vision changes [  ];  Amaurosis fugax[  ]; Resp: cough [  ];  wheezing[n  ];  hemoptysis[n  ]; shortness of breath[  ]; paroxysmal nocturnal dyspnea[  ]; dyspnea on exertion[  ]; or orthopnea[  ];  GI:  gallstones[  ], vomiting[  ];  dysphagia[  ]; melena[  ];  hematochezia [  ]; heartburn[  ];   Hx of  Colonoscopy[y  ]; GU: kidney stones [  ]; hematuria[  ];   dysuria [  ];  nocturia[  ];  history of     obstruction [  ]; urinary frequency [  ]             Skin: rash, swelling[  ];, hair loss[  ];  peripheral edema[  ];  or itching[  ]; Musculosketetal: myalgias[ n ];  joint swelling[  ];  joint erythema[  ];  joint pain[  ];  back pain[  ];  Heme/Lymph: bruising[  ];  bleeding[  ];  anemia[  ];  Neuro: TIA[ n ];  headaches[n  ];  stroke[n  ];  vertigo[  ];  seizures[  ];   paresthesias[  ];  difficulty walking[ n ];  Psych:depression[  ]; anxiety[  ];  Endocrine: diabetes[  ];  thyroid dysfunction[  ];  Immunizations: Flu up to date Cove.Etienne  ]; Pneumococcal up to date Cove.Etienne  ];  Other:  Physical Exam: BP 129/88   Pulse 71   Resp 20   Ht 5\' 9"  (1.753 m)   Wt 190 lb (86.2 kg)   SpO2 94% Comment: RA  BMI 28.06 kg/m   PHYSICAL EXAMINATION: General appearance: alert, cooperative, appears stated age and no distress Head: Normocephalic, without obvious abnormality, atraumatic Neck: no adenopathy, no carotid bruit, no JVD, supple, symmetrical, trachea midline and thyroid not enlarged, symmetric, no tenderness/mass/nodules Lymph nodes: Cervical, supraclavicular, and axillary nodes normal. Resp: clear to auscultation bilaterally Back: symmetric, no  curvature. ROM normal. No CVA tenderness. Cardio: systolic murmur: early systolic 3/6, crescendo at lower left sternal border GI: soft, non-tender; bowel sounds normal; no masses,  no organomegaly Extremities: extremities normal, atraumatic, no cyanosis or edema and Homans sign is negative, no sign of DVT Neurologic: Grossly normal  Diagnostic Studies & Laboratory data:     Recent Radiology Findings:   Ct Angio Chest Aorta W &/or Wo Contrast  Result Date: 08/13/2016 CLINICAL DATA:  Aortic stenosis, aortic regurgitation and bicuspid aortic valve. History of nonobstructive coronary artery disease. History of 4.7 cm abdominal aortic aneurysm. EXAM: CT ANGIOGRAPHY CHEST WITH CONTRAST TECHNIQUE: Multidetector CT imaging of the chest was performed using the standard protocol during bolus administration of intravenous contrast. Multiplanar CT image reconstructions and MIPs were obtained to evaluate the vascular anatomy. CONTRAST:  100 mL Isovue 370 IV COMPARISON:  Prior CTA of the  chest on 01/12/2015 FINDINGS: Cardiovascular: Aorta: The aortic valve is heavily calcified. By a non-gated CTA, it is not entirely possible to delineate bicuspid versus tricuspid anatomy. Based on configuration of aortic sinuses, it is suspected that the valve is likely bicuspid. There is no associated aneurysmal disease of the thoracic aorta. The aorta measures 3 cm at the sinuses of Valsalva. The ascending thoracic aorta measures 3.7 cm in greatest diameter. The proximal arch measures 3 cm. The distal arch measures 2.9 cm. The descending thoracic aorta measures 2.5 cm. No evidence of aortic dissection. Proximal great vessels show normal patency. There is calcified plaque in the left subclavian artery which approaches the vertebral artery origin but does not appear to cause significant vertebral artery stenosis. Visualized upper abdominal aorta demonstrates scattered plaque. Proximal celiac axis demonstrates approximately 25-30%  stenosis. The origin of the superior mesenteric artery demonstrates minimal plaque without significant stenosis. Aneurysmal disease of the abdominal aorta is not visualized on this study. Cardiac/coronary: The heart size is normal. No overt cardiac chamber dilatation. Thinned myocardium at the left ventricular apex may relate to prior infarction. No evidence to suggest ventricular aneurysm. Coronary atherosclerosis noted with calcified plaque in the distribution of the LAD as well as the RCA. No pericardial fluid identified. Pulmonary: Central pulmonary arteries are well opacified and show normal caliber without filling defect. Pulmonary venous anatomy is normal. The left atrial appendage is normally patent. Mediastinum/Nodes: No enlarged mediastinal, hilar, or axillary lymph nodes. Thyroid gland, trachea, and esophagus demonstrate no significant findings. Lungs/Pleura: There is some degree of emphysematous disease, primarily affecting the upper lung zones and more prominently in the right lung compared to the left. Some scattered areas of mild parenchymal scarring are noted bilaterally. There is no evidence of pulmonary edema, consolidation, pneumothorax, nodule or pleural fluid. Upper Abdomen: No acute abnormality. Musculoskeletal: No chest wall abnormality. No acute or significant osseous findings. Review of the MIP images confirms the above findings. IMPRESSION: 1. Heavily calcified aortic valve with likely bicuspid anatomy. No evidence of associated aneurysmal disease of the thoracic aorta with maximal caliber of the ascending thoracic aorta of 3.7 cm. 2. Coronary atherosclerosis with calcified plaque noted in the distribution of the LAD and RCA. Thinned myocardium at the left ventricular apex may relate to prior infarction. 3. Emphysematous lung disease without acute pulmonary findings. Electronically Signed   By: Irish Lack M.D.   On: 08/13/2016 08:55     I have independently reviewed the above  radiologic studies.   Procedures   Left Heart Cath and Coronary Angiography  Conclusion    Prox RCA lesion, 40 %stenosed.  Mid RCA lesion, 30 %stenosed.  Ost Cx to Prox Cx lesion, 40 %stenosed.  Prox LAD lesion, 20 %stenosed.  Mid LAD lesion, 40 %stenosed.    Indications   Acute non Q wave myocardial infarction (HCC) [I21.4 (ICD-10-CM)]  Procedural Details/Technique   Technical Details After obtaining the informed consent patient was brought to the Cath Lab and was placed on fluoroscopy table. Right groin was prepped and draped in usual fashion. 1% Xylocaine was used for local anesthesia in the right groin. Patient received 1 mg of Versed and 25 g of IV fentanyl for conscious sedation. Patient was monitored during the conscious sedation total conscious sedation time was 49 minutes. With the help of thinwall needle 5 French artery sheath was placed. Sheath was aspirated and flushed. Next 5 French left Judkins catheter was advanced over the wire under fluoroscopic guidance up to the ascending aorta  wire was pulled out the catheter was aspirated and connected to the manifold cath was further advanced and engaged into left coronary ostium multiple views of the left system were taken next the catheter was disengaged and was pulled out over the wire and was replaced with 5 French right Judkins catheter which was advanced over the wire under fluoroscopic guidance up to the ascending aorta wire was pulled out the cath was aspirated and connected to the manifold cath was further advanced and engaged into right coronary ostium multiple views of the right system were taken next the catheter was disengaged and was pulled out over the wire and was replaced with 5 French angled pigtail catheter which was advanced over the wire under fluoroscopic guidance up to the ascending aorta wire was pulled out the catheter was aspirated and connected to the manifold catheter was further advanced and attempted to  cross the aortic valve without success this catheter was then switched to straight pigtail catheter which was advanced over the wire under fluoroscopic guidance up to the ascending aorta wire was pulled out the catheter was aspirated and connected to the manifold cath was further attempted to advance across aortic valve without success this catheter was switched to right Judkins catheter which was also unsuccessful to cross the aortic valve. Patient tolerated the procedure well there were no complications Finding LV not done Left main has 20-30% distal stenosis LAD has 20-30% proximal and mid sequential stenosis and 40% bifurcation stenosis with diagonal to diagonal 1 and 2 were patent Ramus was very very small which was patent Left circumflex as 30-40% proximal stenosis with haziness with TIMI 3 distal flow OM1 was very small which was diffusely diseased distally OM 2 was moderate size which was patent RCA has proximal and mid 30-40% stenosis PDA and PLV branches were patent. Patient tolerated procedure well there no complications Plan We will get 2-D echo to evaluate LV systolic function and wall motion and rule out aortic stenosis.   Estimated blood loss <50 mL.  During this procedure the patient was administered the following to achieve and maintain moderate conscious sedation: Versed 1 mg, Fentanyl 25 mcg, while the patient's heart rate, blood pressure, and oxygen saturation were continuously monitored. The period of conscious sedation was 49 minutes, of which I was present face-to-face 100% of this time.    Coronary Findings   Dominance: Right  Left Anterior Descending  Prox LAD lesion, 20% stenosed.  Mid LAD lesion, 40% stenosed.  Ramus Intermedius  Vessel is small.  Left Circumflex  Ost Cx to Prox Cx lesion, 40% stenosed.  First Obtuse Marginal Branch  Vessel is small in size.  Right Coronary Artery  Prox RCA lesion, 40% stenosed.  Mid RCA lesion, 30% stenosed.  Coronary  Diagrams   Diagnostic Diagram       Implants          Hemo Data    Most Recent Value  AO Systolic Pressure 111 mmHg  AO Diastolic Pressure 73 mmHg  AO Mean 89 mmHg   I have independently reviewed the above  cath films and reviewed the findings with the  patient .   Recent Lab Findings: Lab Results  Component Value Date   WBC 9.8 04/14/2016   HGB 15.7 04/14/2016   HCT 48.1 04/14/2016   PLT 166 04/14/2016   GLUCOSE 96 08/05/2016   CHOL 136 05/16/2016   TRIG 225 (H) 05/16/2016   HDL 35 (L) 05/16/2016   LDLCALC 56 05/16/2016  ALT 19 05/16/2016   AST 21 05/16/2016   NA 136 08/05/2016   K 4.4 08/05/2016   CL 100 08/05/2016   CREATININE 0.72 08/05/2016   BUN 14 08/05/2016   CO2 27 08/05/2016   INR 1.17 02/01/2016   HGBA1C 5.6 02/01/2016    ECHO:  [facility]  [address]  [place], [place] [address]  [phone]  -------------------------------------------------------------------  Transthoracic Echocardiography  Patient: [name], [name]  MR #: [id number]  Study Date: 06/22/2016  Gender: M  Age: 66  Height: 175.3 cm  Weight: 86.2 kg  BSA: 2.07 m^2  Pt. Status:  Room:  SONOGRAPHER [facility]  ATTENDING Prentice Docker, MD  ORDERING Prentice Docker, MD  REFERRING Prentice Docker, MD  PERFORMING [place]  cc:  -------------------------------------------------------------------  LV EF: 60% - 65%  -------------------------------------------------------------------  Indications: Aortic stenosis 424.1.  -------------------------------------------------------------------  History: PMH: Former Smoker. Aortic valve disease. Chronic  obstructive pulmonary disease. PMH: Myocardial infarction. Risk  factors: Dyslipidemia.  -------------------------------------------------------------------  Study Conclusions  - Left ventricle: The cavity size was normal. Wall thickness was  increased in a pattern of mild LVH. Systolic  function was normal.  The estimated ejection fraction was in the range of 60% to 65%.  Wall motion was normal; there were no regional wall motion  abnormalities. Left ventricular diastolic function parameters  were normal for the patient&'s age.  - Aortic valve: Leaflet structure difficult to identify, would  suspect bicuspid. There was severe stenosis. There was mild  regurgitation. Mean gradient (S): 48 mm Hg. Peak gradient (S): 83  mm Hg. VTI ratio of LVOT to aortic valve: 0.29. Valve area (VTI):  0.9 cm^2.  - Mitral valve: Calcified annulus.  - Right atrium: Central venous pressure (est): 3 mm Hg.  - Tricuspid valve: There was physiologic regurgitation.  - Pulmonary arteries: Systolic pressure could not be accurately  estimated.  - Pericardium, extracardiac: A prominent pericardial fat pad was  present.  Impressions:  - Mild LVH with LVEF 60-65% and normal diastolic function for age.  Mildly calcified mitral annulus. Severe calcific aortic stenosis  as outlined above with mild aortic regurgitation. Transvalvular  gradients have increased compared to the prior study in December  2017.  -------------------------------------------------------------------  Study data: Comparison was made to the study of 02/03/2016. Study  status: Routine. Procedure: Transthoracic echocardiography.  Image quality was adequate. Study completion: There were no  complications. Transthoracic echocardiography. M-mode,  complete 2D, spectral Doppler, and color Doppler. Birthdate:  Patient birthdate: 02/07/1951. Age: Patient is 66 yr old. Sex:  Gender: male. BMI: 28 kg/m^2. Blood pressure: 142/81  Patient status: Outpatient. Study date: Study date: 06/22/2016.  Study time: 11:24 AM. Location: Echo laboratory.  -------------------------------------------------------------------  -------------------------------------------------------------------  Left ventricle: The cavity size was normal. Wall thickness  was  increased in a pattern of mild LVH. Systolic function was normal.  The estimated ejection fraction was in the range of 60% to 65%.  Wall motion was normal; there were no regional wall motion  abnormalities. Left ventricular diastolic function parameters were  normal for the patient&'s age.  -------------------------------------------------------------------  Aortic valve: Severely calcified leaflets. Leaflet structure  difficult to identify, would suspect bicuspid. Doppler: There  was severe stenosis. There was mild regurgitation. VTI ratio  of LVOT to aortic valve: 0.29. Valve area (VTI): 0.9 cm^2. Indexed  valve area (VTI): 0.44 cm^2/m^2. Peak velocity ratio of LVOT to  aortic valve: 0.22. Valve area (Vmax): 0.7 cm^2. Indexed valve area  (Vmax):  0.34 cm^2/m^2. Mean velocity ratio of LVOT to aortic valve:  0.23. Valve area (Vmean): 0.71 cm^2. Indexed valve area (Vmean):  0.34 cm^2/m^2. Mean gradient (S): 48 mm Hg. Peak gradient (S):  83 mm Hg.  -------------------------------------------------------------------  Aorta: Aortic root: The aortic root was normal in size.  -------------------------------------------------------------------  Mitral valve: Calcified annulus. Doppler: There was no  significant regurgitation.  -------------------------------------------------------------------  Left atrium: The atrium was normal in size.  -------------------------------------------------------------------  Right ventricle: The cavity size was normal. Systolic function was  normal.  -------------------------------------------------------------------  Pulmonic valve: Poorly visualized. The valve appears to be  grossly normal. Doppler: There was trivial regurgitation.  -------------------------------------------------------------------  Tricuspid valve: The valve appears to be grossly normal.  Doppler: There was physiologic regurgitation.    -------------------------------------------------------------------  Pulmonary artery: Systolic pressure could not be accurately  estimated.  -------------------------------------------------------------------  Right atrium: The atrium was normal in size.  -------------------------------------------------------------------  Pericardium: A prominent pericardial fat pad was present. There  was no pericardial effusion.  -------------------------------------------------------------------  Systemic veins:  Inferior vena cava: The vessel was normal in size. The  respirophasic diameter changes were in the normal range (>= 50%),  consistent with normal central venous pressure.  -------------------------------------------------------------------  Measurements  Left ventricle Value Reference  LV ID, ED, PLAX chordal (L) 39.5 mm 43 - 52  LV ID, ES, PLAX chordal 24.4 mm 23 - 38  LV fx shortening, PLAX chordal 38 % >=29  LV PW thickness, ED 11.6 mm ----------  IVS/LV PW ratio, ED 0.87 <=1.3  Stroke volume, 2D 104 ml ----------  Stroke volume/bsa, 2D 50 ml/m^2 ----------  LV ejection fraction, 1-p A4C 73 % ----------  LV end-diastolic volume, 2-p 94 ml ----------  LV end-systolic volume, 2-p 28 ml ----------  LV ejection fraction, 2-p 70 % ----------  Stroke volume, 2-p 66 ml ----------  LV end-diastolic volume/bsa, 2-p 46 ml/m^2 ----------  LV end-systolic volume/bsa, 2-p 13 ml/m^2 ----------  Stroke volume/bsa, 2-p 32 ml/m^2 ----------  LV e&', lateral 9.25 cm/s ----------  LV E/e&', lateral 6.81 ----------  LV e&', medial 7.51 cm/s ----------  LV E/e&', medial 8.39 ----------  LV e&', average 8.38 cm/s ----------  LV E/e&', average 7.52 ----------  Ventricular septum Value Reference  IVS thickness, ED 10.1 mm ----------  LVOT Value Reference  LVOT ID, S 20 mm ----------  LVOT area 3.14 cm^2 ----------  LVOT peak velocity, S 101 cm/s ----------  LVOT mean velocity, S 70.4 cm/s  ----------  LVOT VTI, S 33.1 cm ----------  LVOT peak gradient, S 4 mm Hg ----------  Aortic valve Value Reference  Aortic valve peak velocity, S 456 cm/s ----------  Aortic valve mean velocity, S 312 cm/s ----------  Aortic valve VTI, S 115 cm ----------  Aortic mean gradient, S 48 mm Hg ----------  Aortic peak gradient, S 83 mm Hg ----------  VTI ratio, LVOT/AV 0.29 ----------  Aortic valve area, VTI 0.9 cm^2 ----------  Aortic valve area/bsa, VTI 0.44 cm^2/m^2 ----------  Velocity ratio, peak, LVOT/AV 0.22 ----------  Aortic valve area, peak velocity 0.7 cm^2 ----------  Aortic valve area/bsa, peak 0.34 cm^2/m^2 ----------  velocity  Velocity ratio, mean, LVOT/AV 0.23 ----------  Aortic valve area, mean velocity 0.71 cm^2 ----------  Aortic valve area/bsa, mean 0.34 cm^2/m^2 ----------  velocity  Aortic regurg pressure half-time 578 ms ----------  Aorta Value Reference  Aortic root ID, ED 31 mm ----------  Left atrium Value Reference  LA ID, A-P, ES 34 mm ----------  LA ID/bsa, A-P 1.65  cm/m^2 <=2.2  LA volume, ES, 1-p A4C 30.6 ml ----------  LA volume/bsa, ES, 1-p A4C 14.8 ml/m^2 ----------  LA volume, ES, 1-p A2C 47.7 ml ----------  LA volume/bsa, ES, 1-p A2C 23.1 ml/m^2 ----------  Mitral valve Value Reference  Mitral E-wave peak velocity 63 cm/s ----------  Mitral A-wave peak velocity 72.2 cm/s ----------  Mitral deceleration time (H) 349 ms 150 - 230  Mitral E/A ratio, peak 0.9 ----------  Right atrium Value Reference  RA ID, S-I, ES, A4C (H) 58.2 mm 34 - 49  RA area, ES, A4C 17.3 cm^2 8.3 - 19.5  RA volume, ES, A/L 44.5 ml ----------  RA volume/bsa, ES, A/L 21.5 ml/m^2 ----------  Systemic veins Value Reference  Estimated CVP 3 mm Hg ----------  Right ventricle Value Reference  RV ID, ED, PLAX 24.4 mm 19 - 38  TAPSE 24.9 mm ----------  RV s&', lateral, S 11.2 cm/s ----------  Legend:  (L) and (H) mark values outside specified reference range.    -------------------------------------------------------------------  Prepared and Electronically Authenticated by  Nona Dell, M.D.  [id number]:51:27 .read  Assessment / Plan:     Patient with symptomatic critical aortic stenosis, with anginal symptoms, history of myocardial infarction in December 2017. Patient has recent dental checkup I recommend to the patient that we proceed with aortic valve replacement, possible coronary artery bypass grafting pending on status of updated cardiac catheterization.  I have reviewed the patient's cine films and information with Dr. Excell Seltzer who agrees the patient is not a high risk surgical patient and would not be a candidate for TVAR. Since the cardiac catheterization was done in December of last year, and although there were no high-grade lesions the patient did have coronary artery disease and may have been underestimated by the report. Repeat cardiac catheterization will be done and planned next week and  to proceed with aortic valve placement July 30 . I discussed with the patient and his wife the pros and cons of both mechanical and tissue valve last week and again today . He wishes to have tissue tissue valve.  Echocardiogram the patient likely has a bicuspid aortic valve, CT scan of the chest recently does not show any evidence of enlargement of the  ascending aorta.    Delight Ovens MD      [address] [place].[address] [place] [address] Office [phone]   Beeper [phone]  08/24/2016 4:34 PM

## 2016-08-26 ENCOUNTER — Telehealth: Payer: Self-pay

## 2016-08-26 NOTE — Telephone Encounter (Signed)
[facility] scheduled for:  08/29/2016 @ 1030 Verified arrival time and place:  [facility] @ 0800 Confirmed AM meds to be taken pre-cath with sip of water:   Pt to take ASA and Plavix prior to arrival.  Notified Pt he can hold his lisinopril/hctz am of procedure Confirmed patient has responsible person to drive home post procedure and observe patient for 24 hours:  wife Addl concerns:  None noted

## 2016-08-29 ENCOUNTER — Ambulatory Visit (HOSPITAL_COMMUNITY)
Admission: RE | Admit: 2016-08-29 | Discharge: 2016-08-29 | Disposition: A | Discharge status: Home / Self Care | Payer: BLUE CROSS/BLUE SHIELD | Source: Ambulatory Visit | Attending: Cardiovascular Disease | Admitting: Cardiovascular Disease

## 2016-08-29 ENCOUNTER — Encounter (HOSPITAL_COMMUNITY): Payer: Self-pay | Admitting: Cardiovascular Disease

## 2016-08-29 ENCOUNTER — Encounter (HOSPITAL_COMMUNITY)
Admission: RE | Disposition: A | Discharge status: Home / Self Care | Payer: Self-pay | Source: Ambulatory Visit | Attending: Cardiovascular Disease

## 2016-08-29 DIAGNOSIS — Z7951 Long term (current) use of inhaled steroids: Secondary | ICD-10-CM | POA: Diagnosis not present

## 2016-08-29 DIAGNOSIS — J449 Chronic obstructive pulmonary disease, unspecified: Secondary | ICD-10-CM | POA: Diagnosis not present

## 2016-08-29 DIAGNOSIS — I714 Abdominal aortic aneurysm, without rupture: Secondary | ICD-10-CM | POA: Insufficient documentation

## 2016-08-29 DIAGNOSIS — I10 Essential (primary) hypertension: Secondary | ICD-10-CM | POA: Diagnosis not present

## 2016-08-29 DIAGNOSIS — E785 Hyperlipidemia, unspecified: Secondary | ICD-10-CM | POA: Insufficient documentation

## 2016-08-29 DIAGNOSIS — I252 Old myocardial infarction: Secondary | ICD-10-CM | POA: Insufficient documentation

## 2016-08-29 DIAGNOSIS — Z01810 Encounter for preprocedural cardiovascular examination: Secondary | ICD-10-CM | POA: Insufficient documentation

## 2016-08-29 DIAGNOSIS — Z7902 Long term (current) use of antithrombotics/antiplatelets: Secondary | ICD-10-CM | POA: Diagnosis not present

## 2016-08-29 DIAGNOSIS — Z87891 Personal history of nicotine dependence: Secondary | ICD-10-CM | POA: Insufficient documentation

## 2016-08-29 DIAGNOSIS — Z7982 Long term (current) use of aspirin: Secondary | ICD-10-CM | POA: Insufficient documentation

## 2016-08-29 DIAGNOSIS — I2510 Atherosclerotic heart disease of native coronary artery without angina pectoris: Secondary | ICD-10-CM | POA: Diagnosis not present

## 2016-08-29 DIAGNOSIS — I352 Nonrheumatic aortic (valve) stenosis with insufficiency: Secondary | ICD-10-CM | POA: Insufficient documentation

## 2016-08-29 DIAGNOSIS — I2584 Coronary atherosclerosis due to calcified coronary lesion: Secondary | ICD-10-CM | POA: Diagnosis not present

## 2016-08-29 DIAGNOSIS — I350 Nonrheumatic aortic (valve) stenosis: Secondary | ICD-10-CM | POA: Diagnosis not present

## 2016-08-29 HISTORY — DX: Nonrheumatic aortic (valve) stenosis: I35.0

## 2016-08-29 HISTORY — PX: RIGHT/LEFT HEART CATH AND CORONARY ANGIOGRAPHY: CATH118266

## 2016-08-29 LAB — POCT I-STAT 3, VENOUS BLOOD GAS (G3P V)
Acid-Base Excess: 1 mmol/L (ref 0.0–2.0)
Bicarbonate: 27.5 mmol/L (ref 20.0–28.0)
O2 Saturation: 70 %
TCO2: 29 mmol/L (ref 0–100)
pCO2, Ven: 51.2 mmHg (ref 44.0–60.0)
pH, Ven: 7.337 (ref 7.250–7.430)
pO2, Ven: 40 mmHg (ref 32.0–45.0)

## 2016-08-29 LAB — POCT I-STAT 3, ART BLOOD GAS (G3+)
Acid-base deficit: 3 mmol/L — ABNORMAL HIGH (ref 0.0–2.0)
Bicarbonate: 24.1 mmol/L (ref 20.0–28.0)
O2 Saturation: 96 %
TCO2: 26 mmol/L (ref 0–100)
pCO2 arterial: 52.3 mmHg — ABNORMAL HIGH (ref 32.0–48.0)
pH, Arterial: 7.272 — ABNORMAL LOW (ref 7.350–7.450)
pO2, Arterial: 94 mmHg (ref 83.0–108.0)

## 2016-08-29 LAB — BASIC METABOLIC PANEL
Anion gap: 9 (ref 5–15)
BUN: 7 mg/dL (ref 6–20)
CO2: 28 mmol/L (ref 22–32)
Calcium: 9.3 mg/dL (ref 8.9–10.3)
Chloride: 102 mmol/L (ref 101–111)
Creatinine, Ser: 0.72 mg/dL (ref 0.61–1.24)
GFR calc Af Amer: >60 mL/min (ref >60)
GFR calc non Af Amer: >60 mL/min (ref >60)
Glucose, Bld: 112 mg/dL — ABNORMAL HIGH (ref 65–99)
Potassium: 4.4 mmol/L (ref 3.5–5.1)
Sodium: 139 mmol/L (ref 135–145)

## 2016-08-29 LAB — PROTIME-INR
INR: 1.06
Prothrombin Time: 13.9 seconds (ref 11.4–15.2)

## 2016-08-29 LAB — CBC
HCT: 41.8 % (ref 39.0–52.0)
Hemoglobin: 14.7 g/dL (ref 13.0–17.0)
MCH: 30.6 pg (ref 26.0–34.0)
MCHC: 35.2 g/dL (ref 30.0–36.0)
MCV: 87.1 fL (ref 78.0–100.0)
Platelets: 189 10*3/uL (ref 150–400)
RBC: 4.8 MIL/uL (ref 4.22–5.81)
RDW: 13.5 % (ref 11.5–15.5)
WBC: 6.2 10*3/uL (ref 4.0–10.5)

## 2016-08-29 SURGERY — RIGHT/LEFT HEART CATH AND CORONARY ANGIOGRAPHY
Anesthesia: LOCAL

## 2016-08-29 MED ORDER — IOPAMIDOL (ISOVUE-370) INJECTION 76%
INTRAVENOUS | Status: AC
  Filled 2016-08-29: qty 100

## 2016-08-29 MED ORDER — SODIUM CHLORIDE 0.9 % IV SOLN: 250.0000 mL | INTRAVENOUS | Status: DC | PRN

## 2016-08-29 MED ORDER — MIDAZOLAM HCL 2 MG/2ML IJ SOLN
INTRAMUSCULAR | Status: DC | PRN
  Administered 2016-08-29: 2 mg via INTRAVENOUS

## 2016-08-29 MED ORDER — HEPARIN (PORCINE) IN NACL 2-0.9 UNIT/ML-% IJ SOLN
INTRAMUSCULAR | Status: AC | PRN
  Administered 2016-08-29: 1000 mL

## 2016-08-29 MED ORDER — MIDAZOLAM HCL 2 MG/2ML IJ SOLN
INTRAMUSCULAR | Status: AC
  Filled 2016-08-29: qty 2

## 2016-08-29 MED ORDER — ACETAMINOPHEN 325 MG PO TABS: 650.0000 mg | ORAL_TABLET | ORAL | Status: DC | PRN

## 2016-08-29 MED ORDER — HEPARIN (PORCINE) IN NACL 2-0.9 UNIT/ML-% IJ SOLN
INTRAMUSCULAR | Status: AC
  Filled 2016-08-29: qty 1000

## 2016-08-29 MED ORDER — HEPARIN SODIUM (PORCINE) 1000 UNIT/ML IJ SOLN
INTRAMUSCULAR | Status: AC
  Filled 2016-08-29: qty 1

## 2016-08-29 MED ORDER — LIDOCAINE HCL (PF) 1 % IJ SOLN
INTRAMUSCULAR | Status: DC | PRN
  Administered 2016-08-29: 5 mL

## 2016-08-29 MED ORDER — SODIUM CHLORIDE 0.9% FLUSH: 3.0000 mL | Freq: Two times a day (BID) | INTRAVENOUS | Status: DC

## 2016-08-29 MED ORDER — LIDOCAINE HCL (PF) 1 % IJ SOLN
INTRAMUSCULAR | Status: AC
  Filled 2016-08-29: qty 30

## 2016-08-29 MED ORDER — CLOPIDOGREL BISULFATE 75 MG PO TABS: 75.0000 mg | ORAL_TABLET | ORAL | Status: DC

## 2016-08-29 MED ORDER — SODIUM CHLORIDE 0.9 % WEIGHT BASED INFUSION: 1.0000 mL/kg/h | INTRAVENOUS | Status: DC

## 2016-08-29 MED ORDER — SODIUM CHLORIDE 0.9% FLUSH: 3.0000 mL | INTRAVENOUS | Status: DC | PRN

## 2016-08-29 MED ORDER — IOPAMIDOL (ISOVUE-370) INJECTION 76%
INTRAVENOUS | Status: DC | PRN
  Administered 2016-08-29: 90 mL via INTRA_ARTERIAL

## 2016-08-29 MED ORDER — HEPARIN SODIUM (PORCINE) 1000 UNIT/ML IJ SOLN
INTRAMUSCULAR | Status: DC | PRN
  Administered 2016-08-29: 5000 [IU] via INTRAVENOUS

## 2016-08-29 MED ORDER — SODIUM CHLORIDE 0.9 % WEIGHT BASED INFUSION
3.0000 mL/kg/h | INTRAVENOUS | Status: AC
  Administered 2016-08-29: 3 mL/kg/h via INTRAVENOUS

## 2016-08-29 MED ORDER — FENTANYL CITRATE (PF) 100 MCG/2ML IJ SOLN
INTRAMUSCULAR | Status: DC | PRN
  Administered 2016-08-29: 25 ug via INTRAVENOUS

## 2016-08-29 MED ORDER — VERAPAMIL HCL 2.5 MG/ML IV SOLN
INTRAVENOUS | Status: AC
  Filled 2016-08-29: qty 2

## 2016-08-29 MED ORDER — FENTANYL CITRATE (PF) 100 MCG/2ML IJ SOLN
INTRAMUSCULAR | Status: AC
  Filled 2016-08-29: qty 2

## 2016-08-29 MED ORDER — ONDANSETRON HCL 4 MG/2ML IJ SOLN: 4.0000 mg | Freq: Four times a day (QID) | INTRAMUSCULAR | Status: DC | PRN

## 2016-08-29 MED ORDER — ASPIRIN 81 MG PO CHEW: 81.0000 mg | CHEWABLE_TABLET | ORAL | Status: DC

## 2016-08-29 SURGICAL SUPPLY — 12 items
CATH 5FR JL3.5 JR4 ANG PIG MP (CATHETERS) ×1 IMPLANT
CATH BALLN WEDGE 5F 110CM (CATHETERS) ×1 IMPLANT
CATH INFINITI 5FR JL4 (CATHETERS) ×1 IMPLANT
DEVICE RAD COMP TR BAND LRG (VASCULAR PRODUCTS) ×1 IMPLANT
GLIDESHEATH SLEND SS 6F .021 (SHEATH) ×1 IMPLANT
GUIDEWIRE INQWIRE 1.5J.035X260 (WIRE) IMPLANT
INQWIRE 1.5J .035X260CM (WIRE) ×2
KIT HEART LEFT (KITS) ×2 IMPLANT
PACK CARDIAC CATHETERIZATION (CUSTOM PROCEDURE TRAY) ×2 IMPLANT
SHEATH GLIDE SLENDER 4/5FR (SHEATH) ×1 IMPLANT
TRANSDUCER W/STOPCOCK (MISCELLANEOUS) ×2 IMPLANT
TUBING CIL FLEX 10 FLL-RA (TUBING) ×2 IMPLANT

## 2016-08-29 NOTE — H&P (View-Only) (Signed)
[address]       [place] [id number]             [phone]                    Daniel BoutonKenneth D Kalmbach [facility] Medical Record #[id number] Date of Birth: 10/01/1950  Referring: Laqueta LindenKoneswaran, Suresh A, MD Primary Care: Babs SciaraLuking, [name] A, MD  Chief Complaint:    Chief Complaint  Patient presents with  . Aortic Stenosis    Further discuss surgery scheduled for 09/05/16, Cardic Cath scheduled for 08/29/16    History of Present Illness:    Daniel BoutonKenneth D [name] 66 y.o. male is seen in the office  today for Evaluation of aortic stenosis. In late December 2017 the patient went to the emergency room with pneumonia symptoms, cough shortness of breath and chest discomfort. [facility] and when troponins were elevated to 1.640 [facility] and [facility] catheterization. On exam and echocardiogram he was noted to have critical aortic stenosis. In addition he has a 4.7 cm abdominal aortic aneurysm last measured on 07/28/2016 ultrasound.  The patient comes in now noting increasing symptoms he notes increasing chest discomfort and shortness of breath with exertion over the past several months, he denies syncope or near-syncope.  He has a previous smoker but quit 15 years ago   Current Activity/ Functional Status:  Patient is independent with mobility/ambulation, transfers, ADL's, IADL's.   Zubrod Score: At the time of surgery this patient's most appropriate activity status/level should be described as: []     0    Normal activity, no symptoms [x]     1    Restricted in physical strenuous activity but ambulatory, able to do out light work []     2    Ambulatory and capable of self care, unable to do work activities, up and about               >50 % of waking hours                              []     3    Only limited self care, in bed greater than 50% of waking hours []     4    Completely disabled, no self care, confined to bed or chair []     5     Moribund   Past Medical History:  Diagnosis Date  . AAA (abdominal aortic aneurysm) (HCC)   . COPD (chronic obstructive pulmonary disease) (HCC)   . Fatty liver   . Hyperlipidemia   . Hypertension   . White coat hypertension     Past Surgical History:  Procedure Laterality Date  . ANKLE SURGERY Right 2009  . APPENDECTOMY    . CARDIAC CATHETERIZATION N/A 02/02/2016   Procedure: Left Heart Cath and Coronary Angiography;  Surgeon: Rinaldo CloudMohan Harwani, MD;  [facility];  Service: Cardiovascular;  Laterality: N/A;  . COLONOSCOPY  06/22/2010   OZH:[id number]:Diminutive rectal and right colon polyps removed remainder rectum and colon appared normal  . COLONOSCOPY WITH PROPOFOL N/A 05/15/2014   Procedure: COLONOSCOPY WITH PROPOFOL;  Surgeon: Corbin Adeobert M Rourk, MD;  Location: [facility] ORS;  Service: Endoscopy;  Laterality: N/A;  In cecum @ [id number], out @ 0937, withdrawal time 19 minutes  . POLYPECTOMY N/A 05/15/2014   Procedure: POLYPECTOMY;  Surgeon: Corbin Adeobert M Rourk, MD;  Location: [facility]  ORS;  Service: Endoscopy;  Laterality: N/A;    Family History  Problem Relation Age of Onset  . Hypertension Sister   . AAA (abdominal aortic aneurysm) Brother   . Colon cancer Neg Hx   . Liver disease Neg Hx     Social History   Social History  . Marital status: Married    Spouse name: N/A  . Number of children: N/A  . Years of education: N/A   Occupational History  . Not on file.   Social History Main Topics  . Smoking status: Former Smoker    Packs/day: 1.50    Years: 20.00    Types: Cigarettes    Quit date: 04/16/2002  . Smokeless tobacco: Never Used  . Alcohol use 3.0 oz/week    5 Cans of beer per week     Comment: per pt  . Drug use: No  . Sexual activity: No   Other Topics Concern  . Not on file   Social History Narrative  . No narrative on file    History  Smoking Status  . Former Smoker  . Packs/day: 1.50  . Years: 20.00  . Types: Cigarettes  . Quit date: 04/16/2002  Smokeless Tobacco   . Never Used    History  Alcohol Use  . 3.0 oz/week  . 5 Cans of beer per week    Comment: per pt     No Known Allergies  Current Outpatient Prescriptions  Medication Sig Dispense Refill  . ADVAIR DISKUS 100-50 MCG/DOSE AEPB INHALE 1 DOSE BY MOUTH TWICE DAILY 60 each 0  . albuterol (VENTOLIN HFA) 108 (90 Base) MCG/ACT inhaler INHALE TWO PUFFS BY MOUTH EVERY 4 HOURS AS NEEDED 18 each 5  . aspirin 81 MG tablet Take 81 mg by mouth at bedtime.     [name] atorvastatin (LIPITOR) 40 MG tablet Take 1 tablet (40 mg total) by mouth daily. 30 tablet 5  . clopidogrel (PLAVIX) 75 MG tablet Take 1 tablet (75 mg total) by mouth daily with breakfast. 30 tablet 5  . fexofenadine (ALLEGRA) 180 MG tablet Take 180 mg by mouth daily.    . fish oil-omega-3 fatty acids 1000 MG capsule Take 1 g by mouth 2 (two) times daily.     [name] lisinopril-hydrochlorothiazide (ZESTORETIC) 20-12.5 MG tablet Take 1 tablet by mouth daily. 30 tablet 5  . metoprolol tartrate (LOPRESSOR) 25 MG tablet Take 0.5 tablets (12.5 mg total) by mouth 2 (two) times daily. 30 tablet 5  . mometasone (NASONEX) 50 MCG/ACT nasal spray Place 2 sprays into the nose daily as needed (for allergies). 17 g 5  . Multiple Vitamin (MULTIVITAMIN) tablet Take 1 tablet by mouth daily.    . nitroGLYCERIN (NITROSTAT) 0.4 MG SL tablet Place 1 tablet (0.4 mg total) under the tongue every 5 (five) minutes x 3 doses as needed for chest pain. 25 tablet 12  . tiotropium (SPIRIVA HANDIHALER) 18 MCG inhalation capsule Place 1 capsule (18 mcg total) into inhaler and inhale daily. 30 capsule 5  . vitamin C (ASCORBIC ACID) 500 MG tablet Take 500 mg by mouth daily. Reported on 07/16/2015     No current facility-administered medications for this visit.     Pertinent items are noted in HPI.   Review of Systems:     Cardiac Review of Systems: Y or N  Chest Pain [  y  ]  Resting SOB [ n  ] Exertional SOB  Cove.Etienne  ]  Orthopnea [ n ]  Pedal Edema [ n  ]    Palpitations [   n] Syncope  [ n ]   Presyncope [ n  ]  General Review of Systems: [Y] = yes [  ]=no Constitional: recent weight change [  ];  Wt loss over the last 3 months [   ] anorexia [  ]; fatigue [  ]; nausea [  ]; night sweats [  ]; fever [  n]; or chills [ n ];          Dental: poor dentition[  ]; Last Dentist visit:   Eye : blurred vision [  ]; diplopia [   ]; vision changes [  ];  Amaurosis fugax[  ]; Resp: cough [  ];  wheezing[n  ];  hemoptysis[n  ]; shortness of breath[  ]; paroxysmal nocturnal dyspnea[  ]; dyspnea on exertion[  ]; or orthopnea[  ];  GI:  gallstones[  ], vomiting[  ];  dysphagia[  ]; melena[  ];  hematochezia [  ]; heartburn[  ];   Hx of  Colonoscopy[y  ]; GU: kidney stones [  ]; hematuria[  ];   dysuria [  ];  nocturia[  ];  history of     obstruction [  ]; urinary frequency [  ]             Skin: rash, swelling[  ];, hair loss[  ];  peripheral edema[  ];  or itching[  ]; Musculosketetal: myalgias[ n ];  joint swelling[  ];  joint erythema[  ];  joint pain[  ];  back pain[  ];  Heme/Lymph: bruising[  ];  bleeding[  ];  anemia[  ];  Neuro: TIA[ n ];  headaches[n  ];  stroke[n  ];  vertigo[  ];  seizures[  ];   paresthesias[  ];  difficulty walking[ n ];  Psych:depression[  ]; anxiety[  ];  Endocrine: diabetes[  ];  thyroid dysfunction[  ];  Immunizations: Flu up to date Cove.Etienne  ]; Pneumococcal up to date Cove.Etienne  ];  Other:  Physical Exam: BP 129/88   Pulse 71   Resp 20   Ht 5\' 9"  (1.753 m)   Wt 190 lb (86.2 kg)   SpO2 94% Comment: RA  BMI 28.06 kg/m   PHYSICAL EXAMINATION: General appearance: alert, cooperative, appears stated age and no distress Head: Normocephalic, without obvious abnormality, atraumatic Neck: no adenopathy, no carotid bruit, no JVD, supple, symmetrical, trachea midline and thyroid not enlarged, symmetric, no tenderness/mass/nodules Lymph nodes: Cervical, supraclavicular, and axillary nodes normal. Resp: clear to auscultation bilaterally Back: symmetric, no  curvature. ROM normal. No CVA tenderness. Cardio: systolic murmur: early systolic 3/6, crescendo at lower left sternal border GI: soft, non-tender; bowel sounds normal; no masses,  no organomegaly Extremities: extremities normal, atraumatic, no cyanosis or edema and Homans sign is negative, no sign of DVT Neurologic: Grossly normal  Diagnostic Studies & Laboratory data:     Recent Radiology Findings:   Ct Angio Chest Aorta W &/or Wo Contrast  Result Date: 08/13/2016 CLINICAL DATA:  Aortic stenosis, aortic regurgitation and bicuspid aortic valve. History of nonobstructive coronary artery disease. History of 4.7 cm abdominal aortic aneurysm. EXAM: CT ANGIOGRAPHY CHEST WITH CONTRAST TECHNIQUE: Multidetector CT imaging of the chest was performed using the standard protocol during bolus administration of intravenous contrast. Multiplanar CT image reconstructions and MIPs were obtained to evaluate the vascular anatomy. CONTRAST:  100 mL Isovue 370 IV COMPARISON:  Prior CTA of the  chest on 01/12/2015 FINDINGS: Cardiovascular: Aorta: The aortic valve is heavily calcified. By a non-gated CTA, it is not entirely possible to delineate bicuspid versus tricuspid anatomy. Based on configuration of aortic sinuses, it is suspected that the valve is likely bicuspid. There is no associated aneurysmal disease of the thoracic aorta. The aorta measures 3 cm at the sinuses of Valsalva. The ascending thoracic aorta measures 3.7 cm in greatest diameter. The proximal arch measures 3 cm. The distal arch measures 2.9 cm. The descending thoracic aorta measures 2.5 cm. No evidence of aortic dissection. Proximal great vessels show normal patency. There is calcified plaque in the left subclavian artery which approaches the vertebral artery origin but does not appear to cause significant vertebral artery stenosis. Visualized upper abdominal aorta demonstrates scattered plaque. Proximal celiac axis demonstrates approximately 25-30%  stenosis. The origin of the superior mesenteric artery demonstrates minimal plaque without significant stenosis. Aneurysmal disease of the abdominal aorta is not visualized on this study. Cardiac/coronary: The heart size is normal. No overt cardiac chamber dilatation. Thinned myocardium at the left ventricular apex may relate to prior infarction. No evidence to suggest ventricular aneurysm. Coronary atherosclerosis noted with calcified plaque in the distribution of the LAD as well as the RCA. No pericardial fluid identified. Pulmonary: Central pulmonary arteries are well opacified and show normal caliber without filling defect. Pulmonary venous anatomy is normal. The left atrial appendage is normally patent. Mediastinum/Nodes: No enlarged mediastinal, hilar, or axillary lymph nodes. Thyroid gland, trachea, and esophagus demonstrate no significant findings. Lungs/Pleura: There is some degree of emphysematous disease, primarily affecting the upper lung zones and more prominently in the right lung compared to the left. Some scattered areas of mild parenchymal scarring are noted bilaterally. There is no evidence of pulmonary edema, consolidation, pneumothorax, nodule or pleural fluid. Upper Abdomen: No acute abnormality. Musculoskeletal: No chest wall abnormality. No acute or significant osseous findings. Review of the MIP images confirms the above findings. IMPRESSION: 1. Heavily calcified aortic valve with likely bicuspid anatomy. No evidence of associated aneurysmal disease of the thoracic aorta with maximal caliber of the ascending thoracic aorta of 3.7 cm. 2. Coronary atherosclerosis with calcified plaque noted in the distribution of the LAD and RCA. Thinned myocardium at the left ventricular apex may relate to prior infarction. 3. Emphysematous lung disease without acute pulmonary findings. Electronically Signed   By: Irish Lack M.D.   On: 08/13/2016 08:55     I have independently reviewed the above  radiologic studies.   Procedures   Left Heart Cath and Coronary Angiography  Conclusion    Prox RCA lesion, 40 %stenosed.  Mid RCA lesion, 30 %stenosed.  Ost Cx to Prox Cx lesion, 40 %stenosed.  Prox LAD lesion, 20 %stenosed.  Mid LAD lesion, 40 %stenosed.    Indications   Acute non Q wave myocardial infarction (HCC) [I21.4 (ICD-10-CM)]  Procedural Details/Technique   Technical Details After obtaining the informed consent patient was brought to the Cath Lab and was placed on fluoroscopy table. Right groin was prepped and draped in usual fashion. 1% Xylocaine was used for local anesthesia in the right groin. Patient received 1 mg of Versed and 25 g of IV fentanyl for conscious sedation. Patient was monitored during the conscious sedation total conscious sedation time was 49 minutes. With the help of thinwall needle 5 French artery sheath was placed. Sheath was aspirated and flushed. Next 5 French left Judkins catheter was advanced over the wire under fluoroscopic guidance up to the ascending aorta  wire was pulled out the catheter was aspirated and connected to the manifold cath was further advanced and engaged into left coronary ostium multiple views of the left system were taken next the catheter was disengaged and was pulled out over the wire and was replaced with 5 French right Judkins catheter which was advanced over the wire under fluoroscopic guidance up to the ascending aorta wire was pulled out the cath was aspirated and connected to the manifold cath was further advanced and engaged into right coronary ostium multiple views of the right system were taken next the catheter was disengaged and was pulled out over the wire and was replaced with 5 French angled pigtail catheter which was advanced over the wire under fluoroscopic guidance up to the ascending aorta wire was pulled out the catheter was aspirated and connected to the manifold catheter was further advanced and attempted to  cross the aortic valve without success this catheter was then switched to straight pigtail catheter which was advanced over the wire under fluoroscopic guidance up to the ascending aorta wire was pulled out the catheter was aspirated and connected to the manifold cath was further attempted to advance across aortic valve without success this catheter was switched to right Judkins catheter which was also unsuccessful to cross the aortic valve. Patient tolerated the procedure well there were no complications Finding LV not done Left main has 20-30% distal stenosis LAD has 20-30% proximal and mid sequential stenosis and 40% bifurcation stenosis with diagonal to diagonal 1 and 2 were patent Ramus was very very small which was patent Left circumflex as 30-40% proximal stenosis with haziness with TIMI 3 distal flow OM1 was very small which was diffusely diseased distally OM 2 was moderate size which was patent RCA has proximal and mid 30-40% stenosis PDA and PLV branches were patent. Patient tolerated procedure well there no complications Plan We will get 2-D echo to evaluate LV systolic function and wall motion and rule out aortic stenosis.   Estimated blood loss <50 mL.  During this procedure the patient was administered the following to achieve and maintain moderate conscious sedation: Versed 1 mg, Fentanyl 25 mcg, while the patient's heart rate, blood pressure, and oxygen saturation were continuously monitored. The period of conscious sedation was 49 minutes, of which I was present face-to-face 100% of this time.    Coronary Findings   Dominance: Right  Left Anterior Descending  Prox LAD lesion, 20% stenosed.  Mid LAD lesion, 40% stenosed.  Ramus Intermedius  Vessel is small.  Left Circumflex  Ost Cx to Prox Cx lesion, 40% stenosed.  First Obtuse Marginal Branch  Vessel is small in size.  Right Coronary Artery  Prox RCA lesion, 40% stenosed.  Mid RCA lesion, 30% stenosed.  Coronary  Diagrams   Diagnostic Diagram       Implants          Hemo Data    Most Recent Value  AO Systolic Pressure 111 mmHg  AO Diastolic Pressure 73 mmHg  AO Mean 89 mmHg   I have independently reviewed the above  cath films and reviewed the findings with the  patient .   Recent Lab Findings: Lab Results  Component Value Date   WBC 9.8 04/14/2016   HGB 15.7 04/14/2016   HCT 48.1 04/14/2016   PLT 166 04/14/2016   GLUCOSE 96 08/05/2016   CHOL 136 05/16/2016   TRIG 225 (H) 05/16/2016   HDL 35 (L) 05/16/2016   LDLCALC 56 05/16/2016  ALT 19 05/16/2016   AST 21 05/16/2016   NA 136 08/05/2016   K 4.4 08/05/2016   CL 100 08/05/2016   CREATININE 0.72 08/05/2016   BUN 14 08/05/2016   CO2 27 08/05/2016   INR 1.17 02/01/2016   HGBA1C 5.6 02/01/2016    ECHO:  [facility]  [address]  [place], [place] [address]  [phone]  -------------------------------------------------------------------  Transthoracic Echocardiography  Patient: [name], [name]  MR #: [id number]  Study Date: 06/22/2016  Gender: M  Age: 66  Height: 175.3 cm  Weight: 86.2 kg  BSA: 2.07 m^2  Pt. Status:  Room:  SONOGRAPHER [facility]  ATTENDING Prentice Docker, MD  ORDERING Prentice Docker, MD  REFERRING Prentice Docker, MD  PERFORMING [place]  cc:  -------------------------------------------------------------------  LV EF: 60% - 65%  -------------------------------------------------------------------  Indications: Aortic stenosis 424.1.  -------------------------------------------------------------------  History: PMH: Former Smoker. Aortic valve disease. Chronic  obstructive pulmonary disease. PMH: Myocardial infarction. Risk  factors: Dyslipidemia.  -------------------------------------------------------------------  Study Conclusions  - Left ventricle: The cavity size was normal. Wall thickness was  increased in a pattern of mild LVH. Systolic  function was normal.  The estimated ejection fraction was in the range of 60% to 65%.  Wall motion was normal; there were no regional wall motion  abnormalities. Left ventricular diastolic function parameters  were normal for the patient&'s age.  - Aortic valve: Leaflet structure difficult to identify, would  suspect bicuspid. There was severe stenosis. There was mild  regurgitation. Mean gradient (S): 48 mm Hg. Peak gradient (S): 83  mm Hg. VTI ratio of LVOT to aortic valve: 0.29. Valve area (VTI):  0.9 cm^2.  - Mitral valve: Calcified annulus.  - Right atrium: Central venous pressure (est): 3 mm Hg.  - Tricuspid valve: There was physiologic regurgitation.  - Pulmonary arteries: Systolic pressure could not be accurately  estimated.  - Pericardium, extracardiac: A prominent pericardial fat pad was  present.  Impressions:  - Mild LVH with LVEF 60-65% and normal diastolic function for age.  Mildly calcified mitral annulus. Severe calcific aortic stenosis  as outlined above with mild aortic regurgitation. Transvalvular  gradients have increased compared to the prior study in December  2017.  -------------------------------------------------------------------  Study data: Comparison was made to the study of 02/03/2016. Study  status: Routine. Procedure: Transthoracic echocardiography.  Image quality was adequate. Study completion: There were no  complications. Transthoracic echocardiography. M-mode,  complete 2D, spectral Doppler, and color Doppler. Birthdate:  Patient birthdate: 02/07/1951. Age: Patient is 66 yr old. Sex:  Gender: male. BMI: 28 kg/m^2. Blood pressure: 142/81  Patient status: Outpatient. Study date: Study date: 06/22/2016.  Study time: 11:24 AM. Location: Echo laboratory.  -------------------------------------------------------------------  -------------------------------------------------------------------  Left ventricle: The cavity size was normal. Wall thickness  was  increased in a pattern of mild LVH. Systolic function was normal.  The estimated ejection fraction was in the range of 60% to 65%.  Wall motion was normal; there were no regional wall motion  abnormalities. Left ventricular diastolic function parameters were  normal for the patient&'s age.  -------------------------------------------------------------------  Aortic valve: Severely calcified leaflets. Leaflet structure  difficult to identify, would suspect bicuspid. Doppler: There  was severe stenosis. There was mild regurgitation. VTI ratio  of LVOT to aortic valve: 0.29. Valve area (VTI): 0.9 cm^2. Indexed  valve area (VTI): 0.44 cm^2/m^2. Peak velocity ratio of LVOT to  aortic valve: 0.22. Valve area (Vmax): 0.7 cm^2. Indexed valve area  (Vmax):  0.34 cm^2/m^2. Mean velocity ratio of LVOT to aortic valve:  0.23. Valve area (Vmean): 0.71 cm^2. Indexed valve area (Vmean):  0.34 cm^2/m^2. Mean gradient (S): 48 mm Hg. Peak gradient (S):  83 mm Hg.  -------------------------------------------------------------------  Aorta: Aortic root: The aortic root was normal in size.  -------------------------------------------------------------------  Mitral valve: Calcified annulus. Doppler: There was no  significant regurgitation.  -------------------------------------------------------------------  Left atrium: The atrium was normal in size.  -------------------------------------------------------------------  Right ventricle: The cavity size was normal. Systolic function was  normal.  -------------------------------------------------------------------  Pulmonic valve: Poorly visualized. The valve appears to be  grossly normal. Doppler: There was trivial regurgitation.  -------------------------------------------------------------------  Tricuspid valve: The valve appears to be grossly normal.  Doppler: There was physiologic regurgitation.    -------------------------------------------------------------------  Pulmonary artery: Systolic pressure could not be accurately  estimated.  -------------------------------------------------------------------  Right atrium: The atrium was normal in size.  -------------------------------------------------------------------  Pericardium: A prominent pericardial fat pad was present. There  was no pericardial effusion.  -------------------------------------------------------------------  Systemic veins:  Inferior vena cava: The vessel was normal in size. The  respirophasic diameter changes were in the normal range (>= 50%),  consistent with normal central venous pressure.  -------------------------------------------------------------------  Measurements  Left ventricle Value Reference  LV ID, ED, PLAX chordal (L) 39.5 mm 43 - 52  LV ID, ES, PLAX chordal 24.4 mm 23 - 38  LV fx shortening, PLAX chordal 38 % >=29  LV PW thickness, ED 11.6 mm ----------  IVS/LV PW ratio, ED 0.87 <=1.3  Stroke volume, 2D 104 ml ----------  Stroke volume/bsa, 2D 50 ml/m^2 ----------  LV ejection fraction, 1-p A4C 73 % ----------  LV end-diastolic volume, 2-p 94 ml ----------  LV end-systolic volume, 2-p 28 ml ----------  LV ejection fraction, 2-p 70 % ----------  Stroke volume, 2-p 66 ml ----------  LV end-diastolic volume/bsa, 2-p 46 ml/m^2 ----------  LV end-systolic volume/bsa, 2-p 13 ml/m^2 ----------  Stroke volume/bsa, 2-p 32 ml/m^2 ----------  LV e&', lateral 9.25 cm/s ----------  LV E/e&', lateral 6.81 ----------  LV e&', medial 7.51 cm/s ----------  LV E/e&', medial 8.39 ----------  LV e&', average 8.38 cm/s ----------  LV E/e&', average 7.52 ----------  Ventricular septum Value Reference  IVS thickness, ED 10.1 mm ----------  LVOT Value Reference  LVOT ID, S 20 mm ----------  LVOT area 3.14 cm^2 ----------  LVOT peak velocity, S 101 cm/s ----------  LVOT mean velocity, S 70.4 cm/s  ----------  LVOT VTI, S 33.1 cm ----------  LVOT peak gradient, S 4 mm Hg ----------  Aortic valve Value Reference  Aortic valve peak velocity, S 456 cm/s ----------  Aortic valve mean velocity, S 312 cm/s ----------  Aortic valve VTI, S 115 cm ----------  Aortic mean gradient, S 48 mm Hg ----------  Aortic peak gradient, S 83 mm Hg ----------  VTI ratio, LVOT/AV 0.29 ----------  Aortic valve area, VTI 0.9 cm^2 ----------  Aortic valve area/bsa, VTI 0.44 cm^2/m^2 ----------  Velocity ratio, peak, LVOT/AV 0.22 ----------  Aortic valve area, peak velocity 0.7 cm^2 ----------  Aortic valve area/bsa, peak 0.34 cm^2/m^2 ----------  velocity  Velocity ratio, mean, LVOT/AV 0.23 ----------  Aortic valve area, mean velocity 0.71 cm^2 ----------  Aortic valve area/bsa, mean 0.34 cm^2/m^2 ----------  velocity  Aortic regurg pressure half-time 578 ms ----------  Aorta Value Reference  Aortic root ID, ED 31 mm ----------  Left atrium Value Reference  LA ID, A-P, ES 34 mm ----------  LA ID/bsa, A-P 1.65  cm/m^2 <=2.2  LA volume, ES, 1-p A4C 30.6 ml ----------  LA volume/bsa, ES, 1-p A4C 14.8 ml/m^2 ----------  LA volume, ES, 1-p A2C 47.7 ml ----------  LA volume/bsa, ES, 1-p A2C 23.1 ml/m^2 ----------  Mitral valve Value Reference  Mitral E-wave peak velocity 63 cm/s ----------  Mitral A-wave peak velocity 72.2 cm/s ----------  Mitral deceleration time (H) 349 ms 150 - 230  Mitral E/A ratio, peak 0.9 ----------  Right atrium Value Reference  RA ID, S-I, ES, A4C (H) 58.2 mm 34 - 49  RA area, ES, A4C 17.3 cm^2 8.3 - 19.5  RA volume, ES, A/L 44.5 ml ----------  RA volume/bsa, ES, A/L 21.5 ml/m^2 ----------  Systemic veins Value Reference  Estimated CVP 3 mm Hg ----------  Right ventricle Value Reference  RV ID, ED, PLAX 24.4 mm 19 - 38  TAPSE 24.9 mm ----------  RV s&', lateral, S 11.2 cm/s ----------  Legend:  (L) and (H) mark values outside specified reference range.    -------------------------------------------------------------------  Prepared and Electronically Authenticated by  Nona Dell, M.D.  2018-05-17T08:51:27 .read  Assessment / Plan:     Patient with symptomatic critical aortic stenosis, with anginal symptoms, history of myocardial infarction in December 2017. Patient has recent dental checkup I recommend to the patient that we proceed with aortic valve replacement, possible coronary artery bypass grafting pending on status of updated cardiac catheterization.  I have reviewed the patient's cine films and information with Dr. Excell Seltzer who agrees the patient is not a high risk surgical patient and would not be a candidate for TVAR. Since the cardiac catheterization was done in December of last year, and although there were no high-grade lesions the patient did have coronary artery disease and may have been underestimated by the report. Repeat cardiac catheterization will be done and planned next week and  to proceed with aortic valve placement July 30 . I discussed with the patient and his wife the pros and cons of both mechanical and tissue valve last week and again today . He wishes to have tissue tissue valve.  Echocardiogram the patient likely has a bicuspid aortic valve, CT scan of the chest recently does not show any evidence of enlargement of the  ascending aorta.    Delight Ovens MD      [address] [place].[address] [place] [address] Office [phone]   Beeper [phone]  08/24/2016 4:34 PM

## 2016-08-29 NOTE — Progress Notes (Signed)
Right brachial sheath removed by Quitman LivingsJanice Walker,RN and pressure held x 5 min

## 2016-08-29 NOTE — Interval H&P Note (Signed)
History and Physical Interval Note:  08/29/2016 10:37 AM  Daniel BoutonKenneth D [name]  has presented today for surgery, with the diagnosis of aortic stenosis  The various methods of treatment have been discussed with the patient and family. After consideration of risks, benefits and other options for treatment, the patient has consented to  Procedure(s): Right/Left Heart Cath and Coronary Angiography (N/A) as a surgical intervention .  The patient's history has been reviewed, patient examined, no change in status, stable for surgery.  I have reviewed the patient's chart and labs.  Questions were answered to the patient's satisfaction.     Tonny Bollmanooper, Danaria Larsen

## 2016-08-29 NOTE — Discharge Instructions (Signed)

## 2016-08-30 MED FILL — Verapamil HCl IV Soln 2.5 MG/ML: INTRAVENOUS | Qty: 2 | Status: AC

## 2016-08-31 ENCOUNTER — Other Ambulatory Visit (HOSPITAL_COMMUNITY): Payer: Self-pay

## 2016-09-01 ENCOUNTER — Ambulatory Visit (HOSPITAL_BASED_OUTPATIENT_CLINIC_OR_DEPARTMENT_OTHER)
Admission: RE | Admit: 2016-09-01 | Discharge: 2016-09-01 | Disposition: A | Discharge status: Home / Self Care | Payer: BLUE CROSS/BLUE SHIELD | Source: Ambulatory Visit | Attending: Cardiothoracic Surgery | Admitting: Cardiothoracic Surgery

## 2016-09-01 ENCOUNTER — Ambulatory Visit (HOSPITAL_COMMUNITY)
Admission: RE | Admit: 2016-09-01 | Discharge: 2016-09-01 | Disposition: A | Discharge status: Home / Self Care | Payer: BLUE CROSS/BLUE SHIELD | Source: Ambulatory Visit | Attending: Cardiothoracic Surgery | Admitting: Cardiothoracic Surgery

## 2016-09-01 ENCOUNTER — Other Ambulatory Visit (HOSPITAL_COMMUNITY): Payer: Self-pay

## 2016-09-01 ENCOUNTER — Encounter (HOSPITAL_COMMUNITY)
Admission: RE | Admit: 2016-09-01 | Discharge: 2016-09-01 | Disposition: A | Discharge status: Home / Self Care | Payer: BLUE CROSS/BLUE SHIELD | Source: Ambulatory Visit | Attending: Cardiothoracic Surgery | Admitting: Cardiothoracic Surgery

## 2016-09-01 DIAGNOSIS — J449 Chronic obstructive pulmonary disease, unspecified: Secondary | ICD-10-CM | POA: Diagnosis not present

## 2016-09-01 DIAGNOSIS — I350 Nonrheumatic aortic (valve) stenosis: Secondary | ICD-10-CM | POA: Insufficient documentation

## 2016-09-01 LAB — VAS US DOPPLER PRE CABG
LEFT ECA DIAS: -14 cm/s
LEFT VERTEBRAL DIAS: 14 cm/s
Left CCA dist dias: -15 cm/s
Left CCA dist sys: -68 cm/s
Left CCA prox dias: 18 cm/s
Left CCA prox sys: 100 cm/s
Left ICA dist dias: -23 cm/s
Left ICA dist sys: -72 cm/s
Left ICA prox dias: -27 cm/s
Left ICA prox sys: -92 cm/s
RIGHT ECA DIAS: -10 cm/s
RIGHT VERTEBRAL DIAS: 7 cm/s
Right CCA prox dias: 16 cm/s
Right CCA prox sys: 70 cm/s
Right cca dist sys: -68 cm/s

## 2016-09-01 LAB — CBC
HCT: 42.7 % (ref 39.0–52.0)
Hemoglobin: 14.3 g/dL (ref 13.0–17.0)
MCH: 29.4 pg (ref 26.0–34.0)
MCHC: 33.5 g/dL (ref 30.0–36.0)
MCV: 87.9 fL (ref 78.0–100.0)
Platelets: 189 10*3/uL (ref 150–400)
RBC: 4.86 MIL/uL (ref 4.22–5.81)
RDW: 13.5 % (ref 11.5–15.5)
WBC: 6.2 10*3/uL (ref 4.0–10.5)

## 2016-09-01 LAB — PULMONARY FUNCTION TEST
DL/VA % pred: 55 %
DL/VA: 2.51 ml/min/mmHg/L
DLCO unc % pred: 40 %
DLCO unc: 12.41 ml/min/mmHg
FEF 25-75 Post: 0.34 L/sec
FEF 25-75 Pre: 0.31 L/sec
FEF2575-%Change-Post: 9 %
FEF2575-%Pred-Post: 12 %
FEF2575-%Pred-Pre: 11 %
FEV1-%Change-Post: 1 %
FEV1-%Pred-Post: 32 %
FEV1-%Pred-Pre: 32 %
FEV1-Post: 1.09 L
FEV1-Pre: 1.08 L
FEV1FVC-%Change-Post: -2 %
FEV1FVC-%Pred-Pre: 47 %
FEV6-%Change-Post: 4 %
FEV6-%Pred-Post: 58 %
FEV6-%Pred-Pre: 55 %
FEV6-Post: 2.46 L
FEV6-Pre: 2.36 L
FEV6FVC-%Change-Post: 0 %
FEV6FVC-%Pred-Post: 81 %
FEV6FVC-%Pred-Pre: 81 %
FVC-%Change-Post: 4 %
FVC-%Pred-Post: 70 %
FVC-%Pred-Pre: 68 %
FVC-Post: 3.17 L
FVC-Pre: 3.03 L
Post FEV1/FVC ratio: 35 %
Post FEV6/FVC ratio: 78 %
Pre FEV1/FVC ratio: 35 %
Pre FEV6/FVC Ratio: 78 %
RV % pred: 243 %
RV: 5.54 L
TLC % pred: 131 %
TLC: 8.93 L

## 2016-09-01 LAB — SURGICAL PCR SCREEN
MRSA, PCR: NEGATIVE
Staphylococcus aureus: POSITIVE — AB

## 2016-09-01 LAB — URINALYSIS, ROUTINE W REFLEX MICROSCOPIC
Bilirubin Urine: NEGATIVE
Glucose, UA: NEGATIVE mg/dL
Hgb urine dipstick: NEGATIVE
Ketones, ur: NEGATIVE mg/dL
Leukocytes, UA: NEGATIVE
Nitrite: NEGATIVE
Protein, ur: NEGATIVE mg/dL
Specific Gravity, Urine: 1.005 (ref 1.005–1.030)
pH: 6 (ref 5.0–8.0)

## 2016-09-01 LAB — COMPREHENSIVE METABOLIC PANEL
ALT: 18 U/L (ref 17–63)
AST: 29 U/L (ref 15–41)
Albumin: 4.3 g/dL (ref 3.5–5.0)
Alkaline Phosphatase: 47 U/L (ref 38–126)
Anion gap: 10 (ref 5–15)
BUN: 8 mg/dL (ref 6–20)
CO2: 23 mmol/L (ref 22–32)
Calcium: 9.6 mg/dL (ref 8.9–10.3)
Chloride: 104 mmol/L (ref 101–111)
Creatinine, Ser: 0.5 mg/dL — ABNORMAL LOW (ref 0.61–1.24)
GFR calc Af Amer: >60 mL/min (ref >60)
GFR calc non Af Amer: >60 mL/min (ref >60)
Glucose, Bld: 103 mg/dL — ABNORMAL HIGH (ref 65–99)
Potassium: 4.1 mmol/L (ref 3.5–5.1)
Sodium: 137 mmol/L (ref 135–145)
Total Bilirubin: 1 mg/dL (ref 0.3–1.2)
Total Protein: 6.4 g/dL — ABNORMAL LOW (ref 6.5–8.1)

## 2016-09-01 LAB — TYPE AND SCREEN
ABO/RH(D): A POS
Antibody Screen: NEGATIVE

## 2016-09-01 LAB — PROTIME-INR
INR: 1
Prothrombin Time: 13.2 seconds (ref 11.4–15.2)

## 2016-09-01 LAB — ABO/RH: ABO/RH(D): A POS

## 2016-09-01 LAB — APTT: aPTT: 26 seconds (ref 24–36)

## 2016-09-01 MED ORDER — ALBUTEROL SULFATE (2.5 MG/3ML) 0.083% IN NEBU
2.5000 mg | INHALATION_SOLUTION | Freq: Once | RESPIRATORY_TRACT | Status: AC
  Administered 2016-09-01: 2.5 mg via RESPIRATORY_TRACT

## 2016-09-01 MED ORDER — CHLORHEXIDINE GLUCONATE 4 % EX LIQD: 30.0000 mL | CUTANEOUS | Status: DC

## 2016-09-01 NOTE — Progress Notes (Signed)
I called a prescription for Mupirocin ointment to [facility]. 

## 2016-09-01 NOTE — Pre-Procedure Instructions (Addendum)
    Lowell BoutonKenneth D Klindt  09/01/2016     Your procedure is scheduled on  Monday, July 30.  [facility] at 5:30 AM                 Your surgery or procedure is scheduled for 7:30 AM   Call this number if you have problems the morning of surgery:  [phone]- (pre- surgery desk)    Remember:  Do not eat food or drink liquids after midnight Sunday, July 29.  Take these medicines the morning of surgery with A SIP OF WATER: Atorvastin (Lipitor),  Fexofenadine (Allegra), Metoprolol (Lopressor).  Use Advair  Inhaler.  If needed use Albuterol Inhaler and bring it to the hospital with you.  May take Nitroglycerin if needed.   Do not wear jewelry, make-up or nail polish.  Do not wear lotions, powders, or coliogne, or deodorant.  Do not shave 48 hours prior to surgery.  Men may shave face and neck.  Do not bring valuables to the hospital.   [facility] is not responsible for any belongings or valuables.  Contacts, dentures or bridgework may not be worn into surgery.  Leave your suitcase in the car.  After surgery it may be brought to your room.  For patients admitted to the hospital, discharge time will be determined by your treatment team.  Special instructions:  Review  [facility] - Preparing For Surgery.  Please read over the following fact sheets that you were given: Pain Booklet, Patient Instructions for Mupirocin Application, Incentive Spirometry, Surgical Site Infections.

## 2016-09-01 NOTE — Progress Notes (Signed)
Pre-op Cardiac Surgery  Carotid Findings:  Bilateral: No significant (1-39%) ICA stenosis. Antegrade vertebral flow.    Upper Extremity Right Left  Brachial Pressures 133 137  Radial Waveforms Tri Tri  Ulnar Waveforms Tri Tri  Palmar Arch (Allen's Test) Obliterates with radial compression, normal with ulnar compression Normal     Farrel DemarkJill Eunice, RDMS, RVT 09/01/2016

## 2016-09-02 ENCOUNTER — Other Ambulatory Visit: Payer: Self-pay | Admitting: *Deleted

## 2016-09-02 DIAGNOSIS — I2510 Atherosclerotic heart disease of native coronary artery without angina pectoris: Secondary | ICD-10-CM

## 2016-09-02 DIAGNOSIS — I350 Nonrheumatic aortic (valve) stenosis: Secondary | ICD-10-CM

## 2016-09-02 LAB — BLOOD GAS, ARTERIAL
Acid-Base Excess: 1.5 mmol/L (ref 0.0–2.0)
Bicarbonate: 25 mmol/L (ref 20.0–28.0)
Drawn by: 470591
FIO2: 21
O2 Saturation: 98.1 %
Patient temperature: 98.6
pCO2 arterial: 36 mmHg (ref 32.0–48.0)
pH, Arterial: 7.457 — ABNORMAL HIGH (ref 7.350–7.450)
pO2, Arterial: 102 mmHg (ref 83.0–108.0)

## 2016-09-02 LAB — HEMOGLOBIN A1C
Hgb A1c MFr Bld: 5.8 % — ABNORMAL HIGH (ref 4.8–5.6)
Mean Plasma Glucose: 120 mg/dL

## 2016-09-04 MED ORDER — PLASMA-LYTE 148 IV SOLN
INTRAVENOUS | Status: AC
  Administered 2016-09-05: 500 mL
  Filled 2016-09-04: qty 2.5

## 2016-09-04 MED ORDER — DEXMEDETOMIDINE HCL IN NACL 400 MCG/100ML IV SOLN
0.1000 ug/kg/h | INTRAVENOUS | Status: AC
  Administered 2016-09-05: .2 ug/kg/h via INTRAVENOUS
  Filled 2016-09-04: qty 100

## 2016-09-04 MED ORDER — SODIUM CHLORIDE 0.9 % IV SOLN
30.0000 ug/min | INTRAVENOUS | Status: AC
  Administered 2016-09-05: 15 ug/min via INTRAVENOUS
  Filled 2016-09-04 (×2): qty 2

## 2016-09-04 MED ORDER — POTASSIUM CHLORIDE 2 MEQ/ML IV SOLN
80.0000 meq | INTRAVENOUS | Status: DC
  Filled 2016-09-04: qty 40

## 2016-09-04 MED ORDER — TRANEXAMIC ACID (OHS) BOLUS VIA INFUSION
15.0000 mg/kg | INTRAVENOUS | Status: DC
  Filled 2016-09-04: qty 1293

## 2016-09-04 MED ORDER — VANCOMYCIN HCL 10 G IV SOLR
1500.0000 mg | INTRAVENOUS | Status: AC
  Administered 2016-09-05: 1500 mg via INTRAVENOUS
  Filled 2016-09-04: qty 1500

## 2016-09-04 MED ORDER — SODIUM CHLORIDE 0.9 % IV SOLN
INTRAVENOUS | Status: DC
  Filled 2016-09-04: qty 30

## 2016-09-04 MED ORDER — SODIUM CHLORIDE 0.9 % IV SOLN
INTRAVENOUS | Status: AC
  Administered 2016-09-05: 1.2 [IU]/h via INTRAVENOUS
  Filled 2016-09-04: qty 1

## 2016-09-04 MED ORDER — EPINEPHRINE PF 1 MG/ML IJ SOLN
0.0000 ug/min | INTRAMUSCULAR | Status: DC
  Filled 2016-09-04: qty 4

## 2016-09-04 MED ORDER — TRANEXAMIC ACID 1000 MG/10ML IV SOLN
1.5000 mg/kg/h | INTRAVENOUS | Status: AC
  Administered 2016-09-05: 15 mg/kg/h via INTRAVENOUS
  Filled 2016-09-04: qty 25

## 2016-09-04 MED ORDER — NITROGLYCERIN IN D5W 200-5 MCG/ML-% IV SOLN
2.0000 ug/min | INTRAVENOUS | Status: DC
  Filled 2016-09-04: qty 250

## 2016-09-04 MED ORDER — MAGNESIUM SULFATE 50 % IJ SOLN
40.0000 meq | INTRAMUSCULAR | Status: DC
  Filled 2016-09-04: qty 10

## 2016-09-04 MED ORDER — CEFUROXIME SODIUM 1.5 G IV SOLR
1.5000 g | INTRAVENOUS | Status: AC
  Administered 2016-09-05: 1.5 g via INTRAVENOUS
  Administered 2016-09-05: .75 g via INTRAVENOUS
  Filled 2016-09-04: qty 1.5

## 2016-09-04 MED ORDER — DEXTROSE 5 % IV SOLN
750.0000 mg | INTRAVENOUS | Status: DC
  Filled 2016-09-04: qty 750

## 2016-09-04 MED ORDER — TRANEXAMIC ACID (OHS) PUMP PRIME SOLUTION
2.0000 mg/kg | INTRAVENOUS | Status: DC
  Filled 2016-09-04: qty 1.72

## 2016-09-04 MED ORDER — DOPAMINE-DEXTROSE 3.2-5 MG/ML-% IV SOLN
0.0000 ug/kg/min | INTRAVENOUS | Status: DC
  Filled 2016-09-04: qty 250

## 2016-09-05 ENCOUNTER — Inpatient Hospital Stay (HOSPITAL_COMMUNITY): Payer: BLUE CROSS/BLUE SHIELD

## 2016-09-05 ENCOUNTER — Inpatient Hospital Stay (HOSPITAL_COMMUNITY): Payer: BLUE CROSS/BLUE SHIELD | Admitting: Anesthesiology

## 2016-09-05 ENCOUNTER — Encounter (HOSPITAL_COMMUNITY)
Admission: RE | Disposition: A | Discharge status: Home / Self Care | Payer: Self-pay | Source: Ambulatory Visit | Attending: Cardiothoracic Surgery

## 2016-09-05 ENCOUNTER — Encounter (HOSPITAL_COMMUNITY): Payer: Self-pay | Admitting: *Deleted

## 2016-09-05 ENCOUNTER — Inpatient Hospital Stay (HOSPITAL_COMMUNITY)
Admission: RE | Admit: 2016-09-05 | Discharge: 2016-09-20 | DRG: 219 | Disposition: A | Discharge status: Home / Self Care | Payer: BLUE CROSS/BLUE SHIELD | Source: Ambulatory Visit | Attending: Cardiothoracic Surgery | Admitting: Cardiothoracic Surgery

## 2016-09-05 ENCOUNTER — Inpatient Hospital Stay (HOSPITAL_COMMUNITY): Payer: BLUE CROSS/BLUE SHIELD | Admitting: Vascular Surgery

## 2016-09-05 DIAGNOSIS — E785 Hyperlipidemia, unspecified: Secondary | ICD-10-CM | POA: Diagnosis present

## 2016-09-05 DIAGNOSIS — I4892 Unspecified atrial flutter: Secondary | ICD-10-CM | POA: Diagnosis not present

## 2016-09-05 DIAGNOSIS — I2510 Atherosclerotic heart disease of native coronary artery without angina pectoris: Secondary | ICD-10-CM

## 2016-09-05 DIAGNOSIS — R911 Solitary pulmonary nodule: Secondary | ICD-10-CM | POA: Diagnosis present

## 2016-09-05 DIAGNOSIS — Z87891 Personal history of nicotine dependence: Secondary | ICD-10-CM

## 2016-09-05 DIAGNOSIS — Z86010 Personal history of colonic polyps: Secondary | ICD-10-CM

## 2016-09-05 DIAGNOSIS — I471 Supraventricular tachycardia: Secondary | ICD-10-CM | POA: Diagnosis not present

## 2016-09-05 DIAGNOSIS — J939 Pneumothorax, unspecified: Secondary | ICD-10-CM | POA: Diagnosis not present

## 2016-09-05 DIAGNOSIS — I484 Atypical atrial flutter: Secondary | ICD-10-CM | POA: Diagnosis not present

## 2016-09-05 DIAGNOSIS — D62 Acute posthemorrhagic anemia: Secondary | ICD-10-CM | POA: Diagnosis not present

## 2016-09-05 DIAGNOSIS — J432 Centrilobular emphysema: Secondary | ICD-10-CM | POA: Diagnosis present

## 2016-09-05 DIAGNOSIS — I352 Nonrheumatic aortic (valve) stenosis with insufficiency: Principal | ICD-10-CM | POA: Diagnosis present

## 2016-09-05 DIAGNOSIS — R339 Retention of urine, unspecified: Secondary | ICD-10-CM | POA: Diagnosis not present

## 2016-09-05 DIAGNOSIS — I10 Essential (primary) hypertension: Secondary | ICD-10-CM | POA: Diagnosis present

## 2016-09-05 DIAGNOSIS — I313 Pericardial effusion (noninflammatory): Secondary | ICD-10-CM | POA: Diagnosis not present

## 2016-09-05 DIAGNOSIS — Z7902 Long term (current) use of antithrombotics/antiplatelets: Secondary | ICD-10-CM

## 2016-09-05 DIAGNOSIS — N390 Urinary tract infection, site not specified: Secondary | ICD-10-CM | POA: Diagnosis not present

## 2016-09-05 DIAGNOSIS — J449 Chronic obstructive pulmonary disease, unspecified: Secondary | ICD-10-CM | POA: Diagnosis not present

## 2016-09-05 DIAGNOSIS — Z8249 Family history of ischemic heart disease and other diseases of the circulatory system: Secondary | ICD-10-CM | POA: Diagnosis not present

## 2016-09-05 DIAGNOSIS — I959 Hypotension, unspecified: Secondary | ICD-10-CM | POA: Diagnosis not present

## 2016-09-05 DIAGNOSIS — I480 Paroxysmal atrial fibrillation: Secondary | ICD-10-CM | POA: Diagnosis not present

## 2016-09-05 DIAGNOSIS — Z9689 Presence of other specified functional implants: Secondary | ICD-10-CM

## 2016-09-05 DIAGNOSIS — R0603 Acute respiratory distress: Secondary | ICD-10-CM

## 2016-09-05 DIAGNOSIS — I714 Abdominal aortic aneurysm, without rupture: Secondary | ICD-10-CM | POA: Diagnosis present

## 2016-09-05 DIAGNOSIS — Z09 Encounter for follow-up examination after completed treatment for conditions other than malignant neoplasm: Secondary | ICD-10-CM

## 2016-09-05 DIAGNOSIS — E873 Alkalosis: Secondary | ICD-10-CM | POA: Diagnosis not present

## 2016-09-05 DIAGNOSIS — Z952 Presence of prosthetic heart valve: Secondary | ICD-10-CM

## 2016-09-05 DIAGNOSIS — R0602 Shortness of breath: Secondary | ICD-10-CM | POA: Diagnosis not present

## 2016-09-05 DIAGNOSIS — J9601 Acute respiratory failure with hypoxia: Secondary | ICD-10-CM | POA: Diagnosis not present

## 2016-09-05 DIAGNOSIS — I4891 Unspecified atrial fibrillation: Secondary | ICD-10-CM

## 2016-09-05 DIAGNOSIS — J9811 Atelectasis: Secondary | ICD-10-CM

## 2016-09-05 DIAGNOSIS — I350 Nonrheumatic aortic (valve) stenosis: Secondary | ICD-10-CM

## 2016-09-05 DIAGNOSIS — Z419 Encounter for procedure for purposes other than remedying health state, unspecified: Secondary | ICD-10-CM

## 2016-09-05 DIAGNOSIS — K760 Fatty (change of) liver, not elsewhere classified: Secondary | ICD-10-CM | POA: Diagnosis present

## 2016-09-05 DIAGNOSIS — I252 Old myocardial infarction: Secondary | ICD-10-CM

## 2016-09-05 DIAGNOSIS — E8770 Fluid overload, unspecified: Secondary | ICD-10-CM | POA: Diagnosis not present

## 2016-09-05 DIAGNOSIS — Z7951 Long term (current) use of inhaled steroids: Secondary | ICD-10-CM

## 2016-09-05 DIAGNOSIS — J90 Pleural effusion, not elsewhere classified: Secondary | ICD-10-CM | POA: Diagnosis not present

## 2016-09-05 DIAGNOSIS — B9620 Unspecified Escherichia coli [E. coli] as the cause of diseases classified elsewhere: Secondary | ICD-10-CM | POA: Diagnosis not present

## 2016-09-05 DIAGNOSIS — Z7982 Long term (current) use of aspirin: Secondary | ICD-10-CM

## 2016-09-05 DIAGNOSIS — Z951 Presence of aortocoronary bypass graft: Secondary | ICD-10-CM

## 2016-09-05 DIAGNOSIS — J439 Emphysema, unspecified: Secondary | ICD-10-CM | POA: Diagnosis not present

## 2016-09-05 HISTORY — PX: CORONARY ARTERY BYPASS GRAFT: SHX141

## 2016-09-05 HISTORY — PX: TEE WITHOUT CARDIOVERSION: SHX5443

## 2016-09-05 HISTORY — PX: ENDOVEIN HARVEST OF GREATER SAPHENOUS VEIN: SHX5059

## 2016-09-05 HISTORY — PX: AORTIC VALVE REPLACEMENT: SHX41

## 2016-09-05 LAB — POCT I-STAT, CHEM 8
BUN: 6 mg/dL (ref 6–20)
BUN: 5 mg/dL — ABNORMAL LOW (ref 6–20)
BUN: 5 mg/dL — ABNORMAL LOW (ref 6–20)
BUN: 4 mg/dL — ABNORMAL LOW (ref 6–20)
BUN: 4 mg/dL — ABNORMAL LOW (ref 6–20)
BUN: 4 mg/dL — ABNORMAL LOW (ref 6–20)
BUN: 6 mg/dL (ref 6–20)
Calcium, Ion: 1.27 mmol/L (ref 1.15–1.40)
Calcium, Ion: 1.06 mmol/L — ABNORMAL LOW (ref 1.15–1.40)
Calcium, Ion: 1.27 mmol/L (ref 1.15–1.40)
Calcium, Ion: 1.03 mmol/L — ABNORMAL LOW (ref 1.15–1.40)
Calcium, Ion: 1.03 mmol/L — ABNORMAL LOW (ref 1.15–1.40)
Calcium, Ion: 1.03 mmol/L — ABNORMAL LOW (ref 1.15–1.40)
Calcium, Ion: 1.11 mmol/L — ABNORMAL LOW (ref 1.15–1.40)
Chloride: 102 mmol/L (ref 101–111)
Chloride: 102 mmol/L (ref 101–111)
Chloride: 99 mmol/L — ABNORMAL LOW (ref 101–111)
Chloride: 100 mmol/L — ABNORMAL LOW (ref 101–111)
Chloride: 100 mmol/L — ABNORMAL LOW (ref 101–111)
Chloride: 101 mmol/L (ref 101–111)
Chloride: 103 mmol/L (ref 101–111)
Creatinine, Ser: 0.5 mg/dL — ABNORMAL LOW (ref 0.61–1.24)
Creatinine, Ser: 0.4 mg/dL — ABNORMAL LOW (ref 0.61–1.24)
Creatinine, Ser: 0.4 mg/dL — ABNORMAL LOW (ref 0.61–1.24)
Creatinine, Ser: 0.3 mg/dL — ABNORMAL LOW (ref 0.61–1.24)
Creatinine, Ser: 0.4 mg/dL — ABNORMAL LOW (ref 0.61–1.24)
Creatinine, Ser: 0.4 mg/dL — ABNORMAL LOW (ref 0.61–1.24)
Creatinine, Ser: 0.4 mg/dL — ABNORMAL LOW (ref 0.61–1.24)
Glucose, Bld: 120 mg/dL — ABNORMAL HIGH (ref 65–99)
Glucose, Bld: 102 mg/dL — ABNORMAL HIGH (ref 65–99)
Glucose, Bld: 108 mg/dL — ABNORMAL HIGH (ref 65–99)
Glucose, Bld: 122 mg/dL — ABNORMAL HIGH (ref 65–99)
Glucose, Bld: 129 mg/dL — ABNORMAL HIGH (ref 65–99)
Glucose, Bld: 112 mg/dL — ABNORMAL HIGH (ref 65–99)
Glucose, Bld: 137 mg/dL — ABNORMAL HIGH (ref 65–99)
HCT: 39 % (ref 39.0–52.0)
HCT: 27 % — ABNORMAL LOW (ref 39.0–52.0)
HCT: 34 % — ABNORMAL LOW (ref 39.0–52.0)
HCT: 26 % — ABNORMAL LOW (ref 39.0–52.0)
HCT: 27 % — ABNORMAL LOW (ref 39.0–52.0)
HCT: 26 % — ABNORMAL LOW (ref 39.0–52.0)
HCT: 29 % — ABNORMAL LOW (ref 39.0–52.0)
Hemoglobin: 13.3 g/dL (ref 13.0–17.0)
Hemoglobin: 11.6 g/dL — ABNORMAL LOW (ref 13.0–17.0)
Hemoglobin: 9.2 g/dL — ABNORMAL LOW (ref 13.0–17.0)
Hemoglobin: 8.8 g/dL — ABNORMAL LOW (ref 13.0–17.0)
Hemoglobin: 9.2 g/dL — ABNORMAL LOW (ref 13.0–17.0)
Hemoglobin: 8.8 g/dL — ABNORMAL LOW (ref 13.0–17.0)
Hemoglobin: 9.9 g/dL — ABNORMAL LOW (ref 13.0–17.0)
Potassium: 4.4 mmol/L (ref 3.5–5.1)
Potassium: 4.6 mmol/L (ref 3.5–5.1)
Potassium: 4.9 mmol/L (ref 3.5–5.1)
Potassium: 4.7 mmol/L (ref 3.5–5.1)
Potassium: 5.4 mmol/L — ABNORMAL HIGH (ref 3.5–5.1)
Potassium: 4.1 mmol/L (ref 3.5–5.1)
Potassium: 4.5 mmol/L (ref 3.5–5.1)
Sodium: 140 mmol/L (ref 135–145)
Sodium: 140 mmol/L (ref 135–145)
Sodium: 140 mmol/L (ref 135–145)
Sodium: 137 mmol/L (ref 135–145)
Sodium: 138 mmol/L (ref 135–145)
Sodium: 140 mmol/L (ref 135–145)
Sodium: 140 mmol/L (ref 135–145)
TCO2: 29 mmol/L (ref 0–100)
TCO2: 28 mmol/L (ref 0–100)
TCO2: 31 mmol/L (ref 0–100)
TCO2: 26 mmol/L (ref 0–100)
TCO2: 27 mmol/L (ref 0–100)
TCO2: 27 mmol/L (ref 0–100)
TCO2: 25 mmol/L (ref 0–100)

## 2016-09-05 LAB — CBC
HCT: 32.9 % — ABNORMAL LOW (ref 39.0–52.0)
HCT: 29.4 % — ABNORMAL LOW (ref 39.0–52.0)
Hemoglobin: 11.3 g/dL — ABNORMAL LOW (ref 13.0–17.0)
Hemoglobin: 10 g/dL — ABNORMAL LOW (ref 13.0–17.0)
MCH: 30.2 pg (ref 26.0–34.0)
MCH: 30.3 pg (ref 26.0–34.0)
MCHC: 34.3 g/dL (ref 30.0–36.0)
MCHC: 34 g/dL (ref 30.0–36.0)
MCV: 88 fL (ref 78.0–100.0)
MCV: 89.1 fL (ref 78.0–100.0)
Platelets: 112 10*3/uL — ABNORMAL LOW (ref 150–400)
Platelets: 100 10*3/uL — ABNORMAL LOW (ref 150–400)
RBC: 3.74 MIL/uL — ABNORMAL LOW (ref 4.22–5.81)
RBC: 3.3 MIL/uL — ABNORMAL LOW (ref 4.22–5.81)
RDW: 13.8 % (ref 11.5–15.5)
RDW: 14 % (ref 11.5–15.5)
WBC: 16.6 10*3/uL — ABNORMAL HIGH (ref 4.0–10.5)
WBC: 12.2 10*3/uL — ABNORMAL HIGH (ref 4.0–10.5)

## 2016-09-05 LAB — POCT I-STAT 3, ART BLOOD GAS (G3+)
Acid-Base Excess: 6 mmol/L — ABNORMAL HIGH (ref 0.0–2.0)
Acid-Base Excess: 6 mmol/L — ABNORMAL HIGH (ref 0.0–2.0)
Acid-Base Excess: 5 mmol/L — ABNORMAL HIGH (ref 0.0–2.0)
Acid-Base Excess: 1 mmol/L (ref 0.0–2.0)
Acid-base deficit: 1 mmol/L (ref 0.0–2.0)
Acid-base deficit: 5 mmol/L — ABNORMAL HIGH (ref 0.0–2.0)
Acid-base deficit: 2 mmol/L (ref 0.0–2.0)
Acid-base deficit: 1 mmol/L (ref 0.0–2.0)
Acid-base deficit: 1 mmol/L (ref 0.0–2.0)
Bicarbonate: 32.3 mmol/L — ABNORMAL HIGH (ref 20.0–28.0)
Bicarbonate: 29.2 mmol/L — ABNORMAL HIGH (ref 20.0–28.0)
Bicarbonate: 25.9 mmol/L (ref 20.0–28.0)
Bicarbonate: 28.3 mmol/L — ABNORMAL HIGH (ref 20.0–28.0)
Bicarbonate: 27.5 mmol/L (ref 20.0–28.0)
Bicarbonate: 24.2 mmol/L (ref 20.0–28.0)
Bicarbonate: 28 mmol/L (ref 20.0–28.0)
Bicarbonate: 24.6 mmol/L (ref 20.0–28.0)
Bicarbonate: 24.9 mmol/L (ref 20.0–28.0)
Bicarbonate: 25.1 mmol/L (ref 20.0–28.0)
O2 Saturation: 100 %
O2 Saturation: 100 %
O2 Saturation: 100 %
O2 Saturation: 100 %
O2 Saturation: 100 %
O2 Saturation: 97 %
O2 Saturation: 98 %
O2 Saturation: 99 %
O2 Saturation: 99 %
O2 Saturation: 95 %
Patient temperature: 36.2
Patient temperature: 36.2
Patient temperature: 35.5
Patient temperature: 36.2
Patient temperature: 37.8
TCO2: 34 mmol/L (ref 0–100)
TCO2: 30 mmol/L (ref 0–100)
TCO2: 27 mmol/L (ref 0–100)
TCO2: 29 mmol/L (ref 0–100)
TCO2: 29 mmol/L (ref 0–100)
TCO2: 26 mmol/L (ref 0–100)
TCO2: 30 mmol/L (ref 0–100)
TCO2: 26 mmol/L (ref 0–100)
TCO2: 26 mmol/L (ref 0–100)
TCO2: 26 mmol/L (ref 0–100)
pCO2 arterial: 52.7 mmHg — ABNORMAL HIGH (ref 32.0–48.0)
pCO2 arterial: 36.7 mmHg (ref 32.0–48.0)
pCO2 arterial: 36.6 mmHg (ref 32.0–48.0)
pCO2 arterial: 45.6 mmHg (ref 32.0–48.0)
pCO2 arterial: 51.8 mmHg — ABNORMAL HIGH (ref 32.0–48.0)
pCO2 arterial: 68 mmHg (ref 32.0–48.0)
pCO2 arterial: 72.6 mmHg (ref 32.0–48.0)
pCO2 arterial: 49.3 mmHg — ABNORMAL HIGH (ref 32.0–48.0)
pCO2 arterial: 44.1 mmHg (ref 32.0–48.0)
pCO2 arterial: 47.8 mmHg (ref 32.0–48.0)
pH, Arterial: 7.396 (ref 7.350–7.450)
pH, Arterial: 7.509 — ABNORMAL HIGH (ref 7.350–7.450)
pH, Arterial: 7.362 (ref 7.350–7.450)
pH, Arterial: 7.497 — ABNORMAL HIGH (ref 7.350–7.450)
pH, Arterial: 7.334 — ABNORMAL LOW (ref 7.350–7.450)
pH, Arterial: 7.155 — CL (ref 7.350–7.450)
pH, Arterial: 7.189 — CL (ref 7.350–7.450)
pH, Arterial: 7.299 — ABNORMAL LOW (ref 7.350–7.450)
pH, Arterial: 7.356 (ref 7.350–7.450)
pH, Arterial: 7.332 — ABNORMAL LOW (ref 7.350–7.450)
pO2, Arterial: 331 mmHg — ABNORMAL HIGH (ref 83.0–108.0)
pO2, Arterial: 361 mmHg — ABNORMAL HIGH (ref 83.0–108.0)
pO2, Arterial: 243 mmHg — ABNORMAL HIGH (ref 83.0–108.0)
pO2, Arterial: 345 mmHg — ABNORMAL HIGH (ref 83.0–108.0)
pO2, Arterial: 316 mmHg — ABNORMAL HIGH (ref 83.0–108.0)
pO2, Arterial: 112 mmHg — ABNORMAL HIGH (ref 83.0–108.0)
pO2, Arterial: 127 mmHg — ABNORMAL HIGH (ref 83.0–108.0)
pO2, Arterial: 138 mmHg — ABNORMAL HIGH (ref 83.0–108.0)
pO2, Arterial: 134 mmHg — ABNORMAL HIGH (ref 83.0–108.0)
pO2, Arterial: 85 mmHg (ref 83.0–108.0)

## 2016-09-05 LAB — POCT I-STAT 4, (NA,K, GLUC, HGB,HCT)
Glucose, Bld: 105 mg/dL — ABNORMAL HIGH (ref 65–99)
HCT: 30 % — ABNORMAL LOW (ref 39.0–52.0)
Hemoglobin: 10.2 g/dL — ABNORMAL LOW (ref 13.0–17.0)
Potassium: 4.5 mmol/L (ref 3.5–5.1)
Sodium: 143 mmol/L (ref 135–145)

## 2016-09-05 LAB — PROTIME-INR
INR: 1.64
Prothrombin Time: 19.6 seconds — ABNORMAL HIGH (ref 11.4–15.2)

## 2016-09-05 LAB — GLUCOSE, CAPILLARY
Glucose-Capillary: 101 mg/dL — ABNORMAL HIGH (ref 65–99)
Glucose-Capillary: 91 mg/dL (ref 65–99)
Glucose-Capillary: 126 mg/dL — ABNORMAL HIGH (ref 65–99)

## 2016-09-05 LAB — APTT: aPTT: 47 seconds — ABNORMAL HIGH (ref 24–36)

## 2016-09-05 LAB — PLATELET COUNT: Platelets: 120 10*3/uL — ABNORMAL LOW (ref 150–400)

## 2016-09-05 LAB — CREATININE, SERUM
Creatinine, Ser: 0.56 mg/dL — ABNORMAL LOW (ref 0.61–1.24)
GFR calc Af Amer: >60 mL/min (ref >60)
GFR calc non Af Amer: >60 mL/min (ref >60)

## 2016-09-05 LAB — HEMOGLOBIN AND HEMATOCRIT, BLOOD
HCT: 27 % — ABNORMAL LOW (ref 39.0–52.0)
Hemoglobin: 9.3 g/dL — ABNORMAL LOW (ref 13.0–17.0)

## 2016-09-05 LAB — MAGNESIUM: Magnesium: 2.6 mg/dL — ABNORMAL HIGH (ref 1.7–2.4)

## 2016-09-05 SURGERY — REPLACEMENT, AORTIC VALVE, OPEN
Anesthesia: General | Site: Leg Upper | Laterality: Right

## 2016-09-05 MED ORDER — METOPROLOL TARTRATE 25 MG/10 ML ORAL SUSPENSION: 12.5000 mg | Freq: Two times a day (BID) | ORAL | Status: DC

## 2016-09-05 MED ORDER — VANCOMYCIN HCL IN DEXTROSE 1-5 GM/200ML-% IV SOLN
1000.0000 mg | Freq: Once | INTRAVENOUS | Status: AC
  Administered 2016-09-05: 1000 mg via INTRAVENOUS
  Filled 2016-09-05: qty 200

## 2016-09-05 MED ORDER — SODIUM CHLORIDE 0.9 % IJ SOLN
INTRAMUSCULAR | Status: AC
  Filled 2016-09-05: qty 10

## 2016-09-05 MED ORDER — LEVALBUTEROL HCL 0.63 MG/3ML IN NEBU
0.6300 mg | INHALATION_SOLUTION | Freq: Four times a day (QID) | RESPIRATORY_TRACT | Status: DC
  Administered 2016-09-05 – 2016-09-07 (×10): 0.63 mg via RESPIRATORY_TRACT
  Filled 2016-09-05 (×9): qty 3

## 2016-09-05 MED ORDER — MIDAZOLAM HCL 10 MG/2ML IJ SOLN
INTRAMUSCULAR | Status: AC
  Filled 2016-09-05: qty 2

## 2016-09-05 MED ORDER — CHLORHEXIDINE GLUCONATE 0.12 % MT SOLN
OROMUCOSAL | Status: AC
  Administered 2016-09-05: 15 mL via OROMUCOSAL
  Filled 2016-09-05: qty 15

## 2016-09-05 MED ORDER — ALBUMIN HUMAN 5 % IV SOLN
INTRAVENOUS | Status: AC
  Administered 2016-09-05: 250 mL via INTRAVENOUS
  Filled 2016-09-05: qty 250

## 2016-09-05 MED ORDER — ARTIFICIAL TEARS OPHTHALMIC OINT
TOPICAL_OINTMENT | OPHTHALMIC | Status: DC | PRN
  Administered 2016-09-05: 1 via OPHTHALMIC

## 2016-09-05 MED ORDER — MORPHINE SULFATE (PF) 2 MG/ML IV SOLN: 1.0000 mg | INTRAVENOUS | Status: DC | PRN

## 2016-09-05 MED ORDER — METOCLOPRAMIDE HCL 5 MG/ML IJ SOLN
10.0000 mg | Freq: Four times a day (QID) | INTRAMUSCULAR | Status: AC
  Administered 2016-09-05 – 2016-09-06 (×4): 10 mg via INTRAVENOUS
  Filled 2016-09-05 (×3): qty 2

## 2016-09-05 MED ORDER — LACTATED RINGERS IV SOLN
500.0000 mL | Freq: Once | INTRAVENOUS | Status: AC | PRN
  Administered 2016-09-05: 500 mL via INTRAVENOUS

## 2016-09-05 MED ORDER — LEVALBUTEROL HCL 0.63 MG/3ML IN NEBU
INHALATION_SOLUTION | RESPIRATORY_TRACT | Status: AC
  Administered 2016-09-05: 0.63 mg via RESPIRATORY_TRACT
  Filled 2016-09-05: qty 3

## 2016-09-05 MED ORDER — CHLORHEXIDINE GLUCONATE CLOTH 2 % EX PADS
6.0000 | MEDICATED_PAD | Freq: Every day | CUTANEOUS | Status: DC
  Administered 2016-09-06 – 2016-09-11 (×6): 6 via TOPICAL

## 2016-09-05 MED ORDER — FLUTICASONE PROPIONATE 50 MCG/ACT NA SUSP
1.0000 | Freq: Every day | NASAL | Status: DC | PRN
  Filled 2016-09-05: qty 16

## 2016-09-05 MED ORDER — MORPHINE SULFATE (PF) 2 MG/ML IV SOLN: 2.0000 mg | INTRAVENOUS | Status: DC | PRN

## 2016-09-05 MED ORDER — HEMOSTATIC AGENTS (NO CHARGE) OPTIME
TOPICAL | Status: DC | PRN
  Administered 2016-09-05 (×5): 1 via TOPICAL

## 2016-09-05 MED ORDER — MIDAZOLAM HCL 5 MG/5ML IJ SOLN
INTRAMUSCULAR | Status: DC | PRN
Start: 2016-09-05 — End: 2016-09-05
  Administered 2016-09-05: 2 mg via INTRAVENOUS
  Administered 2016-09-05: 4 mg via INTRAVENOUS
  Administered 2016-09-05: 1 mg via INTRAVENOUS
  Administered 2016-09-05: 2 mg via INTRAVENOUS
  Administered 2016-09-05: 3 mg via INTRAVENOUS

## 2016-09-05 MED ORDER — CHLORHEXIDINE GLUCONATE 0.12 % MT SOLN
OROMUCOSAL | Status: AC
  Administered 2016-09-05: 15 mL
  Filled 2016-09-05: qty 15

## 2016-09-05 MED ORDER — ACETAMINOPHEN 160 MG/5ML PO SOLN: 1000.0000 mg | Freq: Four times a day (QID) | ORAL | Status: AC

## 2016-09-05 MED ORDER — FAMOTIDINE IN NACL 20-0.9 MG/50ML-% IV SOLN
20.0000 mg | Freq: Two times a day (BID) | INTRAVENOUS | Status: AC
  Administered 2016-09-05 (×2): 20 mg via INTRAVENOUS
  Filled 2016-09-05: qty 50

## 2016-09-05 MED ORDER — ALBUMIN HUMAN 5 % IV SOLN
INTRAVENOUS | Status: DC | PRN
  Administered 2016-09-05 (×3): via INTRAVENOUS

## 2016-09-05 MED ORDER — ORAL CARE MOUTH RINSE
15.0000 mL | Freq: Four times a day (QID) | OROMUCOSAL | Status: DC
  Administered 2016-09-05 – 2016-09-08 (×6): 15 mL via OROMUCOSAL

## 2016-09-05 MED ORDER — PROTAMINE SULFATE 10 MG/ML IV SOLN
INTRAVENOUS | Status: DC | PRN
  Administered 2016-09-05: 20 mg via INTRAVENOUS
  Administered 2016-09-05 (×2): 40 mg via INTRAVENOUS
  Administered 2016-09-05: 45 mg via INTRAVENOUS
  Administered 2016-09-05: 25 mg via INTRAVENOUS
  Administered 2016-09-05 (×3): 30 mg via INTRAVENOUS
  Administered 2016-09-05: 15 mg via INTRAVENOUS

## 2016-09-05 MED ORDER — MOMETASONE FURO-FORMOTEROL FUM 100-5 MCG/ACT IN AERO
2.0000 | INHALATION_SPRAY | Freq: Two times a day (BID) | RESPIRATORY_TRACT | Status: DC
  Administered 2016-09-06 – 2016-09-13 (×14): 2 via RESPIRATORY_TRACT
  Filled 2016-09-05 (×2): qty 8.8

## 2016-09-05 MED ORDER — ASPIRIN 81 MG PO CHEW
324.0000 mg | CHEWABLE_TABLET | Freq: Every day | ORAL | Status: DC
  Filled 2016-09-05 (×3): qty 4

## 2016-09-05 MED ORDER — SODIUM CHLORIDE 0.9% FLUSH
10.0000 mL | Freq: Two times a day (BID) | INTRAVENOUS | Status: DC
  Administered 2016-09-06 – 2016-09-09 (×5): 10 mL

## 2016-09-05 MED ORDER — INSULIN REGULAR BOLUS VIA INFUSION
0.0000 [IU] | Freq: Three times a day (TID) | INTRAVENOUS | Status: DC
  Filled 2016-09-05: qty 10

## 2016-09-05 MED ORDER — TIOTROPIUM BROMIDE MONOHYDRATE 18 MCG IN CAPS
18.0000 ug | ORAL_CAPSULE | Freq: Every day | RESPIRATORY_TRACT | Status: DC
Start: 2016-09-05 — End: 2016-09-13
  Administered 2016-09-06 – 2016-09-13 (×7): 18 ug via RESPIRATORY_TRACT
  Filled 2016-09-05 (×3): qty 5

## 2016-09-05 MED ORDER — CHLORHEXIDINE GLUCONATE 0.12% ORAL RINSE (MEDLINE KIT)
15.0000 mL | Freq: Two times a day (BID) | OROMUCOSAL | Status: DC
  Administered 2016-09-05 – 2016-09-08 (×4): 15 mL via OROMUCOSAL

## 2016-09-05 MED ORDER — SODIUM CHLORIDE 0.9 % IV SOLN
0.0000 ug/kg/h | INTRAVENOUS | Status: DC
  Administered 2016-09-05: 0.5 ug/kg/h via INTRAVENOUS
  Filled 2016-09-05: qty 2

## 2016-09-05 MED ORDER — INSULIN ASPART 100 UNIT/ML ~~LOC~~ SOLN
0.0000 [IU] | SUBCUTANEOUS | Status: DC
  Administered 2016-09-05 – 2016-09-06 (×3): 2 [IU] via SUBCUTANEOUS

## 2016-09-05 MED ORDER — PANTOPRAZOLE SODIUM 40 MG PO TBEC
40.0000 mg | DELAYED_RELEASE_TABLET | Freq: Every day | ORAL | Status: DC
  Administered 2016-09-07 – 2016-09-20 (×14): 40 mg via ORAL
  Filled 2016-09-05 (×14): qty 1

## 2016-09-05 MED ORDER — LACTATED RINGERS IV SOLN
INTRAVENOUS | Status: DC
  Administered 2016-09-07: 06:00:00 via INTRAVENOUS

## 2016-09-05 MED ORDER — PHENYLEPHRINE HCL 10 MG/ML IJ SOLN
INTRAMUSCULAR | Status: DC | PRN
  Administered 2016-09-05 (×6): 40 ug via INTRAVENOUS

## 2016-09-05 MED ORDER — MORPHINE SULFATE (PF) 4 MG/ML IV SOLN
2.0000 mg | INTRAVENOUS | Status: DC | PRN
  Administered 2016-09-05 (×2): 2 mg via INTRAVENOUS
  Administered 2016-09-06: 4 mg via INTRAVENOUS
  Administered 2016-09-06: 2 mg via INTRAVENOUS
  Administered 2016-09-06: 4 mg via INTRAVENOUS
  Administered 2016-09-06: 2 mg via INTRAVENOUS
  Administered 2016-09-06 – 2016-09-07 (×4): 4 mg via INTRAVENOUS
  Filled 2016-09-05 (×9): qty 1

## 2016-09-05 MED ORDER — LACTATED RINGERS IV SOLN
INTRAVENOUS | Status: DC | PRN
  Administered 2016-09-05: 07:00:00 via INTRAVENOUS

## 2016-09-05 MED ORDER — SODIUM CHLORIDE 0.9% FLUSH
3.0000 mL | Freq: Two times a day (BID) | INTRAVENOUS | Status: DC
  Administered 2016-09-06: 3 mL via INTRAVENOUS
  Administered 2016-09-06: 10 mL via INTRAVENOUS
  Administered 2016-09-07 – 2016-09-09 (×5): 3 mL via INTRAVENOUS

## 2016-09-05 MED ORDER — ALBUMIN HUMAN 5 % IV SOLN
250.0000 mL | INTRAVENOUS | Status: AC | PRN
  Administered 2016-09-05 (×5): 250 mL via INTRAVENOUS
  Filled 2016-09-05 (×2): qty 250

## 2016-09-05 MED ORDER — BISACODYL 5 MG PO TBEC
10.0000 mg | DELAYED_RELEASE_TABLET | Freq: Every day | ORAL | Status: DC
  Administered 2016-09-06 – 2016-09-20 (×10): 10 mg via ORAL
  Filled 2016-09-05 (×13): qty 2

## 2016-09-05 MED ORDER — ACETAMINOPHEN 500 MG PO TABS
1000.0000 mg | ORAL_TABLET | Freq: Four times a day (QID) | ORAL | Status: AC
  Administered 2016-09-05 – 2016-09-10 (×17): 1000 mg via ORAL
  Filled 2016-09-05 (×18): qty 2

## 2016-09-05 MED ORDER — MORPHINE SULFATE (PF) 4 MG/ML IV SOLN: 1.0000 mg | INTRAVENOUS | Status: DC | PRN

## 2016-09-05 MED ORDER — VECURONIUM BROMIDE 10 MG IV SOLR
INTRAVENOUS | Status: DC | PRN
  Administered 2016-09-05 (×2): 5 mg via INTRAVENOUS
  Administered 2016-09-05: 3 mg via INTRAVENOUS

## 2016-09-05 MED ORDER — PROPOFOL 10 MG/ML IV BOLUS
INTRAVENOUS | Status: DC | PRN
  Administered 2016-09-05: 50 mg via INTRAVENOUS
  Administered 2016-09-05: 20 mg via INTRAVENOUS

## 2016-09-05 MED ORDER — HEPARIN SODIUM (PORCINE) 1000 UNIT/ML IJ SOLN
INTRAMUSCULAR | Status: AC
  Filled 2016-09-05: qty 1

## 2016-09-05 MED ORDER — SODIUM CHLORIDE 0.9% FLUSH: 10.0000 mL | INTRAVENOUS | Status: DC | PRN

## 2016-09-05 MED ORDER — ROCURONIUM BROMIDE 10 MG/ML (PF) SYRINGE
PREFILLED_SYRINGE | INTRAVENOUS | Status: DC | PRN
  Administered 2016-09-05 (×4): 50 mg via INTRAVENOUS

## 2016-09-05 MED ORDER — BISACODYL 10 MG RE SUPP
10.0000 mg | Freq: Every day | RECTAL | Status: DC
  Filled 2016-09-05: qty 1

## 2016-09-05 MED ORDER — METOPROLOL TARTRATE 12.5 MG HALF TABLET: 12.5000 mg | ORAL_TABLET | Freq: Once | ORAL | Status: DC

## 2016-09-05 MED ORDER — LACTATED RINGERS IV SOLN: INTRAVENOUS | Status: DC

## 2016-09-05 MED ORDER — MIDAZOLAM HCL 2 MG/2ML IJ SOLN: 2.0000 mg | INTRAMUSCULAR | Status: DC | PRN

## 2016-09-05 MED ORDER — ALBUTEROL SULFATE HFA 108 (90 BASE) MCG/ACT IN AERS
INHALATION_SPRAY | RESPIRATORY_TRACT | Status: AC
  Filled 2016-09-05: qty 6.7

## 2016-09-05 MED ORDER — PROPOFOL 10 MG/ML IV BOLUS
INTRAVENOUS | Status: AC
  Filled 2016-09-05: qty 20

## 2016-09-05 MED ORDER — ALBUTEROL SULFATE (2.5 MG/3ML) 0.083% IN NEBU
2.5000 mg | INHALATION_SOLUTION | RESPIRATORY_TRACT | Status: DC | PRN
  Filled 2016-09-05: qty 3

## 2016-09-05 MED ORDER — CHLORHEXIDINE GLUCONATE 0.12 % MT SOLN
15.0000 mL | OROMUCOSAL | Status: AC
  Administered 2016-09-05: 15 mL via OROMUCOSAL

## 2016-09-05 MED ORDER — ONDANSETRON HCL 4 MG/2ML IJ SOLN
4.0000 mg | Freq: Four times a day (QID) | INTRAMUSCULAR | Status: DC | PRN
  Administered 2016-09-07 – 2016-09-10 (×2): 4 mg via INTRAVENOUS
  Filled 2016-09-05 (×2): qty 2

## 2016-09-05 MED ORDER — DEXTROSE 5 % IV SOLN
1.5000 g | Freq: Two times a day (BID) | INTRAVENOUS | Status: AC
  Administered 2016-09-06 – 2016-09-07 (×4): 1.5 g via INTRAVENOUS
  Filled 2016-09-05 (×4): qty 1.5

## 2016-09-05 MED ORDER — MIDAZOLAM HCL 2 MG/2ML IJ SOLN
INTRAMUSCULAR | Status: AC
  Filled 2016-09-05: qty 2

## 2016-09-05 MED ORDER — METOPROLOL TARTRATE 5 MG/5ML IV SOLN
2.5000 mg | INTRAVENOUS | Status: DC | PRN
  Administered 2016-09-12: 5 mg via INTRAVENOUS
  Filled 2016-09-05: qty 5

## 2016-09-05 MED ORDER — POTASSIUM CHLORIDE 10 MEQ/50ML IV SOLN: 10.0000 meq | INTRAVENOUS | Status: AC

## 2016-09-05 MED ORDER — ATORVASTATIN CALCIUM 40 MG PO TABS
40.0000 mg | ORAL_TABLET | Freq: Every day | ORAL | Status: DC
  Administered 2016-09-06 – 2016-09-19 (×14): 40 mg via ORAL
  Filled 2016-09-05 (×15): qty 1

## 2016-09-05 MED ORDER — PHENYLEPHRINE 40 MCG/ML (10ML) SYRINGE FOR IV PUSH (FOR BLOOD PRESSURE SUPPORT)
PREFILLED_SYRINGE | INTRAVENOUS | Status: AC
  Filled 2016-09-05: qty 10

## 2016-09-05 MED ORDER — SODIUM CHLORIDE 0.9% FLUSH
3.0000 mL | INTRAVENOUS | Status: DC | PRN
  Administered 2016-09-08: 3 mL via INTRAVENOUS
  Filled 2016-09-05: qty 3

## 2016-09-05 MED ORDER — PROTAMINE SULFATE 10 MG/ML IV SOLN
INTRAVENOUS | Status: AC
  Filled 2016-09-05: qty 25

## 2016-09-05 MED ORDER — SODIUM CHLORIDE 0.9 % IV SOLN
INTRAVENOUS | Status: DC
  Filled 2016-09-05: qty 1

## 2016-09-05 MED ORDER — ASPIRIN EC 325 MG PO TBEC
325.0000 mg | DELAYED_RELEASE_TABLET | Freq: Every day | ORAL | Status: DC
  Administered 2016-09-06 – 2016-09-16 (×11): 325 mg via ORAL
  Filled 2016-09-05 (×11): qty 1

## 2016-09-05 MED ORDER — FENTANYL CITRATE (PF) 250 MCG/5ML IJ SOLN
INTRAMUSCULAR | Status: DC | PRN
  Administered 2016-09-05 (×2): 250 ug via INTRAVENOUS
  Administered 2016-09-05: 150 ug via INTRAVENOUS
  Administered 2016-09-05: 250 ug via INTRAVENOUS
  Administered 2016-09-05: 200 ug via INTRAVENOUS
  Administered 2016-09-05: 100 ug via INTRAVENOUS
  Administered 2016-09-05: 50 ug via INTRAVENOUS

## 2016-09-05 MED ORDER — SODIUM CHLORIDE 0.9 % IV SOLN
INTRAVENOUS | Status: DC
  Administered 2016-09-05: 16:00:00 via INTRAVENOUS

## 2016-09-05 MED ORDER — ACETAMINOPHEN 160 MG/5ML PO SOLN: 650.0000 mg | Freq: Once | ORAL | Status: AC

## 2016-09-05 MED ORDER — NITROGLYCERIN IN D5W 200-5 MCG/ML-% IV SOLN: 0.0000 ug/min | INTRAVENOUS | Status: DC

## 2016-09-05 MED ORDER — DOCUSATE SODIUM 100 MG PO CAPS
200.0000 mg | ORAL_CAPSULE | Freq: Every day | ORAL | Status: DC
  Administered 2016-09-06 – 2016-09-20 (×14): 200 mg via ORAL
  Filled 2016-09-05 (×14): qty 2

## 2016-09-05 MED ORDER — NAPHAZOLINE-GLYCERIN 0.012-0.2 % OP SOLN: 1.0000 [drp] | Freq: Four times a day (QID) | OPHTHALMIC | Status: DC | PRN

## 2016-09-05 MED ORDER — VECURONIUM BROMIDE 10 MG IV SOLR
INTRAVENOUS | Status: AC
  Filled 2016-09-05: qty 10

## 2016-09-05 MED ORDER — SODIUM CHLORIDE 0.9 % IV SOLN
0.0000 ug/min | INTRAVENOUS | Status: DC
  Administered 2016-09-06: 45 ug/min via INTRAVENOUS
  Administered 2016-09-06: 40 ug/min via INTRAVENOUS
  Administered 2016-09-06: 75 ug/min via INTRAVENOUS
  Filled 2016-09-05 (×3): qty 2

## 2016-09-05 MED ORDER — LACTATED RINGERS IV SOLN
INTRAVENOUS | Status: DC | PRN
  Administered 2016-09-05 (×2): via INTRAVENOUS

## 2016-09-05 MED ORDER — LORATADINE 10 MG PO TABS
10.0000 mg | ORAL_TABLET | Freq: Every day | ORAL | Status: DC
  Administered 2016-09-06 – 2016-09-19 (×14): 10 mg via ORAL
  Filled 2016-09-05 (×15): qty 1

## 2016-09-05 MED ORDER — ROCURONIUM BROMIDE 10 MG/ML (PF) SYRINGE
PREFILLED_SYRINGE | INTRAVENOUS | Status: AC
  Filled 2016-09-05: qty 10

## 2016-09-05 MED ORDER — LACTATED RINGERS IV SOLN
INTRAVENOUS | Status: DC | PRN
  Administered 2016-09-05: 08:00:00 via INTRAVENOUS

## 2016-09-05 MED ORDER — TRAMADOL HCL 50 MG PO TABS
50.0000 mg | ORAL_TABLET | ORAL | Status: DC | PRN
  Administered 2016-09-06 – 2016-09-07 (×2): 100 mg via ORAL
  Filled 2016-09-05 (×2): qty 2

## 2016-09-05 MED ORDER — ACETAMINOPHEN 650 MG RE SUPP
650.0000 mg | Freq: Once | RECTAL | Status: AC
  Administered 2016-09-05: 650 mg via RECTAL

## 2016-09-05 MED ORDER — FENTANYL CITRATE (PF) 250 MCG/5ML IJ SOLN
INTRAMUSCULAR | Status: AC
  Filled 2016-09-05: qty 25

## 2016-09-05 MED ORDER — METOPROLOL TARTRATE 12.5 MG HALF TABLET
12.5000 mg | ORAL_TABLET | Freq: Two times a day (BID) | ORAL | Status: DC
  Administered 2016-09-06 (×2): 12.5 mg via ORAL
  Filled 2016-09-05 (×2): qty 1

## 2016-09-05 MED ORDER — SODIUM CHLORIDE 0.9 % IV SOLN: 250.0000 mL | INTRAVENOUS | Status: DC

## 2016-09-05 MED ORDER — OXYCODONE HCL 5 MG PO TABS
5.0000 mg | ORAL_TABLET | ORAL | Status: DC | PRN
  Administered 2016-09-06 (×4): 10 mg via ORAL
  Administered 2016-09-07: 5 mg via ORAL
  Administered 2016-09-07 – 2016-09-08 (×3): 10 mg via ORAL
  Filled 2016-09-05 (×2): qty 2
  Filled 2016-09-05: qty 1
  Filled 2016-09-05 (×5): qty 2

## 2016-09-05 MED ORDER — HEPARIN SODIUM (PORCINE) 1000 UNIT/ML IJ SOLN
INTRAMUSCULAR | Status: DC | PRN
  Administered 2016-09-05: 26000 [IU] via INTRAVENOUS

## 2016-09-05 MED ORDER — SODIUM CHLORIDE 0.45 % IV SOLN
INTRAVENOUS | Status: DC | PRN
  Administered 2016-09-05: 17:00:00 via INTRAVENOUS

## 2016-09-05 MED ORDER — MAGNESIUM SULFATE 4 GM/100ML IV SOLN
4.0000 g | Freq: Once | INTRAVENOUS | Status: AC
  Administered 2016-09-05: 4 g via INTRAVENOUS
  Filled 2016-09-05: qty 100

## 2016-09-05 MED ORDER — SODIUM CHLORIDE 0.9 % IJ SOLN
OROMUCOSAL | Status: DC | PRN
  Administered 2016-09-05 (×3): 4 mL via TOPICAL

## 2016-09-05 MED ORDER — ONDANSETRON HCL 4 MG/2ML IJ SOLN
INTRAMUSCULAR | Status: AC
  Filled 2016-09-05: qty 2

## 2016-09-05 MED ORDER — CHLORHEXIDINE GLUCONATE 0.12 % MT SOLN
15.0000 mL | Freq: Once | OROMUCOSAL | Status: AC
  Administered 2016-09-05: 15 mL via OROMUCOSAL

## 2016-09-05 MED FILL — Sodium Chloride IV Soln 0.9%: INTRAVENOUS | Qty: 2000 | Status: AC

## 2016-09-05 MED FILL — Magnesium Sulfate Inj 50%: INTRAMUSCULAR | Qty: 10 | Status: AC

## 2016-09-05 MED FILL — Potassium Chloride Inj 2 mEq/ML: INTRAVENOUS | Qty: 10 | Status: AC

## 2016-09-05 MED FILL — Electrolyte-R (PH 7.4) Solution: INTRAVENOUS | Qty: 6000 | Status: AC

## 2016-09-05 MED FILL — Lidocaine HCl IV Inj 20 MG/ML: INTRAVENOUS | Qty: 10 | Status: AC

## 2016-09-05 MED FILL — Sodium Bicarbonate IV Soln 8.4%: INTRAVENOUS | Qty: 50 | Status: AC

## 2016-09-05 MED FILL — Heparin Sodium (Porcine) Inj 1000 Unit/ML: INTRAMUSCULAR | Qty: 30 | Status: AC

## 2016-09-05 MED FILL — Albumin, Human Inj 5%: INTRAVENOUS | Qty: 250 | Status: AC

## 2016-09-05 MED FILL — Mannitol IV Soln 20%: INTRAVENOUS | Qty: 500 | Status: AC

## 2016-09-05 MED FILL — Heparin Sodium (Porcine) Inj 1000 Unit/ML: INTRAMUSCULAR | Qty: 10 | Status: AC

## 2016-09-05 SURGICAL SUPPLY — 106 items
ADAPTER CARDIO PERF ANTE/RETRO (ADAPTER) ×5 IMPLANT
ADPR PRFSN 84XANTGRD RTRGD (ADAPTER) ×6
APPLICATOR COTTON TIP 6IN STRL (MISCELLANEOUS) IMPLANT
BAG DECANTER FOR FLEXI CONT (MISCELLANEOUS) ×4 IMPLANT
BANDAGE ACE 4X5 VEL STRL LF (GAUZE/BANDAGES/DRESSINGS) ×4 IMPLANT
BANDAGE ACE 6X5 VEL STRL LF (GAUZE/BANDAGES/DRESSINGS) ×4 IMPLANT
BLADE STERNUM SYSTEM 6 (BLADE) ×4 IMPLANT
BLADE SURG 11 STRL SS (BLADE) ×1 IMPLANT
BLADE SURG 15 STRL LF DISP TIS (BLADE) ×3 IMPLANT
BLADE SURG 15 STRL SS (BLADE) ×4
BNDG GAUZE ELAST 4 BULKY (GAUZE/BANDAGES/DRESSINGS) ×4 IMPLANT
BOOT SUTURE AID YELLOW STND (SUTURE) IMPLANT
CANISTER SUCT 3000ML PPV (MISCELLANEOUS) ×4 IMPLANT
CANNULA GUNDRY RCSP 15FR (MISCELLANEOUS) ×5 IMPLANT
CATH CPB KIT GERHARDT (MISCELLANEOUS) ×4 IMPLANT
CATH HEART VENT LEFT (CATHETERS) ×3 IMPLANT
CATH THORACIC 28FR (CATHETERS) ×4 IMPLANT
CATH/SQUID NICHOLS JEHLE COR (CATHETERS) IMPLANT
CONT SPEC 4OZ CLIKSEAL STRL BL (MISCELLANEOUS) ×1 IMPLANT
COUNTER NEEDLE 20 DBL MAG RED (NEEDLE) ×1 IMPLANT
CRADLE DONUT ADULT HEAD (MISCELLANEOUS) ×4 IMPLANT
DRAIN CHANNEL 28F RND 3/8 FF (WOUND CARE) ×4 IMPLANT
DRAPE CARDIOVASCULAR INCISE (DRAPES) ×4
DRAPE SLUSH/WARMER DISC (DRAPES) ×4 IMPLANT
DRAPE SRG 135X102X78XABS (DRAPES) ×3 IMPLANT
DRSG AQUACEL AG ADV 3.5X14 (GAUZE/BANDAGES/DRESSINGS) ×4 IMPLANT
ELECT BLADE 4.0 EZ CLEAN MEGAD (MISCELLANEOUS) ×4
ELECT CAUTERY BLADE 6.4 (BLADE) ×4 IMPLANT
ELECT REM PT RETURN 9FT ADLT (ELECTROSURGICAL) ×8
ELECTRODE BLDE 4.0 EZ CLN MEGD (MISCELLANEOUS) ×3 IMPLANT
ELECTRODE REM PT RTRN 9FT ADLT (ELECTROSURGICAL) ×6 IMPLANT
FELT TEFLON 1X6 (MISCELLANEOUS) ×9 IMPLANT
GAUZE SPONGE 4X4 12PLY STRL (GAUZE/BANDAGES/DRESSINGS) ×8 IMPLANT
GAUZE SPONGE 4X4 12PLY STRL LF (GAUZE/BANDAGES/DRESSINGS) ×2 IMPLANT
GLOVE BIO SURGEON STRL SZ 6.5 (GLOVE) ×16 IMPLANT
GLOVE BIO SURGEON STRL SZ7 (GLOVE) ×3 IMPLANT
GLOVE BIOGEL PI IND STRL 6.5 (GLOVE) IMPLANT
GLOVE BIOGEL PI INDICATOR 6.5 (GLOVE) ×4
GOWN STRL REUS W/ TWL LRG LVL3 (GOWN DISPOSABLE) ×12 IMPLANT
GOWN STRL REUS W/TWL LRG LVL3 (GOWN DISPOSABLE) ×16
HEMOSTAT POWDER SURGIFOAM 1G (HEMOSTASIS) ×12 IMPLANT
HEMOSTAT SURGICEL 2X14 (HEMOSTASIS) ×4 IMPLANT
INSERT FOGARTY XLG (MISCELLANEOUS) IMPLANT
KIT BASIN OR (CUSTOM PROCEDURE TRAY) ×4 IMPLANT
KIT CATH SUCT 8FR (CATHETERS) ×4 IMPLANT
KIT ROOM TURNOVER OR (KITS) ×4 IMPLANT
KIT SUCTION CATH 14FR (SUCTIONS) ×9 IMPLANT
KIT VASOVIEW HEMOPRO VH 3000 (KITS) ×4 IMPLANT
LEAD PACING MYOCARDI (MISCELLANEOUS) ×4 IMPLANT
LINE VENT (MISCELLANEOUS) ×1 IMPLANT
MARKER GRAFT CORONARY BYPASS (MISCELLANEOUS) ×12 IMPLANT
NS IRRIG 1000ML POUR BTL (IV SOLUTION) ×20 IMPLANT
PACK OPEN HEART (CUSTOM PROCEDURE TRAY) ×4 IMPLANT
PAD ARMBOARD 7.5X6 YLW CONV (MISCELLANEOUS) ×8 IMPLANT
PAD ELECT DEFIB RADIOL ZOLL (MISCELLANEOUS) ×4 IMPLANT
PENCIL BUTTON HOLSTER BLD 10FT (ELECTRODE) ×4 IMPLANT
PUNCH AORTIC ROTATE  4.5MM 8IN (MISCELLANEOUS) ×1 IMPLANT
SET CARDIOPLEGIA MPS 5001102 (MISCELLANEOUS) ×1 IMPLANT
SOLUTION ANTI FOG 6CC (MISCELLANEOUS) ×1 IMPLANT
SPONGE LAP 18X18 X RAY DECT (DISPOSABLE) ×4 IMPLANT
SUT BONE WAX W31G (SUTURE) ×4 IMPLANT
SUT ETHIBON 2 0 V 52N 30 (SUTURE) ×10 IMPLANT
SUT ETHIBOND 2 0 SH (SUTURE) ×20
SUT ETHIBOND 2 0 SH 36X2 (SUTURE) IMPLANT
SUT PROLENE 3 0 RB 1 (SUTURE) ×4 IMPLANT
SUT PROLENE 3 0 SH1 36 (SUTURE) ×5 IMPLANT
SUT PROLENE 4 0 RB 1 (SUTURE) ×8
SUT PROLENE 4 0 TF (SUTURE) ×8 IMPLANT
SUT PROLENE 4-0 RB1 .5 CRCL 36 (SUTURE) IMPLANT
SUT PROLENE 5 0 C 1 36 (SUTURE) ×2 IMPLANT
SUT PROLENE 6 0 C 1 30 (SUTURE) ×2 IMPLANT
SUT PROLENE 6 0 CC (SUTURE) ×8 IMPLANT
SUT PROLENE 7 0 BV1 MDA (SUTURE) ×4 IMPLANT
SUT PROLENE 7.0 RB 3 (SUTURE) ×2 IMPLANT
SUT PROLENE 8 0 BV175 6 (SUTURE) ×4 IMPLANT
SUT SILK  1 MH (SUTURE) ×3
SUT SILK 1 MH (SUTURE) IMPLANT
SUT SILK 1 TIES 10X30 (SUTURE) ×1 IMPLANT
SUT SILK 2 0 SH CR/8 (SUTURE) ×2 IMPLANT
SUT SILK 2 0 TIES 10X30 (SUTURE) ×1 IMPLANT
SUT SILK 2 0 TIES 17X18 (SUTURE) ×4
SUT SILK 2-0 18XBRD TIE BLK (SUTURE) IMPLANT
SUT SILK 3 0 SH CR/8 (SUTURE) ×1 IMPLANT
SUT SILK 4 0 TIE 10X30 (SUTURE) ×2 IMPLANT
SUT STEEL 6MS V (SUTURE) ×4 IMPLANT
SUT STEEL SZ 6 DBL 3X14 BALL (SUTURE) ×4 IMPLANT
SUT TEM PAC WIRE 2 0 SH (SUTURE) ×4 IMPLANT
SUT VIC AB 1 CTX 18 (SUTURE) ×8 IMPLANT
SUT VIC AB 2-0 CT1 27 (SUTURE) ×4
SUT VIC AB 2-0 CT1 TAPERPNT 27 (SUTURE) IMPLANT
SUT VIC AB 2-0 CTX 27 (SUTURE) ×1 IMPLANT
SUT VIC AB 3-0 X1 27 (SUTURE) ×1 IMPLANT
SUTURE E-PAK OPEN HEART (SUTURE) ×4 IMPLANT
SYSTEM SAHARA CHEST DRAIN ATS (WOUND CARE) ×4 IMPLANT
TAPE CLOTH SURG 4X10 WHT LF (GAUZE/BANDAGES/DRESSINGS) ×1 IMPLANT
TAPE PAPER 3X10 WHT MICROPORE (GAUZE/BANDAGES/DRESSINGS) ×1 IMPLANT
TOWEL GREEN STERILE (TOWEL DISPOSABLE) ×13 IMPLANT
TOWEL GREEN STERILE FF (TOWEL DISPOSABLE) ×8 IMPLANT
TOWEL OR 17X24 6PK STRL BLUE (TOWEL DISPOSABLE) ×6 IMPLANT
TOWEL OR 17X26 10 PK STRL BLUE (TOWEL DISPOSABLE) ×6 IMPLANT
TRAY FOLEY SILVER 16FR TEMP (SET/KITS/TRAYS/PACK) ×4 IMPLANT
TUBING INSUFFLATION (TUBING) ×4 IMPLANT
UNDERPAD 30X30 (UNDERPADS AND DIAPERS) ×4 IMPLANT
VALVE MAGNA EASE AORTIC 23MM (Prosthesis & Implant Heart) ×1 IMPLANT
VENT LEFT HEART 12002 (CATHETERS) ×8
WATER STERILE IRR 1000ML POUR (IV SOLUTION) ×8 IMPLANT

## 2016-09-05 NOTE — OR Nursing (Signed)
[id number] chest incision made

## 2016-09-05 NOTE — Anesthesia Procedure Notes (Signed)
Arterial Line Insertion Start/End7/30/2018 6:55 AM, 09/05/2016 7:08 AM Performed by: Carmela RimaMARTINELLI, Nezzie Manera F, CRNA  Patient location: Pre-op. Preanesthetic checklist: patient identified and timeout performed Patient sedated Left, radial was placed Catheter size: 20 G Hand hygiene performed  and maximum sterile barriers used   Attempts: 2 Following insertion, dressing applied and Biopatch. Post procedure assessment: normal and unchanged  Patient tolerated the procedure well with no immediate complications.

## 2016-09-05 NOTE — Anesthesia Postprocedure Evaluation (Signed)
Anesthesia Post Note  Patient: [name]  Procedure(s) Performed: Procedure(s) (LRB): AORTIC VALVE REPLACEMENT (AVR) USING 23 MM MAGNA EASE PERICARDIAL TISSUE VALVE (N/A) TRANSESOPHAGEAL ECHOCARDIOGRAM (TEE) (N/A) CORONARY ARTERY BYPASS GRAFTING (CABG) USING LIMA TO DISTAL LAD AND ENDOSCOPICALLY HARVESTED GREATER SAPHENOUS VEIN TO CIRC. (N/A) ENDOVEIN HARVEST OF GREATER SAPHENOUS VEIN (Right)     Patient location during evaluation: SICU Anesthesia Type: General Level of consciousness: sedated and patient remains intubated per anesthesia plan Vital Signs Assessment: post-procedure vital signs reviewed and stable Respiratory status: patient on ventilator - see flowsheet for VS and patient remains intubated per anesthesia plan Cardiovascular status: blood pressure returned to baseline Anesthetic complications: no    Last Vitals:  Vitals:   09/05/16 1458 09/05/16 1500  BP: (!) 81/46 100/67  Pulse: 90 89  Resp: 12 (!) 3  Temp:  (!) 36.3 C    Last Pain:  Vitals:   09/05/16 1500  TempSrc: Core (Comment)                 Brithney Bensen COKER

## 2016-09-05 NOTE — Anesthesia Procedure Notes (Addendum)
Central Venous Catheter Insertion Performed by: Roberts Gaudy, anesthesiologist Start/End7/30/2018 7:00 AM, 09/05/2016 7:10 AM Patient location: Pre-op. Preanesthetic checklist: patient identified, IV checked, site marked, risks and benefits discussed, surgical consent, monitors and equipment checked, pre-op evaluation and timeout performed Position: supine Hand hygiene performed  and maximum sterile barriers used  Catheter size: 9 Fr PA cath was placed.MAC introducer Procedure performed using ultrasound guided technique. Ultrasound Notes:anatomy identified, needle tip was noted to be adjacent to the nerve/plexus identified and no ultrasound evidence of intravascular and/or intraneural injection Attempts: 1 Following insertion, line sutured and dressing applied. Post procedure assessment: blood return through all ports, free fluid flow and no air  Patient tolerated the procedure well with no immediate complications.

## 2016-09-05 NOTE — H&P (Signed)
[address]       [name]             [phone]                    [name] [facility] Medical Record #[id number] Date of Birth: 1950/04/20  Referring:Koneswaran, Ottie Glazier, MD Primary Care: [name], MD  Chief Complaint:     Chief Complaint  Patient presents with  . Aortic Stenosis        History of Present Illness:    [name] 66 y.o. male  Seen in the office for Evaluation of aortic stenosis. In late December 2017 the patient went to the emergency room with pneumonia symptoms, cough shortness of breath and chest discomfort. Initially went [facility] and when troponins were elevated to 1.640 was [facility] and underwent cardiac catheterization. On exam and echocardiogram he was noted to have critical aortic stenosis. In addition he has a 4.7 cm abdominal aortic aneurysm last measured on 07/28/2016 ultrasound.  The patient comes in now noting increasing symptoms he notes increasing chest discomfort and shortness of breath with exertion over the past several months, he denies syncope or near-syncope.  He has a previous smoker but quit 15 years ago   Current Activity/ Functional Status:  Patient is independent with mobility/ambulation, transfers, ADL's, IADL's.   Zubrod Score: At the time of surgery this patient's most appropriate activity status/level should be described as: []     0    Normal activity, no symptoms [x]     1    Restricted in physical strenuous activity but ambulatory, able to do out light work []     2    Ambulatory and capable of self care, unable to do work activities, up and about               >50 % of waking hours                              []     3    Only limited self care, in bed greater than 50% of waking hours []     4    Completely disabled, no self care, confined to bed or chair []     5    Moribund   Past Medical History:  Diagnosis Date  . AAA (abdominal aortic aneurysm)  (HCC)   . COPD (chronic obstructive pulmonary disease) (HCC)   . Fatty liver   . Hyperlipidemia   . Hypertension   . Severe aortic stenosis 08/29/2016  . White coat hypertension     Past Surgical History:  Procedure Laterality Date  . ANKLE SURGERY Right 2009  . APPENDECTOMY    . CARDIAC CATHETERIZATION N/A 02/02/2016   Procedure: Left Heart Cath and Coronary Angiography;  Surgeon: Rinaldo Cloud, MD;  Location: [facility] INVASIVE CV LAB;  Service: Cardiovascular;  Laterality: N/A;  . COLONOSCOPY  06/22/2010   ION:[id number] rectal and right colon polyps removed remainder rectum and colon appared normal  . COLONOSCOPY WITH PROPOFOL N/A 05/15/2014   Procedure: COLONOSCOPY WITH PROPOFOL;  Surgeon: Corbin Ade, MD;  Location: [facility] ORS;  Service: Endoscopy;  Laterality: N/A;  In cecum @ [id number], out @ 0937, withdrawal time 19 minutes  . POLYPECTOMY N/A 05/15/2014   Procedure: POLYPECTOMY;  Surgeon: Corbin Ade, MD;  Location: [facility];  Service: Endoscopy;  Laterality:  N/A;  . RIGHT/LEFT HEART CATH AND CORONARY ANGIOGRAPHY N/A 08/29/2016   Procedure: Right/Left Heart Cath and Coronary Angiography;  Surgeon: Tonny Bollman, MD;  Location: [facility] INVASIVE CV LAB;  Service: Cardiovascular;  Laterality: N/A;    Family History  Problem Relation Age of Onset  . Hypertension Sister   . AAA (abdominal aortic aneurysm) Brother   . Colon cancer Neg Hx   . Liver disease Neg Hx     Social History   Social History  . Marital status: Married    Spouse name: N/A  . Number of children: N/A  . Years of education: N/A   Occupational History  . Not on file.   Social History Main Topics  . Smoking status: Former Smoker    Packs/day: 1.50    Years: 20.00    Types: Cigarettes    Quit date: 04/16/2002  . Smokeless tobacco: Never Used  . Alcohol use 3.0 oz/week    5 Cans of beer per week     Comment: per pt  . Drug use: No  . Sexual activity: No   Other Topics Concern  . Not on file   Social History  Narrative  . No narrative on file    History  Smoking Status  . Former Smoker  . Packs/day: 1.50  . Years: 20.00  . Types: Cigarettes  . Quit date: 04/16/2002  Smokeless Tobacco  . Never Used    History  Alcohol Use  . 3.0 oz/week  . 5 Cans of beer per week    Comment: per pt     No Known Allergies  Current Facility-Administered Medications  Medication Dose Route Frequency Provider Last Rate Last Dose  . cefUROXime (ZINACEF) 1.5 g in dextrose 5 % 50 mL IVPB  1.5 g Intravenous To OR Delight Ovens, MD      . cefUROXime (ZINACEF) 750 mg in dextrose 5 % 50 mL IVPB  750 mg Intravenous To OR Delight Ovens, MD      . dexmedetomidine (PRECEDEX) 400 MCG/100ML (4 mcg/mL) infusion  0.1-0.7 mcg/kg/hr Intravenous To OR Delight Ovens, MD      . DOPamine (INTROPIN) 800 mg in dextrose 5 % 250 mL (3.2 mg/mL) infusion  0-10 mcg/kg/min Intravenous To OR Delight Ovens, MD      . EPINEPHrine (ADRENALIN) 4 mg in dextrose 5 % 250 mL (0.016 mg/mL) infusion  0-10 mcg/min Intravenous To OR Delight Ovens, MD      . heparin 2,500 Units, papaverine 30 mg in electrolyte-148 (PLASMALYTE-148) 500 mL irrigation   Irrigation To OR Delight Ovens, MD      . heparin 30,000 units/NS 1000 mL solution for CELLSAVER   Other To OR Delight Ovens, MD      . insulin regular (NOVOLIN R,HUMULIN R) 100 Units in sodium chloride 0.9 % 100 mL (1 Units/mL) infusion   Intravenous To OR Delight Ovens, MD      . magnesium sulfate (IV Push/IM) injection 40 mEq  40 mEq Other To OR Delight Ovens, MD      . metoprolol tartrate (LOPRESSOR) tablet 12.5 mg  12.5 mg Oral Once Delight Ovens, MD      . nitroGLYCERIN 50 mg in dextrose 5 % 250 mL (0.2 mg/mL) infusion  2-200 mcg/min Intravenous To OR Delight Ovens, MD      . phenylephrine (NEO-SYNEPHRINE) 20 mg in sodium chloride 0.9 % 250 mL (0.08 mg/mL) infusion  30-200 mcg/min Intravenous To  OR Delight Ovens, MD      . potassium  chloride injection 80 mEq  80 mEq Other To OR Delight Ovens, MD      . tranexamic acid (CYKLOKAPRON) 2,500 mg in sodium chloride 0.9 % 250 mL (10 mg/mL) infusion  1.5 mg/kg/hr Intravenous To OR Delight Ovens, MD      . tranexamic acid (CYKLOKAPRON) bolus via infusion - over 30 minutes 1,293 mg  15 mg/kg Intravenous To OR Delight Ovens, MD      . tranexamic acid (CYKLOKAPRON) pump prime solution 172 mg  2 mg/kg Intracatheter To OR Delight Ovens, MD      . vancomycin (VANCOCIN) 1,500 mg in sodium chloride 0.9 % 250 mL IVPB  1,500 mg Intravenous To OR Delight Ovens, MD   1,500 mg at 09/05/16 1610   Facility-Administered Medications Ordered in Other Encounters  Medication Dose Route Frequency Provider Last Rate Last Dose  . fentaNYL (SUBLIMAZE) injection   Intravenous Anesthesia Intra-op Carmela Rima, CRNA   50 mcg at 09/05/16 [phone]  . lactated ringers infusion    Continuous PRN Carmela Rima, CRNA      . lactated ringers infusion    Continuous PRN Carmela Rima, CRNA      . lactated ringers infusion    Continuous PRN Carmela Rima, CRNA      . midazolam (VERSED) 5 MG/5ML injection    Anesthesia Intra-op Carmela Rima, CRNA   2 mg at 09/05/16 5409    Pertinent items are noted in HPI.   Review of Systems:     Cardiac Review of Systems: Y or N  Chest Pain [  y  ]  Resting SOB [ n  ] Exertional SOB  Cove.Etienne  ]  Orthopnea [ n ]   Pedal Edema [ n  ]    Palpitations [  n] Syncope  [ n ]   Presyncope [ n  ]  General Review of Systems: [Y] = yes [  ]=no Constitional: recent weight change [  ];  Wt loss over the last 3 months [   ] anorexia [  ]; fatigue [  ]; nausea [  ]; night sweats [  ]; fever [  n]; or chills [ n ];          Dental: poor dentition[  ]; Last Dentist visit:   Eye : blurred vision [  ]; diplopia [   ]; vision changes [  ];  Amaurosis fugax[  ]; Resp: cough [  ];  wheezing[n  ];  hemoptysis[n  ]; shortness of breath[  ]; paroxysmal nocturnal  dyspnea[  ]; dyspnea on exertion[  ]; or orthopnea[  ];  GI:  gallstones[  ], vomiting[  ];  dysphagia[  ]; melena[  ];  hematochezia [  ]; heartburn[  ];   Hx of  Colonoscopy[y  ]; GU: kidney stones [  ]; hematuria[  ];   dysuria [  ];  nocturia[  ];  history of     obstruction [  ]; urinary frequency [  ]             Skin: rash, swelling[  ];, hair loss[  ];  peripheral edema[  ];  or itching[  ]; Musculosketetal: myalgias[ n ];  joint swelling[  ];  joint erythema[  ];  joint pain[  ];  back pain[  ];  Heme/Lymph: bruising[  ];  bleeding[  ];  anemia[  ];  Neuro: TIA[ n ];  headaches[n  ];  stroke[n  ];  vertigo[  ];  seizures[  ];   paresthesias[  ];  difficulty walking[ n ];  Psych:depression[  ]; anxiety[  ];  Endocrine: diabetes[  ];  thyroid dysfunction[  ];  Immunizations: Flu up to date Cove.Etienne  ]; Pneumococcal up to date Cove.Etienne  ];  Other:  Physical Exam: BP (!) 141/82   Pulse 62   Temp 98 F (36.7 C) (Oral)   Resp 20   Wt 190 lb (86.2 kg)   SpO2 95%   BMI 28.06 kg/m   PHYSICAL EXAMINATION: General appearance: alert, cooperative, appears stated age and no distress Head: Normocephalic, without obvious abnormality, atraumatic Neck: no adenopathy, no carotid bruit, no JVD, supple, symmetrical, trachea midline and thyroid not enlarged, symmetric, no tenderness/mass/nodules Lymph nodes: Cervical, supraclavicular, and axillary nodes normal. Resp: clear to auscultation bilaterally Back: symmetric, no curvature. ROM normal. No CVA tenderness. Cardio: systolic murmur: early systolic 3/6, crescendo at lower left sternal border GI: soft, non-tender; bowel sounds normal; no masses,  no organomegaly Extremities: extremities normal, atraumatic, no cyanosis or edema and Homans sign is negative, no sign of DVT Neurologic: Grossly normal  Diagnostic Studies & Laboratory data:     Recent Radiology Findings:   Ct Angio Chest Aorta W &/or Wo Contrast  Result Date: 08/13/2016 CLINICAL DATA:   Aortic stenosis, aortic regurgitation and bicuspid aortic valve. History of nonobstructive coronary artery disease. History of 4.7 cm abdominal aortic aneurysm. EXAM: CT ANGIOGRAPHY CHEST WITH CONTRAST TECHNIQUE: Multidetector CT imaging of the chest was performed using the standard protocol during bolus administration of intravenous contrast. Multiplanar CT image reconstructions and MIPs were obtained to evaluate the vascular anatomy. CONTRAST:  100 mL Isovue 370 IV COMPARISON:  Prior CTA of the chest on 01/12/2015 FINDINGS: Cardiovascular: Aorta: The aortic valve is heavily calcified. By a non-gated CTA, it is not entirely possible to delineate bicuspid versus tricuspid anatomy. Based on configuration of aortic sinuses, it is suspected that the valve is likely bicuspid. There is no associated aneurysmal disease of the thoracic aorta. The aorta measures 3 cm at the sinuses of Valsalva. The ascending thoracic aorta measures 3.7 cm in greatest diameter. The proximal arch measures 3 cm. The distal arch measures 2.9 cm. The descending thoracic aorta measures 2.5 cm. No evidence of aortic dissection. Proximal great vessels show normal patency. There is calcified plaque in the left subclavian artery which approaches the vertebral artery origin but does not appear to cause significant vertebral artery stenosis. Visualized upper abdominal aorta demonstrates scattered plaque. Proximal celiac axis demonstrates approximately 25-30% stenosis. The origin of the superior mesenteric artery demonstrates minimal plaque without significant stenosis. Aneurysmal disease of the abdominal aorta is not visualized on this study. Cardiac/coronary: The heart size is normal. No overt cardiac chamber dilatation. Thinned myocardium at the left ventricular apex may relate to prior infarction. No evidence to suggest ventricular aneurysm. Coronary atherosclerosis noted with calcified plaque in the distribution of the LAD as well as the RCA. No  pericardial fluid identified. Pulmonary: Central pulmonary arteries are well opacified and show normal caliber without filling defect. Pulmonary venous anatomy is normal. The left atrial appendage is normally patent. Mediastinum/Nodes: No enlarged mediastinal, hilar, or axillary lymph nodes. Thyroid gland, trachea, and esophagus demonstrate no significant findings. Lungs/Pleura: There is some degree of emphysematous disease, primarily affecting the upper lung zones and more prominently in the right lung compared to the left.  Some scattered areas of mild parenchymal scarring are noted bilaterally. There is no evidence of pulmonary edema, consolidation, pneumothorax, nodule or pleural fluid. Upper Abdomen: No acute abnormality. Musculoskeletal: No chest wall abnormality. No acute or significant osseous findings. Review of the MIP images confirms the above findings. IMPRESSION: 1. Heavily calcified aortic valve with likely bicuspid anatomy. No evidence of associated aneurysmal disease of the thoracic aorta with maximal caliber of the ascending thoracic aorta of 3.7 cm. 2. Coronary atherosclerosis with calcified plaque noted in the distribution of the LAD and RCA. Thinned myocardium at the left ventricular apex may relate to prior infarction. 3. Emphysematous lung disease without acute pulmonary findings. Electronically Signed   By: Irish Lack M.D.   On: 08/13/2016 08:55     I have independently reviewed the above radiologic studies.   Procedures   Left Heart Cath and Coronary Angiography  Conclusion    Prox RCA lesion, 40 %stenosed.  Mid RCA lesion, 30 %stenosed.  Ost Cx to Prox Cx lesion, 40 %stenosed.  Prox LAD lesion, 20 %stenosed.  Mid LAD lesion, 40 %stenosed.    Indications   Acute non Q wave myocardial infarction (HCC) [I21.4 (ICD-10-CM)]  Procedural Details/Technique   Technical Details After obtaining the informed consent patient was brought to the Cath Lab and was placed on  fluoroscopy table. Right groin was prepped and draped in usual fashion. 1% Xylocaine was used for local anesthesia in the right groin. Patient received 1 mg of Versed and 25 g of IV fentanyl for conscious sedation. Patient was monitored during the conscious sedation total conscious sedation time was 49 minutes. With the help of thinwall needle 5 French artery sheath was placed. Sheath was aspirated and flushed. Next 5 French left Judkins catheter was advanced over the wire under fluoroscopic guidance up to the ascending aorta wire was pulled out the catheter was aspirated and connected to the manifold cath was further advanced and engaged into left coronary ostium multiple views of the left system were taken next the catheter was disengaged and was pulled out over the wire and was replaced with 5 French right Judkins catheter which was advanced over the wire under fluoroscopic guidance up to the ascending aorta wire was pulled out the cath was aspirated and connected to the manifold cath was further advanced and engaged into right coronary ostium multiple views of the right system were taken next the catheter was disengaged and was pulled out over the wire and was replaced with 5 French angled pigtail catheter which was advanced over the wire under fluoroscopic guidance up to the ascending aorta wire was pulled out the catheter was aspirated and connected to the manifold catheter was further advanced and attempted to cross the aortic valve without success this catheter was then switched to straight pigtail catheter which was advanced over the wire under fluoroscopic guidance up to the ascending aorta wire was pulled out the catheter was aspirated and connected to the manifold cath was further attempted to advance across aortic valve without success this catheter was switched to right Judkins catheter which was also unsuccessful to cross the aortic valve. Patient tolerated the procedure well there were no  complications Finding LV not done Left main has 20-30% distal stenosis LAD has 20-30% proximal and mid sequential stenosis and 40% bifurcation stenosis with diagonal to diagonal 1 and 2 were patent Ramus was very very small which was patent Left circumflex as 30-40% proximal stenosis with haziness with TIMI 3 distal flow OM1 was  very small which was diffusely diseased distally OM 2 was moderate size which was patent RCA has proximal and mid 30-40% stenosis PDA and PLV branches were patent. Patient tolerated procedure well there no complications Plan We will get 2-D echo to evaluate LV systolic function and wall motion and rule out aortic stenosis.   Estimated blood loss <50 mL.  During this procedure the patient was administered the following to achieve and maintain moderate conscious sedation: Versed 1 mg, Fentanyl 25 mcg, while the patient's heart rate, blood pressure, and oxygen saturation were continuously monitored. The period of conscious sedation was 49 minutes, of which I was present face-to-face 100% of this time.    Coronary Findings   Dominance: Right  Left Anterior Descending  Prox LAD lesion, 20% stenosed.  Mid LAD lesion, 40% stenosed.  Ramus Intermedius  Vessel is small.  Left Circumflex  Ost Cx to Prox Cx lesion, 40% stenosed.  First Obtuse Marginal Branch  Vessel is small in size.  Right Coronary Artery  Prox RCA lesion, 40% stenosed.  Mid RCA lesion, 30% stenosed.  Coronary Diagrams   Diagnostic Diagram       Implants          Hemo Data    Most Recent Value  AO Systolic Pressure 111 mmHg  AO Diastolic Pressure 73 mmHg  AO Mean 89 mmHg   I have independently reviewed the above  cath films and reviewed the findings with the  patient .   Recent Lab Findings: Lab Results  Component Value Date   WBC 6.2 09/01/2016   HGB 14.3 09/01/2016   HCT 42.7 09/01/2016   PLT 189 09/01/2016   GLUCOSE 103 (H) 09/01/2016   CHOL 136 05/16/2016   TRIG 225  (H) 05/16/2016   HDL 35 (L) 05/16/2016   LDLCALC 56 05/16/2016   ALT 18 09/01/2016   AST 29 09/01/2016   NA 137 09/01/2016   K 4.1 09/01/2016   CL 104 09/01/2016   CREATININE 0.50 (L) 09/01/2016   BUN 8 09/01/2016   CO2 23 09/01/2016   INR 1.00 09/01/2016   HGBA1C 5.8 (H) 09/01/2016    ECHO:  [facility]  [address]  [place], [place] [address]  [phone]  -------------------------------------------------------------------  Transthoracic Echocardiography  Patient: [name], [name]  MR #: [id number]  Study Date: 06/22/2016  Gender: M  Age: 61  Height: 175.3 cm  Weight: 86.2 kg  BSA: 2.07 m^2  Pt. Status:  Room:  SONOGRAPHER [facility]  ATTENDING Prentice Docker, MD  ORDERING Prentice Docker, MD  REFERRING Prentice Docker, MD  PERFORMING [place]  cc:  -------------------------------------------------------------------  LV EF: 60% - 65%  -------------------------------------------------------------------  Indications: Aortic stenosis 424.1.  -------------------------------------------------------------------  History: PMH: Former Smoker. Aortic valve disease. Chronic  obstructive pulmonary disease. PMH: Myocardial infarction. Risk  factors: Dyslipidemia.  -------------------------------------------------------------------  Study Conclusions  - Left ventricle: The cavity size was normal. Wall thickness was  increased in a pattern of mild LVH. Systolic function was normal.  The estimated ejection fraction was in the range of 60% to 65%.  Wall motion was normal; there were no regional wall motion  abnormalities. Left ventricular diastolic function parameters  were normal for the patient&'s age.  - Aortic valve: Leaflet structure difficult to identify, would  suspect bicuspid. There was severe stenosis. There was mild  regurgitation. Mean gradient (S): 48 mm Hg. Peak gradient (S): 83  mm Hg. VTI ratio of LVOT to aortic  valve: 0.29. Valve area (VTI):  0.9 cm^2.  - Mitral valve: Calcified annulus.  - Right atrium: Central venous pressure (est): 3 mm Hg.  - Tricuspid valve: There was physiologic regurgitation.  - Pulmonary arteries: Systolic pressure could not be accurately  estimated.  - Pericardium, extracardiac: A prominent pericardial fat pad was  present.  Impressions:  - Mild LVH with LVEF 60-65% and normal diastolic function for age.  Mildly calcified mitral annulus. Severe calcific aortic stenosis  as outlined above with mild aortic regurgitation. Transvalvular  gradients have increased compared to the prior study in December  2017.  -------------------------------------------------------------------  Study data: Comparison was made to the study of 02/03/2016. Study  status: Routine. Procedure: Transthoracic echocardiography.  Image quality was adequate. Study completion: There were no  complications. Transthoracic echocardiography. M-mode,  complete 2D, spectral Doppler, and color Doppler. Birthdate:  Patient birthdate: 09/18/1950. Age: Patient is 66 yr old. Sex:  Gender: male. BMI: 28 kg/m^2. Blood pressure: 142/81  Patient status: Outpatient. Study date: Study date: 06/22/2016.  Study time: 11:24 AM. Location: Echo laboratory.  -------------------------------------------------------------------  -------------------------------------------------------------------  Left ventricle: The cavity size was normal. Wall thickness was  increased in a pattern of mild LVH. Systolic function was normal.  The estimated ejection fraction was in the range of 60% to 65%.  Wall motion was normal; there were no regional wall motion  abnormalities. Left ventricular diastolic function parameters were  normal for the patient&'s age.  -------------------------------------------------------------------  Aortic valve: Severely calcified leaflets. Leaflet structure  difficult to identify, would suspect bicuspid.  Doppler: There  was severe stenosis. There was mild regurgitation. VTI ratio  of LVOT to aortic valve: 0.29. Valve area (VTI): 0.9 cm^2. Indexed  valve area (VTI): 0.44 cm^2/m^2. Peak velocity ratio of LVOT to  aortic valve: 0.22. Valve area (Vmax): 0.7 cm^2. Indexed valve area  (Vmax): 0.34 cm^2/m^2. Mean velocity ratio of LVOT to aortic valve:  0.23. Valve area (Vmean): 0.71 cm^2. Indexed valve area (Vmean):  0.34 cm^2/m^2. Mean gradient (S): 48 mm Hg. Peak gradient (S):  83 mm Hg.  -------------------------------------------------------------------  Aorta: Aortic root: The aortic root was normal in size.  -------------------------------------------------------------------  Mitral valve: Calcified annulus. Doppler: There was no  significant regurgitation.  -------------------------------------------------------------------  Left atrium: The atrium was normal in size.  -------------------------------------------------------------------  Right ventricle: The cavity size was normal. Systolic function was  normal.  -------------------------------------------------------------------  Pulmonic valve: Poorly visualized. The valve appears to be  grossly normal. Doppler: There was trivial regurgitation.  -------------------------------------------------------------------  Tricuspid valve: The valve appears to be grossly normal.  Doppler: There was physiologic regurgitation.  -------------------------------------------------------------------  Pulmonary artery: Systolic pressure could not be accurately  estimated.  -------------------------------------------------------------------  Right atrium: The atrium was normal in size.  -------------------------------------------------------------------  Pericardium: A prominent pericardial fat pad was present. There  was no pericardial effusion.  -------------------------------------------------------------------  Systemic veins:  Inferior vena  cava: The vessel was normal in size. The  respirophasic diameter changes were in the normal range (>= 50%),  consistent with normal central venous pressure.  -------------------------------------------------------------------  Measurements  Left ventricle Value Reference  LV ID, ED, PLAX chordal (L) 39.5 mm 43 - 52  LV ID, ES, PLAX chordal 24.4 mm 23 - 38  LV fx shortening, PLAX chordal 38 % >=29  LV PW thickness, ED 11.6 mm ----------  IVS/LV PW ratio, ED 0.87 <=1.3  Stroke volume, 2D 104 ml ----------  Stroke volume/bsa, 2D 50 ml/m^2 ----------  LV ejection fraction, 1-p A4C 73 % ----------  LV end-diastolic volume, 2-p 94 ml ----------  LV end-systolic volume, 2-p 28 ml ----------  LV ejection fraction, 2-p 70 % ----------  Stroke volume, 2-p 66 ml ----------  LV end-diastolic volume/bsa, 2-p 46 ml/m^2 ----------  LV end-systolic volume/bsa, 2-p 13 ml/m^2 ----------  Stroke volume/bsa, 2-p 32 ml/m^2 ----------  LV e&', lateral 9.25 cm/s ----------  LV E/e&', lateral 6.81 ----------  LV e&', medial 7.51 cm/s ----------  LV E/e&', medial 8.39 ----------  LV e&', average 8.38 cm/s ----------  LV E/e&', average 7.52 ----------  Ventricular septum Value Reference  IVS thickness, ED 10.1 mm ----------  LVOT Value Reference  LVOT ID, S 20 mm ----------  LVOT area 3.14 cm^2 ----------  LVOT peak velocity, S 101 cm/s ----------  LVOT mean velocity, S 70.4 cm/s ----------  LVOT VTI, S 33.1 cm ----------  LVOT peak gradient, S 4 mm Hg ----------  Aortic valve Value Reference  Aortic valve peak velocity, S 456 cm/s ----------  Aortic valve mean velocity, S 312 cm/s ----------  Aortic valve VTI, S 115 cm ----------  Aortic mean gradient, S 48 mm Hg ----------  Aortic peak gradient, S 83 mm Hg ----------  VTI ratio, LVOT/AV 0.29 ----------  Aortic valve area, VTI 0.9 cm^2 ----------  Aortic valve area/bsa, VTI 0.44 cm^2/m^2 ----------  Velocity ratio, peak, LVOT/AV 0.22 ----------    Aortic valve area, peak velocity 0.7 cm^2 ----------  Aortic valve area/bsa, peak 0.34 cm^2/m^2 ----------  velocity  Velocity ratio, mean, LVOT/AV 0.23 ----------  Aortic valve area, mean velocity 0.71 cm^2 ----------  Aortic valve area/bsa, mean 0.34 cm^2/m^2 ----------  velocity  Aortic regurg pressure half-time 578 ms ----------  Aorta Value Reference  Aortic root ID, ED 31 mm ----------  Left atrium Value Reference  LA ID, A-P, ES 34 mm ----------  LA ID/bsa, A-P 1.65 cm/m^2 <=2.2  LA volume, ES, 1-p A4C 30.6 ml ----------  LA volume/bsa, ES, 1-p A4C 14.8 ml/m^2 ----------  LA volume, ES, 1-p A2C 47.7 ml ----------  LA volume/bsa, ES, 1-p A2C 23.1 ml/m^2 ----------  Mitral valve Value Reference  Mitral E-wave peak velocity 63 cm/s ----------  Mitral A-wave peak velocity 72.2 cm/s ----------  Mitral deceleration time (H) 349 ms 150 - 230  Mitral E/A ratio, peak 0.9 ----------  Right atrium Value Reference  RA ID, S-I, ES, A4C (H) 58.2 mm 34 - 49  RA area, ES, A4C 17.3 cm^2 8.3 - 19.5  RA volume, ES, A/L 44.5 ml ----------  RA volume/bsa, ES, A/L 21.5 ml/m^2 ----------  Systemic veins Value Reference  Estimated CVP 3 mm Hg ----------  Right ventricle Value Reference  RV ID, ED, PLAX 24.4 mm 19 - 38  TAPSE 24.9 mm ----------  RV s&', lateral, S 11.2 cm/s ----------  Legend:  (L) and (H) mark values outside specified reference range.  -------------------------------------------------------------------  Prepared and Electronically Authenticated by  Nona Dell, M.D.  2018-05-17T08:51:27   Cath: Procedures   Right/Left Heart Cath and Coronary Angiography  Conclusion   1. Severe aortic stenosis with a mean transaortic gradient of 41 mmHg 2. Severe stenosis of the proximal circumflex 3. Moderately severe focal stenosis of the mid-LAD 4. Diffuse nonobstructive RCA stenosis  Will review films with Dr Tyrone Sage. Consideration of grafting the LCx and LAD  distributions at the time of AVR  Indications   Severe aortic stenosis [I35.0 (ICD-10-CM)]  Procedural Details/Technique   Technical Details INDICATION: Severe aortic stenosis. Known moderate CAD. Preoperative study with planned AVR next week.  Pt with exertional angina and dyspnea.   PROCEDURAL DETAILS: There was an indwelling IV in a right antecubital vein. Using normal sterile technique, the IV was changed out for a 5 Fr brachial sheath over a 0.018 inch wire. The right wrist was then prepped, draped, and anesthetized with 1% lidocaine. Using the modified Seldinger technique a 5/6 French Slender sheath was placed in the right radial artery. Intra-arterial verapamil was administered through the radial artery sheath. IV heparin was administered after a JR4 catheter was advanced into the central aorta. A Swan-Ganz catheter was used for the right heart catheterization. Standard protocol was followed for recording of right heart pressures and sampling of oxygen saturations. Fick cardiac output was calculated. Standard Judkins catheters were used for selective coronary angiography. A JR4 catheter is used to direct a J-tipped wire across the aortic valve and LV pressure is recorded. A pullback gradient is measured. There were no immediate procedural complications. The patient was transferred to the post catheterization recovery area for further monitoring.     Estimated blood loss <50 mL.  During this procedure the patient was administered the following to achieve and maintain moderate conscious sedation: Versed 2 mg, Fentanyl 25 mcg, while the patient's heart rate, blood pressure, and oxygen saturation were continuously monitored. The period of conscious sedation was 29 minutes, of which I was present face-to-face 100% of this time.    Coronary Findings   Dominance: Right  Left Main  Vessel is angiographically normal.  Left Anterior Descending  Prox LAD lesion, 40% stenosed.  Mid LAD lesion, 70%  stenosed. The lesion is eccentric. There is an eccentric focal lesion in the mid-LAD just beyond the second diagonal origin.  Left Circumflex  Prox Cx lesion, 80% stenosed. The lesion is eccentric. The lesion is moderately calcified. There is a tight eccentric lesion in the proximal LCx just beyond a small OM branch. The vessel supplies a large second OM branch beyond the lesion which appears suitable for grafting.  Right Coronary Artery  Large dominant vessel with a large PDA branch. Nonobstructive CAD is present.  Prox RCA lesion, 40% stenosed. The lesion is eccentric. The lesion is mildly calcified.  Mid RCA lesion, 40% stenosed.  Right Heart   Right Heart Pressures LV EDP is normal. Normal right heart pressures and normal LVEDP    Left Heart   Aortic Valve There is severe aortic valve stenosis. The aortic valve is calcified. There is restricted aortic valve motion. There is severe aortic stenosis with a peak-to-peak gradient of 65 mmHg and mean gradient of 41 mmHg, calculated AVA 0.84 square cm    Coronary Diagrams   Diagnostic Diagram       Implants     No implant documentation for this case.  PACS Images   Show images for Cardiac catheterization   Link to Procedure Log   Procedure Log    Hemo Data    Most Recent Value  Fick Cardiac Output 5.17 L/min  Fick Cardiac Output Index 2.56 (L/min)/BSA  Aortic Mean Gradient 40.6 mmHg  Aortic Peak Gradient 65 mmHg  Aortic Valve Area 0.84  Aortic Value Area Index 0.41 cm2/BSA  RA A Wave 4 mmHg  RA V Wave 4 mmHg  RA Mean 2 mmHg  RV Systolic Pressure 35 mmHg  RV Diastolic Pressure 0 mmHg  RV EDP 4 mmHg  PA Systolic Pressure 42 mmHg  PA Diastolic Pressure 12 mmHg  PA Mean 24 mmHg  PW A Wave 16 mmHg  PW V  Wave 14 mmHg  PW Mean 14 mmHg  AO Systolic Pressure 113 mmHg  AO Diastolic Pressure 67 mmHg  AO Mean 85 mmHg  LV Systolic Pressure 170 mmHg  LV Diastolic Pressure 3 mmHg  LV EDP 8 mmHg  Arterial Occlusion Pressure  Extended Systolic Pressure 108 mmHg  Arterial Occlusion Pressure Extended Diastolic Pressure 65 mmHg  Arterial Occlusion Pressure Extended Mean Pressure 83 mmHg  Left Ventricular Apex Extended Systolic Pressure 173 mmHg  Left Ventricular Apex Extended Diastolic Pressure 4 mmHg  Left Ventricular Apex Extended EDP Pressure 8 mmHg  QP/QS 1  TPVR Index 9.38 HRUI  TSVR Index 33.23 HRUI  PVR SVR Ratio 0.12  TPVR/TSVR Ratio 0.28     Assessment / Plan:     Patient with symptomatic critical aortic stenosis, with anginal symptoms, history of myocardial infarction in December 2017. Patient has recent dental checkup I recommend to the patient that we proceed with aortic valve replacement, and coronary artery bypass grafting after recent cardiac cath I have reviewed the patient's cine films and information with Dr. Excell Seltzerooper who agrees the patient is not a high risk surgical patient and would not be a candidate for TVAR. Since the cardiac catheterization was done in December of last year,Repeat cardiac catheterization  Was done last week . I discussed with the patient and his wife the pros and cons of both mechanical and tissue valve last week and again today . He wishes to have tissue tissue valve.  Echocardiogram the patient likely has a bicuspid aortic valve, CT scan of the chest recently does not show any evidence of enlargement of the  ascending aorta.  The goals risks and alternatives of the planned surgical procedure Procedure(s): AORTIC VALVE REPLACEMENT (AVR) (N/A) TRANSESOPHAGEAL ECHOCARDIOGRAM (TEE) (N/A) CORONARY ARTERY BYPASS GRAFTING (CABG) (N/A)  have been discussed with the patient in detail. The risks of the procedure including death, infection, stroke, myocardial infarction, bleeding, heart block, blood transfusion have all been discussed specifically.  I have quoted [name] a 3% of perioperative mortality and a complication rate as high as 40 %. The patient's questions have  been answered.[name] is willing  to proceed with the planned procedure.  Delight OvensEdward B Rolf Fells MD      [address] [place] [id number] Office [phone]   Beeper [phone]  09/05/2016 7:15 AM

## 2016-09-05 NOTE — Progress Notes (Signed)
  Echocardiogram 2D Echocardiogram has been performed.  Janalyn HarderWest, Bevin Das R 09/05/2016, 9:23 AM

## 2016-09-05 NOTE — Anesthesia Procedure Notes (Signed)
Procedure Name: Intubation Date/Time: 09/05/2016 7:49 AM Performed by: Neldon Newport Pre-anesthesia Checklist: Timeout performed, Patient being monitored, Suction available, Emergency Drugs available and Patient identified Patient Re-evaluated:Patient Re-evaluated prior to induction Oxygen Delivery Method: Circle system utilized Preoxygenation: Pre-oxygenation with 100% oxygen Induction Type: IV induction Ventilation: Mask ventilation without difficulty Laryngoscope Size: Mac and 3 Grade View: Grade II Tube type: Oral Tube size: 8.0 mm Number of attempts: 1 Placement Confirmation: breath sounds checked- equal and bilateral,  positive ETCO2 and ETT inserted through vocal cords under direct vision Secured at: 23 cm Tube secured with: Tape Dental Injury: Teeth and Oropharynx as per pre-operative assessment

## 2016-09-05 NOTE — Anesthesia Procedure Notes (Signed)
Anesthesia Procedure Note     

## 2016-09-05 NOTE — Anesthesia Preprocedure Evaluation (Addendum)
Anesthesia Evaluation  Patient identified by MRN, date of birth, ID band Patient awake    Reviewed: Allergy & Precautions, NPO status , Patient's Chart, lab work & pertinent test results  Airway Mallampati: II  TM Distance: >3 FB Neck ROM: full    Dental  (+) Teeth Intact, Dental Advidsory Given   Pulmonary COPD,  COPD inhaler, former smoker,    breath sounds clear to auscultation       Cardiovascular hypertension,  Rhythm:Regular Rate:Normal     Neuro/Psych    GI/Hepatic   Endo/Other    Renal/GU      Musculoskeletal   Abdominal   Peds  Hematology   Anesthesia Other Findings   Reproductive/Obstetrics                           Anesthesia Physical Anesthesia Plan  ASA: III  Anesthesia Plan: General   Post-op Pain Management:    Induction: Intravenous  PONV Risk Score and Plan: Ondansetron  Airway Management Planned: Oral ETT  Additional Equipment: 3D TEE, PA Cath, CVP and Arterial line  Intra-op Plan:   Post-operative Plan: Post-operative intubation/ventilation  Informed Consent: I have reviewed the patients History and Physical, chart, labs and discussed the procedure including the risks, benefits and alternatives for the proposed anesthesia with the patient or authorized representative who has indicated his/her understanding and acceptance.   Dental advisory given and Dental Advisory Given  Plan Discussed with: CRNA, Anesthesiologist and Surgeon  Anesthesia Plan Comments:       Anesthesia Quick Evaluation

## 2016-09-05 NOTE — Transfer of Care (Signed)
Immediate Anesthesia Transfer of Care Note  Patient: Lowell BoutonKenneth D Ginger  Procedure(s) Performed: Procedure(s): AORTIC VALVE REPLACEMENT (AVR) USING 23 MM MAGNA EASE PERICARDIAL TISSUE VALVE (N/A) TRANSESOPHAGEAL ECHOCARDIOGRAM (TEE) (N/A) CORONARY ARTERY BYPASS GRAFTING (CABG) USING LIMA TO DISTAL LAD AND ENDOSCOPICALLY HARVESTED GREATER SAPHENOUS VEIN TO CIRC. (N/A) ENDOVEIN HARVEST OF GREATER SAPHENOUS VEIN (Right)  Patient Location: SICU  Anesthesia Type:General  Level of Consciousness: Patient remains intubated per anesthesia plan  Airway & Oxygen Therapy: Patient remains intubated per anesthesia plan and Patient placed on Ventilator (see vital sign flow sheet for setting)  Post-op Assessment: Report given to RN  Post vital signs: Reviewed and stable  Last Vitals:  Vitals:   09/05/16 0557 09/05/16 1458  BP: (!) 141/82 (!) 81/46  Pulse: 62 90  Resp: 20 12  Temp: 36.7 C     Last Pain:  Vitals:   09/05/16 0557  TempSrc: Oral      Patients Stated Pain Goal: 4 (09/05/16 0609)  Complications: No apparent anesthesia complications

## 2016-09-05 NOTE — Anesthesia Procedure Notes (Signed)
Central Venous Catheter Insertion Performed by: Roberts Gaudy, anesthesiologist Start/End7/30/2018 6:50 AM, 09/05/2016 6:55 AM Patient location: Pre-op. Preanesthetic checklist: patient identified, IV checked, site marked, risks and benefits discussed, surgical consent, monitors and equipment checked, pre-op evaluation and timeout performed Position: supine Hand hygiene performed , maximum sterile barriers used  and Seldinger technique used Catheter size: 9 Fr MAC introducer Procedure performed using ultrasound guided technique. Ultrasound Notes:anatomy identified, needle tip was noted to be adjacent to the nerve/plexus identified and no ultrasound evidence of intravascular and/or intraneural injection Attempts: 1 Following insertion, line sutured and dressing applied. Post procedure assessment: blood return through all ports, free fluid flow and no air  Patient tolerated the procedure well with no immediate complications.

## 2016-09-05 NOTE — Brief Op Note (Signed)
09/05/2016  12:13 PM  PATIENT:  Lowell BoutonKenneth D Leclaire  66 y.o. male  PRE-OPERATIVE DIAGNOSIS:  SEVERE AS; CAD  POST-OPERATIVE DIAGNOSIS:  SEVERE AS; CAD  PROCEDURE:  Procedure(s):  AORTIC VALVE REPLACEMENT -23 mm [facility] Magn[facility] Ease Pericardial Tissue Valve (Model 3300 [place],  SN: [id number])  CORONARY ARTERY BYPASS GRAFTING x 2 -LIMA to LAD -SVG to OM1  ENDOSCOPIC HARVEST GREATER SAPHENOUS VEIN -Right Thigh  TRANSESOPHAGEAL ECHOCARDIOGRAM (TEE) (N/A)   SURGEON:  Surgeon(s) and Role:    * Delight OvensGerhardt, Edward B, MD - Primary  PHYSICIAN ASSISTANT: Lowella DandyErin Dorella Laster PA-C  ANESTHESIA:   general  EBL:  Total I/O In: 2600 [I.V.:2600] Out: 2200 [Urine:2200]  BLOOD ADMINISTERED: CELLSAVER  DRAINS: Left Pleural Chest Tube, Mediastinal Chest Drains   LOCAL MEDICATIONS USED:  NONE  SPECIMEN:  Source of Specimen:  Aortic Valve Leaflets  DISPOSITION OF SPECIMEN:  PATHOLOGY  COUNTS:  YES  TOURNIQUET:  * No tourniquets in log *  DICTATION: .Dragon Dictation  PLAN OF CARE: Admit to inpatient   PATIENT DISPOSITION:  ICU - intubated and hemodynamically stable.   Delay start of Pharmacological VTE agent (>24hrs) due to surgical blood loss or risk of bleeding: yes

## 2016-09-05 NOTE — Progress Notes (Signed)
At 2220Patients FVC 900, VT 500, NIF -30.  2230 patient extubated HR 90, SpO2 100% on 4L [place], RR 26, BP 129/52.  Patient alert and able to speak.  RT will continue to monitor.

## 2016-09-05 NOTE — Progress Notes (Signed)
      [address]       [place] [id number]             [phone]      Intubated, sedated  BP 130/67   Pulse 89   Temp (!) 96.8 F (36 C)   Resp 17   Wt 190 lb (86.2 kg)   SpO2 100%   BMI 28.06 kg/m  CI= 2.2   Intake/Output Summary (Last 24 hours) at 09/05/16 1827 Last data filed at 09/05/16 1800  Gross per 24 hour  Intake          7792.88 ml  Output             5605 ml  Net          2187.88 ml    Respiratory acidosis improving  K= 4.5, Hct= 33  Doing well early postop  [name] SpareSteven C. Dorris FetchHendrickson, MD [facility] [phone]

## 2016-09-06 ENCOUNTER — Encounter (HOSPITAL_COMMUNITY): Payer: Self-pay | Admitting: Cardiothoracic Surgery

## 2016-09-06 ENCOUNTER — Inpatient Hospital Stay (HOSPITAL_COMMUNITY): Payer: BLUE CROSS/BLUE SHIELD

## 2016-09-06 LAB — CREATININE, SERUM
Creatinine, Ser: 0.83 mg/dL (ref 0.61–1.24)
Creatinine, Ser: 0.72 mg/dL (ref 0.61–1.24)
GFR calc Af Amer: >60 mL/min (ref >60)
GFR calc Af Amer: >60 mL/min (ref >60)
GFR calc non Af Amer: >60 mL/min (ref >60)
GFR calc non Af Amer: >60 mL/min (ref >60)

## 2016-09-06 LAB — BASIC METABOLIC PANEL
Anion gap: 5 (ref 5–15)
BUN: 9 mg/dL (ref 6–20)
CO2: 25 mmol/L (ref 22–32)
Calcium: 7.5 mg/dL — ABNORMAL LOW (ref 8.9–10.3)
Chloride: 108 mmol/L (ref 101–111)
Creatinine, Ser: 0.68 mg/dL (ref 0.61–1.24)
GFR calc Af Amer: >60 mL/min (ref >60)
GFR calc non Af Amer: >60 mL/min (ref >60)
Glucose, Bld: 126 mg/dL — ABNORMAL HIGH (ref 65–99)
Potassium: 4.3 mmol/L (ref 3.5–5.1)
Sodium: 138 mmol/L (ref 135–145)

## 2016-09-06 LAB — CBC
HCT: 29.1 % — ABNORMAL LOW (ref 39.0–52.0)
HCT: 30 % — ABNORMAL LOW (ref 39.0–52.0)
HCT: 29.3 % — ABNORMAL LOW (ref 39.0–52.0)
Hemoglobin: 9.8 g/dL — ABNORMAL LOW (ref 13.0–17.0)
Hemoglobin: 10.1 g/dL — ABNORMAL LOW (ref 13.0–17.0)
Hemoglobin: 9.9 g/dL — ABNORMAL LOW (ref 13.0–17.0)
MCH: 29.9 pg (ref 26.0–34.0)
MCH: 30.1 pg (ref 26.0–34.0)
MCH: 30.2 pg (ref 26.0–34.0)
MCHC: 33.7 g/dL (ref 30.0–36.0)
MCHC: 33.7 g/dL (ref 30.0–36.0)
MCHC: 33.8 g/dL (ref 30.0–36.0)
MCV: 88.7 fL (ref 78.0–100.0)
MCV: 89.3 fL (ref 78.0–100.0)
MCV: 89.3 fL (ref 78.0–100.0)
Platelets: 104 10*3/uL — ABNORMAL LOW (ref 150–400)
Platelets: 102 10*3/uL — ABNORMAL LOW (ref 150–400)
Platelets: 90 10*3/uL — ABNORMAL LOW (ref 150–400)
RBC: 3.28 MIL/uL — ABNORMAL LOW (ref 4.22–5.81)
RBC: 3.36 MIL/uL — ABNORMAL LOW (ref 4.22–5.81)
RBC: 3.28 MIL/uL — ABNORMAL LOW (ref 4.22–5.81)
RDW: 14 % (ref 11.5–15.5)
RDW: 14.2 % (ref 11.5–15.5)
RDW: 14.3 % (ref 11.5–15.5)
WBC: 12.9 10*3/uL — ABNORMAL HIGH (ref 4.0–10.5)
WBC: 17.1 10*3/uL — ABNORMAL HIGH (ref 4.0–10.5)
WBC: 15.1 10*3/uL — ABNORMAL HIGH (ref 4.0–10.5)

## 2016-09-06 LAB — MAGNESIUM
Magnesium: 2.3 mg/dL (ref 1.7–2.4)
Magnesium: 2.4 mg/dL (ref 1.7–2.4)

## 2016-09-06 LAB — POCT I-STAT, CHEM 8
BUN: 16 mg/dL (ref 6–20)
Calcium, Ion: 1.18 mmol/L (ref 1.15–1.40)
Chloride: 100 mmol/L — ABNORMAL LOW (ref 101–111)
Creatinine, Ser: 0.7 mg/dL (ref 0.61–1.24)
Glucose, Bld: 129 mg/dL — ABNORMAL HIGH (ref 65–99)
HCT: 28 % — ABNORMAL LOW (ref 39.0–52.0)
Hemoglobin: 9.5 g/dL — ABNORMAL LOW (ref 13.0–17.0)
Potassium: 4.6 mmol/L (ref 3.5–5.1)
Sodium: 138 mmol/L (ref 135–145)
TCO2: 24 mmol/L (ref 0–100)

## 2016-09-06 LAB — COOXEMETRY PANEL
Carboxyhemoglobin: 1.8 % — ABNORMAL HIGH (ref 0.5–1.5)
Methemoglobin: 1.5 % (ref 0.0–1.5)
O2 Saturation: 57.6 %
Total hemoglobin: 9.1 g/dL — ABNORMAL LOW (ref 12.0–16.0)

## 2016-09-06 LAB — POCT I-STAT 3, ART BLOOD GAS (G3+)
Acid-base deficit: 3 mmol/L — ABNORMAL HIGH (ref 0.0–2.0)
Bicarbonate: 23.3 mmol/L (ref 20.0–28.0)
O2 Saturation: 89 %
Patient temperature: 38.1
TCO2: 25 mmol/L (ref 0–100)
pCO2 arterial: 49.4 mmHg — ABNORMAL HIGH (ref 32.0–48.0)
pH, Arterial: 7.287 — ABNORMAL LOW (ref 7.350–7.450)
pO2, Arterial: 67 mmHg — ABNORMAL LOW (ref 83.0–108.0)

## 2016-09-06 LAB — GLUCOSE, CAPILLARY
Glucose-Capillary: 110 mg/dL — ABNORMAL HIGH (ref 65–99)
Glucose-Capillary: 114 mg/dL — ABNORMAL HIGH (ref 65–99)
Glucose-Capillary: 120 mg/dL — ABNORMAL HIGH (ref 65–99)
Glucose-Capillary: 122 mg/dL — ABNORMAL HIGH (ref 65–99)
Glucose-Capillary: 123 mg/dL — ABNORMAL HIGH (ref 65–99)
Glucose-Capillary: 151 mg/dL — ABNORMAL HIGH (ref 65–99)

## 2016-09-06 MED ORDER — MUPIROCIN 2 % EX OINT
1.0000 "application " | TOPICAL_OINTMENT | Freq: Two times a day (BID) | CUTANEOUS | Status: AC
  Administered 2016-09-06 – 2016-09-10 (×10): 1 via NASAL
  Filled 2016-09-06 (×2): qty 22

## 2016-09-06 MED ORDER — ENOXAPARIN SODIUM 30 MG/0.3ML ~~LOC~~ SOLN
30.0000 mg | Freq: Every day | SUBCUTANEOUS | Status: DC
  Administered 2016-09-06 – 2016-09-07 (×2): 30 mg via SUBCUTANEOUS
  Filled 2016-09-06 (×2): qty 0.3

## 2016-09-06 MED ORDER — CHLORHEXIDINE GLUCONATE CLOTH 2 % EX PADS
6.0000 | MEDICATED_PAD | Freq: Every day | CUTANEOUS | Status: AC
  Administered 2016-09-06 – 2016-09-08 (×3): 6 via TOPICAL

## 2016-09-06 MED ORDER — INSULIN ASPART 100 UNIT/ML ~~LOC~~ SOLN
0.0000 [IU] | SUBCUTANEOUS | Status: DC
  Administered 2016-09-06 – 2016-09-07 (×3): 2 [IU] via SUBCUTANEOUS

## 2016-09-06 NOTE — Progress Notes (Signed)
T[facility] BRIEF SICU PROGRESS NOTE  1 Day Post-Op  S/P Procedure(s) (LRB): AORTIC VALVE REPLACEMENT (AVR) USING 23 MM MAGNA EASE PERICARDIAL TISSUE VALVE (N/A) TRANSESOPHAGEAL ECHOCARDIOGRAM (TEE) (N/A) CORONARY ARTERY BYPASS GRAFTING (CABG) USING LIMA TO DISTAL LAD AND ENDOSCOPICALLY HARVESTED GREATER SAPHENOUS VEIN TO CIRC. (N/A) ENDOVEIN HARVEST OF GREATER SAPHENOUS VEIN (Right)   Stable day NSR w/ stable BP now off Neo drip Breathing comfortably w/ O2 sats 95% on 2 L/min UOP adequate Labs okay  Plan: Continue current plan  Daniel Nailslarence H Annalie Wenner, MD 09/06/2016 5:15 PM

## 2016-09-06 NOTE — Progress Notes (Signed)
Patient ID: [name], male   DOB: May 07, 1950, 66 y.o.   MRN: [id number] TCTS DAILY ICU PROGRESS NOTE                   [address]            [place] [address]          [phone]   1 Day Post-Op Procedure(s) (LRB): AORTIC VALVE REPLACEMENT (AVR) USING 23 MM MAGNA EASE PERICARDIAL TISSUE VALVE (N/A) TRANSESOPHAGEAL ECHOCARDIOGRAM (TEE) (N/A) CORONARY ARTERY BYPASS GRAFTING (CABG) USING LIMA TO DISTAL LAD AND ENDOSCOPICALLY HARVESTED GREATER SAPHENOUS VEIN TO CIRC. (N/A) ENDOVEIN HARVEST OF GREATER SAPHENOUS VEIN (Right)  Total Length of Stay:  LOS: 1 day   Subjective: Patient awake and alert , neuro intact , on low dose neo and ntg  Objective: Vital signs in last 24 hours: Temp:  [96.1 F (35.6 C)-100.9 F (38.3 C)] 100.4 F (38 C) (07/31 0630) Pulse Rate:  [88-117] 95 (07/31 0630) Cardiac Rhythm: Normal sinus rhythm (07/31 0630) Resp:  [0-35] 22 (07/31 0630) BP: (81-130)/(46-67) 97/66 (07/31 0015) SpO2:  [79 %-100 %] 95 % (07/31 0630) Arterial Line BP: (79-162)/(39-84) 105/51 (07/31 0630) FiO2 (%):  [40 %-50 %] 40 % (07/30 2139) Weight:  [206 lb 8 oz (93.7 kg)] 206 lb 8 oz (93.7 kg) (07/31 0600)  Filed Weights   09/05/16 0557 09/06/16 0600  Weight: 190 lb (86.2 kg) 206 lb 8 oz (93.7 kg)    Weight change: 16 lb 8 oz (7.484 kg)   Hemodynamic parameters for last 24 hours: PAP: (38-99)/(4-49) 52/30 CO:  [4.4 L/min-7.2 L/min] 6.8 L/min CI:  [2.2 L/min/m2-3.6 L/min/m2] 3.4 L/min/m2  Intake/Output from previous day: 07/30 0701 - 07/31 0700 In: 9632.6 [P.O.:480; I.V.:5557.6; Blood:1025; IV Piggyback:2570] Out: 5366 [Urine:4455; Blood:1725; Chest Tube:460]  Intake/Output this shift: No intake/output data recorded.  Current Meds: Scheduled Meds: . acetaminophen  1,000 mg Oral Q6H   Or  . acetaminophen (TYLENOL) oral liquid 160 mg/5 mL  1,000 mg Per Tube Q6H  . aspirin EC  325 mg Oral Daily   Or  . aspirin  324 mg Per Tube Daily  .  atorvastatin  40 mg Oral Daily  . bisacodyl  10 mg Oral Daily   Or  . bisacodyl  10 mg Rectal Daily  . chlorhexidine gluconate (MEDLINE KIT)  15 mL Mouth Rinse BID  . Chlorhexidine Gluconate Cloth  6 each Topical Daily  . Chlorhexidine Gluconate Cloth  6 each Topical Daily  . docusate sodium  200 mg Oral Daily  . insulin aspart  0-24 Units Subcutaneous Q4H  . levalbuterol  0.63 mg Nebulization Q6H  . loratadine  10 mg Oral Daily  . mouth rinse  15 mL Mouth Rinse QID  . metoCLOPramide (REGLAN) injection  10 mg Intravenous Q6H  . metoprolol tartrate  12.5 mg Oral BID   Or  . metoprolol tartrate  12.5 mg Per Tube BID  . mometasone-formoterol  2 puff Inhalation BID  . mupirocin ointment  1 application Nasal BID  . [START ON 09/07/2016] pantoprazole  40 mg Oral Daily  . sodium chloride flush  10-40 mL Intracatheter Q12H  . sodium chloride flush  3 mL Intravenous Q12H  . tiotropium  18 mcg Inhalation Daily   Continuous Infusions: . sodium chloride 10 mL/hr at 09/06/16 0600  . sodium chloride    . sodium chloride 20 mL/hr at 09/06/16 0600  . cefUROXime (ZINACEF)  IV Stopped (09/06/16 0130)  .  dexmedetomidine (PRECEDEX) IV infusion Stopped (09/05/16 2000)  . lactated ringers 10 mL/hr at 09/06/16 0600  . lactated ringers 10 mL/hr at 09/06/16 0600  . nitroGLYCERIN 10 mcg/min (09/06/16 0600)  . phenylephrine (NEO-SYNEPHRINE) Adult infusion 20 mcg/min (09/06/16 0645)   PRN Meds:.sodium chloride, albuterol, fluticasone, metoprolol tartrate, midazolam, morphine injection, ondansetron (ZOFRAN) IV, oxyCODONE, sodium chloride flush, sodium chloride flush, traMADol  General appearance: alert and cooperative Neurologic: intact Heart: regular rate and rhythm, S1, S2 normal, no murmur, click, rub or gallop Lungs: diminished breath sounds bibasilar Abdomen: soft, non-tender; bowel sounds normal; no masses,  no organomegaly Extremities: extremities normal, atraumatic, no cyanosis or edema and Homans  sign is negative, no sign of DVT Wound: dressing intact  Lab Results: CBC: Recent Labs  09/05/16 2141 09/05/16 2152 09/06/16 0352  WBC 12.2*  --  12.9*  HGB 10.0* 9.9* 9.8*  HCT 29.4* 29.0* 29.1*  PLT 100*  --  104*   BMET:  Recent Labs  09/05/16 2152 09/06/16 0352  NA 140 138  K 4.5 4.3  CL 103 108  CO2  --  25  GLUCOSE 137* 126*  BUN 6 9  CREATININE 0.40* 0.68  CALCIUM  --  7.5*    CMET: Lab Results  Component Value Date   WBC 12.9 (H) 09/06/2016   HGB 9.8 (L) 09/06/2016   HCT 29.1 (L) 09/06/2016   PLT 104 (L) 09/06/2016   GLUCOSE 126 (H) 09/06/2016   CHOL 136 05/16/2016   TRIG 225 (H) 05/16/2016   HDL 35 (L) 05/16/2016   LDLCALC 56 05/16/2016   ALT 18 09/01/2016   AST 29 09/01/2016   NA 138 09/06/2016   K 4.3 09/06/2016   CL 108 09/06/2016   CREATININE 0.68 09/06/2016   BUN 9 09/06/2016   CO2 25 09/06/2016   PSA 1.4 11/19/2015   INR 1.64 09/05/2016   HGBA1C 5.8 (H) 09/01/2016      PT/INR:  Recent Labs  09/05/16 1500  LABPROT 19.6*  INR 1.64   Radiology: Dg Chest Port 1 View  Result Date: 09/05/2016 CLINICAL DATA:  CABG.  Aortic valve replacement . EXAM: PORTABLE CHEST 1 VIEW COMPARISON:  09/01/2016.  CT 08/12/2016. FINDINGS: Endotracheal tube noted with its tip 2 cm above the carina. NG tube noted with tip below left hemidiaphragm. Side-hole appears to be at gastroesophageal junction. Advancement of approximately 8 cm should be considered. Swan-Ganz catheter with its tip projected over the main pulmonary. Left chest tube noted over the left chest. Miniscule pneumothorax on the left cannot be completely excluded. Prior CABG and cardiac valve replacement. No mild left base atelectasis. Mild infiltrate cannot be excluded. No pleural effusion. IMPRESSION: 1. NG tube noted with its tip below the left hemidiaphragm. Side-hole appears to be at the gastroesophageal junction. Advancement of the NG tube approximately 8 cm should be considered . Remaining  lines and tubes including left chest tube position as above. Miniscule left pneumothorax cannot be excluded. 2. Prior CABG and cardiac valve replacement. Left base atelectasis. Left base infiltrate cannot be excluded. Electronically Signed   By: Marcello Moores  Register   On: 09/05/2016 15:22     Assessment/Plan: S/P Procedure(s) (LRB): AORTIC VALVE REPLACEMENT (AVR) USING 23 MM MAGNA EASE PERICARDIAL TISSUE VALVE (N/A) TRANSESOPHAGEAL ECHOCARDIOGRAM (TEE) (N/A) CORONARY ARTERY BYPASS GRAFTING (CABG) USING LIMA TO DISTAL LAD AND ENDOSCOPICALLY HARVESTED GREATER SAPHENOUS VEIN TO CIRC. (N/A) ENDOVEIN HARVEST OF GREATER SAPHENOUS VEIN (Right) Mobilize Diuresis d/c tubes/lines See progression orders Expected Acute  Blood - loss  Anemia Check cox, d/c sg and pleural tube today    Grace Isaac 09/06/2016 7:11 AM

## 2016-09-06 NOTE — Op Note (Signed)
NAME:  [name], [name]           ACCOUNT NO.:  [id number]  MEDICAL RECORD NO.:  [id number]  LOCATION:  [facility]                         FACILITY:  [facility]  PHYSICIAN:  Sheliah PlaneEdward Vasilia Dise, MD    DATE OF BIRTH:  05-Aug-1950  DATE OF PROCEDURE:  09/05/2016 DATE OF DISCHARGE:                              OPERATIVE REPORT   PREOPERATIVE DIAGNOSIS:  Critical aortic stenosis and coronary occlusive disease.  POSTOPERATIVE DIAGNOSIS:  Critical aortic stenosis and coronary occlusive disease.  SURGICAL PROCEDURE:  Coronary artery bypass grafting x2, with left internal mammary to the distal left anterior descending coronary artery and reverse saphenous vein graft to the circumflex coronary artery and aortic valve replacement with a pericardial tissue valve, [employer] 23-mm, model 3300TFX, serial number [id number]. Right Thigh endovein harvesting of right Greater Saphenous Vein  SURGEON:  Sheliah PlaneEdward Daniel Casagrande, MD.  FIRST ASSISTANT:  Erin Barrett.  BRIEF HISTORY:  The patient is a 66 year old male who presented in late December with acute myocardial infarction.  At that time, he was treated medically, but evaluation also confirmed aortic stenosis.  Over the past several months, he was having increasing symptoms of critical aortic stenosis.  Followup echocardiogram revealed a mean gradient of just over 40 mm and a peak velocity over 4 cm a second.  With the patient's increasing symptoms of shortness of breath and angina, he was referred to Cardiac Surgery.  The patient's previous calf films done in December were reviewed and felt to be inadequate to proceed with surgery and the patient underwent repeat cardiac catheterization that showed at least 70% lesion in the LAD at midportion and worsening high-grade complex plaque in the proximal circumflex, which was a large dominant vessel. The patient had some minor disease in the right coronary artery, but no critical stenosis.  Overall, ventricular  function was preserved. Coronary artery bypass grafting in aortic valve was recommended to the patient, who agreed and signed informed consent.  Careful discussion with the patient about the pros and cons of tissue versus mechanical valves have been carried on several occasions and the patient preferred a tissue valve.  DESCRIPTION OF PROCEDURE:  With Swan-Ganz and arterial line monitors in place, the patient underwent general endotracheal anesthesia without incident.  Skin of the chest and legs was prepped with Betadine and draped in usual sterile manner.  A TEE probe was placed by Dr. Noreene LarssonJoslin confirming critical aortic stenosis.  It was difficult to determine if this was a bicuspid or tricuspid valve, appeared definitely functionally to be a bicuspid valve.  The ascending aorta was not dilated.  Overall, ventricular function was preserved with significant left ventricular hypertrophy.  Appropriate time-out was performed and we proceeded with endovein harvesting of the right greater saphenous vein from a small incision just at the knee.  The vein was of excellent quality and caliber.  Median sternotomy was performed.  Left internal mammary artery was dissected down as a pedicle graft.  The distal artery was divided, had good free flow.  The pericardium was opened.  Overall, ventricular function appeared preserved with evidence of left ventricular hypertrophy.  The patient was systemically heparinized.  Ascending aorta was cannulated.  The right atrium was cannulated.  A retrograde cardioplegia  catheter was placed.  The patient was placed on cardiopulmonary bypass 2.4 L/min/m2.  A right superior pulmonary vein vent was placed.  The patient's body temperature was cooled to 32 degrees.  Sites of anastomosis of the distal LAD and circumflex were evaluated and dissected out.  The patient's body temperature as noted was cooled to 32 degrees.  Aortic crossclamp was applied and 800 mL of cold  blood potassium cardioplegia was administered antegrade. Additional cold blood cardioplegia was administered retrograde.  We proceeded first with coronary artery bypass grafting.  The heart was elevated.  The circumflex coronary artery was opened.  It was a good- quality vessel and good size, at least 2 mm in size using a running 7-0 Prolene, distal anastomosis with a segment of reverse saphenous vein graft was carried out.  We then turned our attention to the LAD and the distal LAD was opened, admitted a 1-mm probe distally.  Using running 8- 0 Prolene, left internal mammary artery was anastomosed to the left anterior descending coronary artery.  The fascia was tacked to the epicardium.  We then proceeded to expose the aortic root.  A transverse aortotomy was performed above the takeoff of the right coronary cusp, this gave good exposure to a trileaflet with aortic valve, but highly calcified and functionally bicuspid, though there were three very distinct raphe present and of the equal size.  The valve was highly calcified.  There was some sparing of the leaflets at the hinge points. The valve was excised.  The annulus decalcified.  Care was taken to remove all loose calcific debris.  The annulus was sized for 23 pericardial tissue valve.  Intermittently, cold retrograde cardioplegia was administered.  #2 Tycron pledgeted sutures with pledgets on the ventricular surface were placed circumferentially around the annulus, a total of 14.  The valve was then secured in place and seated well without impingement on the right or left coronary ostia.  The aortotomy was then closed with horizontal mattress suture over felt strips.  The site of the cardioplegia needle was removed and a punch aortotomy was performed.  This was the site of proximal anastomosis of the vein graft prior to complete closure.  The bulldog was removed from the mammary artery.  A hot shot of warm cardioplegia was  administered.  The heart was allowed to passively fill and de-air.  Anastomosis was completed and the aortic crossclamp was removed with a total crossclamp time of 118 minutes.  The patient required electric defibrillation and turned to a sinus rhythm.  Sites of anastomosis were inspected, were free of bleeding.  Atrial and ventricular pacing wires were applied with the patient's body temperature rewarmed to 37 degrees.  He was then filled and injecting.  TEE showed good function of the valve.  We then removed the right superior pulmonary vein vent and retrograde cardioplegia catheter.  With the patient's body temperature rewarmed to 37 degrees, he was then ventilated and weaned from cardiopulmonary bypass without difficulty.  He remained hemodynamically stable.  He was decannulated in usual fashion.  Protamine sulfate was administered.  The patient did have a mild coagulopathy, which gradually resolved, and with the operative field hemostatic, graft markers were applied.  A left pleural tube and a Blake mediastinal drain were left in place.  Pericardium was loosely reapproximated.  Sternum was closed with #6 stainless steel wire, fascia closed with interrupted 0 Vicryl, running 3-0 Vicryl in the subcutaneous tissue, 4-0 subcuticular stitch in skin edges.  Dry dressings were  applied.  Sponge and needle counts were reported as correct at the completion of procedure.  Total pump time was 156 minutes.  The patient did not require any blood bank, blood products during the operative procedure.     Sheliah Plane, MD     EG/MEDQ  D:  09/05/2016  T:  09/06/2016  Job:  [id number]

## 2016-09-07 ENCOUNTER — Inpatient Hospital Stay (HOSPITAL_COMMUNITY): Payer: BLUE CROSS/BLUE SHIELD

## 2016-09-07 LAB — COOXEMETRY PANEL
Carboxyhemoglobin: 1.9 % — ABNORMAL HIGH (ref 0.5–1.5)
Methemoglobin: 1.2 % (ref 0.0–1.5)
O2 Saturation: 72.3 %
Total hemoglobin: 12 g/dL (ref 12.0–16.0)

## 2016-09-07 LAB — BASIC METABOLIC PANEL
Anion gap: 6 (ref 5–15)
BUN: 16 mg/dL (ref 6–20)
CO2: 27 mmol/L (ref 22–32)
Calcium: 7.9 mg/dL — ABNORMAL LOW (ref 8.9–10.3)
Chloride: 101 mmol/L (ref 101–111)
Creatinine, Ser: 0.65 mg/dL (ref 0.61–1.24)
GFR calc Af Amer: >60 mL/min (ref >60)
GFR calc non Af Amer: >60 mL/min (ref >60)
Glucose, Bld: 135 mg/dL — ABNORMAL HIGH (ref 65–99)
Potassium: 4.5 mmol/L (ref 3.5–5.1)
Sodium: 134 mmol/L — ABNORMAL LOW (ref 135–145)

## 2016-09-07 LAB — GLUCOSE, CAPILLARY
Glucose-Capillary: 130 mg/dL — ABNORMAL HIGH (ref 65–99)
Glucose-Capillary: 135 mg/dL — ABNORMAL HIGH (ref 65–99)

## 2016-09-07 LAB — CBC
HCT: 31 % — ABNORMAL LOW (ref 39.0–52.0)
Hemoglobin: 10.3 g/dL — ABNORMAL LOW (ref 13.0–17.0)
MCH: 30.1 pg (ref 26.0–34.0)
MCHC: 33.2 g/dL (ref 30.0–36.0)
MCV: 90.6 fL (ref 78.0–100.0)
Platelets: 74 10*3/uL — ABNORMAL LOW (ref 150–400)
RBC: 3.42 MIL/uL — ABNORMAL LOW (ref 4.22–5.81)
RDW: 14.5 % (ref 11.5–15.5)
WBC: 13.5 10*3/uL — ABNORMAL HIGH (ref 4.0–10.5)

## 2016-09-07 MED ORDER — POTASSIUM CHLORIDE CRYS ER 20 MEQ PO TBCR
20.0000 meq | EXTENDED_RELEASE_TABLET | Freq: Every day | ORAL | Status: DC
  Administered 2016-09-07 – 2016-09-09 (×3): 20 meq via ORAL
  Filled 2016-09-07 (×3): qty 1

## 2016-09-07 MED ORDER — FUROSEMIDE 40 MG PO TABS
40.0000 mg | ORAL_TABLET | Freq: Every day | ORAL | Status: DC
  Administered 2016-09-07: 40 mg via ORAL
  Filled 2016-09-07: qty 1

## 2016-09-07 NOTE — Progress Notes (Addendum)
[facility] DAILY ICU PROGRESS NOTE                   [address]            [place] [address]          [phone]   2 Days Post-Op Procedure(s) (LRB): AORTIC VALVE REPLACEMENT (AVR) USING 23 MM MAGNA EASE PERICARDIAL TISSUE VALVE (N/A) TRANSESOPHAGEAL ECHOCARDIOGRAM (TEE) (N/A) CORONARY ARTERY BYPASS GRAFTING (CABG) USING LIMA TO DISTAL LAD AND ENDOSCOPICALLY HARVESTED GREATER SAPHENOUS VEIN TO CIRC. (N/A) ENDOVEIN HARVEST OF GREATER SAPHENOUS VEIN (Right)  Total Length of Stay:  LOS: 2 days   Subjective:  Patient is doing okay.  He does have some pain and mild shortness of breath.  No BM, + flatus  Objective: Vital signs in last 24 hours: Temp:  [97.5 F (36.4 C)-100 F (37.8 C)] 98.1 F (36.7 C) (08/01 0345) Pulse Rate:  [58-90] 90 (08/01 0700) Cardiac Rhythm: Atrial paced (08/01 0400) Resp:  [10-26] 16 (08/01 0700) BP: (93-148)/(54-88) 93/76 (08/01 0700) SpO2:  [88 %-100 %] 95 % (08/01 0700) Arterial Line BP: (89-148)/(44-57) 148/55 (07/31 1700) Weight:  [206 lb 4.8 oz (93.6 kg)] 206 lb 4.8 oz (93.6 kg) (08/01 0500)  Filed Weights   09/05/16 0557 09/06/16 0600 09/07/16 0500  Weight: 190 lb (86.2 kg) 206 lb 8 oz (93.7 kg) 206 lb 4.8 oz (93.6 kg)    Weight change: -3.2 oz (-0.091 kg)   Intake/Output from previous day: 07/31 0701 - 08/01 0700 In: 1449.2 [P.O.:720; I.V.:629.2; IV Piggyback:100] Out: 1195 [Urine:765; Chest Tube:430]  Intake/Output this shift: No intake/output data recorded.  Current Meds: Scheduled Meds: . acetaminophen  1,000 mg Oral Q6H   Or  . acetaminophen (TYLENOL) oral liquid 160 mg/5 mL  1,000 mg Per Tube Q6H  . aspirin EC  325 mg Oral Daily   Or  . aspirin  324 mg Per Tube Daily  . atorvastatin  40 mg Oral Daily  . bisacodyl  10 mg Oral Daily   Or  . bisacodyl  10 mg Rectal Daily  . chlorhexidine gluconate (MEDLINE KIT)  15 mL Mouth Rinse BID  . Chlorhexidine Gluconate Cloth  6 each Topical Daily  . Chlorhexidine  Gluconate Cloth  6 each Topical Daily  . docusate sodium  200 mg Oral Daily  . enoxaparin (LOVENOX) injection  30 mg Subcutaneous QHS  . furosemide  40 mg Oral Daily  . insulin aspart  0-24 Units Subcutaneous Q4H  . levalbuterol  0.63 mg Nebulization Q6H  . loratadine  10 mg Oral Daily  . mouth rinse  15 mL Mouth Rinse QID  . metoprolol tartrate  12.5 mg Oral BID   Or  . metoprolol tartrate  12.5 mg Per Tube BID  . mometasone-formoterol  2 puff Inhalation BID  . mupirocin ointment  1 application Nasal BID  . pantoprazole  40 mg Oral Daily  . potassium chloride  20 mEq Oral Daily  . sodium chloride flush  10-40 mL Intracatheter Q12H  . sodium chloride flush  3 mL Intravenous Q12H  . tiotropium  18 mcg Inhalation Daily   Continuous Infusions: . sodium chloride 10 mL/hr at 09/06/16 0600  . sodium chloride    . sodium chloride 20 mL/hr at 09/06/16 0600  . cefUROXime (ZINACEF)  IV Stopped (09/07/16 0056)  . dexmedetomidine (PRECEDEX) IV infusion Stopped (09/05/16 2000)  . lactated ringers 10 mL/hr at 09/06/16 0600  . lactated ringers 10 mL/hr at 09/07/16  0622   PRN Meds:.sodium chloride, albuterol, fluticasone, metoprolol tartrate, midazolam, morphine injection, ondansetron (ZOFRAN) IV, oxyCODONE, sodium chloride flush, sodium chloride flush, traMADol  General appearance: alert, cooperative and no distress Heart: regular rate and rhythm Lungs: diminished breath sounds bibasilar Abdomen: soft, non-tender; bowel sounds normal; no masses,  no organomegaly Extremities: edema 1+ mild pitting Wound: clean and dry  Lab Results: CBC: Recent Labs  09/06/16 1626 09/06/16 1639 09/07/16 0316  WBC 15.1*  --  13.5*  HGB 9.9* 9.5* 10.3*  HCT 29.3* 28.0* 31.0*  PLT 90*  --  74*   BMET:  Recent Labs  09/06/16 0352  09/06/16 1639 09/07/16 0316  NA 138  --  138 134*  K 4.3  --  4.6 4.5  CL 108  --  100* 101  CO2 25  --   --  27  GLUCOSE 126*  --  129* 135*  BUN 9  --  16 16    CREATININE 0.68  < > 0.70 0.65  CALCIUM 7.5*  --   --  7.9*  < > = values in this interval not displayed.  CMET: Lab Results  Component Value Date   WBC 13.5 (H) 09/07/2016   HGB 10.3 (L) 09/07/2016   HCT 31.0 (L) 09/07/2016   PLT 74 (L) 09/07/2016   GLUCOSE 135 (H) 09/07/2016   CHOL 136 05/16/2016   TRIG 225 (H) 05/16/2016   HDL 35 (L) 05/16/2016   LDLCALC 56 05/16/2016   ALT 18 09/01/2016   AST 29 09/01/2016   NA 134 (L) 09/07/2016   K 4.5 09/07/2016   CL 101 09/07/2016   CREATININE 0.65 09/07/2016   BUN 16 09/07/2016   CO2 27 09/07/2016   PSA 1.4 11/19/2015   INR 1.64 09/05/2016   HGBA1C 5.8 (H) 09/01/2016      PT/INR:  Recent Labs  09/05/16 1500  LABPROT 19.6*  INR 1.64   Radiology: Dg Chest Port 1 View  Result Date: 09/07/2016 CLINICAL DATA:  Status post aortic valve replacement and CABG 2 days ago EXAM: PORTABLE CHEST 1 VIEW COMPARISON:  Portable chest x-ray of September 06, 2016 FINDINGS: The lungs are adequately inflated. There is persistent increased density at the left lung base. The interstitial markings of the left lung remain increased. A tiny pleural line is noted in the left pulmonary apex. The left chest tube tip projects just inferior to the posterolateral aspect of the third rib. The heart is normal in size. The pulmonary vascularity is normal. The Swan-Ganz catheter is been removed. The right internal jugular Cordis sheath projects over the proximal SVC. IMPRESSION: Further interval decrease in interstitial edema and left lower lobe atelectasis. A less than 5% apical pneumothorax on the left persists. The left chest tube is in stable position. Electronically Signed   By: David  Martinique M.D.   On: 09/07/2016 07:08     Assessment/Plan: S/P Procedure(s) (LRB): AORTIC VALVE REPLACEMENT (AVR) USING 23 MM MAGNA EASE PERICARDIAL TISSUE VALVE (N/A) TRANSESOPHAGEAL ECHOCARDIOGRAM (TEE) (N/A) CORONARY ARTERY BYPASS GRAFTING (CABG) USING LIMA TO DISTAL LAD AND  ENDOSCOPICALLY HARVESTED GREATER SAPHENOUS VEIN TO CIRC. (N/A) ENDOVEIN HARVEST OF GREATER SAPHENOUS VEIN (Right)  1. CV- NSR, off all drips, mild Hypotension with SBP in the 90s- may benefit from cessation of BB until BP improves, Co-ox is 72% 2. Pulm- wean oxygen as tolerated, chest tube without air leak, 430 cc output yesterday- CXR with stable appearance of apical pneumothorax, improved pulmonary edema- will defer chest tube removal to  Dr. Servando Snare 3. Renal- creatinine WNL at 0.65, + hypervolemia, weight is elevated 16 lbs since surgery- he would benefit from IV lasix, however with hypotension I do not feel he will tolerate IV diuresis, will give oral dose of 40 mg today, supplement K 4. Expected post operative blood loss anemia, mild Hgb at 10.3 5. CBGs- well controlled, patient is not a diabetic, will d/c cbgs, SSIP 6. Dispo- patient stable, Hypotensive will d/c BB for now, try oral lasix if BP allows as patient is hypervolemia, stop CBGs as patient is not diabetic, chest tube per Dr. Servando Snare, not quite ready for transfer to stepdown unit   [name], [name] 09/07/2016 7:50 AM  Renal function  Stable D/c chest tube  Replacing kcl Mild diuresis as tolerated  Daniel Isaac MD      [address] [place] [address] Office [phone]   [place]

## 2016-09-07 NOTE — Progress Notes (Signed)
Anesthesiology Follow-up:  Awake and alert, neuro intact, in good spirits, sitting in chair. Taking PO liquids.  VS: T- 36.7 BP- 121/65 HR 88 (SR) RR- 19 O2 Sat 99% on 3L  K- 4.5 BUN/Cr. 16/0.65 glucose- 120 BUN/Cr.- 16/0.65 H/H- 10.3/31.0 Platelets- 74,000. CoX 58% yesterday morning.  Extubated- 71/2 hours post-op.  66 year old male 2 days S/P AVR/CABG X 2. Stable post-op course so far, no apparent complications.  [name]

## 2016-09-07 NOTE — Progress Notes (Signed)
Patient ID: Daniel BoutonKenneth D [name], male   DOB: 1950/06/29, 66 y.o.   MRN: [id number] EVENING ROUNDS NOTE :     [address]       [place] [id number]             [phone]                 2 Days Post-Op Procedure(s) (LRB): AORTIC VALVE REPLACEMENT (AVR) USING 23 MM MAGNA EASE PERICARDIAL TISSUE VALVE (N/A) TRANSESOPHAGEAL ECHOCARDIOGRAM (TEE) (N/A) CORONARY ARTERY BYPASS GRAFTING (CABG) USING LIMA TO DISTAL LAD AND ENDOSCOPICALLY HARVESTED GREATER SAPHENOUS VEIN TO CIRC. (N/A) ENDOVEIN HARVEST OF GREATER SAPHENOUS VEIN (Right)  Total Length of Stay:  LOS: 2 days  BP (!) 90/51   Pulse 92   Temp (!) 97.5 F (36.4 C) (Oral)   Resp 18   Ht 5\' 9"  (1.753 m)   Wt 206 lb 4.8 oz (93.6 kg)   SpO2 94%   BMI 30.47 kg/m   .Intake/Output      07/31 0701 - 08/01 0700 08/01 0701 - 08/02 0700   P.O. 720 240   I.V. (mL/kg) 629.2 (6.7) 133 (1.4)   IV Piggyback 100 50   Total Intake(mL/kg) 1449.2 (15.5) 423 (4.5)   Urine (mL/kg/hr) 765 (0.3)    Chest Tube 430 200   Total Output 1195 200   Net +254.2 +223          . sodium chloride 10 mL/hr at 09/06/16 0600  . sodium chloride    . sodium chloride 20 mL/hr at 09/06/16 0600  . lactated ringers 10 mL/hr at 09/06/16 0600  . lactated ringers 10 mL/hr at 09/07/16 0622     Lab Results  Component Value Date   WBC 13.5 (H) 09/07/2016   HGB 10.3 (L) 09/07/2016   HCT 31.0 (L) 09/07/2016   PLT 74 (L) 09/07/2016   GLUCOSE 135 (H) 09/07/2016   CHOL 136 05/16/2016   TRIG 225 (H) 05/16/2016   HDL 35 (L) 05/16/2016   LDLCALC 56 05/16/2016   ALT 18 09/01/2016   AST 29 09/01/2016   NA 134 (L) 09/07/2016   K 4.5 09/07/2016   CL 101 09/07/2016   CREATININE 0.65 09/07/2016   BUN 16 09/07/2016   CO2 27 09/07/2016   PSA 1.4 11/19/2015   INR 1.64 09/05/2016   HGBA1C 5.8 (H) 09/01/2016   Tubes out, foley out and voiding  Ambulating Holding sinus   Delight OvensEdward B Mukhtar Shams MD Beeper [phone] Office [phone] 09/07/2016 6:11  PM    Delight OvensEdward B Brynda Heick MD  Beeper [phone] Office [phone] 09/07/2016 6:11 PM

## 2016-09-08 ENCOUNTER — Inpatient Hospital Stay (HOSPITAL_COMMUNITY): Payer: BLUE CROSS/BLUE SHIELD

## 2016-09-08 LAB — COMPREHENSIVE METABOLIC PANEL
ALT: 13 U/L — ABNORMAL LOW (ref 17–63)
AST: 24 U/L (ref 15–41)
Albumin: 3 g/dL — ABNORMAL LOW (ref 3.5–5.0)
Alkaline Phosphatase: 34 U/L — ABNORMAL LOW (ref 38–126)
Anion gap: 6 (ref 5–15)
BUN: 20 mg/dL (ref 6–20)
CO2: 28 mmol/L (ref 22–32)
Calcium: 8.3 mg/dL — ABNORMAL LOW (ref 8.9–10.3)
Chloride: 102 mmol/L (ref 101–111)
Creatinine, Ser: 0.68 mg/dL (ref 0.61–1.24)
GFR calc Af Amer: >60 mL/min (ref >60)
GFR calc non Af Amer: >60 mL/min (ref >60)
Glucose, Bld: 125 mg/dL — ABNORMAL HIGH (ref 65–99)
Potassium: 4.8 mmol/L (ref 3.5–5.1)
Sodium: 136 mmol/L (ref 135–145)
Total Bilirubin: 0.9 mg/dL (ref 0.3–1.2)
Total Protein: 5.1 g/dL — ABNORMAL LOW (ref 6.5–8.1)

## 2016-09-08 LAB — BASIC METABOLIC PANEL
Anion gap: 7 (ref 5–15)
BUN: 17 mg/dL (ref 6–20)
CO2: 30 mmol/L (ref 22–32)
Calcium: 8.3 mg/dL — ABNORMAL LOW (ref 8.9–10.3)
Chloride: 100 mmol/L — ABNORMAL LOW (ref 101–111)
Creatinine, Ser: 0.57 mg/dL — ABNORMAL LOW (ref 0.61–1.24)
GFR calc Af Amer: >60 mL/min (ref >60)
GFR calc non Af Amer: >60 mL/min (ref >60)
Glucose, Bld: 128 mg/dL — ABNORMAL HIGH (ref 65–99)
Potassium: 4.1 mmol/L (ref 3.5–5.1)
Sodium: 137 mmol/L (ref 135–145)

## 2016-09-08 LAB — COOXEMETRY PANEL
Carboxyhemoglobin: 2.1 % — ABNORMAL HIGH (ref 0.5–1.5)
Methemoglobin: 0.7 % (ref 0.0–1.5)
O2 Saturation: 59.8 %
Total hemoglobin: 8.8 g/dL — ABNORMAL LOW (ref 12.0–16.0)

## 2016-09-08 LAB — CBC WITH DIFFERENTIAL/PLATELET
Basophils Absolute: 0 10*3/uL (ref 0.0–0.1)
Basophils Relative: 0 %
Eosinophils Absolute: 0.1 10*3/uL (ref 0.0–0.7)
Eosinophils Relative: 1 %
HCT: 24.8 % — ABNORMAL LOW (ref 39.0–52.0)
Hemoglobin: 8.3 g/dL — ABNORMAL LOW (ref 13.0–17.0)
Lymphocytes Relative: 10 %
Lymphs Abs: 1.1 10*3/uL (ref 0.7–4.0)
MCH: 30.6 pg (ref 26.0–34.0)
MCHC: 33.5 g/dL (ref 30.0–36.0)
MCV: 91.5 fL (ref 78.0–100.0)
Monocytes Absolute: 1.4 10*3/uL — ABNORMAL HIGH (ref 0.1–1.0)
Monocytes Relative: 12 %
Neutro Abs: 8.6 10*3/uL — ABNORMAL HIGH (ref 1.7–7.7)
Neutrophils Relative %: 77 %
Platelets: 95 10*3/uL — ABNORMAL LOW (ref 150–400)
RBC: 2.71 MIL/uL — ABNORMAL LOW (ref 4.22–5.81)
RDW: 14.4 % (ref 11.5–15.5)
WBC: 11.1 10*3/uL — ABNORMAL HIGH (ref 4.0–10.5)

## 2016-09-08 LAB — CBC
HCT: 26 % — ABNORMAL LOW (ref 39.0–52.0)
Hemoglobin: 8.6 g/dL — ABNORMAL LOW (ref 13.0–17.0)
MCH: 30.5 pg (ref 26.0–34.0)
MCHC: 33.1 g/dL (ref 30.0–36.0)
MCV: 92.2 fL (ref 78.0–100.0)
Platelets: 86 10*3/uL — ABNORMAL LOW (ref 150–400)
RBC: 2.82 MIL/uL — ABNORMAL LOW (ref 4.22–5.81)
RDW: 14.4 % (ref 11.5–15.5)
WBC: 13.1 10*3/uL — ABNORMAL HIGH (ref 4.0–10.5)

## 2016-09-08 MED ORDER — TRAMADOL HCL 50 MG PO TABS
50.0000 mg | ORAL_TABLET | ORAL | Status: DC | PRN
  Administered 2016-09-08 – 2016-09-20 (×9): 50 mg via ORAL
  Filled 2016-09-08 (×9): qty 1

## 2016-09-08 MED ORDER — FUROSEMIDE 10 MG/ML IJ SOLN
40.0000 mg | Freq: Once | INTRAMUSCULAR | Status: AC
  Administered 2016-09-08: 40 mg via INTRAVENOUS
  Filled 2016-09-08: qty 4

## 2016-09-08 MED ORDER — OXYCODONE HCL 5 MG PO TABS
5.0000 mg | ORAL_TABLET | ORAL | Status: DC | PRN
  Administered 2016-09-10 – 2016-09-11 (×5): 5 mg via ORAL
  Filled 2016-09-08 (×5): qty 1

## 2016-09-08 MED ORDER — LEVALBUTEROL HCL 0.63 MG/3ML IN NEBU
0.6300 mg | INHALATION_SOLUTION | Freq: Four times a day (QID) | RESPIRATORY_TRACT | Status: DC
  Administered 2016-09-08 – 2016-09-12 (×15): 0.63 mg via RESPIRATORY_TRACT
  Filled 2016-09-08 (×17): qty 3

## 2016-09-08 MED ORDER — LEVALBUTEROL HCL 0.63 MG/3ML IN NEBU
0.6300 mg | INHALATION_SOLUTION | RESPIRATORY_TRACT | Status: DC | PRN
  Administered 2016-09-12 – 2016-09-19 (×4): 0.63 mg via RESPIRATORY_TRACT
  Filled 2016-09-08 (×5): qty 3

## 2016-09-08 MED ORDER — SODIUM CHLORIDE 0.9% FLUSH
10.0000 mL | Freq: Two times a day (BID) | INTRAVENOUS | Status: DC
  Administered 2016-09-08 – 2016-09-16 (×7): 10 mL

## 2016-09-08 MED ORDER — SODIUM CHLORIDE 0.9% FLUSH
10.0000 mL | INTRAVENOUS | Status: DC | PRN
  Administered 2016-09-09 – 2016-09-12 (×3): 10 mL
  Administered 2016-09-13 (×2): 20 mL
  Administered 2016-09-14 – 2016-09-18 (×3): 10 mL
  Filled 2016-09-08 (×8): qty 40

## 2016-09-08 MED ORDER — ORAL CARE MOUTH RINSE
15.0000 mL | Freq: Two times a day (BID) | OROMUCOSAL | Status: DC
  Administered 2016-09-09 – 2016-09-18 (×15): 15 mL via OROMUCOSAL

## 2016-09-08 MED ORDER — LEVALBUTEROL HCL 0.63 MG/3ML IN NEBU
0.6300 mg | INHALATION_SOLUTION | Freq: Three times a day (TID) | RESPIRATORY_TRACT | Status: DC
  Administered 2016-09-08 (×2): 0.63 mg via RESPIRATORY_TRACT
  Filled 2016-09-08 (×2): qty 3

## 2016-09-08 MED ORDER — CHLORHEXIDINE GLUCONATE CLOTH 2 % EX PADS
6.0000 | MEDICATED_PAD | Freq: Every day | CUTANEOUS | Status: DC
  Administered 2016-09-09 – 2016-09-20 (×7): 6 via TOPICAL

## 2016-09-08 NOTE — Care Management Note (Signed)
Case Management Note  Patient Details  Name: [name] MRN: [id number] Date of Birth: Mar 03, 1950  Subjective/Objective:   Pt is [name]/p CABG                 Action/Plan:  PTA independent from home with wife.  Wife will provide 24 supervision at discharge.  Pt has PCP and denied barriers to obtaining medications as prescribed.  CM will continue to follow for discharge needs   Expected Discharge Date:                  Expected Discharge Plan:  Home/Self Care  In-House Referral:     Discharge planning Services  CM Consult  Post Acute Care Choice:    Choice offered to:     DME Arranged:    DME Agency:     HH Arranged:    HH Agency:     Status of Service:  In process, will continue to follow  If discussed at Long Length of Stay Meetings, dates discussed:    Additional Comments:  [name], Daniel Towner S, RN 09/08/2016, 1:34 PM

## 2016-09-08 NOTE — Progress Notes (Signed)
Dr. Tyrone SageGerhardt paged. Updated of pt condition. Informed of pt having increased work of breathing and presence of crackles in the bases. New orders received.

## 2016-09-08 NOTE — Progress Notes (Signed)
[facility] BRIEF SICU PROGRESS NOTE  3 Days Post-Op  S/P Procedure(s) (LRB): AORTIC VALVE REPLACEMENT (AVR) USING 23 MM MAGNA EASE PERICARDIAL TISSUE VALVE (N/A) TRANSESOPHAGEAL ECHOCARDIOGRAM (TEE) (N/A) CORONARY ARTERY BYPASS GRAFTING (CABG) USING LIMA TO DISTAL LAD AND ENDOSCOPICALLY HARVESTED GREATER SAPHENOUS VEIN TO CIRC. (N/A) ENDOVEIN HARVEST OF GREATER SAPHENOUS VEIN (Right)   Resting comfortably at present w/ quiet respirations NSR/sinus tach w/ stable BP O2 sats 99% on 3 L/min  Excellent UOP  Plan: Continue current plan  Purcell Nailslarence H Owen, MD 09/08/2016 8:44 PM

## 2016-09-08 NOTE — Progress Notes (Addendum)
TCTS DAILY ICU PROGRESS NOTE                   [address]            [place] [address]          [phone]   3 Days Post-Op Procedure(s) (LRB): AORTIC VALVE REPLACEMENT (AVR) USING 23 MM MAGNA EASE PERICARDIAL TISSUE VALVE (N/A) TRANSESOPHAGEAL ECHOCARDIOGRAM (TEE) (N/A) CORONARY ARTERY BYPASS GRAFTING (CABG) USING LIMA TO DISTAL LAD AND ENDOSCOPICALLY HARVESTED GREATER SAPHENOUS VEIN TO CIRC. (N/A) ENDOVEIN HARVEST OF GREATER SAPHENOUS VEIN (Right)  Total Length of Stay:  LOS: 3 days   Subjective:  Patient has no specific complaints.  However he is just staring blankly when spoken too.  He is attempting to urinate which he has been unable to do.  + ambulation yesterday, which patient states was difficult  Objective: Vital signs in last 24 hours: Temp:  [97.5 F (36.4 C)-98.6 F (37 C)] 97.6 F (36.4 C) (08/02 0357) Pulse Rate:  [80-111] 89 (08/02 0700) Cardiac Rhythm: Normal sinus rhythm;Sinus tachycardia (08/02 0400) Resp:  [0-27] 10 (08/02 0700) BP: (90-151)/(51-76) 97/53 (08/02 0700) SpO2:  [86 %-100 %] 97 % (08/02 0700) FiO2 (%):  [36 %] 36 % (08/01 1413) Weight:  [206 lb 12.7 oz (93.8 kg)] 206 lb 12.7 oz (93.8 kg) (08/02 0600)  Filed Weights   09/06/16 0600 09/07/16 0500 09/08/16 0600  Weight: 206 lb 8 oz (93.7 kg) 206 lb 4.8 oz (93.6 kg) 206 lb 12.7 oz (93.8 kg)    Weight change: 7.9 oz (0.223 kg)   Intake/Output from previous day: 08/01 0701 - 08/02 0700 In: 683 [P.O.:340; I.V.:293; IV Piggyback:50] Out: 790 [Urine:590; Chest Tube:200]  Current Meds: Scheduled Meds: . acetaminophen  1,000 mg Oral Q6H   Or  . acetaminophen (TYLENOL) oral liquid 160 mg/5 mL  1,000 mg Per Tube Q6H  . aspirin EC  325 mg Oral Daily   Or  . aspirin  324 mg Per Tube Daily  . atorvastatin  40 mg Oral Daily  . bisacodyl  10 mg Oral Daily   Or  . bisacodyl  10 mg Rectal Daily  . chlorhexidine gluconate (MEDLINE KIT)  15 mL Mouth Rinse BID  . Chlorhexidine  Gluconate Cloth  6 each Topical Daily  . Chlorhexidine Gluconate Cloth  6 each Topical Daily  . docusate sodium  200 mg Oral Daily  . enoxaparin (LOVENOX) injection  30 mg Subcutaneous QHS  . furosemide  40 mg Intravenous Once  . levalbuterol  0.63 mg Nebulization TID  . loratadine  10 mg Oral Daily  . mouth rinse  15 mL Mouth Rinse QID  . mometasone-formoterol  2 puff Inhalation BID  . mupirocin ointment  1 application Nasal BID  . pantoprazole  40 mg Oral Daily  . potassium chloride  20 mEq Oral Daily  . sodium chloride flush  10-40 mL Intracatheter Q12H  . sodium chloride flush  3 mL Intravenous Q12H  . tiotropium  18 mcg Inhalation Daily   Continuous Infusions: . sodium chloride 10 mL/hr at 09/06/16 0600  . sodium chloride    . sodium chloride 20 mL/hr at 09/06/16 0600  . lactated ringers 10 mL/hr at 09/06/16 0600  . lactated ringers 10 mL/hr at 09/07/16 0622   PRN Meds:.sodium chloride, albuterol, fluticasone, metoprolol tartrate, ondansetron (ZOFRAN) IV, oxyCODONE, sodium chloride flush, sodium chloride flush, traMADol  General appearance:coooperative, appears sedated, mild jaundice appearance Heart: regular rate and rhythm  Lungs: diminished breath sounds bibasilar Abdomen: soft, non-tender; bowel sounds normal; no masses,  no organomegaly Extremities: edema pitting bilaterally Wound: clean and dry, ecchymosis of RLE  Lab Results: CBC: Recent Labs  09/06/16 1626 09/06/16 1639 09/07/16 0316  WBC 15.1*  --  13.5*  HGB 9.9* 9.5* 10.3*  HCT 29.3* 28.0* 31.0*  PLT 90*  --  74*   BMET:  Recent Labs  09/06/16 0352  09/06/16 1639 09/07/16 0316  NA 138  --  138 134*  K 4.3  --  4.6 4.5  CL 108  --  100* 101  CO2 25  --   --  27  GLUCOSE 126*  --  129* 135*  BUN 9  --  16 16  CREATININE 0.68  < > 0.70 0.65  CALCIUM 7.5*  --   --  7.9*  < > = values in this interval not displayed.  CMET: Lab Results  Component Value Date   WBC 13.5 (H) 09/07/2016   HGB 10.3  (L) 09/07/2016   HCT 31.0 (L) 09/07/2016   PLT 74 (L) 09/07/2016   GLUCOSE 135 (H) 09/07/2016   CHOL 136 05/16/2016   TRIG 225 (H) 05/16/2016   HDL 35 (L) 05/16/2016   LDLCALC 56 05/16/2016   ALT 18 09/01/2016   AST 29 09/01/2016   NA 134 (L) 09/07/2016   K 4.5 09/07/2016   CL 101 09/07/2016   CREATININE 0.65 09/07/2016   BUN 16 09/07/2016   CO2 27 09/07/2016   PSA 1.4 11/19/2015   INR 1.64 09/05/2016   HGBA1C 5.8 (H) 09/01/2016      PT/INR:  Recent Labs  09/05/16 1500  LABPROT 19.6*  INR 1.64   Radiology: Dg Chest Port 1 View  Result Date: 09/08/2016 CLINICAL DATA:  Chest tube. EXAM: PORTABLE CHEST 1 VIEW COMPARISON:  09/07/2016.  09/06/2016. FINDINGS: Interval removal of left chest tube. Tiny residual left apical pneumothorax cannot be completely excluded. Right IJ sheath in stable position. Prior CABG and cardiac valve replacement. Cardiomegaly with basilar interstitial prominence and bilateral pleural effusions noted consistent CHF. IMPRESSION: 1. Interim removal of left chest tube. Tiny residual left apical pneumothorax cannot be excluded. Right IJ sheath in stable position. 2. Prior CABG and cardiac valve replacement. Cardiomegaly with bibasilar pulmonary interstitial prominence and bilateral pleural effusions consistent with CHF noted on today's exam . Electronically Signed   By: [place]   On: 09/08/2016 06:42     Assessment/Plan: S/P Procedure(s) (LRB): AORTIC VALVE REPLACEMENT (AVR) USING 23 MM MAGNA EASE PERICARDIAL TISSUE VALVE (N/A) TRANSESOPHAGEAL ECHOCARDIOGRAM (TEE) (N/A) CORONARY ARTERY BYPASS GRAFTING (CABG) USING LIMA TO DISTAL LAD AND ENDOSCOPICALLY HARVESTED GREATER SAPHENOUS VEIN TO CIRC. (N/A) ENDOVEIN HARVEST OF GREATER SAPHENOUS VEIN (Right)  1. CV- off all drips, BP improved SBP 130s this morning- will monitor if remains stable will restart BB tomorrow, check CO-OX  2. Pulm- wean oxygen as tolerated, CXR stable post CXR from pneumothorax  standpoint, there is evidence of pulmonary edema/CHF changes 3. Renal- labs not drawn, ordered STAT, remains hypervolemic per weight... Will give IV Lasix 40 mg today 4. GU- urinary retention, has struggled voiding since yesterday.[name] Required I/O cath will place foley for accurate I/O monitoring, and IV diuresis, will start Flomax 5. Expected blood loss anemia- Hgb was 10.3 yesterday, waiting repeat CBC this AM 6. GI- H/O fatty liver and elevated LFTS--- patient looks mildly jaundiced this morning.. Will recheck LFTS 7. Dispo- patient looks sedated this morning, will further decrease narcotic  dose.  PICC line placement, will d/c central line, IV lasix this morning for CHF on CXR, patient doesn't look as good today, will check lab when completed address any issues present at that time, continue current care and remain in ICU for close monitoring   BARRETT, ERIN 09/08/2016 7:58 AM   pateint awake and knows where he is , pupils small likely too much narcotic  Decrease narcotics Replace foley Check h/h and labs today Hold lovenox pending plt count  I have seen and examined Robin Searing and agree with the above assessment  and plan.  Grace Isaac MD Beeper [phone] Office [phone] 09/08/2016 8:23 AM  Addendum This am labs reviewed, lfts and bili normal, plts increasing, renal function stable, h/h 26/8.6 E Leya Paige

## 2016-09-08 NOTE — Progress Notes (Signed)
Peripherally Inserted Central Catheter/Midline Placement  The IV Nurse has discussed with the patient and/or persons authorized to consent for the patient, the purpose of this procedure and the potential benefits and risks involved with this procedure.  The benefits include less needle sticks, lab draws from the catheter, and the patient may be discharged home with the catheter. Risks include, but not limited to, infection, bleeding, blood clot (thrombus formation), and puncture of an artery; nerve damage and irregular heartbeat and possibility to perform a PICC exchange if needed/ordered by physician.  Alternatives to this procedure were also discussed.  B[employer] Power PICC patient education guide, fact sheet on infection prevention and patient information card has been provided to patient /or left at bedside.    PICC/Midline Placement Documentation        [name], [name] 09/08/2016, 10:57 AM

## 2016-09-09 ENCOUNTER — Inpatient Hospital Stay (HOSPITAL_COMMUNITY): Payer: BLUE CROSS/BLUE SHIELD

## 2016-09-09 LAB — COMPREHENSIVE METABOLIC PANEL
ALT: 14 U/L — ABNORMAL LOW (ref 17–63)
AST: 20 U/L (ref 15–41)
Albumin: 2.8 g/dL — ABNORMAL LOW (ref 3.5–5.0)
Alkaline Phosphatase: 36 U/L — ABNORMAL LOW (ref 38–126)
Anion gap: 6 (ref 5–15)
BUN: 14 mg/dL (ref 6–20)
CO2: 33 mmol/L — ABNORMAL HIGH (ref 22–32)
Calcium: 8.2 mg/dL — ABNORMAL LOW (ref 8.9–10.3)
Chloride: 99 mmol/L — ABNORMAL LOW (ref 101–111)
Creatinine, Ser: 0.55 mg/dL — ABNORMAL LOW (ref 0.61–1.24)
GFR calc Af Amer: >60 mL/min (ref >60)
GFR calc non Af Amer: >60 mL/min (ref >60)
Glucose, Bld: 117 mg/dL — ABNORMAL HIGH (ref 65–99)
Potassium: 3.9 mmol/L (ref 3.5–5.1)
Sodium: 138 mmol/L (ref 135–145)
Total Bilirubin: 1 mg/dL (ref 0.3–1.2)
Total Protein: 5 g/dL — ABNORMAL LOW (ref 6.5–8.1)

## 2016-09-09 LAB — CBC
HCT: 25.3 % — ABNORMAL LOW (ref 39.0–52.0)
Hemoglobin: 8.3 g/dL — ABNORMAL LOW (ref 13.0–17.0)
MCH: 29.6 pg (ref 26.0–34.0)
MCHC: 32.8 g/dL (ref 30.0–36.0)
MCV: 90.4 fL (ref 78.0–100.0)
Platelets: 106 10*3/uL — ABNORMAL LOW (ref 150–400)
RBC: 2.8 MIL/uL — ABNORMAL LOW (ref 4.22–5.81)
RDW: 14 % (ref 11.5–15.5)
WBC: 10 10*3/uL (ref 4.0–10.5)

## 2016-09-09 LAB — COOXEMETRY PANEL
Carboxyhemoglobin: 2.3 % — ABNORMAL HIGH (ref 0.5–1.5)
Methemoglobin: 0.7 % (ref 0.0–1.5)
O2 Saturation: 66.4 %
Total hemoglobin: 8.6 g/dL — ABNORMAL LOW (ref 12.0–16.0)

## 2016-09-09 MED ORDER — FUROSEMIDE 10 MG/ML IJ SOLN
40.0000 mg | Freq: Once | INTRAMUSCULAR | Status: AC
  Administered 2016-09-09: 40 mg via INTRAVENOUS
  Filled 2016-09-09: qty 4

## 2016-09-09 NOTE — Plan of Care (Signed)
Problem: Bowel/Gastric: Goal: Gastrointestinal status for postoperative course will improve Outcome: Progressing Patient verbalized understanding  Problem: Cardiac: Goal: Hemodynamic stability will improve Outcome: Progressing Patient verbalized understanding Goal: Ability to maintain an adequate cardiac output will improve Outcome: Progressing Patient verbalized understanding Goal: Will show no signs and symptoms of excessive bleeding Outcome: Progressing Patient verbalized understanding  Problem: Education: Goal: Ability to demonstrate proper wound care will improve Outcome: Progressing Patient verbalized understanding Goal: Knowledge of disease or condition will improve Outcome: Progressing Patient verbalized understanding Goal: Knowledge of the prescribed therapeutic regimen will improve Outcome: Progressing Patient verbalized understanding  Problem: Coping: Goal: Ability to adjust to condition or change in health will improve Outcome: Progressing Patient verbalized understanding

## 2016-09-09 NOTE — Progress Notes (Addendum)
Patient ID: Daniel BoutonKenneth D [name], male   DOB: Jun 17, 1950, 66 y.o.   MRN: [id number] TCTS DAILY ICU PROGRESS NOTE                   [address]            [place] [id number]          [phone]   4 Days Post-Op Procedure(s) (LRB): AORTIC VALVE REPLACEMENT (AVR) USING 23 MM MAGNA EASE PERICARDIAL TISSUE VALVE (N/A) TRANSESOPHAGEAL ECHOCARDIOGRAM (TEE) (N/A) CORONARY ARTERY BYPASS GRAFTING (CABG) USING LIMA TO DISTAL LAD AND ENDOSCOPICALLY HARVESTED GREATER SAPHENOUS VEIN TO CIRC. (N/A) ENDOVEIN HARVEST OF GREATER SAPHENOUS VEIN (Right)  Total Length of Stay:  LOS: 4 days   Subjective: Awake and alert , up in chair , walked this am , foley back in for urinary retention yesterday   Objective: Vital signs in last 24 hours: Temp:  [97.9 F (36.6 C)-99.2 F (37.3 C)] 99.2 F (37.3 C) (08/03 0407) Pulse Rate:  [90-104] 94 (08/03 0600) Cardiac Rhythm: Normal sinus rhythm (08/03 0400) Resp:  [0-20] 14 (08/03 0600) BP: (103-134)/(54-73) 122/63 (08/03 0600) SpO2:  [95 %-100 %] 98 % (08/03 0600)  Filed Weights   09/06/16 0600 09/07/16 0500 09/08/16 0600  Weight: 206 lb 8 oz (93.7 kg) 206 lb 4.8 oz (93.6 kg) 206 lb 12.7 oz (93.8 kg)    Weight change:    Hemodynamic parameters for last 24 hours:    Intake/Output from previous day: 08/02 0701 - 08/03 0700 In: 2753.3 [P.O.:700; I.V.:2053.3] Out: 3400 [Urine:3400]  Intake/Output this shift: No intake/output data recorded.  Current Meds: Scheduled Meds: . acetaminophen  1,000 mg Oral Q6H   Or  . acetaminophen (TYLENOL) oral liquid 160 mg/5 mL  1,000 mg Per Tube Q6H  . aspirin EC  325 mg Oral Daily   Or  . aspirin  324 mg Per Tube Daily  . atorvastatin  40 mg Oral Daily  . bisacodyl  10 mg Oral Daily   Or  . bisacodyl  10 mg Rectal Daily  . Chlorhexidine Gluconate Cloth  6 each Topical Daily  . Chlorhexidine Gluconate Cloth  6 each Topical Daily  . Chlorhexidine Gluconate Cloth  6 each Topical Daily  .  docusate sodium  200 mg Oral Daily  . levalbuterol  0.63 mg Nebulization Q6H  . loratadine  10 mg Oral Daily  . mouth rinse  15 mL Mouth Rinse BID  . mometasone-formoterol  2 puff Inhalation BID  . mupirocin ointment  1 application Nasal BID  . pantoprazole  40 mg Oral Daily  . potassium chloride  20 mEq Oral Daily  . sodium chloride flush  10-40 mL Intracatheter Q12H  . sodium chloride flush  10-40 mL Intracatheter Q12H  . sodium chloride flush  3 mL Intravenous Q12H  . tiotropium  18 mcg Inhalation Daily   Continuous Infusions: . sodium chloride Stopped (09/08/16 2200)  . sodium chloride    . sodium chloride Stopped (09/08/16 2200)  . lactated ringers Stopped (09/08/16 1340)  . lactated ringers Stopped (09/08/16 1340)   PRN Meds:.sodium chloride, fluticasone, levalbuterol, metoprolol tartrate, ondansetron (ZOFRAN) IV, oxyCODONE, sodium chloride flush, sodium chloride flush, sodium chloride flush, traMADol  General appearance: alert, cooperative and no distress Neurologic: intact Heart: regular rate and rhythm, S1, S2 normal, no murmur, click, rub or gallop Lungs: diminished breath sounds bibasilar Abdomen: soft, non-tender; bowel sounds normal; no masses,  no organomegaly Extremities: extremities normal, atraumatic, no cyanosis  or edema and Homans sign is negative, no sign of DVT Wound: sternum stable   Lab Results: CBC: Recent Labs  09/08/16 1725 09/09/16 0545  WBC 11.1* 10.0  HGB 8.3* 8.3*  HCT 24.8* 25.3*  PLT 95* 106*   BMET:  Recent Labs  09/08/16 1725 09/09/16 0545  NA 137 138  K 4.1 3.9  CL 100* 99*  CO2 30 33*  GLUCOSE 128* 117*  BUN 17 14  CREATININE 0.57* 0.55*  CALCIUM 8.3* 8.2*    CMET: Lab Results  Component Value Date   WBC 10.0 09/09/2016   HGB 8.3 (L) 09/09/2016   HCT 25.3 (L) 09/09/2016   PLT 106 (L) 09/09/2016   GLUCOSE 117 (H) 09/09/2016   CHOL 136 05/16/2016   TRIG 225 (H) 05/16/2016   HDL 35 (L) 05/16/2016   LDLCALC 56  05/16/2016   ALT 14 (L) 09/09/2016   AST 20 09/09/2016   NA 138 09/09/2016   K 3.9 09/09/2016   CL 99 (L) 09/09/2016   CREATININE 0.55 (L) 09/09/2016   BUN 14 09/09/2016   CO2 33 (H) 09/09/2016   PSA 1.4 11/19/2015   INR 1.64 09/05/2016   HGBA1C 5.8 (H) 09/01/2016      PT/INR: No results for input(s): LABPROT, INR in the last 72 hours. Radiology: Dg Chest Port 1 View  Result Date: 09/08/2016 CLINICAL DATA:  RIGHT ARM PICC LINE INSERTION EXAM: PORTABLE CHEST 1 VIEW COMPARISON:  Chest x-ray from earlier same day. FINDINGS: Interval PICC line placement, with tip adequately positioned at the level of the mid/upper SVC. Heart size and mediastinal contours stable. Continued central pulmonary vascular congestion and probable bibasilar edema/effusions, unchanged in the short-term interval. No new lung findings. IMPRESSION: PICC line is adequately positioned with tip at the level of the mid/upper SVC. Could consider advancing approximately 5 cm for more optimal radiographic positioning at the cavoatrial junction. No other interval change. Electronically Signed   By: Bary RichardStan  Maynard M.D.   On: 09/08/2016 12:43   Wt Readings from Last 3 Encounters:  09/08/16 206 lb 12.7 oz (93.8 kg)  09/01/16 190 lb (86.2 kg)  08/29/16 190 lb (86.2 kg)   Cox 66   Assessment/Plan: S/P Procedure(s) (LRB): AORTIC VALVE REPLACEMENT (AVR) USING 23 MM MAGNA EASE PERICARDIAL TISSUE VALVE (N/A) TRANSESOPHAGEAL ECHOCARDIOGRAM (TEE) (N/A) CORONARY ARTERY BYPASS GRAFTING (CABG) USING LIMA TO DISTAL LAD AND ENDOSCOPICALLY HARVESTED GREATER SAPHENOUS VEIN TO CIRC. (N/A) ENDOVEIN HARVEST OF GREATER SAPHENOUS VEIN (Right) Mobilize Diuresis Diabetes control Weight still up 15 lbs- further lasix today plts increasing Some contraction alkalosis  Foley in place for urinary retention   [name] 09/09/2016 7:11 AM

## 2016-09-09 NOTE — Progress Notes (Signed)
      [address]       [place] [id number]             [phone]      POD # 4 AVR, CABG  BP 139/78   Pulse (!) 110   Temp 99.4 F (37.4 C) (Oral)   Resp 20   Ht 5\' 9"  (1.753 m)   Wt 198 lb 13.7 oz (90.2 kg)   SpO2 95%   BMI 29.37 kg/m    Intake/Output Summary (Last 24 hours) at 09/09/16 1915 Last data filed at 09/09/16 1800  Gross per 24 hour  Intake          2826.34 ml  Output             2480 ml  Net           346.34 ml   No PM labs  Viviann SpareSteven C. Dorris FetchHendrickson, MD [facility] [phone]

## 2016-09-10 ENCOUNTER — Inpatient Hospital Stay (HOSPITAL_COMMUNITY): Payer: BLUE CROSS/BLUE SHIELD

## 2016-09-10 LAB — CBC
HCT: 31 % — ABNORMAL LOW (ref 39.0–52.0)
Hemoglobin: 10.3 g/dL — ABNORMAL LOW (ref 13.0–17.0)
MCH: 29.9 pg (ref 26.0–34.0)
MCHC: 33.2 g/dL (ref 30.0–36.0)
MCV: 89.9 fL (ref 78.0–100.0)
Platelets: 127 10*3/uL — ABNORMAL LOW (ref 150–400)
RBC: 3.45 MIL/uL — ABNORMAL LOW (ref 4.22–5.81)
RDW: 14 % (ref 11.5–15.5)
WBC: 7.7 10*3/uL (ref 4.0–10.5)

## 2016-09-10 LAB — BASIC METABOLIC PANEL
Anion gap: 7 (ref 5–15)
BUN: 11 mg/dL (ref 6–20)
CO2: 34 mmol/L — ABNORMAL HIGH (ref 22–32)
Calcium: 8.3 mg/dL — ABNORMAL LOW (ref 8.9–10.3)
Chloride: 96 mmol/L — ABNORMAL LOW (ref 101–111)
Creatinine, Ser: 0.52 mg/dL — ABNORMAL LOW (ref 0.61–1.24)
GFR calc Af Amer: >60 mL/min (ref >60)
GFR calc non Af Amer: >60 mL/min (ref >60)
Glucose, Bld: 115 mg/dL — ABNORMAL HIGH (ref 65–99)
Potassium: 3.6 mmol/L (ref 3.5–5.1)
Sodium: 137 mmol/L (ref 135–145)

## 2016-09-10 MED ORDER — POTASSIUM CHLORIDE CRYS ER 20 MEQ PO TBCR
40.0000 meq | EXTENDED_RELEASE_TABLET | Freq: Two times a day (BID) | ORAL | Status: DC
  Administered 2016-09-10 – 2016-09-13 (×7): 40 meq via ORAL
  Filled 2016-09-10 (×7): qty 2

## 2016-09-10 MED ORDER — TAMSULOSIN HCL 0.4 MG PO CAPS
0.4000 mg | ORAL_CAPSULE | Freq: Every day | ORAL | Status: DC
  Administered 2016-09-10 – 2016-09-19 (×10): 0.4 mg via ORAL
  Filled 2016-09-10 (×10): qty 1

## 2016-09-10 MED ORDER — FUROSEMIDE 40 MG PO TABS
40.0000 mg | ORAL_TABLET | Freq: Two times a day (BID) | ORAL | Status: DC
  Administered 2016-09-10 – 2016-09-13 (×7): 40 mg via ORAL
  Filled 2016-09-10 (×8): qty 1

## 2016-09-10 NOTE — Progress Notes (Signed)
5 Days Post-Op Procedure(s) (LRB): AORTIC VALVE REPLACEMENT (AVR) USING 23 MM MAGNA EASE PERICARDIAL TISSUE VALVE (N/A) TRANSESOPHAGEAL ECHOCARDIOGRAM (TEE) (N/A) CORONARY ARTERY BYPASS GRAFTING (CABG) USING LIMA TO DISTAL LAD AND ENDOSCOPICALLY HARVESTED GREATER SAPHENOUS VEIN TO CIRC. (N/A) ENDOVEIN HARVEST OF GREATER SAPHENOUS VEIN (Right) Subjective: Feels better this morning  Objective: Vital signs in last 24 hours: Temp:  [97.6 F (36.4 C)-99.4 F (37.4 C)] 98.6 F (37 C) (08/04 0758) Pulse Rate:  [91-110] 98 (08/04 0952) Cardiac Rhythm: Normal sinus rhythm;Sinus tachycardia (08/04 0400) Resp:  [11-30] 21 (08/04 0952) BP: (99-139)/(59-79) 106/78 (08/04 0900) SpO2:  [93 %-100 %] 98 % (08/04 0952) Weight:  [192 lb 14.4 oz (87.5 kg)-198 lb 13.7 oz (90.2 kg)] 192 lb 14.4 oz (87.5 kg) (08/04 0500)  Hemodynamic parameters for last 24 hours:    Intake/Output from previous day: 08/03 0701 - 08/04 0700 In: 1623 [P.O.:640; I.V.:43] Out: 2280 [Urine:2280] Intake/Output this shift: Total I/O In: -  Out: 100 [Urine:100]  General appearance: alert, cooperative and no distress Neurologic: intact Heart: regular rate and rhythm Lungs: diminished breath sounds bibasilar Abdomen: normal findings: soft, non-tender  Lab Results:  Recent Labs  09/09/16 0545 09/10/16 0535  WBC 10.0 7.7  HGB 8.3* 10.3*  HCT 25.3* 31.0*  PLT 106* 127*   BMET:  Recent Labs  09/09/16 0545 09/10/16 0535  NA 138 137  K 3.9 3.6  CL 99* 96*  CO2 33* 34*  GLUCOSE 117* 115*  BUN 14 11  CREATININE 0.55* 0.52*  CALCIUM 8.2* 8.3*    PT/INR: No results for input(s): LABPROT, INR in the last 72 hours. ABG    Component Value Date/Time   PHART 7.287 (L) 09/05/2016 2325   HCO3 23.3 09/05/2016 2325   TCO2 24 09/06/2016 1639   ACIDBASEDEF 3.0 (H) 09/05/2016 2325   O2SAT 66.4 09/09/2016 0540   CBG (last 3)  No results for input(s): GLUCAP in the last 72 hours.  Assessment/Plan: S/P  Procedure(s) (LRB): AORTIC VALVE REPLACEMENT (AVR) USING 23 MM MAGNA EASE PERICARDIAL TISSUE VALVE (N/A) TRANSESOPHAGEAL ECHOCARDIOGRAM (TEE) (N/A) CORONARY ARTERY BYPASS GRAFTING (CABG) USING LIMA TO DISTAL LAD AND ENDOSCOPICALLY HARVESTED GREATER SAPHENOUS VEIN TO CIRC. (N/A) ENDOVEIN HARVEST OF GREATER SAPHENOUS VEIN (Right) -  CV- stable, in SR  RESP- respiratory status has improved with current regimen- continue diuresis, nebulizers  RENAL- creatinine OK, continue diuresis with PO lasix  Supplement K  Foley replaced for urinary retention- start flomax, keep Foley today  ENDO- CBG well controlled  Deconditioning- cardiac rehab  SCD + enoxaparin for DVT prophylaxis   LOS: 5 days    Loreli SlotSteven C Saira Kramme 09/10/2016

## 2016-09-10 NOTE — Progress Notes (Signed)
      [address]       [place] [id number]             [phone]      Up in chair  No complaints  Hasn't walked today- should be going at least BID  BP 132/77   Pulse 99   Temp 99 F (37.2 C) (Oral)   Resp 20   Ht 5\' 9"  (1.753 m)   Wt 192 lb 14.4 oz (87.5 kg)   SpO2 98%   BMI 28.49 kg/m    Intake/Output Summary (Last 24 hours) at 09/10/16 1741 Last data filed at 09/10/16 1600  Gross per 24 hour  Intake             1450 ml  Output             1150 ml  Net              300 ml    Awaiting 4E bed  Viviann SpareSteven C. Dorris FetchHendrickson, MD [facility] [phone]

## 2016-09-11 ENCOUNTER — Inpatient Hospital Stay (HOSPITAL_COMMUNITY): Payer: BLUE CROSS/BLUE SHIELD

## 2016-09-11 LAB — CBC
HCT: 25.4 % — ABNORMAL LOW (ref 39.0–52.0)
Hemoglobin: 8.4 g/dL — ABNORMAL LOW (ref 13.0–17.0)
MCH: 29.9 pg (ref 26.0–34.0)
MCHC: 33.1 g/dL (ref 30.0–36.0)
MCV: 90.4 fL (ref 78.0–100.0)
Platelets: 173 10*3/uL (ref 150–400)
RBC: 2.81 MIL/uL — ABNORMAL LOW (ref 4.22–5.81)
RDW: 13.8 % (ref 11.5–15.5)
WBC: 9.5 10*3/uL (ref 4.0–10.5)

## 2016-09-11 LAB — BASIC METABOLIC PANEL
Anion gap: 5 (ref 5–15)
BUN: 16 mg/dL (ref 6–20)
CO2: 35 mmol/L — ABNORMAL HIGH (ref 22–32)
Calcium: 8.6 mg/dL — ABNORMAL LOW (ref 8.9–10.3)
Chloride: 94 mmol/L — ABNORMAL LOW (ref 101–111)
Creatinine, Ser: 0.51 mg/dL — ABNORMAL LOW (ref 0.61–1.24)
GFR calc Af Amer: >60 mL/min (ref >60)
GFR calc non Af Amer: >60 mL/min (ref >60)
Glucose, Bld: 119 mg/dL — ABNORMAL HIGH (ref 65–99)
Potassium: 4 mmol/L (ref 3.5–5.1)
Sodium: 134 mmol/L — ABNORMAL LOW (ref 135–145)

## 2016-09-11 MED ORDER — OXYCODONE HCL 5 MG PO TABS
5.0000 mg | ORAL_TABLET | ORAL | Status: DC | PRN
  Administered 2016-09-11 – 2016-09-12 (×4): 10 mg via ORAL
  Administered 2016-09-12 (×2): 5 mg via ORAL
  Administered 2016-09-12 – 2016-09-17 (×13): 10 mg via ORAL
  Administered 2016-09-17: 5 mg via ORAL
  Administered 2016-09-18 (×3): 10 mg via ORAL
  Filled 2016-09-11 (×3): qty 2
  Filled 2016-09-11: qty 1
  Filled 2016-09-11 (×2): qty 2
  Filled 2016-09-11: qty 1
  Filled 2016-09-11 (×9): qty 2
  Filled 2016-09-11: qty 1
  Filled 2016-09-11 (×6): qty 2

## 2016-09-11 MED ORDER — SODIUM CHLORIDE 0.9% FLUSH: 3.0000 mL | INTRAVENOUS | Status: DC | PRN

## 2016-09-11 MED ORDER — GUAIFENESIN-DM 100-10 MG/5ML PO SYRP: 15.0000 mL | ORAL_SOLUTION | ORAL | Status: DC | PRN

## 2016-09-11 MED ORDER — ZOLPIDEM TARTRATE 5 MG PO TABS: 5.0000 mg | ORAL_TABLET | Freq: Every evening | ORAL | Status: DC | PRN

## 2016-09-11 MED ORDER — MOVING RIGHT ALONG BOOK
Freq: Once | Status: AC
  Administered 2016-09-11: 21:00:00
  Filled 2016-09-11 (×2): qty 1

## 2016-09-11 MED ORDER — SODIUM CHLORIDE 0.9 % IV SOLN
250.0000 mL | INTRAVENOUS | Status: DC | PRN
  Administered 2016-09-17 – 2016-09-19 (×2): 250 mL via INTRAVENOUS

## 2016-09-11 MED ORDER — SODIUM CHLORIDE 0.9% FLUSH
3.0000 mL | Freq: Two times a day (BID) | INTRAVENOUS | Status: DC
  Administered 2016-09-11 – 2016-09-16 (×2): 3 mL via INTRAVENOUS

## 2016-09-11 MED ORDER — ENOXAPARIN SODIUM 40 MG/0.4ML ~~LOC~~ SOLN
40.0000 mg | SUBCUTANEOUS | Status: DC
  Administered 2016-09-11 – 2016-09-15 (×5): 40 mg via SUBCUTANEOUS
  Filled 2016-09-11 (×5): qty 0.4

## 2016-09-11 MED ORDER — ALUM & MAG HYDROXIDE-SIMETH 200-200-20 MG/5ML PO SUSP: 15.0000 mL | Freq: Four times a day (QID) | ORAL | Status: DC | PRN

## 2016-09-11 MED ORDER — MAGNESIUM HYDROXIDE 400 MG/5ML PO SUSP: 30.0000 mL | Freq: Every day | ORAL | Status: DC | PRN

## 2016-09-11 NOTE — Progress Notes (Signed)
Patient ambulated in hall 560 feet. Pt on 4 liters [place]. Patient proud of how far he made it.  Will continue to monitor.  Delories HeinzMelissa Robinette Esters, RN

## 2016-09-11 NOTE — Progress Notes (Signed)
Patient ambulated in hall 370 feet. Patient desatted with oxygen saturation of 82% on 4 liters via nasal canula. Heart rate elevated to 130. Patient voiced no complaints.

## 2016-09-11 NOTE — Progress Notes (Signed)
Patient transferred from 4E s/p Cabg and AVR. Alert and oriented. SOB with exertion.Oriented to room and surroundinds. Telemetry applied and CCMD notified.

## 2016-09-11 NOTE — Progress Notes (Signed)
      [address]       [place] [id number]             [phone]      Asleep at present Wife says pain has been better  No new issues Awaiting 4E bed  Viviann SpareSteven C. Dorris FetchHendrickson, MD [facility] [phone]

## 2016-09-11 NOTE — Progress Notes (Signed)
6 Days Post-Op Procedure(s) (LRB): AORTIC VALVE REPLACEMENT (AVR) USING 23 MM MAGNA EASE PERICARDIAL TISSUE VALVE (N/A) TRANSESOPHAGEAL ECHOCARDIOGRAM (TEE) (N/A) CORONARY ARTERY BYPASS GRAFTING (CABG) USING LIMA TO DISTAL LAD AND ENDOSCOPICALLY HARVESTED GREATER SAPHENOUS VEIN TO CIRC. (N/A) ENDOVEIN HARVEST OF GREATER SAPHENOUS VEIN (Right) Subjective: C/o increased incisional pain, feels like there is "a catch" when he coughs  Objective: Vital signs in last 24 hours: Temp:  [98.7 F (37.1 C)-99.2 F (37.3 C)] 98.8 F (37.1 C) (08/05 0915) Pulse Rate:  [96-118] 104 (08/05 0900) Cardiac Rhythm: Normal sinus rhythm;Sinus tachycardia (08/05 0400) Resp:  [13-27] 18 (08/05 0900) BP: (100-146)/(51-82) 111/77 (08/05 0900) SpO2:  [80 %-100 %] 98 % (08/05 0900) Weight:  [195 lb 5.2 oz (88.6 kg)] 195 lb 5.2 oz (88.6 kg) (08/05 0418)  Hemodynamic parameters for last 24 hours:    Intake/Output from previous day: 08/04 0701 - 08/05 0700 In: 610 [P.O.:600; I.V.:10] Out: 2275 [Urine:2275] Intake/Output this shift: No intake/output data recorded.  General appearance: alert, cooperative and no distress Neurologic: intact Heart: regular rate and rhythm Lungs: wheezes faint bilateral Abdomen: normal findings: soft, non-tender Extremities: edema 1+ Wound: sternum stable, wound clean and dry  Lab Results:  Recent Labs  09/10/16 0535 09/11/16 0410  WBC 7.7 9.5  HGB 10.3* 8.4*  HCT 31.0* 25.4*  PLT 127* 173   BMET:  Recent Labs  09/10/16 0535 09/11/16 0410  NA 137 134*  K 3.6 4.0  CL 96* 94*  CO2 34* 35*  GLUCOSE 115* 119*  BUN 11 16  CREATININE 0.52* 0.51*  CALCIUM 8.3* 8.6*    PT/INR: No results for input(s): LABPROT, INR in the last 72 hours. ABG    Component Value Date/Time   PHART 7.287 (L) 09/05/2016 2325   HCO3 23.3 09/05/2016 2325   TCO2 24 09/06/2016 1639   ACIDBASEDEF 3.0 (H) 09/05/2016 2325   O2SAT 66.4 09/09/2016 0540   CBG (last 3)  No results for  input(s): GLUCAP in the last 72 hours.  Assessment/Plan: S/P Procedure(s) (LRB): AORTIC VALVE REPLACEMENT (AVR) USING 23 MM MAGNA EASE PERICARDIAL TISSUE VALVE (N/A) TRANSESOPHAGEAL ECHOCARDIOGRAM (TEE) (N/A) CORONARY ARTERY BYPASS GRAFTING (CABG) USING LIMA TO DISTAL LAD AND ENDOSCOPICALLY HARVESTED GREATER SAPHENOUS VEIN TO CIRC. (N/A) ENDOVEIN HARVEST OF GREATER SAPHENOUS VEIN (Right) Plan for transfer to step-down: see transfer orders  Awaiting bed on 4E CV- stable in SR  RESP- COPD- continue meds, IS, diuresis  RENAL- creatinine stable, continue lasix  Foley replaced 8/3 for urinary retention- started on flomax yesterday  Consider voiding trial tomorrow  ENDO- no issues  PAIN- increased pain with coughing, feels a "catch"- I am unable to appreciate any significant sternal movement on exam although he is guarding to some degree with cough. Wires do not appear to have pulled through on CXR.  Only on low dose oxycodone- will increase to 5-10  Follow exam  Continue ambulation  LOS: 6 days    Loreli SlotSteven C Nocholas Damaso 09/11/2016

## 2016-09-12 ENCOUNTER — Inpatient Hospital Stay (HOSPITAL_COMMUNITY): Payer: BLUE CROSS/BLUE SHIELD

## 2016-09-12 ENCOUNTER — Encounter (HOSPITAL_COMMUNITY): Payer: Self-pay | Admitting: Cardiology

## 2016-09-12 DIAGNOSIS — I484 Atypical atrial flutter: Secondary | ICD-10-CM

## 2016-09-12 LAB — BASIC METABOLIC PANEL
Anion gap: 8 (ref 5–15)
BUN: 15 mg/dL (ref 6–20)
CO2: 32 mmol/L (ref 22–32)
Calcium: 8.9 mg/dL (ref 8.9–10.3)
Chloride: 96 mmol/L — ABNORMAL LOW (ref 101–111)
Creatinine, Ser: 0.57 mg/dL — ABNORMAL LOW (ref 0.61–1.24)
GFR calc Af Amer: >60 mL/min (ref >60)
GFR calc non Af Amer: >60 mL/min (ref >60)
Glucose, Bld: 133 mg/dL — ABNORMAL HIGH (ref 65–99)
Potassium: 4.4 mmol/L (ref 3.5–5.1)
Sodium: 136 mmol/L (ref 135–145)

## 2016-09-12 LAB — BLOOD GAS, ARTERIAL
Acid-Base Excess: 7.3 mmol/L — ABNORMAL HIGH (ref 0.0–2.0)
Bicarbonate: 31.4 mmol/L — ABNORMAL HIGH (ref 20.0–28.0)
Drawn by: 246861
O2 Content: 6 L/min
O2 Saturation: 91.5 %
Patient temperature: 98.6
pCO2 arterial: 46.1 mmHg (ref 32.0–48.0)
pH, Arterial: 7.448 (ref 7.350–7.450)
pO2, Arterial: 60.6 mmHg — ABNORMAL LOW (ref 83.0–108.0)

## 2016-09-12 LAB — CBC
HCT: 27.5 % — ABNORMAL LOW (ref 39.0–52.0)
Hemoglobin: 8.8 g/dL — ABNORMAL LOW (ref 13.0–17.0)
MCH: 29.1 pg (ref 26.0–34.0)
MCHC: 32 g/dL (ref 30.0–36.0)
MCV: 91.1 fL (ref 78.0–100.0)
Platelets: 270 10*3/uL (ref 150–400)
RBC: 3.02 MIL/uL — ABNORMAL LOW (ref 4.22–5.81)
RDW: 13.7 % (ref 11.5–15.5)
WBC: 11.9 10*3/uL — ABNORMAL HIGH (ref 4.0–10.5)

## 2016-09-12 LAB — TSH: TSH: 1.701 u[IU]/mL (ref 0.350–4.500)

## 2016-09-12 LAB — MAGNESIUM: Magnesium: 1.9 mg/dL (ref 1.7–2.4)

## 2016-09-12 MED ORDER — AMIODARONE LOAD VIA INFUSION
150.0000 mg | Freq: Once | INTRAVENOUS | Status: AC
  Administered 2016-09-12: 150 mg via INTRAVENOUS

## 2016-09-12 MED ORDER — IOPAMIDOL (ISOVUE-370) INJECTION 76%
INTRAVENOUS | Status: AC
  Administered 2016-09-12: 100 mL
  Filled 2016-09-12: qty 100

## 2016-09-12 MED ORDER — METOPROLOL TARTRATE 12.5 MG HALF TABLET
12.5000 mg | ORAL_TABLET | Freq: Two times a day (BID) | ORAL | Status: DC
  Administered 2016-09-12 (×2): 12.5 mg via ORAL
  Filled 2016-09-12 (×2): qty 1

## 2016-09-12 MED ORDER — AMIODARONE HCL IN DEXTROSE 360-4.14 MG/200ML-% IV SOLN
60.0000 mg/h | INTRAVENOUS | Status: DC
  Administered 2016-09-12: 60 mg/h via INTRAVENOUS

## 2016-09-12 MED ORDER — AMIODARONE HCL IN DEXTROSE 360-4.14 MG/200ML-% IV SOLN
INTRAVENOUS | Status: AC
  Administered 2016-09-12: 60 mg/h
  Filled 2016-09-12: qty 200

## 2016-09-12 MED ORDER — AMIODARONE HCL IN DEXTROSE 360-4.14 MG/200ML-% IV SOLN
INTRAVENOUS | Status: AC
  Filled 2016-09-12: qty 200

## 2016-09-12 MED ORDER — AMIODARONE HCL IN DEXTROSE 360-4.14 MG/200ML-% IV SOLN
30.0000 mg/h | INTRAVENOUS | Status: DC
  Administered 2016-09-12 (×3): 30 mg/h via INTRAVENOUS
  Filled 2016-09-12 (×2): qty 200

## 2016-09-12 NOTE — Consult Note (Signed)
Cardiology Consultation:   Patient ID: FARUQ ROSENBERGER; [id number]; Oct 12, 1950   Admit date: 09/05/2016 Date of Consult: 09/12/2016  Primary Care Provider: [name], MD Primary Cardiologist: Dr Purvis Sheffield    Patient Profile:   EANN CLELAND is a 66 y.o. male with a hx of Hypertension, hyperlipidemia, abdominal aortic aneurysm, COPD and now status post recent aortic valve replacement as well as coronary artery bypass and graft who is being seen today for the evaluation of atrial flutter at the request of Delight Ovens, MD.  History of Present Illness:   Patient had recent evaluation for aortic stenosis which showed severe left circumflex disease, moderate LAD disease and severe aortic stenosis. On July 31 the patient underwent coronary artery bypass and graft with a LIMA to the LAD and saphenous vein graft to circumflex. He also had aortic valve replacement with a pericardial tissue valve. He has done reasonably well postoperatively. This morning he developed atrial flutter with rapid ventricular response. He was paced out of this rhythm to sinus. Cardiology now asked to evaluate. At time of evaluation patient is dyspneic that he attributes to his COPD. He has chest pain when he coughs. He did not feel palpitations, dizziness with atrial flutter earlier this morning. He is presently in sinus on amiodarone.   Past Medical History:  Diagnosis Date  . AAA (abdominal aortic aneurysm) (HCC)   . COPD (chronic obstructive pulmonary disease) (HCC)   . Fatty liver   . Hyperlipidemia   . Hypertension   . Severe aortic stenosis 08/29/2016  . White coat hypertension     Past Surgical History:  Procedure Laterality Date  . ANKLE SURGERY Right 2009  . AORTIC VALVE REPLACEMENT N/A 09/05/2016   Procedure: AORTIC VALVE REPLACEMENT (AVR) USING 23 MM MAGNA EASE PERICARDIAL TISSUE VALVE;  Surgeon: Delight Ovens, MD;  Location: [facility] OR;  Service: Open Heart Surgery;  Laterality:  N/A;  . APPENDECTOMY    . CARDIAC CATHETERIZATION N/A 02/02/2016   Procedure: Left Heart Cath and Coronary Angiography;  Surgeon: Rinaldo Cloud, MD;  Location: [facility] INVASIVE CV LAB;  Service: Cardiovascular;  Laterality: N/A;  . COLONOSCOPY  06/22/2010   WUJ:[id number] rectal and right colon polyps removed remainder rectum and colon appared normal  . COLONOSCOPY WITH PROPOFOL N/A 05/15/2014   Procedure: COLONOSCOPY WITH PROPOFOL;  Surgeon: Corbin Ade, MD;  Location: [facility] ORS;  Service: Endoscopy;  Laterality: N/A;  In cecum @ [id number], out @ 0937, withdrawal time 19 minutes  . CORONARY ARTERY BYPASS GRAFT N/A 09/05/2016   Procedure: CORONARY ARTERY BYPASS GRAFTING (CABG) USING LIMA TO DISTAL LAD AND ENDOSCOPICALLY HARVESTED GREATER SAPHENOUS VEIN TO CIRC.;  Surgeon: Delight Ovens, MD;  Location: [facility] OR;  Service: Open Heart Surgery;  Laterality: N/A;  . ENDOVEIN HARVEST OF GREATER SAPHENOUS VEIN Right 09/05/2016   Procedure: ENDOVEIN HARVEST OF GREATER SAPHENOUS VEIN;  Surgeon: Delight Ovens, MD;  Location: [facility] OR;  Service: Open Heart Surgery;  Laterality: Right;  . POLYPECTOMY N/A 05/15/2014   Procedure: POLYPECTOMY;  Surgeon: Corbin Ade, MD;  Location: [facility] ORS;  Service: Endoscopy;  Laterality: N/A;  . RIGHT/LEFT HEART CATH AND CORONARY ANGIOGRAPHY N/A 08/29/2016   Procedure: Right/Left Heart Cath and Coronary Angiography;  Surgeon: Tonny Bollman, MD;  Location: [facility] INVASIVE CV LAB;  Service: Cardiovascular;  Laterality: N/A;  . TEE WITHOUT CARDIOVERSION N/A 09/05/2016   Procedure: TRANSESOPHAGEAL ECHOCARDIOGRAM (TEE);  Surgeon: Delight Ovens, MD;  Location: [facility] OR;  Service: Open Heart  Surgery;  Laterality: N/A;     Inpatient Medications: Scheduled Meds: . aspirin EC  325 mg Oral Daily   Or  . aspirin  324 mg Per Tube Daily  . atorvastatin  40 mg Oral Daily  . bisacodyl  10 mg Oral Daily   Or  . bisacodyl  10 mg Rectal Daily  . Chlorhexidine Gluconate Cloth  6 each Topical Daily    . docusate sodium  200 mg Oral Daily  . enoxaparin (LOVENOX) injection  40 mg Subcutaneous Q24H  . furosemide  40 mg Oral BID  . levalbuterol  0.63 mg Nebulization Q6H  . loratadine  10 mg Oral Daily  . mouth rinse  15 mL Mouth Rinse BID  . metoprolol tartrate  12.5 mg Oral BID  . mometasone-formoterol  2 puff Inhalation BID  . pantoprazole  40 mg Oral Daily  . potassium chloride  40 mEq Oral BID  . sodium chloride flush  10-40 mL Intracatheter Q12H  . sodium chloride flush  3 mL Intravenous Q12H  . tamsulosin  0.4 mg Oral QPC supper  . tiotropium  18 mcg Inhalation Daily   Continuous Infusions: . sodium chloride    . amiodarone    . amiodarone 30 mg/hr (09/12/16 1226)   PRN Meds: sodium chloride, alum & mag hydroxide-simeth, fluticasone, guaiFENesin-dextromethorphan, levalbuterol, magnesium hydroxide, metoprolol tartrate, ondansetron (ZOFRAN) IV, oxyCODONE, sodium chloride flush, sodium chloride flush, traMADol, zolpidem  Allergies:   No Known Allergies  Social History:   Social History   Social History  . Marital status: Married    Spouse name: N/A  . Number of children: N/A  . Years of education: N/A   Occupational History  . Not on file.   Social History Main Topics  . Smoking status: Former Smoker    Packs/day: 1.50    Years: 20.00    Types: Cigarettes    Quit date: 04/16/2002  . Smokeless tobacco: Never Used  . Alcohol use 3.0 oz/week    5 Cans of beer per week     Comment: per pt  . Drug use: No  . Sexual activity: No   Other Topics Concern  . Not on file   Social History Narrative  . No narrative on file    Family History:   Family History  Problem Relation Age of Onset  . Hypertension Sister   . AAA (abdominal aortic aneurysm) Brother   . Colon cancer Neg Hx   . Liver disease Neg Hx      ROS:  Please see the history of present illness.  ROS  Chest pain with cough; no F/C, hemoptysis, dysphagia, odynophagia, melena or hematochezia. Also some  weakness following procedure. All other ROS reviewed and negative.     Physical Exam/Data:   Vitals:   09/12/16 0547 09/12/16 0749 09/12/16 1009 09/12/16 1139  BP:   127/67 108/62  Pulse: (!) 146   96  Resp: 20   (!) 22  Temp:    98.6 F (37 C)  TempSrc:    Oral  SpO2: 95% 96%  93%  Weight:      Height:        Intake/Output Summary (Last 24 hours) at 09/12/16 1351 Last data filed at 09/12/16 1319  Gross per 24 hour  Intake              400 ml  Output             1850 ml  Net            -  1450 ml   Filed Weights   09/10/16 0500 09/11/16 0418 09/12/16 0431  Weight: 87.5 kg (192 lb 14.4 oz) 88.6 kg (195 lb 5.2 oz) 88 kg (194 lb 1.6 oz)   Body mass index is 28.66 kg/m.  General:  Well nourished, well developed, dyspneic at time of evaluation HEENT: normal Neck: supple Endocrine:  No thryomegaly Vascular: No carotid bruits; FA pulses 2+ bilaterally without bruits  Cardiac:  normal S1, S2; RRR; no murmur Lungs:  Diminished BS throughout Abd: soft, nontender, no hepatomegaly  Ext: trace edema Musculoskeletal:  No deformities, BUE and BLE strength normal and equal Skin: warm and dry  Neuro:  CNs 2-12 intact, no focal abnormalities noted Psych:  Normal affect   EKG:  The EKG was personally reviewed and demonstrates:  Initial electrocardiogram July 23 showed sinus rhythm with lateral T-wave inversion. Follow-up electrocardiogram showed probable atrial flutter with nonspecific ST changes. Telemetry:  Telemetry was personally reviewed and demonstrates:  Patient now back in sinus rhythm.   Laboratory Data:  Chemistry  Recent Labs Lab 09/10/16 0535 09/11/16 0410 09/12/16 0435  NA 137 134* 136  K 3.6 4.0 4.4  CL 96* 94* 96*  CO2 34* 35* 32  GLUCOSE 115* 119* 133*  BUN 11 16 15   CREATININE 0.52* 0.51* 0.57*  CALCIUM 8.3* 8.6* 8.9  GFRNONAA >60 >60 >60  GFRAA >60 >60 >60  ANIONGAP 7 5 8      Recent Labs Lab 09/08/16 0817 09/09/16 0545  PROT 5.1* 5.0*  ALBUMIN  3.0* 2.8*  AST 24 20  ALT 13* 14*  ALKPHOS 34* 36*  BILITOT 0.9 1.0   Hematology  Recent Labs Lab 09/10/16 0535 09/11/16 0410 09/12/16 0435  WBC 7.7 9.5 11.9*  RBC 3.45* 2.81* 3.02*  HGB 10.3* 8.4* 8.8*  HCT 31.0* 25.4* 27.5*  MCV 89.9 90.4 91.1  MCH 29.9 29.9 29.1  MCHC 33.2 33.1 32.0  RDW 14.0 13.8 13.7  PLT 127* 173 270   Radiology/Studies:  Dg Chest Port 1 View  Result Date: 09/12/2016 CLINICAL DATA:  Short of breath EXAM: PORTABLE CHEST 1 VIEW COMPARISON:  09/11/2016 FINDINGS: Sternotomy wires overlie enlarged cardiac silhouette. RIGHT PICC line remains. No effusion, infiltrate pneumothorax. Interval improvement of bilateral pleural effusions. Lungs are hyperinflated IMPRESSION: Improvement in bibasilar effusions.  Lungs are hyperinflated Electronically Signed   By: Genevive Bi M.D.   On: 09/12/2016 08:53   Dg Chest Port 1 View  Result Date: 09/11/2016 CLINICAL DATA:  Sore chest after CABG EXAM: PORTABLE CHEST 1 VIEW COMPARISON:  Yesterday FINDINGS: Haziness of the lower chest from atelectasis and pleural fluid. No pulmonary edema. Stable heart size post CABG and aortic valve replacement. Right upper extremity PICC with tip at the SVC level. No pneumothorax. IMPRESSION: Small pleural effusions and mild atelectasis. Electronically Signed   By: Marnee Spring M.D.   On: 09/11/2016 07:20   Dg Chest Port 1 View  Result Date: 09/10/2016 CLINICAL DATA:  Aortic valve replacement and CABG, postop day 5. EXAM: PORTABLE CHEST 1 VIEW COMPARISON:  09/09/2016 and prior exams FINDINGS: Cardiomegaly, cardiac surgical and aortic valve replacement changes and right PICC line with tip overlying the mid SVC again noted. Bibasilar atelectasis has improved. A trace bilateral pleural effusion is again noted. There is no evidence of pneumothorax. Mild pulmonary vascular congestion again identified. No other interval changes. IMPRESSION: Decreased bibasilar atelectasis, otherwise unchanged  appearance of the chest. No evidence of pneumothorax. Electronically Signed   By: Henrietta Hoover.D.  On: 09/10/2016 08:34   Dg Chest Port 1 View  Result Date: 09/09/2016 CLINICAL DATA:  Chest pain EXAM: PORTABLE CHEST 1 VIEW COMPARISON:  September 08, 2016 FINDINGS: Cordis has been removed. No pneumothorax. Right peripherally inserted central catheter tip is in the superior vena cava, unchanged. No evident pneumothorax. There is bibasilar atelectatic change with small pleural effusions bilaterally. There is no appreciable edema or consolidation. Heart is mildly enlarged. There is underlying bullous disease in the upper lobes. Pulmonary vascularity reflects the underlying bullous change in the upper lobes and is stable. No adenopathy evident. Patient is status post median sternotomy with coronary artery bypass grafting and aortic valve replacement. IMPRESSION: Central catheter tip in superior vena cava. No pneumothorax. Underlying upper lobe bullous disease. Scarring and atelectasis in the lung bases with small pleural effusions bilaterally. Heart is mildly enlarged. There may be a degree of underlying congestive heart failure. No consolidation. Electronically Signed   By: Bretta BangWilliam  Woodruff III M.D.   On: 09/09/2016 07:43    Assessment and Plan:   1. Atrial flutter-Likely postoperative from recent coronary artery bypass graft/aortic valve replacement. Back in sinus rhythm. Continue IV amiodarone today and transition to oral form tomorrow. Would continue for 8-12 weeks and if patient holds sinus rhythm discontinue at that time. CHADSvasc 2. However I think this is likely postoperative atrial arrhythmia and therefore does not require long-term anticoagulation. Continue metoprolol.   2. S/p AVR/CABG-plan to continue aspirin and statin. Continue metoprolol. Patient will require baseline echocardiogram status post aortic valve replacement in approximately 3 months.   3. AAA-Patient will require follow-up abdominal  ultrasound June 2019.  4. Lung nodule-noted on CT scan December 2017; patient will need follow-up chest CT December 2018.  5. Hypertension -blood pressure controlled. Continue present medications.   6. Hyperlipidemia-continue statin.    7. COPD-management per primary service.   8.  postoperative volume excess-continue gentle diuresis. Follow renal function.    Signed, Olga MillersBrian Crenshaw, MD  09/12/2016 1:51 PM

## 2016-09-12 NOTE — Plan of Care (Signed)
Problem: Cardiac: Goal: Hemodynamic stability will improve Outcome: Not Progressing Currently on Amiodarone drip.

## 2016-09-12 NOTE — Progress Notes (Signed)
CARDIAC REHAB PHASE I   PRE:  Rate/Rhythm: 99 SR  BP:  Supine: 127/64  Sitting:   Standing:    SaO2: 96% 2L  MODE:  Ambulation:  Sitting on side of bed  ft   POST:  Rate/Rhythm: 107 ST  BP:  Supine:   Sitting: 130/65  Standing:    SaO2: 77% 2L, 83% 4L  95% 6L 1325-1415 Came to see pt to walk. Sat pt on side of bed. Pt with some DOE sitting on side of bed. His sats went to 77% on 2L and it took 6L to get to 95%. Did not attempt to walk to bathroom with his low sats so I got BSC.  Encouraged pt to use purse lip breathing. Resp in to see pt but pt on BSC.  Assisted pt to recliner after using BSC and was able to decrease to 4L. Staff can try to walk later after pt gets treatment. Will follow up tomorrow. He remained in NSR.  Notified RN of having to increase to 6L with just sitting on side of bed.    Luetta Nuttingharlene Nael Petrosyan, RN BSN  09/12/2016 2:11 PM

## 2016-09-12 NOTE — Progress Notes (Addendum)
      [address]       [place] [id number]             [phone]     Called to see patient for increased SOB/DOE  Sats to 77 percent with walking , now on 6 liters in upper 90%  CXR is stable  ABG pending  PE  Lungs clear Cor: RRR Abd: benign Ext : some upper and lower ext edema Incis : healing well   A/P Acute increase in respiratory distress, mostly resolved with increase FIO2 to 6 liters,  D/w Dr Tyrone SageGerhardt who wants to have CTA PE protocol and Echo - will order I saw patient with [name] this afternoon . CT not done yet  Patient feels better now , resting comfortably  . To have cta /ro pe tonight    I have seen and examined [name] and agree with the above assessment  and plan.  Delight OvensEdward B Faron Tudisco MD Beeper [phone] Office [phone] 09/12/2016 6:57 PM

## 2016-09-12 NOTE — Progress Notes (Signed)
Patient found SOB in chair. C/o difficulty breathing. Sats in the low to mid 80s. Pale appearance. Oxygen increased, Xopenex prn given and respiratory called. Sats mid-90's on 8 liters. Rapid response called and MD notified.

## 2016-09-12 NOTE — Progress Notes (Signed)
[address]       [place] [id number]             [phone]      7 Days Post-Op Procedure(s) (LRB): AORTIC VALVE REPLACEMENT (AVR) USING 23 MM MAGNA EASE PERICARDIAL TISSUE VALVE (N/A) TRANSESOPHAGEAL ECHOCARDIOGRAM (TEE) (N/A) CORONARY ARTERY BYPASS GRAFTING (CABG) USING LIMA TO DISTAL LAD AND ENDOSCOPICALLY HARVESTED GREATER SAPHENOUS VEIN TO CIRC. (N/A) ENDOVEIN HARVEST OF GREATER SAPHENOUS VEIN (Right) Subjective: Aflutter with RVR, now in sinus tachy  Objective: Vital signs in last 24 hours: Temp:  [98.1 F (36.7 C)-99.2 F (37.3 C)] 98.2 F (36.8 C) (08/06 0431) Pulse Rate:  [98-170] 146 (08/06 0547) Cardiac Rhythm: Supraventricular tachycardia (08/06 0448) Resp:  [15-26] 20 (08/06 0547) BP: (99-154)/(60-132) 116/76 (08/06 0448) SpO2:  [88 %-98 %] 96 % (08/06 0749) FiO2 (%):  [0 %-32 %] 32 % (08/06 0749) Weight:  [194 lb 1.6 oz (88 kg)] 194 lb 1.6 oz (88 kg) (08/06 0431)  Hemodynamic parameters for last 24 hours:    Intake/Output from previous day: 08/05 0701 - 08/06 0700 In: 630 [P.O.:630] Out: 2400 [Urine:2400] Intake/Output this shift: Total I/O In: -  Out: 400 [Urine:400]  Alert, NAD Lungs: min dim in the bases Cor: slightly irregular Abd: benign Ext: min edema Incis : healing well  Lab Results:  Recent Labs  09/11/16 0410 09/12/16 0435  WBC 9.5 11.9*  HGB 8.4* 8.8*  HCT 25.4* 27.5*  PLT 173 270   BMET:  Recent Labs  09/11/16 0410 09/12/16 0435  NA 134* 136  K 4.0 4.4  CL 94* 96*  CO2 35* 32  GLUCOSE 119* 133*  BUN 16 15  CREATININE 0.51* 0.57*  CALCIUM 8.6* 8.9    PT/INR: No results for input(s): LABPROT, INR in the last 72 hours. ABG    Component Value Date/Time   PHART 7.287 (L) 09/05/2016 2325   HCO3 23.3 09/05/2016 2325   TCO2 24 09/06/2016 1639   ACIDBASEDEF 3.0 (H) 09/05/2016 2325   O2SAT 66.4 09/09/2016 0540   CBG (last 3)  No results for input(s): GLUCAP in the last 72  hours.  Meds Scheduled Meds: . aspirin EC  325 mg Oral Daily   Or  . aspirin  324 mg Per Tube Daily  . atorvastatin  40 mg Oral Daily  . bisacodyl  10 mg Oral Daily   Or  . bisacodyl  10 mg Rectal Daily  . Chlorhexidine Gluconate Cloth  6 each Topical Daily  . docusate sodium  200 mg Oral Daily  . enoxaparin (LOVENOX) injection  40 mg Subcutaneous Q24H  . furosemide  40 mg Oral BID  . levalbuterol  0.63 mg Nebulization Q6H  . loratadine  10 mg Oral Daily  . mouth rinse  15 mL Mouth Rinse BID  . mometasone-formoterol  2 puff Inhalation BID  . pantoprazole  40 mg Oral Daily  . potassium chloride  40 mEq Oral BID  . sodium chloride flush  10-40 mL Intracatheter Q12H  . sodium chloride flush  3 mL Intravenous Q12H  . tamsulosin  0.4 mg Oral QPC supper  . tiotropium  18 mcg Inhalation Daily   Continuous Infusions: . sodium chloride    . amiodarone    . amiodarone 60 mg/hr (09/12/16 0514)   Followed by  . amiodarone     PRN Meds:.sodium chloride, alum & mag hydroxide-simeth, fluticasone, guaiFENesin-dextromethorphan, levalbuterol, magnesium hydroxide, metoprolol tartrate, ondansetron (ZOFRAN) IV, oxyCODONE, sodium chloride flush,  sodium chloride flush, traMADol, zolpidem  Xrays Dg Chest Port 1 View  Result Date: 09/11/2016 CLINICAL DATA:  Sore chest after CABG EXAM: PORTABLE CHEST 1 VIEW COMPARISON:  Yesterday FINDINGS: Haziness of the lower chest from atelectasis and pleural fluid. No pulmonary edema. Stable heart size post CABG and aortic valve replacement. Right upper extremity PICC with tip at the SVC level. No pneumothorax. IMPRESSION: Small pleural effusions and mild atelectasis. Electronically Signed   By: Marnee Spring M.D.   On: 09/11/2016 07:20    Assessment/Plan: S/P Procedure(s) (LRB): AORTIC VALVE REPLACEMENT (AVR) USING 23 MM MAGNA EASE PERICARDIAL TISSUE VALVE (N/A) TRANSESOPHAGEAL ECHOCARDIOGRAM (TEE) (N/A) CORONARY ARTERY BYPASS GRAFTING (CABG) USING LIMA TO  DISTAL LAD AND ENDOSCOPICALLY HARVESTED GREATER SAPHENOUS VEIN TO CIRC. (N/A) ENDOVEIN HARVEST OF GREATER SAPHENOUS VEIN (Right)  1 aflutter- now on amio gtt, have asked cardiology to assist with management. Add low dose beta blocker 2 K+ 4.4- monitor, repeat magnesium level 3 check TSH 4 reasonable sugar control, needs dietary management of CHO intake 5 wean O2, pulm toilet/nebs 6 voiding trial today 7 routine rehab  LOS: 7 days    GOLD,WAYNE E 09/12/2016

## 2016-09-12 NOTE — Significant Event (Signed)
Rapid Response Event Note  Overview:  Called to assist with patient with increased SOB Time Called: 1545 Event Type: Respiratory  Initial Focused Assessment:  Sitting in chair - warm and dry - oriented - getting nebulizer treatment - able to speak full sentences - says he got SOB when getting up with PT earlier - sats dropped and required increase in O2 to 4 liters to recover.  Had second episode currently - states it just hits all of a sudden.  Bil BS very distant.  Moderate WOB.  O2 at 6 liters nasal cannula - sats now 93%.  157/77 HR ST 103 RR 24.     Interventions:  Support to patient and staff.  PCXR done and ABG per order MD.  Patient reports he is almost back to his COPD baseline.  Will monitor - to all as needed.    Plan of Care (if not transferred):  Event Summary: Name of Physician Notified: Gershon CraneWayne Gold PA and Dr. Tyrone SageGerhardt per 4E staff at  (pta RRT)    at    Outcome: Stayed in room and stabalized  Event End Time: 1635  Delton PrairieBritt, Kail Fraley L

## 2016-09-12 NOTE — Progress Notes (Signed)
Bladder scan completed after pt. Voided 250 ml. Zero residual shown on scan. [name] 09/12/2016

## 2016-09-12 NOTE — Progress Notes (Signed)
Patient got up by NT to be weighed and started difficulty breathing and HR increased to 170's and 180's. Metoprolol IV given as ordered without any result. Dr Dorris FetchHendrickson paged and order for Amiodarone bolus and continuous infusion ordered. Bp stable. Nurse will continue to monitor.

## 2016-09-12 NOTE — Progress Notes (Signed)
      [address]       [place] [id number]             [phone]       Came to floor found patient in a flutter at rate of 150, had amiodarone drip going  bp stable . Rapid a pacing converted patient to sinus 90-100 Continue iv amiodrone  and betablocker and monitor for now Foley in for urinary retention several days ago , try to remove today

## 2016-09-12 NOTE — Progress Notes (Signed)
Pt. Weaned to 2L of O2 and tolerating with a spO2 of 95 [name] A [name] 09/12/2016 12:14 PM

## 2016-09-13 ENCOUNTER — Encounter (HOSPITAL_COMMUNITY): Payer: Self-pay | Admitting: Pulmonary Disease

## 2016-09-13 DIAGNOSIS — J9601 Acute respiratory failure with hypoxia: Secondary | ICD-10-CM

## 2016-09-13 DIAGNOSIS — J449 Chronic obstructive pulmonary disease, unspecified: Secondary | ICD-10-CM

## 2016-09-13 DIAGNOSIS — J90 Pleural effusion, not elsewhere classified: Secondary | ICD-10-CM

## 2016-09-13 DIAGNOSIS — R0602 Shortness of breath: Secondary | ICD-10-CM

## 2016-09-13 LAB — BASIC METABOLIC PANEL
Anion gap: 9 (ref 5–15)
BUN: 14 mg/dL (ref 6–20)
CO2: 30 mmol/L (ref 22–32)
Calcium: 8.7 mg/dL — ABNORMAL LOW (ref 8.9–10.3)
Chloride: 94 mmol/L — ABNORMAL LOW (ref 101–111)
Creatinine, Ser: 0.49 mg/dL — ABNORMAL LOW (ref 0.61–1.24)
GFR calc Af Amer: >60 mL/min (ref >60)
GFR calc non Af Amer: >60 mL/min (ref >60)
Glucose, Bld: 113 mg/dL — ABNORMAL HIGH (ref 65–99)
Potassium: 4.5 mmol/L (ref 3.5–5.1)
Sodium: 133 mmol/L — ABNORMAL LOW (ref 135–145)

## 2016-09-13 LAB — CBC
HCT: 25.8 % — ABNORMAL LOW (ref 39.0–52.0)
Hemoglobin: 8.3 g/dL — ABNORMAL LOW (ref 13.0–17.0)
MCH: 29.5 pg (ref 26.0–34.0)
MCHC: 32.2 g/dL (ref 30.0–36.0)
MCV: 91.8 fL (ref 78.0–100.0)
Platelets: 293 10*3/uL (ref 150–400)
RBC: 2.81 MIL/uL — ABNORMAL LOW (ref 4.22–5.81)
RDW: 13.9 % (ref 11.5–15.5)
WBC: 13.3 10*3/uL — ABNORMAL HIGH (ref 4.0–10.5)

## 2016-09-13 LAB — URINALYSIS, COMPLETE (UACMP) WITH MICROSCOPIC
Bacteria, UA: NONE SEEN
Bilirubin Urine: NEGATIVE
Glucose, UA: NEGATIVE mg/dL
Ketones, ur: NEGATIVE mg/dL
Nitrite: NEGATIVE
Protein, ur: 100 mg/dL — AB
Specific Gravity, Urine: 1.015 (ref 1.005–1.030)
pH: 7 (ref 5.0–8.0)

## 2016-09-13 MED ORDER — BUDESONIDE 0.5 MG/2ML IN SUSP
0.5000 mg | Freq: Two times a day (BID) | RESPIRATORY_TRACT | Status: DC
  Administered 2016-09-13 – 2016-09-20 (×14): 0.5 mg via RESPIRATORY_TRACT
  Filled 2016-09-13 (×15): qty 2

## 2016-09-13 MED ORDER — IPRATROPIUM BROMIDE 0.02 % IN SOLN
0.5000 mg | Freq: Four times a day (QID) | RESPIRATORY_TRACT | Status: DC
  Administered 2016-09-13 (×2): 0.5 mg via RESPIRATORY_TRACT
  Filled 2016-09-13 (×2): qty 2.5

## 2016-09-13 MED ORDER — DEXTROSE 5 % IV SOLN
1.0000 g | INTRAVENOUS | Status: DC
  Administered 2016-09-13 – 2016-09-19 (×7): 1 g via INTRAVENOUS
  Filled 2016-09-13 (×8): qty 10

## 2016-09-13 MED ORDER — ARFORMOTEROL TARTRATE 15 MCG/2ML IN NEBU
15.0000 ug | INHALATION_SOLUTION | Freq: Two times a day (BID) | RESPIRATORY_TRACT | Status: DC
  Administered 2016-09-13 – 2016-09-20 (×14): 15 ug via RESPIRATORY_TRACT
  Filled 2016-09-13 (×14): qty 2

## 2016-09-13 MED ORDER — ACETAMINOPHEN 325 MG PO TABS
650.0000 mg | ORAL_TABLET | Freq: Four times a day (QID) | ORAL | Status: DC | PRN
  Administered 2016-09-13 – 2016-09-16 (×3): 650 mg via ORAL
  Filled 2016-09-13 (×3): qty 2

## 2016-09-13 MED ORDER — IPRATROPIUM BROMIDE 0.02 % IN SOLN
0.5000 mg | Freq: Three times a day (TID) | RESPIRATORY_TRACT | Status: DC
  Administered 2016-09-14 – 2016-09-16 (×7): 0.5 mg via RESPIRATORY_TRACT
  Filled 2016-09-13 (×7): qty 2.5

## 2016-09-13 MED ORDER — AMIODARONE HCL 200 MG PO TABS
200.0000 mg | ORAL_TABLET | Freq: Two times a day (BID) | ORAL | Status: DC
  Administered 2016-09-13 – 2016-09-20 (×15): 200 mg via ORAL
  Filled 2016-09-13 (×15): qty 1

## 2016-09-13 MED ORDER — LEVALBUTEROL HCL 0.63 MG/3ML IN NEBU
0.6300 mg | INHALATION_SOLUTION | Freq: Three times a day (TID) | RESPIRATORY_TRACT | Status: DC
  Administered 2016-09-13 (×2): 0.63 mg via RESPIRATORY_TRACT
  Filled 2016-09-13 (×2): qty 3

## 2016-09-13 NOTE — Progress Notes (Signed)
Ceftriaxone for UTI per pharmacy ordered.  Ceftriaxone does not need renal adjustment.  [employer] policy allows pharmacy to change the ordered dose based on indication without contacting the provider, therefore a consult is not required.   Plan: -ceftriaxone 1 g IV q24h -pharmacy to sign off as no adjustment needed  [name], [name] 09/13/2016 5:55 PM

## 2016-09-13 NOTE — Progress Notes (Addendum)
Patient has periods of anxiousness, need something for anxiety before attempting to ambulate in hallway; Notified PA, Asa LenteConte for anxiety medication. PA promptly returned called. Do not want to compromise respiratory status with anxiety medication for now.

## 2016-09-13 NOTE — Progress Notes (Addendum)
[address]       [place] [address]             [phone]      8 Days Post-Op Procedure(s) (LRB): AORTIC VALVE REPLACEMENT (AVR) USING 23 MM MAGNA EASE PERICARDIAL TISSUE VALVE (N/A) TRANSESOPHAGEAL ECHOCARDIOGRAM (TEE) (N/A) CORONARY ARTERY BYPASS GRAFTING (CABG) USING LIMA TO DISTAL LAD AND ENDOSCOPICALLY HARVESTED GREATER SAPHENOUS VEIN TO CIRC. (N/A) ENDOVEIN HARVEST OF GREATER SAPHENOUS VEIN (Right) Subjective: Some SOB persists, sats ok on 4 liters  Objective: Vital signs in last 24 hours: Temp:  [98.6 F (37 C)-100.1 F (37.8 C)] 100.1 F (37.8 C) (08/07 0400) Pulse Rate:  [81-97] 97 (08/07 0400) Cardiac Rhythm: Normal sinus rhythm (08/06 1900) Resp:  [16-22] 19 (08/07 0400) BP: (108-131)/(62-80) 128/71 (08/07 0400) SpO2:  [93 %-99 %] 99 % (08/07 0400) FiO2 (%):  [32 %] 32 % (08/06 1422) Weight:  [195 lb 12.8 oz (88.8 kg)] 195 lb 12.8 oz (88.8 kg) (08/07 0400)  Hemodynamic parameters for last 24 hours:    Intake/Output from previous day: 08/06 0701 - 08/07 0700 In: 1090 [P.O.:1080; I.V.:10] Out: 2170 [Urine:2170] Intake/Output this shift: No intake/output data recorded.  General appearance: alert, cooperative and no distress Heart: regular rate and rhythm Lungs: mild coarseness with some exp prolongation Abdomen: mild distension, nontender Extremities: min edema Wound: incis healing well  Lab Results:  Recent Labs  09/12/16 0435 09/13/16 0441  WBC 11.9* 13.3*  HGB 8.8* 8.3*  HCT 27.5* 25.8*  PLT 270 293   BMET:  Recent Labs  09/12/16 0435 09/13/16 0441  NA 136 133*  K 4.4 4.5  CL 96* 94*  CO2 32 30  GLUCOSE 133* 113*  BUN 15 14  CREATININE 0.57* 0.49*  CALCIUM 8.9 8.7*    PT/INR: No results for input(s): LABPROT, INR in the last 72 hours. ABG    Component Value Date/Time   PHART 7.448 09/12/2016 1610   HCO3 31.4 (H) 09/12/2016 1610   TCO2 24 09/06/2016 1639   ACIDBASEDEF 3.0 (H) 09/05/2016 2325   O2SAT  91.5 09/12/2016 1610   CBG (last 3)  No results for input(s): GLUCAP in the last 72 hours.  Meds Scheduled Meds: . aspirin EC  325 mg Oral Daily   Or  . aspirin  324 mg Per Tube Daily  . atorvastatin  40 mg Oral Daily  . bisacodyl  10 mg Oral Daily   Or  . bisacodyl  10 mg Rectal Daily  . Chlorhexidine Gluconate Cloth  6 each Topical Daily  . docusate sodium  200 mg Oral Daily  . enoxaparin (LOVENOX) injection  40 mg Subcutaneous Q24H  . furosemide  40 mg Oral BID  . levalbuterol  0.63 mg Nebulization TID  . loratadine  10 mg Oral Daily  . mouth rinse  15 mL Mouth Rinse BID  . metoprolol tartrate  12.5 mg Oral BID  . mometasone-formoterol  2 puff Inhalation BID  . pantoprazole  40 mg Oral Daily  . potassium chloride  40 mEq Oral BID  . sodium chloride flush  10-40 mL Intracatheter Q12H  . sodium chloride flush  3 mL Intravenous Q12H  . tamsulosin  0.4 mg Oral QPC supper  . tiotropium  18 mcg Inhalation Daily   Continuous Infusions: . sodium chloride    . amiodarone 30 mg/hr (09/12/16 2159)   PRN Meds:.sodium chloride, alum & mag hydroxide-simeth, fluticasone, guaiFENesin-dextromethorphan, levalbuterol, magnesium hydroxide, metoprolol tartrate, ondansetron (ZOFRAN) IV, oxyCODONE,  sodium chloride flush, sodium chloride flush, traMADol, zolpidem  Xrays Ct Angio Chest Pe W Or Wo Contrast  Result Date: 09/12/2016 CLINICAL DATA:  Shortness of breath. Status post aortic valve replacement 1 week ago. History of COPD. EXAM: CT ANGIOGRAPHY CHEST WITH CONTRAST TECHNIQUE: Multidetector CT imaging of the chest was performed using the standard protocol during bolus administration of intravenous contrast. Multiplanar CT image reconstructions and MIPs were obtained to evaluate the vascular anatomy. CONTRAST:  80 cc Isovue 370 COMPARISON:  Chest radiograph September 12, 2016 at 1617 hours and CT chest January 12, 2015 FINDINGS: CARDIOVASCULAR: Adequate contrast opacification of the pulmonary  artery's. Main pulmonary artery is not enlarged. No pulmonary arterial filling defects to the level of the subsegmental branches. Heart size is mildly enlarged, status post aortic valve replacement. Trace pericardial effusion. Thoracic aorta is normal course and caliber, mild atherosclerosis. MEDIASTINUM/NODES: 12 mm short access presumably reactive subcarinal lymph node in the setting of recent surgery. Small amount of postoperative anterior mediastinal hematoma without focal fluid collection. Minimal pneumomediastinum. Severe centrilobular emphysema. LUNGS/PLEURA: Tracheobronchial tree is patent, no pneumothorax. Small bilateral pleural effusions. Bibasilar atelectasis. UPPER ABDOMEN: Nonacute.  Partially imaged LEFT parapelvic cyst. MUSCULOSKELETAL: Status post recent median sternotomy. RIGHT PICC distal tip in mid superior vena cava. Review of the MIP images confirms the above findings. IMPRESSION: 1. No acute pulmonary embolism. 2. Status post recent aortic valve replacement. Expected postoperative change. 3. Severe centrilobular emphysema.  Small pleural effusions. Aortic Atherosclerosis (ICD10-I70.0) and Emphysema (ICD10-J43.9). Electronically Signed   By: Awilda Metroourtnay  Bloomer M.D.   On: 09/12/2016 19:59   Dg Chest Port 1 View  Result Date: 09/12/2016 CLINICAL DATA:  Respiratory distress EXAM: PORTABLE CHEST 1 VIEW COMPARISON:  09/12/2016 FINDINGS: Cardiac shadow is stable. Postsurgical changes are again seen. Right-sided PICC line is again noted and stable. The lungs remain hyperinflated. No sizable effusion is seen. No pneumothorax is noted. IMPRESSION: COPD stable from the prior exam. No other focal abnormality is noted. Electronically Signed   By: Alcide CleverMark  Lukens M.D.   On: 09/12/2016 16:35   Dg Chest Port 1 View  Result Date: 09/12/2016 CLINICAL DATA:  Short of breath EXAM: PORTABLE CHEST 1 VIEW COMPARISON:  09/11/2016 FINDINGS: Sternotomy wires overlie enlarged cardiac silhouette. RIGHT PICC line  remains. No effusion, infiltrate pneumothorax. Interval improvement of bilateral pleural effusions. Lungs are hyperinflated IMPRESSION: Improvement in bibasilar effusions.  Lungs are hyperinflated Electronically Signed   By: Genevive BiStewart  Edmunds M.D.   On: 09/12/2016 08:53    Assessment/Plan: S/P Procedure(s) (LRB): AORTIC VALVE REPLACEMENT (AVR) USING 23 MM MAGNA EASE PERICARDIAL TISSUE VALVE (N/A) TRANSESOPHAGEAL ECHOCARDIOGRAM (TEE) (N/A) CORONARY ARTERY BYPASS GRAFTING (CABG) USING LIMA TO DISTAL LAD AND ENDOSCOPICALLY HARVESTED GREATER SAPHENOUS VEIN TO CIRC. (N/A) ENDOVEIN HARVEST OF GREATER SAPHENOUS VEIN (Right)   1 stable, with CT results noted, no PE. Severe COPD/centrolobular emphysema. Cont pulm RX/IS/wean O2 as able. Pooe diffusion capacity and FEV 1 on PFT's- will ask pulm to see as well 2 sinus rhythm/Tach- I am concerned that amiodarone is somewhat high risk for pulm toxicity but no significant evidence currently on CXR/CT - will see if cardiol thinks another agent is accept. Change to po now. 3 cont routine rehab 4 low grade temp, mild leukocytosis, monitor - could have some early bronchitis 5 TSH normal 6 ABL anemia is pretty stable- monitor   LOS: 8 days    GOLD,WAYNE E 09/13/2016  Feels better today, will need to monitor amni drone with poor under lying pulmonary function.  Echo ordered yesterday not done yet Ct scan reviewed . I have seen and examined [name] and agree with the above assessment  and plan.  Delight Ovens MD Beeper [phone] Office [phone] 09/13/2016 2:32 PM

## 2016-09-13 NOTE — Progress Notes (Signed)
Temp 101.3, notified PA, Tess, new ordered written

## 2016-09-13 NOTE — Progress Notes (Signed)
Progress Note  Patient Name: [name] Date of Encounter: 09/13/2016  Primary Cardiologist: Dr Purvis Sheffield  Subjective   Pt remains dyspneic but less severe compared to yesterday; no chest pain  Inpatient Medications    Scheduled Meds: . amiodarone  200 mg Oral BID  . aspirin EC  325 mg Oral Daily   Or  . aspirin  324 mg Per Tube Daily  . atorvastatin  40 mg Oral Daily  . bisacodyl  10 mg Oral Daily   Or  . bisacodyl  10 mg Rectal Daily  . Chlorhexidine Gluconate Cloth  6 each Topical Daily  . docusate sodium  200 mg Oral Daily  . enoxaparin (LOVENOX) injection  40 mg Subcutaneous Q24H  . furosemide  40 mg Oral BID  . levalbuterol  0.63 mg Nebulization TID  . loratadine  10 mg Oral Daily  . mouth rinse  15 mL Mouth Rinse BID  . metoprolol tartrate  12.5 mg Oral BID  . mometasone-formoterol  2 puff Inhalation BID  . pantoprazole  40 mg Oral Daily  . potassium chloride  40 mEq Oral BID  . sodium chloride flush  10-40 mL Intracatheter Q12H  . sodium chloride flush  3 mL Intravenous Q12H  . tamsulosin  0.4 mg Oral QPC supper  . tiotropium  18 mcg Inhalation Daily   Continuous Infusions: . sodium chloride     PRN Meds: sodium chloride, alum & mag hydroxide-simeth, fluticasone, guaiFENesin-dextromethorphan, levalbuterol, magnesium hydroxide, metoprolol tartrate, ondansetron (ZOFRAN) IV, oxyCODONE, sodium chloride flush, sodium chloride flush, traMADol, zolpidem   Vital Signs    Vitals:   09/12/16 2200 09/13/16 0000 09/13/16 0400 09/13/16 0932  BP: 131/80 116/74 128/71   Pulse: 92 81 97   Resp: 20 16 19    Temp:  98.6 F (37 C) 100.1 F (37.8 C)   TempSrc:  Oral Oral   SpO2:  98% 99% 94%  Weight:   88.8 kg (195 lb 12.8 oz)   Height:        Intake/Output Summary (Last 24 hours) at 09/13/16 1033 Last data filed at 09/13/16 0700  Gross per 24 hour  Intake              850 ml  Output             1770 ml  Net             -920 ml   Filed Weights   09/11/16 0418 09/12/16 0431 09/13/16 0400  Weight: 88.6 kg (195 lb 5.2 oz) 88 kg (194 lb 1.6 oz) 88.8 kg (195 lb 12.8 oz)    Telemetry    Sinus to sinus tach - Personally Reviewed  Physical Exam   GEN: WD/WN, mildly dyspneic   Neck: supple Cardiac: RRR Respiratory: Diminished BS throughout GI: Soft, nontender, non-distended  MS: trace edema Neuro:  Nonfocal  Psych: Normal affect   Labs    Chemistry Recent Labs Lab 09/08/16 0817  09/09/16 0545  09/11/16 0410 09/12/16 0435 09/13/16 0441  NA 136  < > 138  < > 134* 136 133*  K 4.8  < > 3.9  < > 4.0 4.4 4.5  CL 102  < > 99*  < > 94* 96* 94*  CO2 28  < > 33*  < > 35* 32 30  GLUCOSE 125*  < > 117*  < > 119* 133* 113*  BUN 20  < > 14  < > 16 15 14   CREATININE 0.68  < >  0.55*  < > 0.51* 0.57* 0.49*  CALCIUM 8.3*  < > 8.2*  < > 8.6* 8.9 8.7*  PROT 5.1*  --  5.0*  --   --   --   --   ALBUMIN 3.0*  --  2.8*  --   --   --   --   AST 24  --  20  --   --   --   --   ALT 13*  --  14*  --   --   --   --   ALKPHOS 34*  --  36*  --   --   --   --   BILITOT 0.9  --  1.0  --   --   --   --   GFRNONAA >60  < > >60  < > >60 >60 >60  GFRAA >60  < > >60  < > >60 >60 >60  ANIONGAP 6  < > 6  < > 5 8 9   < > = values in this interval not displayed.   Hematology Recent Labs Lab 09/11/16 0410 09/12/16 0435 09/13/16 0441  WBC 9.5 11.9* 13.3*  RBC 2.81* 3.02* 2.81*  HGB 8.4* 8.8* 8.3*  HCT 25.4* 27.5* 25.8*  MCV 90.4 91.1 91.8  MCH 29.9 29.1 29.5  MCHC 33.1 32.0 32.2  RDW 13.8 13.7 13.9  PLT 173 270 293     Radiology    Ct Angio Chest Pe W Or Wo Contrast  Result Date: 09/12/2016 CLINICAL DATA:  Shortness of breath. Status post aortic valve replacement 1 week ago. History of COPD. EXAM: CT ANGIOGRAPHY CHEST WITH CONTRAST TECHNIQUE: Multidetector CT imaging of the chest was performed using the standard protocol during bolus administration of intravenous contrast. Multiplanar CT image reconstructions and MIPs were obtained to  evaluate the vascular anatomy. CONTRAST:  80 cc Isovue 370 COMPARISON:  Chest radiograph September 12, 2016 at 1617 hours and CT chest January 12, 2015 FINDINGS: CARDIOVASCULAR: Adequate contrast opacification of the pulmonary artery's. Main pulmonary artery is not enlarged. No pulmonary arterial filling defects to the level of the subsegmental branches. Heart size is mildly enlarged, status post aortic valve replacement. Trace pericardial effusion. Thoracic aorta is normal course and caliber, mild atherosclerosis. MEDIASTINUM/NODES: 12 mm short access presumably reactive subcarinal lymph node in the setting of recent surgery. Small amount of postoperative anterior mediastinal hematoma without focal fluid collection. Minimal pneumomediastinum. Severe centrilobular emphysema. LUNGS/PLEURA: Tracheobronchial tree is patent, no pneumothorax. Small bilateral pleural effusions. Bibasilar atelectasis. UPPER ABDOMEN: Nonacute.  Partially imaged LEFT parapelvic cyst. MUSCULOSKELETAL: Status post recent median sternotomy. RIGHT PICC distal tip in mid superior vena cava. Review of the MIP images confirms the above findings. IMPRESSION: 1. No acute pulmonary embolism. 2. Status post recent aortic valve replacement. Expected postoperative change. 3. Severe centrilobular emphysema.  Small pleural effusions. Aortic Atherosclerosis (ICD10-I70.0) and Emphysema (ICD10-J43.9). Electronically Signed   By: Awilda Metroourtnay  Bloomer M.D.   On: 09/12/2016 19:59   Dg Chest Port 1 View  Result Date: 09/12/2016 CLINICAL DATA:  Respiratory distress EXAM: PORTABLE CHEST 1 VIEW COMPARISON:  09/12/2016 FINDINGS: Cardiac shadow is stable. Postsurgical changes are again seen. Right-sided PICC line is again noted and stable. The lungs remain hyperinflated. No sizable effusion is seen. No pneumothorax is noted. IMPRESSION: COPD stable from the prior exam. No other focal abnormality is noted. Electronically Signed   By: Alcide CleverMark  Lukens M.D.   On: 09/12/2016  16:35   [facility]  Result Date: 09/12/2016 CLINICAL DATA:  Short of breath EXAM: PORTABLE CHEST 1 VIEW COMPARISON:  09/11/2016 FINDINGS: Sternotomy wires overlie enlarged cardiac silhouette. RIGHT PICC line remains. No effusion, infiltrate pneumothorax. Interval improvement of bilateral pleural effusions. Lungs are hyperinflated IMPRESSION: Improvement in bibasilar effusions.  Lungs are hyperinflated Electronically Signed   By: Genevive Bi M.D.   On: 09/12/2016 08:53    Patient Profile     [name] is a 66 y.o. male with a hx of Hypertension, hyperlipidemia, abdominal aortic aneurysm, COPD and now status post recent (09/06/16) aortic valve replacement as well as coronary artery bypass and graft who is being seen for the evaluation of atrial flutter.  Assessment & Plan    1. Atrial flutter-Remains in sinus today. Likely postoperative from recent coronary artery bypass graft/aortic valve replacement. Agree with changing amiodarone 200 mg twice a day for 2 weeks and then 200 mg daily thereafter. Would continue for 8-12 weeks and if patient holds sinus rhythm discontinue at that time. I agree that amiodarone would not be a good long-term choice in a patient this age and severity of lung disease. However I think it would be reasonable to treat short-term for postoperative atrial arrhythmias. CHADSvasc 2. However I think this was likely postoperative atrial arrhythmia and therefore does not require long-term anticoagulation. DC metoprolol as could be contributing to bronchospasm.  2. Dyspnea-patient remains dyspneic this morning but improved compared to yesterday. CTA shows no pulmonary embolus. There is severe COPD. His pulmonary status is likely exacerbated by postoperative volume excess. Continue diuresis. Continue pulmonary toilet. I agree with pulmonary consult.   3. S/p AVR/CABG-plan to continue aspirin and statin. Patient will require baseline echocardiogram status post  aortic valve replacement in approximately 3 months.  4. AAA-Patient will require follow-up abdominal ultrasound June 2019.  5. Lung nodule-noted on CT scan December 2017; patient will need follow-up chest CT December 2018.  6. Hypertension -blood pressure controlled. Continue present medications.  7. Hyperlipidemia-continue statin.   8. COPD-Pulmonary consult pending.  Signed, Olga Millers, MD  09/13/2016, 10:33 AM

## 2016-09-13 NOTE — Consult Note (Signed)
Name: [name] MRN: [id number] DOB: Mar 10, 1950    ADMISSION DATE:  09/05/2016 CONSULTATION DATE:  09/13/16  REFERRING MD :  Tyrone Sage  CHIEF COMPLAINT:  COPD management   HISTORY OF PRESENT ILLNESS:  [name] is a 66 y.o. male with a PMH as outlined below who recently underwent AVR and CABG x 2 on 09/06/16.  He had done well post operatively with the exception of developing A.flutter with RVR on 8/6.  Cardiology was consulted, he was paced out back to NSR and was also started on amiodarone with successful maintenance of NSR.  That same day, he developed dyspnea which he felt was due to COPD.  He had CTA obtained which was negative for PE but did demonstrate centrilobular emphysema with small pleural effusions.  On 8/7, PCCM was asked to see in consultation for ongoing COPD management. He had PFT's 7/26 which were as follows:  FVC 68% pred, FEV1 1.08 (32% pred), ratio 35 (47% pred).  DLCO 40% (55% when correct for alveolar volume).  No significant change after BD's. He has roughly a 40 pack year smoking history and quit roughly 14 years ago.  He has not seen or does not follow with a pulmonologist.  His outpatient medication list includes advair, albuterol PRN, spiriva.  He used advair and spiriva daily and albuterol only maybe every other day or two.  PAST MEDICAL HISTORY :   has a past medical history of AAA (abdominal aortic aneurysm) (HCC); COPD (chronic obstructive pulmonary disease) (HCC); Fatty liver; Hyperlipidemia; Hypertension; Severe aortic stenosis (08/29/2016); and White coat hypertension.  has a past surgical history that includes Appendectomy; Colonoscopy (06/22/2010); Ankle surgery (Right, 2009); Colonoscopy with propofol (N/A, 05/15/2014); polypectomy (N/A, 05/15/2014); Cardiac catheterization (N/A, 02/02/2016); RIGHT/LEFT HEART CATH AND CORONARY ANGIOGRAPHY (N/A, 08/29/2016); Aortic valve replacement (N/A, 09/05/2016); TEE without cardioversion (N/A, 09/05/2016); Coronary  artery bypass graft (N/A, 09/05/2016); and Endoharvest vein of greater saphenous vein (Right, 09/05/2016). Prior to Admission medications   Medication Sig Start Date End Date Taking? Authorizing Provider  acetaminophen (TYLENOL) 500 MG tablet Take 1,000 mg by mouth every 6 (six) hours as needed for mild pain, moderate pain or headache.   Yes [provider]  ADVAIR DISKUS 100-50 MCG/DOSE AEPB INHALE 1 DOSE BY MOUTH TWICE DAILY 06/13/16  Yes [name], [name] A, MD  albuterol (VENTOLIN HFA) 108 (90 Base) MCG/ACT inhaler INHALE TWO PUFFS BY MOUTH EVERY 4 HOURS AS NEEDED Patient taking differently: Inhale 1 puff into the lungs every 4 (four) hours as needed for shortness of breath. INHALE TWO PUFFS BY MOUTH EVERY 4 HOURS AS NEEDED 12/03/15  Yes [name], MD  aspirin 81 MG tablet Take 81 mg by mouth at bedtime.    Yes [provider]  atorvastatin (LIPITOR) 40 MG tablet Take 1 tablet (40 mg total) by mouth daily. 06/02/16  Yes [name], MD  clopidogrel (PLAVIX) 75 MG tablet Take 1 tablet (75 mg total) by mouth daily with breakfast. 06/02/16  Yes [name], [name] A, MD  fexofenadine (ALLEGRA) 180 MG tablet Take 180 mg by mouth daily.   Yes [provider]  lisinopril-hydrochlorothiazide (ZESTORETIC) 20-12.5 MG tablet Take 1 tablet by mouth daily. 04/28/16  Yes [name], MD  metoprolol tartrate (LOPRESSOR) 25 MG tablet Take 0.5 tablets (12.5 mg total) by mouth 2 (two) times daily. 06/02/16  Yes [name], Jonna Coup, MD  mometasone (NASONEX) 50 MCG/ACT nasal spray Place 2 sprays into the nose daily as needed (for allergies). Patient  taking differently: Place 1 spray into the nose daily as needed (for allergies).  06/02/16  Yes [name], MD  Multiple Vitamin (MULTIVITAMIN) tablet Take 1 tablet by mouth daily.   Yes [provider]  nitroGLYCERIN (NITROSTAT) 0.4 MG SL tablet Place 1 tablet (0.4 mg total) under the tongue every 5 (five) minutes x 3 doses as needed for  chest pain. 02/05/16  Yes Rinaldo Cloud, MD  tiotropium (SPIRIVA HANDIHALER) 18 MCG inhalation capsule Place 1 capsule (18 mcg total) into inhaler and inhale daily. 06/02/16  Yes [name], MD  vitamin C (ASCORBIC ACID) 500 MG tablet Take 500 mg by mouth daily. Reported on 07/16/2015   Yes [provider]  tetrahydrozoline 0.05 % ophthalmic solution Place 2 drops into both eyes daily as needed (dry, red eye).    [provider]   No Known Allergies  FAMILY HISTORY:  family history includes AAA (abdominal aortic aneurysm) in his brother; Hypertension in his sister. SOCIAL HISTORY:  reports that he quit smoking about 14 years ago. His smoking use included Cigarettes. He has a 30.00 pack-year smoking history. He has never used smokeless tobacco. He reports that he drinks about 3.0 oz of alcohol per week . He reports that he does not use drugs.  REVIEW OF SYSTEMS:   All negative; except for those that are bolded, which indicate positives.  Constitutional: weight loss, weight gain, night sweats, fevers, chills, fatigue, weakness.  HEENT: headaches, sore throat, sneezing, nasal congestion, post nasal drip, difficulty swallowing, tooth/dental problems, visual complaints, visual changes, ear aches. Neuro: difficulty with speech, weakness, numbness, ataxia. CV:  chest pain, orthopnea, PND, swelling in lower extremities, dizziness, palpitations, syncope.  Resp: cough, hemoptysis, dyspnea, wheezing. GI: heartburn, indigestion, abdominal pain, nausea, vomiting, diarrhea, constipation, change in bowel habits, loss of appetite, hematemesis, melena, hematochezia.  GU: dysuria, change in color of urine, urgency or frequency, flank pain, hematuria. MSK: joint pain or swelling, decreased range of motion. Psych: change in mood or affect, depression, anxiety, suicidal ideations, homicidal ideations. Skin: rash, itching, bruising.    SUBJECTIVE:  Has been dyspneic since yesterday with  mild NP cough.  Also slightly anxious.  VITAL SIGNS: Temp:  [98.6 F (37 C)-100.1 F (37.8 C)] 100.1 F (37.8 C) (08/07 0400) Pulse Rate:  [81-97] 97 (08/07 0400) Resp:  [16-22] 19 (08/07 0400) BP: (108-131)/(62-80) 128/71 (08/07 0400) SpO2:  [93 %-99 %] 94 % (08/07 0932) FiO2 (%):  [32 %] 32 % (08/06 1422) Weight:  [88.8 kg (195 lb 12.8 oz)] 88.8 kg (195 lb 12.8 oz) (08/07 0400)  PHYSICAL EXAMINATION: General: Adult male, sitting in chair, in NAD. Neuro: A&O x 3, no deficits. HEENT: [place] / AT. MMM. Cardiovascular: Tachy, regular. Lungs:  Faint end expiratory wheeze. Abdomen: BS x 4, S/NT/ND. Musculoskeletal: No deformities or edema. Skin: Warm, dry.   Recent Labs Lab 09/11/16 0410 09/12/16 0435 09/13/16 0441  NA 134* 136 133*  K 4.0 4.4 4.5  CL 94* 96* 94*  CO2 35* 32 30  BUN 16 15 14   CREATININE 0.51* 0.57* 0.49*  GLUCOSE 119* 133* 113*    Recent Labs Lab 09/11/16 0410 09/12/16 0435 09/13/16 0441  HGB 8.4* 8.8* 8.3*  HCT 25.4* 27.5* 25.8*  WBC 9.5 11.9* 13.3*  PLT 173 270 293   Ct Angio Chest Pe W Or Wo Contrast  Result Date: 09/12/2016 CLINICAL DATA:  Shortness of breath. Status post aortic valve replacement 1 week ago. History of COPD. EXAM: CT ANGIOGRAPHY CHEST  WITH CONTRAST TECHNIQUE: Multidetector CT imaging of the chest was performed using the standard protocol during bolus administration of intravenous contrast. Multiplanar CT image reconstructions and MIPs were obtained to evaluate the vascular anatomy. CONTRAST:  80 cc Isovue 370 COMPARISON:  Chest radiograph September 12, 2016 at 1617 hours and CT chest January 12, 2015 FINDINGS: CARDIOVASCULAR: Adequate contrast opacification of the pulmonary artery's. Main pulmonary artery is not enlarged. No pulmonary arterial filling defects to the level of the subsegmental branches. Heart size is mildly enlarged, status post aortic valve replacement. Trace pericardial effusion. Thoracic aorta is normal course and caliber,  mild atherosclerosis. MEDIASTINUM/NODES: 12 mm short access presumably reactive subcarinal lymph node in the setting of recent surgery. Small amount of postoperative anterior mediastinal hematoma without focal fluid collection. Minimal pneumomediastinum. Severe centrilobular emphysema. LUNGS/PLEURA: Tracheobronchial tree is patent, no pneumothorax. Small bilateral pleural effusions. Bibasilar atelectasis. UPPER ABDOMEN: Nonacute.  Partially imaged LEFT parapelvic cyst. MUSCULOSKELETAL: Status post recent median sternotomy. RIGHT PICC distal tip in mid superior vena cava. Review of the MIP images confirms the above findings. IMPRESSION: 1. No acute pulmonary embolism. 2. Status post recent aortic valve replacement. Expected postoperative change. 3. Severe centrilobular emphysema.  Small pleural effusions. Aortic Atherosclerosis (ICD10-I70.0) and Emphysema (ICD10-J43.9). Electronically Signed   By: Awilda Metroourtnay  Bloomer M.D.   On: 09/12/2016 19:59   Dg Chest Port 1 View  Result Date: 09/12/2016 CLINICAL DATA:  Respiratory distress EXAM: PORTABLE CHEST 1 VIEW COMPARISON:  09/12/2016 FINDINGS: Cardiac shadow is stable. Postsurgical changes are again seen. Right-sided PICC line is again noted and stable. The lungs remain hyperinflated. No sizable effusion is seen. No pneumothorax is noted. IMPRESSION: COPD stable from the prior exam. No other focal abnormality is noted. Electronically Signed   By: Alcide CleverMark  Lukens M.D.   On: 09/12/2016 16:35   Dg Chest Port 1 View  Result Date: 09/12/2016 CLINICAL DATA:  Short of breath EXAM: PORTABLE CHEST 1 VIEW COMPARISON:  09/11/2016 FINDINGS: Sternotomy wires overlie enlarged cardiac silhouette. RIGHT PICC line remains. No effusion, infiltrate pneumothorax. Interval improvement of bilateral pleural effusions. Lungs are hyperinflated IMPRESSION: Improvement in bibasilar effusions.  Lungs are hyperinflated Electronically Signed   By: Genevive BiStewart  Edmunds M.D.   On: 09/12/2016 08:53     STUDIES:  CTA chest 8/6 > negative for PE but did demonstrate centrilobular emphysema with small pleural effusions. Echo 8/6 >  PFT's 7/26 > FVC 68% pred, FEV1 1.08 (32% pred), ratio 35 (47% pred).  DLCO 40% (55% when correct for alveolar volume).  No significant change after BD's.  SIGNIFICANT EVENTS  7/31 > AVR and CABG x 2.  ASSESSMENT / PLAN:  Acute hypoxic respiratory failure - likely due to underlying COPD, new A.flutter, volume overload post op.  Can not rule out chronic component. 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 Former smoker. Plan: Continue supplemental O2 as needed to maintain SpO2 > 92%. Assess ambulatory desat study prior to d/c to assess for home O2 needs. Change BD's to nebulizers for now to enhance delivery (budesonide / brovana in lieu of dulera, ipratropium in lieu of spiriva). Bronchial hygiene. Continue diuresis, goal negative balance. CXR intermittently. Needs to establish with pulmonology as outpatient - he and [place] versus come to [place]. Continue tobacco cessation.  Rest of care per primary team.   Rutherford Guysahul Xavien Dauphinais, PA - [facility] Pager: [phone]  or [phone] 09/13/2016, 12:03  PM

## 2016-09-13 NOTE — Progress Notes (Signed)
       [address]       [place] [id number]             [phone]     Breathing stable, but increased cough, mildly productive  With mild elevation of wbs and fever 101, will culture blood and urine, had previous urinary retention and required replacement of foley Will start iv antibiotics pending cultures

## 2016-09-13 NOTE — Progress Notes (Signed)
CARDIAC REHAB PHASE I   PRE:  Rate/Rhythm: 112 ST  BP:  Supine:   Sitting: 142/82  Standing:    SaO2: 97% 4L  MODE:  Ambulation: 72 ft   POST:  Rate/Rhythm: 123 ST  BP:  Supine:   Sitting: 137/76  Standing:    SaO2: 72% 4L  Bumped to 6L and sats still stayed in 70s  Made pt turn around 1040-1111 Pt stated he has white coat syndrome and he gets anxious. Seemed to get more SOB and anxious prior to walk. Pt walked 72 ft with asst x 2 and rolling walker. Monitored sats whole walk.  Pt desat to 72 on 4L; we quickly increased to 6L and encouraged purse lip breathing and rest stop. Pt is more a mouth breather and PLB is hard for him. Sats did not come up much with 6L so we turned around and back to room to recliner. Waited with pt and did PLB until sats came up to 90%. Put back on 4L. Remained in NSR.   Luetta Nuttingharlene Kallie Depolo, RN BSN  09/13/2016 11:07 AM

## 2016-09-14 ENCOUNTER — Inpatient Hospital Stay (HOSPITAL_COMMUNITY): Payer: BLUE CROSS/BLUE SHIELD

## 2016-09-14 DIAGNOSIS — I313 Pericardial effusion (noninflammatory): Secondary | ICD-10-CM

## 2016-09-14 LAB — BASIC METABOLIC PANEL
Anion gap: 8 (ref 5–15)
BUN: 18 mg/dL (ref 6–20)
CO2: 30 mmol/L (ref 22–32)
Calcium: 8.3 mg/dL — ABNORMAL LOW (ref 8.9–10.3)
Chloride: 93 mmol/L — ABNORMAL LOW (ref 101–111)
Creatinine, Ser: 0.65 mg/dL (ref 0.61–1.24)
GFR calc Af Amer: >60 mL/min (ref >60)
GFR calc non Af Amer: >60 mL/min (ref >60)
Glucose, Bld: 122 mg/dL — ABNORMAL HIGH (ref 65–99)
Potassium: 4.5 mmol/L (ref 3.5–5.1)
Sodium: 131 mmol/L — ABNORMAL LOW (ref 135–145)

## 2016-09-14 LAB — EXPECTORATED SPUTUM ASSESSMENT W GRAM STAIN, RFLX TO RESP C

## 2016-09-14 LAB — CBC
HCT: 23.8 % — ABNORMAL LOW (ref 39.0–52.0)
Hemoglobin: 7.6 g/dL — ABNORMAL LOW (ref 13.0–17.0)
MCH: 28.7 pg (ref 26.0–34.0)
MCHC: 31.9 g/dL (ref 30.0–36.0)
MCV: 89.8 fL (ref 78.0–100.0)
Platelets: 323 10*3/uL (ref 150–400)
RBC: 2.65 MIL/uL — ABNORMAL LOW (ref 4.22–5.81)
RDW: 13.6 % (ref 11.5–15.5)
WBC: 21.4 10*3/uL — ABNORMAL HIGH (ref 4.0–10.5)

## 2016-09-14 LAB — ECHOCARDIOGRAM COMPLETE
Height: 69 in
Weight: 3110.4 oz

## 2016-09-14 MED ORDER — FUROSEMIDE 40 MG PO TABS
40.0000 mg | ORAL_TABLET | Freq: Every day | ORAL | Status: DC
  Administered 2016-09-14 – 2016-09-20 (×7): 40 mg via ORAL
  Filled 2016-09-14 (×7): qty 1

## 2016-09-14 MED ORDER — PERFLUTREN LIPID MICROSPHERE
1.0000 mL | INTRAVENOUS | Status: AC | PRN
  Administered 2016-09-14: 2 mL via INTRAVENOUS
  Filled 2016-09-14: qty 10

## 2016-09-14 MED ORDER — POTASSIUM CHLORIDE CRYS ER 20 MEQ PO TBCR
40.0000 meq | EXTENDED_RELEASE_TABLET | Freq: Every day | ORAL | Status: DC
  Administered 2016-09-14 – 2016-09-20 (×7): 40 meq via ORAL
  Filled 2016-09-14 (×7): qty 2

## 2016-09-14 NOTE — Progress Notes (Addendum)
[address]       [place] [address]             [phone]      9 Days Post-Op Procedure(s) (LRB): AORTIC VALVE REPLACEMENT (AVR) USING 23 MM MAGNA EASE PERICARDIAL TISSUE VALVE (N/A) TRANSESOPHAGEAL ECHOCARDIOGRAM (TEE) (N/A) CORONARY ARTERY BYPASS GRAFTING (CABG) USING LIMA TO DISTAL LAD AND ENDOSCOPICALLY HARVESTED GREATER SAPHENOUS VEIN TO CIRC. (N/A) ENDOVEIN HARVEST OF GREATER SAPHENOUS VEIN (Right) Subjective: Feels fairly well , some SOB persists, + UA, now on rocephin  Objective: Vital signs in last 24 hours: Temp:  [98.5 F (36.9 C)-101.3 F (38.5 C)] 99.4 F (37.4 C) (08/08 0415) Pulse Rate:  [100-103] 100 (08/08 0415) Cardiac Rhythm: Sinus tachycardia (08/08 0701) Resp:  [18-35] 35 (08/08 0415) BP: (90-123)/(62-74) 121/74 (08/08 0415) SpO2:  [84 %-98 %] 91 % (08/08 0415) Weight:  [194 lb 6.4 oz (88.2 kg)] 194 lb 6.4 oz (88.2 kg) (08/08 0415)  Hemodynamic parameters for last 24 hours:    Intake/Output from previous day: 08/07 0701 - 08/08 0700 In: 567 [P.O.:360; I.V.:157; IV Piggyback:50] Out: 300 [Urine:300] Intake/Output this shift: No intake/output data recorded.  General appearance: alert, cooperative and no distress Heart: regular rate and rhythm Lungs: fair air exchange throughout, no wheeze Abdomen: benign Extremities: minor edema Wound: incis healing well  Lab Results:  Recent Labs  09/13/16 0441 09/14/16 0519  WBC 13.3* 21.4*  HGB 8.3* 7.6*  HCT 25.8* 23.8*  PLT 293 323   BMET:  Recent Labs  09/13/16 0441 09/14/16 0519  NA 133* 131*  K 4.5 4.5  CL 94* 93*  CO2 30 30  GLUCOSE 113* 122*  BUN 14 18  CREATININE 0.49* 0.65  CALCIUM 8.7* 8.3*    PT/INR: No results for input(s): LABPROT, INR in the last 72 hours. ABG    Component Value Date/Time   PHART 7.448 09/12/2016 1610   HCO3 31.4 (H) 09/12/2016 1610   TCO2 24 09/06/2016 1639   ACIDBASEDEF 3.0 (H) 09/05/2016 2325   O2SAT 91.5 09/12/2016 1610    CBG (last 3)  No results for input(s): GLUCAP in the last 72 hours.  Meds Scheduled Meds: . amiodarone  200 mg Oral BID  . arformoterol  15 mcg Nebulization BID  . aspirin EC  325 mg Oral Daily   Or  . aspirin  324 mg Per Tube Daily  . atorvastatin  40 mg Oral Daily  . bisacodyl  10 mg Oral Daily   Or  . bisacodyl  10 mg Rectal Daily  . budesonide (PULMICORT) nebulizer solution  0.5 mg Nebulization BID  . Chlorhexidine Gluconate Cloth  6 each Topical Daily  . docusate sodium  200 mg Oral Daily  . enoxaparin (LOVENOX) injection  40 mg Subcutaneous Q24H  . furosemide  40 mg Oral BID  . ipratropium  0.5 mg Nebulization TID  . loratadine  10 mg Oral Daily  . mouth rinse  15 mL Mouth Rinse BID  . pantoprazole  40 mg Oral Daily  . potassium chloride  40 mEq Oral BID  . sodium chloride flush  10-40 mL Intracatheter Q12H  . sodium chloride flush  3 mL Intravenous Q12H  . tamsulosin  0.4 mg Oral QPC supper   Continuous Infusions: . sodium chloride    . cefTRIAXone (ROCEPHIN)  IV Stopped (09/13/16 2020)   PRN Meds:.sodium chloride, acetaminophen, alum & mag hydroxide-simeth, fluticasone, guaiFENesin-dextromethorphan, levalbuterol, magnesium hydroxide, ondansetron (ZOFRAN) IV, oxyCODONE, sodium chloride flush,  sodium chloride flush, traMADol, zolpidem  Xrays Ct Angio Chest Pe W Or Wo Contrast  Result Date: 09/12/2016 CLINICAL DATA:  Shortness of breath. Status post aortic valve replacement 1 week ago. History of COPD. EXAM: CT ANGIOGRAPHY CHEST WITH CONTRAST TECHNIQUE: Multidetector CT imaging of the chest was performed using the standard protocol during bolus administration of intravenous contrast. Multiplanar CT image reconstructions and MIPs were obtained to evaluate the vascular anatomy. CONTRAST:  80 cc Isovue 370 COMPARISON:  Chest radiograph September 12, 2016 at 1617 hours and CT chest January 12, 2015 FINDINGS: CARDIOVASCULAR: Adequate contrast opacification of the pulmonary  artery's. Main pulmonary artery is not enlarged. No pulmonary arterial filling defects to the level of the subsegmental branches. Heart size is mildly enlarged, status post aortic valve replacement. Trace pericardial effusion. Thoracic aorta is normal course and caliber, mild atherosclerosis. MEDIASTINUM/NODES: 12 mm short access presumably reactive subcarinal lymph node in the setting of recent surgery. Small amount of postoperative anterior mediastinal hematoma without focal fluid collection. Minimal pneumomediastinum. Severe centrilobular emphysema. LUNGS/PLEURA: Tracheobronchial tree is patent, no pneumothorax. Small bilateral pleural effusions. Bibasilar atelectasis. UPPER ABDOMEN: Nonacute.  Partially imaged LEFT parapelvic cyst. MUSCULOSKELETAL: Status post recent median sternotomy. RIGHT PICC distal tip in mid superior vena cava. Review of the MIP images confirms the above findings. IMPRESSION: 1. No acute pulmonary embolism. 2. Status post recent aortic valve replacement. Expected postoperative change. 3. Severe centrilobular emphysema.  Small pleural effusions. Aortic Atherosclerosis (ICD10-I70.0) and Emphysema (ICD10-J43.9). Electronically Signed   By: Awilda Metroourtnay  Bloomer M.D.   On: 09/12/2016 19:59   Dg Chest Port 1 View  Result Date: 09/12/2016 CLINICAL DATA:  Respiratory distress EXAM: PORTABLE CHEST 1 VIEW COMPARISON:  09/12/2016 FINDINGS: Cardiac shadow is stable. Postsurgical changes are again seen. Right-sided PICC line is again noted and stable. The lungs remain hyperinflated. No sizable effusion is seen. No pneumothorax is noted. IMPRESSION: COPD stable from the prior exam. No other focal abnormality is noted. Electronically Signed   By: Alcide CleverMark  Lukens M.D.   On: 09/12/2016 16:35   Dg Chest Port 1 View  Result Date: 09/12/2016 CLINICAL DATA:  Short of breath EXAM: PORTABLE CHEST 1 VIEW COMPARISON:  09/11/2016 FINDINGS: Sternotomy wires overlie enlarged cardiac silhouette. RIGHT PICC line  remains. No effusion, infiltrate pneumothorax. Interval improvement of bilateral pleural effusions. Lungs are hyperinflated IMPRESSION: Improvement in bibasilar effusions.  Lungs are hyperinflated Electronically Signed   By: Genevive BiStewart  Edmunds M.D.   On: 09/12/2016 08:53    Assessment/Plan: S/P Procedure(s) (LRB): AORTIC VALVE REPLACEMENT (AVR) USING 23 MM MAGNA EASE PERICARDIAL TISSUE VALVE (N/A) TRANSESOPHAGEAL ECHOCARDIOGRAM (TEE) (N/A) CORONARY ARTERY BYPASS GRAFTING (CABG) USING LIMA TO DISTAL LAD AND ENDOSCOPICALLY HARVESTED GREATER SAPHENOUS VEIN TO CIRC. (N/A) ENDOVEIN HARVEST OF GREATER SAPHENOUS VEIN (Right)   1 doing about the same from pulm status , appreciate pulm consult 2 sinus rhythm on po amio- cardiology assisting with management 3 rocephin for presumed UTI, monitor fever curve and WBC count, now 21 4 decrease lasix to daily 45 H/H 7.6/23- close to transfusion threshold - may benefit from O2 carrying capacity  LOS: 9 days    GOLD,WAYNE E 09/14/2016  Now on rocephan for 24 hours, ua suspecious for tui culture results bending  Holding sinus  Feels little better today  Walked to desk I have seen and examined [name] and agree with the above assessment  and plan.  Delight OvensEdward B Dawnisha Marquina MD [facility] [phone] Office [phone] 09/14/2016 5:43 PM

## 2016-09-14 NOTE — Progress Notes (Signed)
  Echocardiogram 2D Echocardiogram with Definity has been performed.  [name], [name] 09/14/2016, 12:05 PM

## 2016-09-14 NOTE — Progress Notes (Addendum)
Name: [name] MRN: [id number] DOB: 1950-07-29    ADMISSION DATE:  09/05/2016 CONSULTATION DATE:  09/13/16  REFERRING MD :  Tyrone SageGerhardt  CHIEF COMPLAINT:  COPD management   HISTORY OF PRESENT ILLNESS:  [name] is a 66 y.o. male with a PMH as outlined below who recently underwent AVR and CABG x 2 on 09/06/16.  He had done well post operatively with the exception of developing A.flutter with RVR on 8/6.  Cardiology was consulted, he was paced out back to NSR and was also started on amiodarone with successful maintenance of NSR.  That same day, he developed dyspnea which he felt was due to COPD.  He had CTA obtained which was negative for PE but did demonstrate centrilobular emphysema with small pleural effusions.  On 8/7, PCCM was asked to see in consultation for ongoing COPD management. He had PFT's 7/26 which were as follows:  FVC 68% pred, FEV1 1.08 (32% pred), ratio 35 (47% pred).  DLCO 40% (55% when correct for alveolar volume).  No significant change after BD's. He has roughly a 40 pack year smoking history and quit roughly 14 years ago.  He has not seen or does not follow with a pulmonologist.  His outpatient medication list includes advair, albuterol PRN, spiriva.  He used advair and spiriva daily and albuterol only maybe every other day or two.  SUBJECTIVE:   Tmax 101.3, UA + for UTI, WBC 21, started on ceftriaxone  Feels "worn out", SOB the same, some pleuritic pain w/cough Ambulated 78 feet yesterday with rehab and desaturated to 70's, required 6L to recover  VITAL SIGNS: Temp:  [98.5 F (36.9 C)-101.3 F (38.5 C)] 98.7 F (37.1 C) (08/08 0813) Pulse Rate:  [100-103] 100 (08/08 0415) Resp:  [18-35] 35 (08/08 0415) BP: (90-123)/(62-74) 121/74 (08/08 0415) SpO2:  [84 %-100 %] 100 % (08/08 0700) Weight:  [194 lb 6.4 oz (88.2 kg)] 194 lb 6.4 oz (88.2 kg) (08/08 0415)  PHYSICAL EXAMINATION: General: Weak appearing adult male lying in bed in NAD Neuro: Tired  appearing, AO x 3, non focal HEENT:  / AT. MMM. Cardiovascular: RRR, 103. Lungs:  Faint end expiratory wheeze in bases, otherwise diminished, non labored, 98% on 4L Abdomen: Round, soft, BS +, NT Musculoskeletal: No deformities or edema. Skin: Warm, dry.   Recent Labs Lab 09/12/16 0435 09/13/16 0441 09/14/16 0519  NA 136 133* 131*  K 4.4 4.5 4.5  CL 96* 94* 93*  CO2 32 30 30  BUN 15 14 18   CREATININE 0.57* 0.49* 0.65  GLUCOSE 133* 113* 122*    Recent Labs Lab 09/12/16 0435 09/13/16 0441 09/14/16 0519  HGB 8.8* 8.3* 7.6*  HCT 27.5* 25.8* 23.8*  WBC 11.9* 13.3* 21.4*  PLT 270 293 323   Ct Angio Chest Pe W Or Wo Contrast  Result Date: 09/12/2016 CLINICAL DATA:  Shortness of breath. Status post aortic valve replacement 1 week ago. History of COPD. EXAM: CT ANGIOGRAPHY CHEST WITH CONTRAST TECHNIQUE: Multidetector CT imaging of the chest was performed using the standard protocol during bolus administration of intravenous contrast. Multiplanar CT image reconstructions and MIPs were obtained to evaluate the vascular anatomy. CONTRAST:  80 cc Isovue 370 COMPARISON:  Chest radiograph September 12, 2016 at 1617 hours and CT chest January 12, 2015 FINDINGS: CARDIOVASCULAR: Adequate contrast opacification of the pulmonary artery's. Main pulmonary artery is not enlarged. No pulmonary arterial filling defects to the level of the subsegmental branches. Heart size is mildly enlarged, status post aortic  valve replacement. Trace pericardial effusion. Thoracic aorta is normal course and caliber, mild atherosclerosis. MEDIASTINUM/NODES: 12 mm short access presumably reactive subcarinal lymph node in the setting of recent surgery. Small amount of postoperative anterior mediastinal hematoma without focal fluid collection. Minimal pneumomediastinum. Severe centrilobular emphysema. LUNGS/PLEURA: Tracheobronchial tree is patent, no pneumothorax. Small bilateral pleural effusions. Bibasilar atelectasis. UPPER  ABDOMEN: Nonacute.  Partially imaged LEFT parapelvic cyst. MUSCULOSKELETAL: Status post recent median sternotomy. RIGHT PICC distal tip in mid superior vena cava. Review of the MIP images confirms the above findings. IMPRESSION: 1. No acute pulmonary embolism. 2. Status post recent aortic valve replacement. Expected postoperative change. 3. Severe centrilobular emphysema.  Small pleural effusions. Aortic Atherosclerosis (ICD10-I70.0) and Emphysema (ICD10-J43.9). Electronically Signed   By: Awilda Metro M.D.   On: 09/12/2016 19:59   Dg Chest Port 1 View  Result Date: 09/12/2016 CLINICAL DATA:  Respiratory distress EXAM: PORTABLE CHEST 1 VIEW COMPARISON:  09/12/2016 FINDINGS: Cardiac shadow is stable. Postsurgical changes are again seen. Right-sided PICC line is again noted and stable. The lungs remain hyperinflated. No sizable effusion is seen. No pneumothorax is noted. IMPRESSION: COPD stable from the prior exam. No other focal abnormality is noted. Electronically Signed   By: Alcide Clever M.D.   On: 09/12/2016 16:35    STUDIES:  CTA chest 8/6 > negative for PE but did demonstrate centrilobular emphysema with small pleural effusions. Echo 8/6 >  PFT's 7/26 > FVC 68% pred, FEV1 1.08 (32% pred), ratio 35 (47% pred).  DLCO 40% (55% when correct for alveolar volume).  No significant change after BD's.  SIGNIFICANT EVENTS  7/31 > AVR and CABG x 2.  Cultures:  [facility] 8/7 >> UA >> moderate leuk, protein 100, numerous WBC Sputum cx 8/8 >>  Abx: Ceftriaxone 8/7 >>  ASSESSMENT / PLAN:  Acute hypoxic respiratory failure - multifactorial likely due to underlying COPD, new A.flutter, volume overload post op.  Can not rule out chronic component. 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 Former smoker. Plan: Continue supplemental O2 as needed to maintain SpO2 > 92%. Continue budesonide / brovana nebs BID (in lieu of dulera) Continue xopenex and ipratropium (in lieu of spiriva) Ongoing  pulmonary hygiene Continue diuresis per primary, goal negative balance. CXR 8/9 Follow sputum cx Needs to establish with pulmonology as outpatient with screening for alpha-1 antitrypsin deficiency - he and wife prefer to speak to their PCP in [place] versus come to [place]. Assess ambulatory desat study prior to d/c to assess for home O2 needs. Continue tobacco cessation  Rest of care per primary team.  PCCM will continue to follow   Posey Boyer, AGACNP-BC [facility] Pgr: [phone] or if no answer [phone] 09/14/2016, 2:23 PM   Attending note: I have seen and examined the patient with nurse practitioner/resident and agree with the note. History, labs and imaging reviewed.  66 Y/O with very severe COPD, CAD, AS s/p AVR PCCM consulted for help with management of COPD  Blood pressure 110/63, pulse 95, temperature 99.5 F (37.5 C), temperature source Oral, resp. rate 20, height 5\' 9"  (1.753 m), weight 194 lb 6.4 oz (88.2 kg), SpO2 96 %. Gen:      No acute distress HEENT:  EOMI, sclera anicteric Neck:     No masses; no thyromegaly Lungs:    Clear to auscultation bilaterally; normal respiratory effort CV:         Regular rate and rhythm; no murmurs Abd:      +  bowel sounds; soft, non-tender; no palpable masses, no distension Ext:    No edema; adequate peripheral perfusion Skin:      Warm and dry; no rash Neuro: alert and oriented x 3 Psych: normal mood and affect  Assessment/Plan: Severe COPD Resp status is about the same Now on rocephin for UTI  - Continue brovana, pulmicort nebs - Xopenex and atrovent - Will need assessment for O2 needs on discharge - Wants to follow with Dr. Juanetta Gosling for pulm needs.  PCCM will follow while he is inpatient.  Chilton Greathouse MD [facility] Pager [phone] If no answer or after 3pm call: [phone] 09/14/2016, 8:29 PM

## 2016-09-15 ENCOUNTER — Inpatient Hospital Stay (HOSPITAL_COMMUNITY): Payer: BLUE CROSS/BLUE SHIELD

## 2016-09-15 DIAGNOSIS — J439 Emphysema, unspecified: Secondary | ICD-10-CM

## 2016-09-15 LAB — BASIC METABOLIC PANEL
Anion gap: 8 (ref 5–15)
BUN: 17 mg/dL (ref 6–20)
CO2: 31 mmol/L (ref 22–32)
Calcium: 8.3 mg/dL — ABNORMAL LOW (ref 8.9–10.3)
Chloride: 94 mmol/L — ABNORMAL LOW (ref 101–111)
Creatinine, Ser: 0.57 mg/dL — ABNORMAL LOW (ref 0.61–1.24)
GFR calc Af Amer: >60 mL/min (ref >60)
GFR calc non Af Amer: >60 mL/min (ref >60)
Glucose, Bld: 118 mg/dL — ABNORMAL HIGH (ref 65–99)
Potassium: 4.3 mmol/L (ref 3.5–5.1)
Sodium: 133 mmol/L — ABNORMAL LOW (ref 135–145)

## 2016-09-15 LAB — CBC WITH DIFFERENTIAL/PLATELET
Basophils Absolute: 0 10*3/uL (ref 0.0–0.1)
Basophils Relative: 0 %
Eosinophils Absolute: 0.3 10*3/uL (ref 0.0–0.7)
Eosinophils Relative: 2 %
HCT: 23.8 % — ABNORMAL LOW (ref 39.0–52.0)
Hemoglobin: 7.6 g/dL — ABNORMAL LOW (ref 13.0–17.0)
Lymphocytes Relative: 4 %
Lymphs Abs: 0.9 10*3/uL (ref 0.7–4.0)
MCH: 28.8 pg (ref 26.0–34.0)
MCHC: 31.9 g/dL (ref 30.0–36.0)
MCV: 90.2 fL (ref 78.0–100.0)
Monocytes Absolute: 2.1 10*3/uL — ABNORMAL HIGH (ref 0.1–1.0)
Monocytes Relative: 11 %
Neutro Abs: 17 10*3/uL — ABNORMAL HIGH (ref 1.7–7.7)
Neutrophils Relative %: 83 %
Platelets: 341 10*3/uL (ref 150–400)
RBC: 2.64 MIL/uL — ABNORMAL LOW (ref 4.22–5.81)
RDW: 13.7 % (ref 11.5–15.5)
WBC: 20.3 10*3/uL — ABNORMAL HIGH (ref 4.0–10.5)

## 2016-09-15 LAB — URINE CULTURE: Culture: >=100000 — AB

## 2016-09-15 MED ORDER — FOLIC ACID 1 MG PO TABS
1.0000 mg | ORAL_TABLET | Freq: Every day | ORAL | Status: DC
  Administered 2016-09-15 – 2016-09-20 (×6): 1 mg via ORAL
  Filled 2016-09-15 (×6): qty 1

## 2016-09-15 MED ORDER — FERROUS SULFATE 325 (65 FE) MG PO TABS
325.0000 mg | ORAL_TABLET | Freq: Two times a day (BID) | ORAL | Status: DC
  Administered 2016-09-15 – 2016-09-20 (×11): 325 mg via ORAL
  Filled 2016-09-15 (×11): qty 1

## 2016-09-15 NOTE — Progress Notes (Signed)
Name: [name] MRN: [id number] DOB: 02-10-1950    ADMISSION DATE:  09/05/2016 CONSULTATION DATE:  09/13/16  REFERRING MD :  Tyrone SageGerhardt  CHIEF COMPLAINT:  COPD management   HISTORY OF PRESENT ILLNESS:  [name] is a 66 y.o. male with a PMH as outlined below who recently underwent AVR and CABG x 2 on 09/06/16.  He had done well post operatively with the exception of developing A.flutter with RVR on 8/6.  Cardiology was consulted, he was paced out back to NSR and was also started on amiodarone with successful maintenance of NSR.  That same day, he developed dyspnea which he felt was due to COPD.  He had CTA obtained which was negative for PE but did demonstrate centrilobular emphysema with small pleural effusions.  On 8/7, PCCM was asked to see in consultation for ongoing COPD management. He had PFT's 7/26 which were as follows:  FVC 68% pred, FEV1 1.08 (32% pred), ratio 35 (47% pred).  DLCO 40% (55% when correct for alveolar volume).  No significant change after BD's. He has roughly a 40 pack year smoking history and quit roughly 14 years ago.  He has not seen or does not follow with a pulmonologist.  His outpatient medication list includes advair, albuterol PRN, spiriva.  He used advair and spiriva daily and albuterol only maybe every other day or two.  SUBJECTIVE:   States that breathing is slightly better  VITAL SIGNS: Temp:  [97.9 F (36.6 C)-99.5 F (37.5 C)] 97.9 F (36.6 C) (08/09 0000) Pulse Rate:  [88-103] 103 (08/09 0830) Resp:  [17-20] 17 (08/09 0830) BP: (108-120)/(61-66) 120/63 (08/09 0830) SpO2:  [93 %-98 %] 96 % (08/09 0830)  PHYSICAL EXAMINATION: Gen:      No acute distress HEENT:  EOMI, sclera anicteric Neck:     No masses; no thyromegaly Lungs:    Poor air entry, mild exp wheeze; normal respiratory effort CV:         Regular rate and rhythm; no murmurs Abd:      + bowel sounds; soft, non-tender; no palpable masses, no distension Ext:    No edema;  adequate peripheral perfusion Skin:      Warm and dry; no rash Neuro: alert and oriented x 3 Psych: normal mood and affect   Recent Labs Lab 09/13/16 0441 09/14/16 0519 09/15/16 0318  NA 133* 131* 133*  K 4.5 4.5 4.3  CL 94* 93* 94*  CO2 30 30 31   BUN 14 18 17   CREATININE 0.49* 0.65 0.57*  GLUCOSE 113* 122* 118*    Recent Labs Lab 09/13/16 0441 09/14/16 0519 09/15/16 0318  HGB 8.3* 7.6* 7.6*  HCT 25.8* 23.8* 23.8*  WBC 13.3* 21.4* 20.3*  PLT 293 323 341   Dg Chest Port 1 View  Result Date: 09/15/2016 CLINICAL DATA:  COPD. EXAM: PORTABLE CHEST 1 VIEW COMPARISON:  Multiple prior chest x-rays, most recently dated September 12, 2016. FINDINGS: The left diaphragm and costophrenic angle are excluded from the field-of-view. Right PICC catheter with tip in the distal SVC. Prior CABG. Stable cardiomediastinal silhouette. Normal pulmonary vascularity. Hyperinflation. No focal consolidation, pleural effusion, or pneumothorax. No acute osseous abnormality. IMPRESSION: COPD. No active cardiopulmonary disease. Please note the left diaphragm and costophrenic angle are not included in the field of view. Electronically Signed   By: Obie DredgeWilliam T Derry M.D.   On: 09/15/2016 08:10    STUDIES:  CTA chest 8/6 > negative for PE but did demonstrate centrilobular emphysema with small pleural  effusions. Echo 8/6 >  PFT's 7/26 > FVC 68% pred, FEV1 1.08 (32% pred), ratio 35 (47% pred).  DLCO 40% (55% when correct for alveolar volume).  No significant change after BD's.  SIGNIFICANT EVENTS  7/31 > AVR and CABG x 2.  Cultures:  [facility] 8/7 >> UA >> moderate leuk, protein 100, numerous WBC Sputum cx 8/8 >>  Abx: Ceftriaxone 8/7 >>  ASSESSMENT / PLAN:  Acute hypoxic respiratory failure - multifactorial likely due to underlying COPD, new A.flutter, volume overload post op.  Can not rule out chronic component. 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 Former smoker. Plan: -Wean down O2 as  tolerated -Will need ambulatory sats check before discharge. Will likely need to go home on O2 - Continue brovana, pulmicort nebs - Xopenex and atrovent - Can switch to home dulera and spiriva on discharge - Wants to follow with Dr. Juanetta Gosling for pulm needs as out patient. Will need work up for A1AT levels in clinic  Rest of care per primary team.  PCCM will continue to follow  Chilton Greathouse MD [facility] Pager [phone] If no answer or after 3pm call: [phone] 09/15/2016, 12:00 PM

## 2016-09-15 NOTE — Progress Notes (Signed)
CARDIAC REHAB PHASE I   PRE:  Rate/Rhythm: 103 ST  BP:  Supine: 120/63  Sitting:   Standing:    SaO2: 96% 3L  MODE:  Ambulation: 270 ft   POST:  Rate/Rhythm: 119 ST  BP:  Supine:   Sitting: 134/65  Standing:    SaO2: 84% 3L  To 6L to keep sats up 87-89%   High of 91% 0830-0910 Pt had just received breathing treatment. Walked 270 ft with gait belt use, rolling walker and asst x 2. Monitored sats during walk. Started out on 3L but had to increase to 6L to keep sats 87-89%. Had pt stop and take rest breaks. Also had pt use purse lip breathing which is hard for him as he is used to breathing through mouth. Put sat probe on ear to get better reading during walk. To recliner after walk and let pt recover. Then to 3L again. Encouraged walks with staff today too.   Daniel Nuttingharlene Izael Bessinger, RN BSN  09/15/2016 9:06 AM

## 2016-09-15 NOTE — Progress Notes (Signed)
CARDIAC REHAB PHASE I   PRE:  Rate/Rhythm: 98 SR  BP:  Supine:   Sitting: 117/63  Standing:    SaO2: 94% 3L  To BSC 85% 4L  MODE:  Ambulation: 200 ft   POST:  Rate/Rhythm: 115 ST  BP:  Supine:   Sitting: 117/63  Standing:    SaO2: 86-89% 6L   After recovery in room,back to 3L 90% Late entry due to computer down yesterday 1418-1450 [facility] to walk and sats dropped. Had to put on 6L to keep sats up.  Pt able to walk 200 ft on 6L with rolling walker and asst x 2. Encouraged to purse lip breathe. Able to talk a little and walk. Tolerated much better than day before. Still gets anxious and needs a couple of rest breaks. Took a few minutes sitting in recliner on 6L to recover. Once he reached 90-91% put back on 3L in room.  Luetta Nuttingharlene Dosia Yodice, RN BSN  09/15/2016 7:10 AM

## 2016-09-15 NOTE — Progress Notes (Addendum)
[address]       [place] [id number]             [phone]      10 Days Post-Op Procedure(s) (LRB): AORTIC VALVE REPLACEMENT (AVR) USING 23 MM MAGNA EASE PERICARDIAL TISSUE VALVE (N/A) TRANSESOPHAGEAL ECHOCARDIOGRAM (TEE) (N/A) CORONARY ARTERY BYPASS GRAFTING (CABG) USING LIMA TO DISTAL LAD AND ENDOSCOPICALLY HARVESTED GREATER SAPHENOUS VEIN TO CIRC. (N/A) ENDOVEIN HARVEST OF GREATER SAPHENOUS VEIN (Right) Subjective: Feels a little better, still SOB with fairly low level activity  Objective: Vital signs in last 24 hours: Temp:  [97.9 F (36.6 C)-99.5 F (37.5 C)] 97.9 F (36.6 C) (08/09 0000) Pulse Rate:  [88-95] 95 (08/09 0600) Cardiac Rhythm: Normal sinus rhythm (08/09 0700) Resp:  [20] 20 (08/09 0600) BP: (108-112)/(61-66) 112/66 (08/09 0600) SpO2:  [93 %-98 %] 98 % (08/09 0600)  Hemodynamic parameters for last 24 hours:    Intake/Output from previous day: 08/08 0701 - 08/09 0700 In: 720 [P.O.:480] Out: 775 [Urine:775] Intake/Output this shift: No intake/output data recorded.  General appearance: alert, cooperative and no distress Heart: regular rate and rhythm Lungs: clear to auscultation bilaterally Abdomen: benign Extremities: min edema Wound: incis healing well  Lab Results:  Recent Labs  09/14/16 0519 09/15/16 0318  WBC 21.4* 20.3*  HGB 7.6* 7.6*  HCT 23.8* 23.8*  PLT 323 341   BMET:  Recent Labs  09/14/16 0519 09/15/16 0318  NA 131* 133*  K 4.5 4.3  CL 93* 94*  CO2 30 31  GLUCOSE 122* 118*  BUN 18 17  CREATININE 0.65 0.57*  CALCIUM 8.3* 8.3*    PT/INR: No results for input(s): LABPROT, INR in the last 72 hours. ABG    Component Value Date/Time   PHART 7.448 09/12/2016 1610   HCO3 31.4 (H) 09/12/2016 1610   TCO2 24 09/06/2016 1639   ACIDBASEDEF 3.0 (H) 09/05/2016 2325   O2SAT 91.5 09/12/2016 1610   CBG (last 3)  No results for input(s): GLUCAP in the last 72 hours.  Meds Scheduled Meds: .  amiodarone  200 mg Oral BID  . arformoterol  15 mcg Nebulization BID  . aspirin EC  325 mg Oral Daily   Or  . aspirin  324 mg Per Tube Daily  . atorvastatin  40 mg Oral Daily  . bisacodyl  10 mg Oral Daily   Or  . bisacodyl  10 mg Rectal Daily  . budesonide (PULMICORT) nebulizer solution  0.5 mg Nebulization BID  . Chlorhexidine Gluconate Cloth  6 each Topical Daily  . docusate sodium  200 mg Oral Daily  . enoxaparin (LOVENOX) injection  40 mg Subcutaneous Q24H  . furosemide  40 mg Oral Daily  . ipratropium  0.5 mg Nebulization TID  . loratadine  10 mg Oral Daily  . mouth rinse  15 mL Mouth Rinse BID  . pantoprazole  40 mg Oral Daily  . potassium chloride  40 mEq Oral Daily  . sodium chloride flush  10-40 mL Intracatheter Q12H  . sodium chloride flush  3 mL Intravenous Q12H  . tamsulosin  0.4 mg Oral QPC supper   Continuous Infusions: . sodium chloride    . cefTRIAXone (ROCEPHIN)  IV Stopped (09/14/16 2106)   PRN Meds:.sodium chloride, acetaminophen, alum & mag hydroxide-simeth, fluticasone, guaiFENesin-dextromethorphan, levalbuterol, magnesium hydroxide, ondansetron (ZOFRAN) IV, oxyCODONE, sodium chloride flush, sodium chloride flush, traMADol, zolpidem  Results for orders placed or performed during the hospital encounter of 09/05/16  Culture, blood (  Routine X 2) w Reflex to ID Panel     Status: None (Preliminary result)   Collection Time: 09/13/16  6:23 PM  Result Value Ref Range Status   Specimen Description BLOOD LEFT ANTECUBITAL  Final   Special Requests   Final    BOTTLES DRAWN AEROBIC AND ANAEROBIC Blood Culture results may not be optimal due to an excessive volume of blood received in culture bottles   Culture NO GROWTH < 24 HOURS  Final   Report Status PENDING  Incomplete  Culture, blood (Routine X 2) w Reflex to ID Panel     Status: None (Preliminary result)   Collection Time: 09/13/16  6:31 PM  Result Value Ref Range Status   Specimen Description BLOOD LEFT HAND   Final   Special Requests   Final    BOTTLES DRAWN AEROBIC AND ANAEROBIC Blood Culture adequate volume   Culture NO GROWTH < 24 HOURS  Final   Report Status PENDING  Incomplete  Urine Culture     Status: Abnormal   Collection Time: 09/13/16  6:50 PM  Result Value Ref Range Status   Specimen Description URINE, CLEAN CATCH  Final   Special Requests NONE  Final   Culture >=100,000 COLONIES/mL ESCHERICHIA COLI (A)  Final   Report Status 09/15/2016 FINAL  Final   Organism ID, Bacteria ESCHERICHIA COLI (A)  Final      Susceptibility   Escherichia coli - MIC*    AMPICILLIN <=2 SENSITIVE Sensitive     CEFAZOLIN <=4 SENSITIVE Sensitive     CEFTRIAXONE <=1 SENSITIVE Sensitive     CIPROFLOXACIN <=0.25 SENSITIVE Sensitive     GENTAMICIN <=1 SENSITIVE Sensitive     IMIPENEM <=0.25 SENSITIVE Sensitive     NITROFURANTOIN <=16 SENSITIVE Sensitive     TRIMETH/SULFA <=20 SENSITIVE Sensitive     AMPICILLIN/SULBACTAM <=2 SENSITIVE Sensitive     PIP/TAZO <=4 SENSITIVE Sensitive     Extended ESBL NEGATIVE Sensitive     * >=100,000 COLONIES/mL ESCHERICHIA COLI  Culture, expectorated sputum-assessment     Status: None   Collection Time: 09/14/16  2:30 PM  Result Value Ref Range Status   Specimen Description EXPECTORATED SPUTUM  Final   Special Requests NONE  Final   Sputum evaluation   Final    Sputum specimen not acceptable for testing.  Please recollect.   Gram Stain Report Called to,Read Back By and Verified With: RN Jessie Foot [phone] MLM    Report Status 09/14/2016 FINAL  Final   Result status: Final result                              *Ogema*                   *[facility]*                         [address]                        [place], [place] [address]                            [phone]  ------------------------------------------------------------------- Transthoracic Echocardiography  Patient:    [name], [name] MR #:       [id number] Study  Date: 09/14/2016 Gender:     Judie Petit  Age:        66 Height:     175.3 cm Weight:     88.2 kg BSA:        2.09 m^2 Pt. Status: Room:       4E04C   ADMITTING    Sheliah Plane MD  ATTENDING    Sheliah Plane MD  SONOGRAPHER  Nolon Rod, RDCS  ORDERING     Doylene Canning, Deniece Portela E  REFERRING    Gershon Crane E  PERFORMING   [facility], Inpatient  cc:  ------------------------------------------------------------------- LV EF: 65% -   70%  ------------------------------------------------------------------- Indications:      Dyspnea 786.09.  ------------------------------------------------------------------- History:   PMH:   Chronic obstructive pulmonary disease.  Risk factors:  Former tobacco use. Hypertension.  ------------------------------------------------------------------- Study Conclusions  - Left ventricle: The cavity size was moderately dilated. Systolic   function was vigorous. The estimated ejection fraction was in the   range of 65% to 70%. Wall motion was normal; there were no   regional wall motion abnormalities. Left ventricular diastolic   function parameters were normal. - Aortic valve: A 23mm Magna Ease Pericardial bioprosthesis is   present and functioning normally. There was no regurgitation.   There was no significant perivalvular regurgitation. Peak   velocity (S): 287 cm/s. Mean gradient (S): 20 mm Hg. - Left atrium: The atrium was mildly dilated. Anterior-posterior   dimension: 42 mm. - Right ventricle: The cavity size was mildly dilated. Wall   thickness was normal. - Right atrium: The atrium was mildly dilated. - Pulmonary arteries: Systolic pressure could not be accurately   estimated.  Impressions:  - Compared to prior echo, LVF remains preserved. There is now a   bioprosthetic AVR in place and appears to be functioning grossly   normal. This valve is not well visualized. The mean AV gradient   is . There is no perivalvular  AI.  ------------------------------------------------------------------- Labs, prior tests, procedures, and surgery: (current admission, 09/06/2016).     Aortic valve replacement.  ------------------------------------------------------------------- Study data:   Study status:  Routine.  Procedure:  Transthoracic echocardiography. The study was technically difficult, as a result of poor acoustic windows, poor sound wave transmission, chest wall deformity, and surgical dressings. Intravenous contrast (Definity) was administered.  Study completion:  There were no complications.         Transthoracic echocardiography.  M-mode, complete 2D, spectral Doppler, and color Doppler.  Birthdate:  Patient birthdate: 08/02/1950.  Age:  Patient is 66 yr old.  Sex:  Gender: male.    BMI: 28.7 kg/m^2.  Blood pressure:     121/74  Patient status:  Inpatient.  Study date:  Study date: 09/14/2016. Study time: 10:15 AM.  Location:  Bedside.  -------------------------------------------------------------------  ------------------------------------------------------------------- Left ventricle:  The cavity size was moderately dilated. Systolic function was vigorous. The estimated ejection fraction was in the range of 65% to 70%. Wall motion was normal; there were no regional wall motion abnormalities. The transmitral flow pattern was normal. The deceleration time of the early transmitral flow velocity was normal. The pulmonary vein flow pattern was normal. The tissue Doppler parameters were normal. Left ventricular diastolic function parameters were normal.  ------------------------------------------------------------------- Aortic valve:  Poorly visualized. A 23mm Magna Ease Pericardial bioprosthesis is present and functioning normally.  Doppler:  There was no regurgitation. There was no significant perivalvular regurgitation.    VTI ratio of LVOT to aortic valve: 0.51. Peak velocity ratio of LVOT  to aortic valve: 0.52. Mean velocity ratio of LVOT  to aortic valve: 0.49.    Mean gradient (S): 20 mm Hg. Peak gradient (S): 33 mm Hg.  ------------------------------------------------------------------- Aorta:  Aortic root: The aortic root was normal in size.  ------------------------------------------------------------------- Mitral valve:   Structurally normal valve.   Mobility was not restricted.  Doppler:  Transvalvular velocity was within the normal range. There was no evidence for stenosis. There was no regurgitation.    Peak gradient (D): 4 mm Hg.  ------------------------------------------------------------------- Left atrium:  The atrium was mildly dilated.  ------------------------------------------------------------------- Right ventricle:  The cavity size was mildly dilated. Wall thickness was normal. Systolic function was normal.  ------------------------------------------------------------------- Pulmonic valve:    Structurally normal valve.   Cusp separation was normal.  Doppler:  Transvalvular velocity was within the normal range. There was no evidence for stenosis. There was no regurgitation.  ------------------------------------------------------------------- Tricuspid valve:   Structurally normal valve.    Doppler: Transvalvular velocity was within the normal range. There was no regurgitation.  ------------------------------------------------------------------- Pulmonary artery:   The main pulmonary artery was normal-sized. Systolic pressure could not be accurately estimated.  ------------------------------------------------------------------- Right atrium:  The atrium was mildly dilated.  ------------------------------------------------------------------- Pericardium:  There was no pericardial effusion.  ------------------------------------------------------------------- Systemic veins: Inferior vena cava: The vessel was normal in  size.  ------------------------------------------------------------------- Measurements   Left ventricle                         Value        Reference  LV ID, ED, PLAX chordal        (H)     57.9  mm     43 - 52  LV ID, ES, PLAX chordal        (H)     52.9  mm     23 - 38  LV fx shortening, PLAX chordal (L)     9     %      >=29  LV PW thickness, ED                    8.79  mm     ----------  IVS/LV PW ratio, ED                    1.23         <=1.3  LV ejection fraction, 1-p A4C          29    %      ----------  LV e&', lateral                         13.8  cm/s   ----------  LV E/e&', lateral                       6.86         ----------  LV e&', medial                          8.49  cm/s   ----------  LV E/e&', medial                        11.14        ----------  LV e&', average                         11.15 cm/s   ----------  LV E/e&', average                       8.49         ----------    Ventricular septum                     Value        Reference  IVS thickness, ED                      10.8  mm     ----------    LVOT                                   Value        Reference  LVOT peak velocity, S                  150   cm/s   ----------  LVOT mean velocity, S                  100   cm/s   ----------  LVOT VTI, S                            22.8  cm     ----------  LVOT peak gradient, S                  9     mm Hg  ----------    Aortic valve                           Value        Reference  Aortic valve peak velocity, S          287   cm/s   ----------  Aortic valve mean velocity, S          204   cm/s   ----------  Aortic valve VTI, S                    44.6  cm     ----------  Aortic mean gradient, S                20    mm Hg  ----------  Aortic peak gradient, S                33    mm Hg  ----------  VTI ratio, LVOT/AV                     0.51         ----------  Velocity ratio, peak, LVOT/AV          0.52         ----------  Velocity ratio, mean, LVOT/AV           0.49         ----------    Aorta                                  Value        Reference  Aortic root ID, ED  35    mm     ----------    Left atrium                            Value        Reference  LA ID, A-P, ES                         42    mm     ----------  LA ID/bsa, A-P                         2.01  cm/m^2 <=2.2  LA volume, ES, 1-p A4C                 49.4  ml     ----------  LA volume/bsa, ES, 1-p A4C             23.6  ml/m^2 ----------    Mitral valve                           Value        Reference  Mitral E-wave peak velocity            94.6  cm/s   ----------  Mitral A-wave peak velocity            77.6  cm/s   ----------  Mitral deceleration time               158   ms     150 - 230  Mitral peak gradient, D                4     mm Hg  ----------  Mitral E/A ratio, peak                 1.2          ----------    Right atrium                           Value        Reference  RA ID, S-I, ES, A4C            (H)     54.1  mm     34 - 49  RA area, ES, A4C               (H)     22.5  cm^2   8.3 - 19.5  RA volume, ES, A/L                     74.2  ml     ----------  RA volume/bsa, ES, A/L                 35.5  ml/m^2 ----------    Right ventricle                        Value        Reference  TAPSE                                  14.6  mm     ----------  RV s&', lateral, S  9.68  cm/s   ----------  Legend: (L)  and  (H)  mark values outside specified reference range.  ------------------------------------------------------------------- Prepared and Electronically Authenticated by  Armanda Magic, MD 2018-08-08T12:43:01     Xrays No results found.  Assessment/Plan: S/P Procedure(s) (LRB): AORTIC VALVE REPLACEMENT (AVR) USING 23 MM MAGNA EASE PERICARDIAL TISSUE VALVE (N/A) TRANSESOPHAGEAL ECHOCARDIOGRAM (TEE) (N/A) CORONARY ARTERY BYPASS GRAFTING (CABG) USING LIMA TO DISTAL LAD AND ENDOSCOPICALLY HARVESTED GREATER SAPHENOUS  VEIN TO CIRC. (N/A) ENDOVEIN HARVEST OF GREATER SAPHENOUS VEIN (Right)  1 E coli UTI, sens to rocephin, WBC slightly improved, afebrile 2 sinus rhythm on current management 3 H/H stable  4 cont aggressive pulm management as mer CCM management 5 echo results noted above  LOS: 10 days    GOLD,WAYNE E 09/15/2016 Feels better today , wound intact, temp down ecoli in urine being treated  I have seen and examined [name] and agree with the above assessment  and plan.  Delight Ovens MD Beeper [phone] Office [phone] 09/15/2016 8:48 AM

## 2016-09-15 NOTE — Progress Notes (Signed)
Progress Note  Patient Name: [name] Date of Encounter: 09/15/2016  Primary Cardiologist: Dr Purvis Sheffield  Subjective   Dyspnea improved; no chest pain  Inpatient Medications    Scheduled Meds: . amiodarone  200 mg Oral BID  . arformoterol  15 mcg Nebulization BID  . aspirin EC  325 mg Oral Daily   Or  . aspirin  324 mg Per Tube Daily  . atorvastatin  40 mg Oral Daily  . bisacodyl  10 mg Oral Daily   Or  . bisacodyl  10 mg Rectal Daily  . budesonide (PULMICORT) nebulizer solution  0.5 mg Nebulization BID  . Chlorhexidine Gluconate Cloth  6 each Topical Daily  . docusate sodium  200 mg Oral Daily  . enoxaparin (LOVENOX) injection  40 mg Subcutaneous Q24H  . ferrous sulfate  325 mg Oral BID WC  . folic acid  1 mg Oral Daily  . furosemide  40 mg Oral Daily  . ipratropium  0.5 mg Nebulization TID  . loratadine  10 mg Oral Daily  . mouth rinse  15 mL Mouth Rinse BID  . pantoprazole  40 mg Oral Daily  . potassium chloride  40 mEq Oral Daily  . sodium chloride flush  10-40 mL Intracatheter Q12H  . sodium chloride flush  3 mL Intravenous Q12H  . tamsulosin  0.4 mg Oral QPC supper   Continuous Infusions: . sodium chloride    . cefTRIAXone (ROCEPHIN)  IV Stopped (09/14/16 2106)   PRN Meds: sodium chloride, acetaminophen, alum & mag hydroxide-simeth, fluticasone, guaiFENesin-dextromethorphan, levalbuterol, magnesium hydroxide, ondansetron (ZOFRAN) IV, oxyCODONE, sodium chloride flush, sodium chloride flush, traMADol, zolpidem   Vital Signs    Vitals:   09/14/16 2003 09/15/16 0000 09/15/16 0600 09/15/16 0830  BP:  108/61 112/66 120/63  Pulse:  88 95 (!) 103  Resp:  20 20 17   Temp:  97.9 F (36.6 C)    TempSrc:  Oral    SpO2: 96% 93% 98% 96%  Weight:      Height:        Intake/Output Summary (Last 24 hours) at 09/15/16 0912 Last data filed at 09/15/16 0655  Gross per 24 hour  Intake              480 ml  Output              775 ml  Net             -295  ml   Filed Weights   09/12/16 0431 09/13/16 0400 09/14/16 0415  Weight: 88 kg (194 lb 1.6 oz) 88.8 kg (195 lb 12.8 oz) 88.2 kg (194 lb 6.4 oz)    Telemetry    Sinus to sinus tach - Personally Reviewed  Physical Exam   GEN: WD/WN, NAD Neck: supple, no JVD Cardiac: RRR, 1/6 systolic murmur Respiratory: Diminished BS throughout; no wheeze this AM GI: Soft, nontender, non-distended, no masses MS: no edema Neuro:  Grossly intact   Labs    Chemistry Recent Labs Lab 09/09/16 0545  09/13/16 0441 09/14/16 0519 09/15/16 0318  NA 138  < > 133* 131* 133*  K 3.9  < > 4.5 4.5 4.3  CL 99*  < > 94* 93* 94*  CO2 33*  < > 30 30 31   GLUCOSE 117*  < > 113* 122* 118*  BUN 14  < > 14 18 17   CREATININE 0.55*  < > 0.49* 0.65 0.57*  CALCIUM 8.2*  < > 8.7* 8.3*  8.3*  PROT 5.0*  --   --   --   --   ALBUMIN 2.8*  --   --   --   --   AST 20  --   --   --   --   ALT 14*  --   --   --   --   ALKPHOS 36*  --   --   --   --   BILITOT 1.0  --   --   --   --   GFRNONAA >60  < > >60 >60 >60  GFRAA >60  < > >60 >60 >60  ANIONGAP 6  < > 9 8 8   < > = values in this interval not displayed.   Hematology  Recent Labs Lab 09/13/16 0441 09/14/16 0519 09/15/16 0318  WBC 13.3* 21.4* 20.3*  RBC 2.81* 2.65* 2.64*  HGB 8.3* 7.6* 7.6*  HCT 25.8* 23.8* 23.8*  MCV 91.8 89.8 90.2  MCH 29.5 28.7 28.8  MCHC 32.2 31.9 31.9  RDW 13.9 13.6 13.7  PLT 293 323 341     Radiology    Dg Chest Port 1 View  Result Date: 09/15/2016 CLINICAL DATA:  COPD. EXAM: PORTABLE CHEST 1 VIEW COMPARISON:  Multiple prior chest x-rays, most recently dated September 12, 2016. FINDINGS: The left diaphragm and costophrenic angle are excluded from the field-of-view. Right PICC catheter with tip in the distal SVC. Prior CABG. Stable cardiomediastinal silhouette. Normal pulmonary vascularity. Hyperinflation. No focal consolidation, pleural effusion, or pneumothorax. No acute osseous abnormality. IMPRESSION: COPD. No active  cardiopulmonary disease. Please note the left diaphragm and costophrenic angle are not included in the field of view. Electronically Signed   By: Obie DredgeWilliam T Derry M.D.   On: 09/15/2016 08:10    Patient Profile     [name] is a 66 y.o. male with a hx of Hypertension, hyperlipidemia, abdominal aortic aneurysm, COPD and now status post recent (09/06/16) aortic valve replacement as well as coronary artery bypass and graft who is being seen for the evaluation of atrial flutter.  Assessment & Plan    1. Atrial flutter-Remains in sinus. Likely postoperative from recent coronary artery bypass graft/aortic valve replacement. Continue amiodarone 200 mg twice a day for 2 weeks and then 200 mg daily thereafter. Would continue for 8 weeks and if patient holds sinus rhythm discontinue at that time. Will avoid amiodarone long term given baseline lung disease. However I think it would be reasonable to treat short-term for postoperative atrial arrhythmias. CHADSvasc 2. However I think this was likely postoperative atrial arrhythmia and therefore does not require long-term anticoagulation.  2. Dyspnea-improved. CTA showed no pulmonary embolus. There is severe COPD. His pulmonary status was likely exacerbated by postoperative volume excess which has improved. Continue present dose of lasix. Continue pulmonary toilet.   3. S/p AVR/CABG-plan to continue aspirin and statin. Patient will require baseline echocardiogram status post aortic valve replacement in approximately 3 months.  4. AAA-Patient will require follow-up abdominal ultrasound June 2019.  5. Lung nodule-noted on CT scan December 2017; patient will need follow-up chest CT December 2018.  6. Hypertension -blood pressure controlled.  7. Hyperlipidemia-continue statin.   8. COPD-Management per pulmonary  Signed, Olga MillersBrian Crenshaw, MD  09/15/2016, 9:12 AM

## 2016-09-16 ENCOUNTER — Telehealth: Payer: Self-pay | Admitting: Cardiology

## 2016-09-16 ENCOUNTER — Inpatient Hospital Stay (HOSPITAL_COMMUNITY): Payer: BLUE CROSS/BLUE SHIELD

## 2016-09-16 DIAGNOSIS — I480 Paroxysmal atrial fibrillation: Secondary | ICD-10-CM

## 2016-09-16 DIAGNOSIS — I4891 Unspecified atrial fibrillation: Secondary | ICD-10-CM

## 2016-09-16 DIAGNOSIS — Z952 Presence of prosthetic heart valve: Secondary | ICD-10-CM

## 2016-09-16 LAB — CBC
HCT: 23.3 % — ABNORMAL LOW (ref 39.0–52.0)
Hemoglobin: 7.6 g/dL — ABNORMAL LOW (ref 13.0–17.0)
MCH: 28.8 pg (ref 26.0–34.0)
MCHC: 32.6 g/dL (ref 30.0–36.0)
MCV: 88.3 fL (ref 78.0–100.0)
Platelets: 345 10*3/uL (ref 150–400)
RBC: 2.64 MIL/uL — ABNORMAL LOW (ref 4.22–5.81)
RDW: 14 % (ref 11.5–15.5)
WBC: 9.2 10*3/uL (ref 4.0–10.5)

## 2016-09-16 LAB — BASIC METABOLIC PANEL
Anion gap: 9 (ref 5–15)
BUN: 14 mg/dL (ref 6–20)
CO2: 29 mmol/L (ref 22–32)
Calcium: 8.3 mg/dL — ABNORMAL LOW (ref 8.9–10.3)
Chloride: 93 mmol/L — ABNORMAL LOW (ref 101–111)
Creatinine, Ser: 0.6 mg/dL — ABNORMAL LOW (ref 0.61–1.24)
GFR calc Af Amer: >60 mL/min (ref >60)
GFR calc non Af Amer: >60 mL/min (ref >60)
Glucose, Bld: 119 mg/dL — ABNORMAL HIGH (ref 65–99)
Potassium: 3.8 mmol/L (ref 3.5–5.1)
Sodium: 131 mmol/L — ABNORMAL LOW (ref 135–145)

## 2016-09-16 MED ORDER — ASPIRIN 81 MG PO CHEW: 81.0000 mg | CHEWABLE_TABLET | Freq: Every day | ORAL | Status: DC

## 2016-09-16 MED ORDER — IPRATROPIUM BROMIDE 0.02 % IN SOLN
0.5000 mg | Freq: Two times a day (BID) | RESPIRATORY_TRACT | Status: DC
  Administered 2016-09-16 – 2016-09-20 (×8): 0.5 mg via RESPIRATORY_TRACT
  Filled 2016-09-16 (×8): qty 2.5

## 2016-09-16 MED ORDER — DILTIAZEM HCL 30 MG PO TABS
30.0000 mg | ORAL_TABLET | Freq: Four times a day (QID) | ORAL | Status: DC
Start: 2016-09-16 — End: 2016-09-17
  Administered 2016-09-16 – 2016-09-17 (×4): 30 mg via ORAL
  Filled 2016-09-16 (×4): qty 1

## 2016-09-16 MED ORDER — APIXABAN 5 MG PO TABS
5.0000 mg | ORAL_TABLET | Freq: Two times a day (BID) | ORAL | Status: DC
  Administered 2016-09-16 – 2016-09-20 (×8): 5 mg via ORAL
  Filled 2016-09-16 (×8): qty 1

## 2016-09-16 MED ORDER — ASPIRIN EC 81 MG PO TBEC
81.0000 mg | DELAYED_RELEASE_TABLET | Freq: Every day | ORAL | Status: DC
  Administered 2016-09-17 – 2016-09-20 (×4): 81 mg via ORAL
  Filled 2016-09-16 (×4): qty 1

## 2016-09-16 NOTE — Discharge Summary (Signed)
Physician Discharge Summary  Patient ID: NICKLOUS ABURTO MRN: [id number] DOB/AGE: [birth year] 66 y.o.  Admit date: 09/05/2016 Discharge date: 09/20/2016  Admission Diagnoses:severe aortic stenosis and coronary artery disease  Discharge Diagnoses:  Active Problems:   S/P AVR   S/P CABG x 2   Atrial fibrillation (HCC) UTI (E. Coli)  Patient Active Problem List   Diagnosis Date Noted  . Atrial fibrillation (HCC) 09/16/2016  . S/P AVR 09/05/2016  . S/P CABG x 2 09/05/2016  . Severe aortic stenosis 08/29/2016  . Seizure (HCC) 04/13/2016  . NSTEMI (non-ST elevated myocardial infarction) (HCC) 02/01/2016  . Acute non Q wave myocardial infarction (HCC) 02/01/2016  . Fatty liver 12/09/2014  . AAA (abdominal aortic aneurysm) without rupture (HCC) 12/09/2014  . Elevated liver enzymes 12/01/2014  . Diverticulosis of colon without hemorrhage   . History of colonic polyps   . Other hemorrhoids   . Encounter for screening colonoscopy 04/16/2014  . Prediabetes 11/27/2013  . Hyperlipidemia 05/02/2013  . COPD (chronic obstructive pulmonary disease) (HCC) 06/14/2012  . Elevated blood pressure reading in office with white coat syndrome, with diagnosis of hypertension 06/14/2012    History of Present Illness:    RIDLEY SCHEWE 66 y.o. male  Seen in the office for Evaluation of aortic stenosis. In late December 2017 the patient went to the emergency room with pneumonia symptoms, cough shortness of breath and chest discomfort. Initially went [facility] and when troponins were elevated to 1.640 was transferred [facility] and underwent cardiac catheterization. On exam and echocardiogram he was noted to have critical aortic stenosis. In addition he has a 4.7 cm abdominal aortic aneurysm last measured on 07/28/2016 ultrasound.  The patient comes in now noting increasing symptoms he notes increasing chest discomfort and shortness of breath with exertion over the past several months, he denies  syncope or near-syncope.  He has a previous smoker but quit 15 years ago   He was admitted for elective aortic valve replacement and coronary artery bypass grafting.  Discharged Condition: good  Hospital Course: the patient was embedded electively and on 09/05/2016 taken to the operating room where she underwent the below described procedure. He tolerated the procedure well and  was taken to the surgical intensive care unit in stable condition.  Postoperative hospital course:  The patient has made very slow progress.he initially did require some inotropic support but this was weaned without significant difficulty. He was extubated using standard protocols without significant difficulty but has had significant postoperative pulmonary difficulty due to his significant emphysema.we have used multiple agents and he is making slow yet steady progress. Pulmonary medicine has assisted with management. Additionally, he had postoperative atrial fibrillation but has been chemically converted to sinus rhythm. As of 09/18/2016, he is on Amiodarone 200 mg bid and Cardizem CD 120 mg daily. He is also felt to require apixiban due to PAF. He has expected acute blood loss anemia and values are low yet stable. His most recent H/H was 8.3 and 25.6. His most recent renal function was 0.49. He has required some diuretic for postoperative volume overload. He was found to have an E. Coli UTI and was placed on Rocephin. We avoided Cipro as he has a 4.7 AAA and is followed by Dr Darrick Penna. Blood sugars have been under adequate control using standard treatments. During the postoperative period, a CT scan of his chest for pulmonary embolism was performed to rule it out. Study was negative for pulmonary embolism. Oxygen was attempted to be  weaned, but he still was requiring 2-3 liters of oxygen via [place] because of desaturation. Incisions are noted be healing well without evidence of infection. He is tolerating diet and has had a bowel  movement. He is tolerating gradually improving activity level using cardiac rehabilitation. At time of discharge, the patient is felt to be quite stable.  Consults: cardiology and pulmonary/intensive care  Significant Diagnostic Studies: chest CT scan to r/o PE  Treatments: surgery:  PHYSICIAN:  Sheliah PlaneEdward Gerhardt, MD    DATE OF BIRTH:  December 20, 1950  DATE OF PROCEDURE:  09/05/2016 DATE OF DISCHARGE:                              OPERATIVE REPORT   PREOPERATIVE DIAGNOSIS:  Critical aortic stenosis and coronary occlusive disease.  POSTOPERATIVE DIAGNOSIS:  Critical aortic stenosis and coronary occlusive disease.  SURGICAL PROCEDURE:  Coronary artery bypass grafting x2, with left internal mammary to the distal left anterior descending coronary artery and reverse saphenous vein graft to the circumflex coronary artery and aortic valve replacement with a pericardial tissue valve, [employer] 23-mm, model 3300TFX, serial number [id number]. Right Thigh endovein harvesting of right Greater Saphenous Vein  SURGEON:  Sheliah PlaneEdward Gerhardt, MD.  FIRST ASSISTANT:  Erin Barrett PA-C  Discharge Exam: Blood pressure (!) 112/56, pulse 75, temperature 98.3 F (36.8 C), temperature source Oral, resp. rate (!) 21, height 5\' 9"  (1.753 m), weight 188 lb 4.8 oz (85.4 kg), SpO2 99 %.  Cardiovascular: RRR Pulmonary: Diminished breath sounds at bases. Abdomen: Soft, non tender, bowel sounds present. Extremities: Mild bilateral lower extremity edema. Wounds: Clean and dry.  No erythema or signs of infection.  Disposition: 01-Home or Self Care  Discharge Instructions    Amb Referral to Cardiac Rehabilitation    Complete by:  As directed    Diagnosis:   CABG Valve Replacement     Valve:  Aortic   CABG X ___:  2     Allergies as of 09/20/2016      Reactions   Ciprofloxacin       Medication List    STOP taking these medications   acetaminophen 500 MG tablet Commonly known as:   TYLENOL   ADVAIR DISKUS 100-50 MCG/DOSE Aepb Generic drug:  Fluticasone-Salmeterol   clopidogrel 75 MG tablet Commonly known as:  PLAVIX   lisinopril-hydrochlorothiazide 20-12.5 MG tablet Commonly known as:  ZESTORETIC   metoprolol tartrate 25 MG tablet Commonly known as:  LOPRESSOR   nitroGLYCERIN 0.4 MG SL tablet Commonly known as:  NITROSTAT     TAKE these medications   albuterol 108 (90 Base) MCG/ACT inhaler Commonly known as:  VENTOLIN HFA INHALE TWO PUFFS BY MOUTH EVERY 4 HOURS AS NEEDED What changed:  how much to take  how to take this  when to take this  reasons to take this  additional instructions   amiodarone 200 MG tablet Commonly known as:  PACERONE Take 1 tablet (200 mg total) by mouth daily.   apixaban 5 MG Tabs tablet Commonly known as:  ELIQUIS Take 1 tablet (5 mg total) by mouth 2 (two) times daily.   arformoterol 15 MCG/2ML Nebu Commonly known as:  BROVANA Take 2 mLs (15 mcg total) by nebulization 2 (two) times daily.   aspirin 81 MG tablet Take 81 mg by mouth at bedtime.   atorvastatin 40 MG tablet Commonly known as:  LIPITOR Take 1 tablet (40 mg total) by mouth daily.  budesonide 0.5 MG/2ML nebulizer solution Commonly known as:  PULMICORT Take 2 mLs (0.5 mg total) by nebulization 2 (two) times daily.   diltiazem 120 MG 24 hr capsule Commonly known as:  CARDIZEM CD Take 1 capsule (120 mg total) by mouth daily.   ferrous sulfate 325 (65 FE) MG tablet Take 1 tablet (325 mg total) by mouth 2 (two) times daily with a meal.   fexofenadine 180 MG tablet Commonly known as:  ALLEGRA Take 180 mg by mouth daily.   folic acid 1 MG tablet Commonly known as:  FOLVITE Take 1 tablet (1 mg total) by mouth daily.   furosemide 40 MG tablet Commonly known as:  LASIX Take 1 tablet (40 mg total) by mouth daily.   ipratropium 0.02 % nebulizer solution Commonly known as:  ATROVENT Take 2.5 mLs (0.5 mg total) by nebulization 2 (two) times  daily.   levalbuterol 0.63 MG/3ML nebulizer solution Commonly known as:  XOPENEX Take 3 mLs (0.63 mg total) by nebulization every 3 (three) hours as needed for wheezing or shortness of breath.   mometasone 50 MCG/ACT nasal spray Commonly known as:  NASONEX Place 2 sprays into the nose daily as needed (for allergies). What changed:  how much to take   multivitamin tablet Take 1 tablet by mouth daily.   oxyCODONE 5 MG immediate release tablet Commonly known as:  Oxy IR/ROXICODONE Take 1-2 tablets (5-10 mg total) by mouth every 6 (six) hours as needed for severe pain.   potassium chloride SA 20 MEQ tablet Commonly known as:  K-DUR,KLOR-CON Take 1 tablet (20 mEq total) by mouth daily.   tamsulosin 0.4 MG Caps capsule Commonly known as:  FLOMAX Take 1 capsule (0.4 mg total) by mouth daily after supper.   tetrahydrozoline 0.05 % ophthalmic solution Place 2 drops into both eyes daily as needed (dry, red eye).   tiotropium 18 MCG inhalation capsule Commonly known as:  SPIRIVA HANDIHALER Place 1 capsule (18 mcg total) into inhaler and inhale daily.   vitamin C 500 MG tablet Commonly known as:  ASCORBIC ACID Take 500 mg by mouth daily. Reported on 07/16/2015            Discharge Care Instructions        Start     Ordered   09/19/16 0000  amiodarone (PACERONE) 200 MG tablet  Daily    Question:  Supervising Provider  Answer:  Delight Ovens   09/19/16 0806   09/19/16 0000  apixaban (ELIQUIS) 5 MG TABS tablet  2 times daily    Question:  Supervising Provider  Answer:  Delight Ovens   09/19/16 0806   09/19/16 0000  arformoterol (BROVANA) 15 MCG/2ML NEBU  2 times daily    Question:  Supervising Provider  Answer:  Sheliah Plane B   09/19/16 0806   09/19/16 0000  budesonide (PULMICORT) 0.5 MG/2ML nebulizer solution  2 times daily    Question:  Supervising Provider  Answer:  Sheliah Plane B   09/19/16 0806   09/19/16 0000  diltiazem (CARDIZEM CD) 120 MG 24 hr  capsule  Daily    Question:  Supervising Provider  Answer:  Sheliah Plane B   09/19/16 0806   09/19/16 0000  ferrous sulfate 325 (65 FE) MG tablet  2 times daily with meals    Question:  Supervising Provider  Answer:  Sheliah Plane B   09/19/16 0806   09/19/16 0000  folic acid (FOLVITE) 1 MG tablet  Daily  Question:  Supervising Provider  Answer:  Sheliah Plane B   09/19/16 0806   09/19/16 0000  furosemide (LASIX) 40 MG tablet  Daily    Question:  Supervising Provider  Answer:  Sheliah Plane B   09/19/16 0806   09/19/16 0000  ipratropium (ATROVENT) 0.02 % nebulizer solution  2 times daily    Question:  Supervising Provider  Answer:  Sheliah Plane B   09/19/16 0806   09/19/16 0000  levalbuterol (XOPENEX) 0.63 MG/3ML nebulizer solution  Every  3 hours PRN    Question:  Supervising Provider  Answer:  Sheliah Plane B   09/19/16 0806   09/19/16 0000  oxyCODONE (OXY IR/ROXICODONE) 5 MG immediate release tablet  Every 6 hours PRN     09/19/16 0806   09/19/16 0000  potassium chloride SA (K-DUR,KLOR-CON) 20 MEQ tablet  Daily    Question:  Supervising Provider  Answer:  Delight Ovens   09/19/16 0806   09/19/16 0000  tamsulosin (FLOMAX) 0.4 MG CAPS capsule  Daily after supper    Question:  Supervising Provider  Answer:  Delight Ovens   09/19/16 0806   09/19/16 0000  Amb Referral to Cardiac Rehabilitation    Question Answer Comment  Diagnosis: CABG   Diagnosis: Valve Replacement   Valve: Aortic   CABG X ___ 2      09/19/16 1536     The patient has been discharged on:   1.Beta Blocker:  Yes [ n  ]                              No   [   ]                              If No, reason:Severe COPD  2.Ace Inhibitor/ARB: Yes [   ]                                     No  [    ]                                     If No, reason:BP will not tolerate with other current HTN meds- BP would be too low  3.Statin:   Yes Cove.Etienne   ]                  No  [   ]                   If No, reason:  4.Ecasa:  Yes  Cove.Etienne   ]                  No   [   ]                  If No, reason:    Follow-up Information    Delight Ovens, MD Follow up.   Specialty:  Cardiothoracic Surgery Why:  appointment to see Dr. Tyrone Sage on 10/20/2016 at 12:30 PM. Please obtain a chest x-ray [facility] imaging at 12 noon. [facility] is located in the same office complex. Contact information: [address] [employer] [address] [place] [address] [phone]  Kari Baars, MD. Schedule an appointment as soon as possible for a visit.   Specialty:  Pulmonary Disease Why:  10/20/16 at 10:00am to see pulmonologist Contact information: [address] [place] [address] [phone]        Laqueta Linden, MD Follow up on 10/05/2016.   Specialty:  Cardiology Why:  Appointment time is at 9:20 am Contact information: [address] [place] [address] [phone]        [facility] - Dme Follow up.   Why:  Home 02 arranged- portable tank to be delivered to room prior to discharge- Rolling walker and nebulizer also arranged and to be delivered to room prior to discharge.  Contact information: [address] [place] [address] [phone]           Signed: Ardelle Balls PA-C 09/30/2016, 1:45 PM

## 2016-09-16 NOTE — Progress Notes (Signed)
CARDIAC REHAB PHASE I   PRE:  Rate/Rhythm: 97 SR  BP:  Sitting: 132/65        SaO2: 95 2.5 L  MODE:  Ambulation: 380 ft   POST:  Rate/Rhythm: 115 ST  BP:  Sitting: 142/68         SaO2: 89-92 on 4L during ambulation, 85% on 4L seated upon return to room, 93 on 2.5 L   after 2 minutes rest  Pt sats 88% on 3L O2 with standing, increased O2 to 4L. Pt ambulated 380 ft on 4L O2, IV, rolling walker, gait belt, assist x2 for safety/equiptment, steady gait, tolerated fairly well. Pt c/o DOE, fatigue with distance, standing rest x1. O2 sats 89-92% on 4L O2 during ambulation, monitored with probe on ear during entire walk. Pt sats 85% on 4L upon return to room while sitting, increased to 91-93% on 4L after 2 minutes rest, sats O2 decreased to 2.5 L, sats stable at 93% on 2.5 L O2 at rest. Encouraged IS, additional ambulation x2 today. Pt to recliner after walk, call bell within reach. Will follow.   [id number] Joylene GrapesEmily C Shahd Occhipinti, RN, BSN 09/16/2016 10:33 AM

## 2016-09-16 NOTE — Progress Notes (Addendum)
Pt pain adequately controlled this shift with PRN medication x 2 (tramadol/oxycodone) and pt was able to move his bowels earlier during the shift. VSS, no elevated temp or increased BP/HR.

## 2016-09-16 NOTE — Care Management Note (Addendum)
Case Management Note  Patient Details  Name: [name] MRN: [id number] Date of Birth: February 02, 1951  Subjective/Objective:                 S/P CABG x 2, AVR, self resolved A fib per progression report. New COPD. From home w wife, independent pta. Anticipate oxygen needs at DC as well [facility] and possibly PT.    Action/Plan:  CM will continue to follow and offer HH choice today, [facility] [place] [facility] [place] [facility] He stated he needed some time to look over it prior to deciding. [facility] if needed at time of DC. Will need saturations with in 24 hours of DC.   [facility].  Expected Discharge Date:                  Expected Discharge Plan:     In-House Referral:     Discharge planning Services  CM Consult  Post Acute Care Choice:    Choice offered to:     DME Arranged:  Oxygen DME Agency:     HH Arranged:    HH Agency:     Status of Service:  In process, will continue to follow  If discussed at Long Length of Stay Meetings, dates discussed:    Additional Comments:  Lawerance SabalDebbie Kingsly Kloepfer, RN 09/16/2016, 11:00 AM

## 2016-09-16 NOTE — Progress Notes (Signed)
Progress Note  Patient Name: [name] Date of Encounter: 09/16/2016  Primary Cardiologist: Dr Purvis SheffieldKoneswaran  Subjective   More dyspneic this AM; chest tight  Inpatient Medications    Scheduled Meds: . amiodarone  200 mg Oral BID  . arformoterol  15 mcg Nebulization BID  . aspirin EC  325 mg Oral Daily   Or  . aspirin  324 mg Per Tube Daily  . atorvastatin  40 mg Oral Daily  . bisacodyl  10 mg Oral Daily   Or  . bisacodyl  10 mg Rectal Daily  . budesonide (PULMICORT) nebulizer solution  0.5 mg Nebulization BID  . Chlorhexidine Gluconate Cloth  6 each Topical Daily  . docusate sodium  200 mg Oral Daily  . enoxaparin (LOVENOX) injection  40 mg Subcutaneous Q24H  . ferrous sulfate  325 mg Oral BID WC  . folic acid  1 mg Oral Daily  . furosemide  40 mg Oral Daily  . ipratropium  0.5 mg Nebulization TID  . loratadine  10 mg Oral Daily  . mouth rinse  15 mL Mouth Rinse BID  . pantoprazole  40 mg Oral Daily  . potassium chloride  40 mEq Oral Daily  . sodium chloride flush  10-40 mL Intracatheter Q12H  . sodium chloride flush  3 mL Intravenous Q12H  . tamsulosin  0.4 mg Oral QPC supper   Continuous Infusions: . sodium chloride    . cefTRIAXone (ROCEPHIN)  IV Stopped (09/15/16 2156)   PRN Meds: sodium chloride, acetaminophen, alum & mag hydroxide-simeth, fluticasone, guaiFENesin-dextromethorphan, levalbuterol, magnesium hydroxide, ondansetron (ZOFRAN) IV, oxyCODONE, sodium chloride flush, sodium chloride flush, traMADol, zolpidem   Vital Signs    Vitals:   09/16/16 0025 09/16/16 0227 09/16/16 0455 09/16/16 0832  BP: 127/65  120/76   Pulse: 99  (!) 145 100  Resp: (!) 25  17 19   Temp: (!) 100.7 F (38.2 C) (!) 100.5 F (38.1 C) 98.9 F (37.2 C)   TempSrc: Oral  Oral   SpO2: 94%  99% 91%  Weight:   89.8 kg (198 lb)   Height:        Intake/Output Summary (Last 24 hours) at 09/16/16 0842 Last data filed at 09/16/16 0228  Gross per 24 hour  Intake               120 ml  Output              600 ml  Net             -480 ml   Filed Weights   09/13/16 0400 09/14/16 0415 09/16/16 0455  Weight: 88.8 kg (195 lb 12.8 oz) 88.2 kg (194 lb 6.4 oz) 89.8 kg (198 lb)    Telemetry    Sinus to sinus tach with brief PAF - Personally Reviewed  Physical Exam   GEN: WD/WN, mildly dyspneic Neck: Supple Cardiac: RRR, 1/6 systolic murmur, no diastolic mumur Respiratory: Diminished BS throughout; mild wheeze GI: Soft, nontender, non-distended, + BS MS: trace edema Neuro:  No focal findings   Labs    Chemistry  Recent Labs Lab 09/14/16 0519 09/15/16 0318 09/16/16 0520  NA 131* 133* 131*  K 4.5 4.3 3.8  CL 93* 94* 93*  CO2 30 31 29   GLUCOSE 122* 118* 119*  BUN 18 17 14   CREATININE 0.65 0.57* 0.60*  CALCIUM 8.3* 8.3* 8.3*  GFRNONAA >60 >60 >60  GFRAA >60 >60 >60  ANIONGAP 8 8 9  Hematology  Recent Labs Lab 09/14/16 0519 09/15/16 0318 09/16/16 0520  WBC 21.4* 20.3* 9.2  RBC 2.65* 2.64* 2.64*  HGB 7.6* 7.6* 7.6*  HCT 23.8* 23.8* 23.3*  MCV 89.8 90.2 88.3  MCH 28.7 28.8 28.8  MCHC 31.9 31.9 32.6  RDW 13.6 13.7 14.0  PLT 323 341 345     Radiology    Dg Chest Port 1 View  Result Date: 09/15/2016 CLINICAL DATA:  COPD. EXAM: PORTABLE CHEST 1 VIEW COMPARISON:  Multiple prior chest x-rays, most recently dated September 12, 2016. FINDINGS: The left diaphragm and costophrenic angle are excluded from the field-of-view. Right PICC catheter with tip in the distal SVC. Prior CABG. Stable cardiomediastinal silhouette. Normal pulmonary vascularity. Hyperinflation. No focal consolidation, pleural effusion, or pneumothorax. No acute osseous abnormality. IMPRESSION: COPD. No active cardiopulmonary disease. Please note the left diaphragm and costophrenic angle are not included in the field of view. Electronically Signed   By: Obie Dredge M.D.   On: 09/15/2016 08:10    Patient Profile     [name] is a 66 y.o. male with a hx of  Hypertension, hyperlipidemia, abdominal aortic aneurysm, COPD and now status post recent (09/06/16) aortic valve replacement as well as coronary artery bypass and graft who is being seen for the evaluation of atrial flutter.  Assessment & Plan    1. Atrial fibrillation/Atrial flutter-Pt with episode of atrial fibrillation this AM now back in sinus. Likely postoperative from recent coronary artery bypass graft/aortic valve replacement but also at increase risk for atrial arrhythmias due to pulmonary disease. Continue amiodarone 200 mg twice a day for full 2 weeks and then 200 mg daily thereafter. Would continue for 8-12 weeks and if patient holds sinus rhythm discontinue at that time. Will avoid amiodarone long term given baseline lung disease. However I think it would be reasonable to treat short-term for postoperative atrial arrhythmias. CHADSvasc 2. We are now 11 days postop and I am concerned that he may be having PAF related to lung disease. Would begin apixaban 5 mg BID when ok with surgery (pt had bioprosthetic aortic valve and not mechanical valve and I therefore think apixaban reasonable). When apixaban started would decrease ASA to 81 mg daily. Will avoid beta blocker due to COPD. Add cardizem 30 mg q 6; transition to CD in AM if he tolerates.  2. Dyspnea-CTA showed no pulmonary embolus. There is severe COPD. His pulmonary status was likely exacerbated by postoperative volume excess. Continue present dose of lasix. Continue pulmonary toilet.   3. S/p AVR/CABG-plan to continue aspirin and statin. Patient will require baseline echocardiogram status post aortic valve replacement in approximately 3 months.  4. AAA-Patient will require follow-up abdominal ultrasound June 2019.  5. Lung nodule-noted on CT scan December 2017; patient will need follow-up chest CT December 2018.  6. Hypertension -blood pressure controlled. Watch with addition of cardizem.  7. Hyperlipidemia-continue statin.    8. COPD-Management per pulmonary  Signed, Olga Millers, MD  09/16/2016, 8:42 AM

## 2016-09-16 NOTE — Progress Notes (Signed)
Pacing wires removed per order. Patient instructed on bedrest and q15 VS initiated x 1 hr. 

## 2016-09-16 NOTE — Progress Notes (Signed)
Patient's Foley catheter removed by Jola SchmidtElizabeth Muska RN without difficulty.  Will continue to monitor.

## 2016-09-16 NOTE — Progress Notes (Signed)
Pt converted to Afib RVR at 0439. EKG obtained VSS. Pt then converted by to NSR at 0456. Will continue to monitor. Gregor HamsAlisha Cliffard Hair, RN

## 2016-09-16 NOTE — Progress Notes (Signed)
Name: [name] MRN: [id number] DOB: [birth year]    ADMISSION DATE:  09/05/2016 CONSULTATION DATE:  09/13/16  REFERRING MD :  Tyrone Sage  CHIEF COMPLAINT:  COPD management   HISTORY OF PRESENT ILLNESS:  [name] is a 66 y.o. male who recently underwent AVR and CABG x 2 on 09/06/16.  He had done well post operatively with the exception of developing A.flutter with RVR on 8/6.  Cardiology was consulted, he was paced out back to NSR and was also started on amiodarone with successful maintenance of NSR. That same day, he developed dyspnea which he felt was due to COPD.  He had CTA obtained which was negative for PE but did demonstrate centrilobular emphysema with small pleural effusions.  On 8/7, PCCM was asked to see in consultation for ongoing COPD management.  He had PFT's 7/26 which were as follows:  FVC 68% pred, FEV1 1.08 (32% pred), ratio 35 (47% pred).  DLCO 40% (55% when correct for alveolar volume).  No significant change after BD's. He has roughly a 40 pack year smoking history and quit roughly 14 years ago.  He has not seen or does not follow with a pulmonologist.  His outpatient medication list includes advair, albuterol PRN, spiriva.  He used advair and spiriva daily and albuterol only maybe every other day or two.  SUBJECTIVE:  Pt reports improvement in SOB.  Walked with cardiac rehab > RN reports he needs 2.5L at rest and 4L with exertion.     VITAL SIGNS: Temp:  [98.6 F (37 C)-100.7 F (38.2 C)] 98.6 F (37 C) (08/10 0853) Pulse Rate:  [97-145] 100 (08/10 0832) Resp:  [17-25] 19 (08/10 0832) BP: (120-127)/(65-76) 120/76 (08/10 0455) SpO2:  [89 %-99 %] 91 % (08/10 0832) FiO2 (%):  [92 %] 92 % (08/09 1430) Weight:  [198 lb (89.8 kg)] 198 lb (89.8 kg) (08/10 0455)  PHYSICAL EXAMINATION: General: chronically ill appearing male in NAD  HEENT: MM pink/moist, no jvd PSY: calm/appropriate Neuro: AAOx4, speech clear, MAE, ambulates with walker CV: s1s2 rrr, 1/6  systolic murmur PULM: even/non-labored, lungs bilaterally diminished throughout NW:GNFA, non-tender, bsx4 active  Extremities: warm/dry, trace LE edema  Skin: no rashes or lesions   Recent Labs Lab 09/14/16 0519 09/15/16 0318 09/16/16 0520  NA 131* 133* 131*  K 4.5 4.3 3.8  CL 93* 94* 93*  CO2 30 31 29   BUN 18 17 14   CREATININE 0.65 0.57* 0.60*  GLUCOSE 122* 118* 119*    Recent Labs Lab 09/14/16 0519 09/15/16 0318 09/16/16 0520  HGB 7.6* 7.6* 7.6*  HCT 23.8* 23.8* 23.3*  WBC 21.4* 20.3* 9.2  PLT 323 341 345   Dg Chest 2 View  Result Date: 09/16/2016 CLINICAL DATA:  Hypertension. Status post coronary artery bypass grafting EXAM: CHEST  2 VIEW COMPARISON:  September 15, 2016 FINDINGS: Central catheter tip is in the superior vena cava. No pneumothorax. Temporary pacemaker wires are attached to the right heart. There is upper lobe bullous disease, more severe on the right than on the left. There is scarring in the lung bases. There is a small left pleural effusion. There is no edema or consolidation. Patient is status post aortic valve replacement and coronary artery bypass grafting. Heart is borderline enlarged with pulmonary vascularity within normal limits. No adenopathy. No bone lesions. IMPRESSION: No pneumothorax. Bibasilar scarring. Bullous disease in the upper lobes, more severe on the right than on the left. No edema or consolidation. Areas of postoperative change. Heart borderline  enlarged, stable. Electronically Signed   By: Bretta BangWilliam  Woodruff III M.D.   On: 09/16/2016 09:36   Dg Chest Port 1 View  Result Date: 09/15/2016 CLINICAL DATA:  COPD. EXAM: PORTABLE CHEST 1 VIEW COMPARISON:  Multiple prior chest x-rays, most recently dated September 12, 2016. FINDINGS: The left diaphragm and costophrenic angle are excluded from the field-of-view. Right PICC catheter with tip in the distal SVC. Prior CABG. Stable cardiomediastinal silhouette. Normal pulmonary vascularity. Hyperinflation. No  focal consolidation, pleural effusion, or pneumothorax. No acute osseous abnormality. IMPRESSION: COPD. No active cardiopulmonary disease. Please note the left diaphragm and costophrenic angle are not included in the field of view. Electronically Signed   By: Obie DredgeWilliam T Derry M.D.   On: 09/15/2016 08:10    STUDIES:  CTA chest 8/6 >> negative for PE but did demonstrate centrilobular emphysema with small pleural effusions. Echo 8/6 >> PFT's 7/26 >> FVC 68% pred, FEV1 1.08 (32% pred), ratio 35 (47% pred).  DLCO 40% (55% when correct for alveolar volume).  No significant change after BD's.  SIGNIFICANT EVENTS  7/31  AVR and CABG x 2. 8/07  PCCM consulted for dyspnea   CULTURES:  [facility] 8/7 >> UA >> moderate leuk, protein 100, numerous WBC UC 8/7 >> E-Coli >> pan sensitive  Sputum cx 8/8 >>  ANTIBIOTICS: Ceftriaxone 8/7 >>  ASSESSMENT / PLAN:  Acute hypoxic respiratory failure - multifactorial likely due to underlying COPD, new A.flutter, volume overload post op, atelectasis.  Can not rule out chronic component. 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 Former smoker  Plan: O2 as needed to support sats 88-95% He will need O2 at 2.5L at rest, 4L with exertion at discharge (DME O2 ordered) Pulmonary hygiene Continue Brovana + Pulmicort BID  Xopenex + Atrovent  Return to home Advair, allegra, nasonex, spiriva and PRN albuterol at discharge Follow up with Dr. Juanetta GoslingHawkins as outpatient > will need to be arranged  He will need work up for A1AT as outpatient   E-Coli UTI   Plan: Continue rocephin, D3/7 Can transition to Cipro for completion at discharge   PCCM will sign off.  Please call back if new needs arise.   Canary BrimBrandi Stellar Gensel, NP-C [facility] Pgr: [phone] or if no answer [phone] 09/16/2016, 10:25 AM

## 2016-09-16 NOTE — Progress Notes (Addendum)
[name]       [place] [id number]             [phone]      11 Days Post-Op Procedure(s) (LRB): AORTIC VALVE REPLACEMENT (AVR) USING 23 MM MAGNA EASE PERICARDIAL TISSUE VALVE (N/A) TRANSESOPHAGEAL ECHOCARDIOGRAM (TEE) (N/A) CORONARY ARTERY BYPASS GRAFTING (CABG) USING LIMA TO DISTAL LAD AND ENDOSCOPICALLY HARVESTED GREATER SAPHENOUS VEIN TO CIRC. (N/A) ENDOVEIN HARVEST OF GREATER SAPHENOUS VEIN (Right) Subjective: Felt SOB when went back into rapid afib for short time, otherwise yesterday  was the best day he's felt.   Objective: Vital signs in last 24 hours: Temp:  [98.9 F (37.2 C)-100.7 F (38.2 C)] 98.9 F (37.2 C) (08/10 0455) Pulse Rate:  [97-145] 145 (08/10 0455) Cardiac Rhythm: Normal sinus rhythm (08/10 0456) Resp:  [17-25] 17 (08/10 0455) BP: (120-127)/(63-76) 120/76 (08/10 0455) SpO2:  [89 %-99 %] 99 % (08/10 0455) FiO2 (%):  [92 %] 92 % (08/09 1430) Weight:  [198 lb (89.8 kg)] 198 lb (89.8 kg) (08/10 0455)  Hemodynamic parameters for last 24 hours:    Intake/Output from previous day: 08/09 0701 - 08/10 0700 In: 120 [P.O.:120] Out: 600 [Urine:600] Intake/Output this shift: No intake/output data recorded.  General appearance: alert, cooperative and no distress Heart: regular rate and rhythm and tachy Lungs: slight expiratory prolongation, fair air exchange throughout Abdomen: benign Extremities: + LE edema Wound: incis healing well  Lab Results:  Recent Labs  09/15/16 0318 09/16/16 0520  WBC 20.3* 9.2  HGB 7.6* 7.6*  HCT 23.8* 23.3*  PLT 341 345   BMET:  Recent Labs  09/15/16 0318 09/16/16 0520  NA 133* 131*  K 4.3 3.8  CL 94* 93*  CO2 31 29  GLUCOSE 118* 119*  BUN 17 14  CREATININE 0.57* 0.60*  CALCIUM 8.3* 8.3*    PT/INR: No results for input(s): LABPROT, INR in the last 72 hours. ABG    Component Value Date/Time   PHART 7.448 09/12/2016 1610   HCO3 31.4 (H) 09/12/2016 1610   TCO2 24 09/06/2016  1639   ACIDBASEDEF 3.0 (H) 09/05/2016 2325   O2SAT 91.5 09/12/2016 1610   CBG (last 3)  No results for input(s): GLUCAP in the last 72 hours.  Meds Scheduled Meds: . amiodarone  200 mg Oral BID  . arformoterol  15 mcg Nebulization BID  . aspirin EC  325 mg Oral Daily   Or  . aspirin  324 mg Per Tube Daily  . atorvastatin  40 mg Oral Daily  . bisacodyl  10 mg Oral Daily   Or  . bisacodyl  10 mg Rectal Daily  . budesonide (PULMICORT) nebulizer solution  0.5 mg Nebulization BID  . Chlorhexidine Gluconate Cloth  6 each Topical Daily  . docusate sodium  200 mg Oral Daily  . enoxaparin (LOVENOX) injection  40 mg Subcutaneous Q24H  . ferrous sulfate  325 mg Oral BID WC  . folic acid  1 mg Oral Daily  . furosemide  40 mg Oral Daily  . ipratropium  0.5 mg Nebulization TID  . loratadine  10 mg Oral Daily  . mouth rinse  15 mL Mouth Rinse BID  . pantoprazole  40 mg Oral Daily  . potassium chloride  40 mEq Oral Daily  . sodium chloride flush  10-40 mL Intracatheter Q12H  . sodium chloride flush  3 mL Intravenous Q12H  . tamsulosin  0.4 mg Oral QPC supper   Continuous Infusions: .  sodium chloride    . cefTRIAXone (ROCEPHIN)  IV Stopped (09/15/16 2156)   PRN Meds:.sodium chloride, acetaminophen, alum & mag hydroxide-simeth, fluticasone, guaiFENesin-dextromethorphan, levalbuterol, magnesium hydroxide, ondansetron (ZOFRAN) IV, oxyCODONE, sodium chloride flush, sodium chloride flush, traMADol, zolpidem  Xrays Dg Chest Port 1 View  Result Date: 09/15/2016 CLINICAL DATA:  COPD. EXAM: PORTABLE CHEST 1 VIEW COMPARISON:  Multiple prior chest x-rays, most recently dated September 12, 2016. FINDINGS: The left diaphragm and costophrenic angle are excluded from the field-of-view. Right PICC catheter with tip in the distal SVC. Prior CABG. Stable cardiomediastinal silhouette. Normal pulmonary vascularity. Hyperinflation. No focal consolidation, pleural effusion, or pneumothorax. No acute osseous  abnormality. IMPRESSION: COPD. No active cardiopulmonary disease. Please note the left diaphragm and costophrenic angle are not included in the field of view. Electronically Signed   By: Obie Dredge M.D.   On: 09/15/2016 08:10    Assessment/Plan: S/P Procedure(s) (LRB): AORTIC VALVE REPLACEMENT (AVR) USING 23 MM MAGNA EASE PERICARDIAL TISSUE VALVE (N/A) TRANSESOPHAGEAL ECHOCARDIOGRAM (TEE) (N/A) CORONARY ARTERY BYPASS GRAFTING (CABG) USING LIMA TO DISTAL LAD AND ENDOSCOPICALLY HARVESTED GREATER SAPHENOUS VEIN TO CIRC. (N/A) ENDOVEIN HARVEST OF GREATER SAPHENOUS VEIN (Right)   1 recurrence of afib with RVR, now sinus tachy- monitor on current rx, cardiology is assisting with management 2 pulm status slowly improving, PCCM assisting with management- recheck CXR to make sure no pulm infiltrates c/w amio toxicity are developing 3 cont current UTI management 4 push rehab as able   LOS: 11 days    [name],[name] 09/16/2016 Dg Chest 2 View  Result Date: 09/16/2016 CLINICAL DATA:  Hypertension. Status post coronary artery bypass grafting EXAM: CHEST  2 VIEW COMPARISON:  September 15, 2016 FINDINGS: Central catheter tip is in the superior vena cava. No pneumothorax. Temporary pacemaker wires are attached to the right heart. There is upper lobe bullous disease, more severe on the right than on the left. There is scarring in the lung bases. There is a small left pleural effusion. There is no edema or consolidation. Patient is status post aortic valve replacement and coronary artery bypass grafting. Heart is borderline enlarged with pulmonary vascularity within normal limits. No adenopathy. No bone lesions. IMPRESSION: No pneumothorax. Bibasilar scarring. Bullous disease in the upper lobes, more severe on the right than on the left. No edema or consolidation. Areas of postoperative change. Heart borderline enlarged, stable. Electronically Signed   By: Bretta Bang III M.D.   On: 09/16/2016 09:36     Walked to end of hall , better today then has been Brief episode of afib Wbc back to normal  Reviewed recommendation by Dr Jens Som and agree , at risk of continued episodic afib, start apixaban after pacing wires removed  Avoid increasing beta blocker  Continue to treat ecoli in urine Wound ok Home on o2 but still very limited , not ready to go home  Will avoid cipro : 4.7 AAA followed by Dr Darrick Penna  Patient was warned about not using Cipro and similar antibiotics. Recent studies have raised concern that fluoroquinolone antibiotics could be associated with an increased risk of aortic aneurysm Fluoroquinolones have non-antimicrobial properties that might jeopardise the integrity of the extracellular matrix of the vascular wall In a  propensity score matched cohort study in [place], there was a 66% increased rate of aortic aneurysm or dissection associated with oral fluoroquinolone use, compared with amoxicillin use, within a 60 day risk period from start of treatment   I have seen and examined [name] and  agree with the above assessment  and plan.  Delight Ovens MD Beeper [phone] Office [phone] 09/16/2016 12:19 PM

## 2016-09-17 LAB — BASIC METABOLIC PANEL
Anion gap: 10 (ref 5–15)
BUN: 14 mg/dL (ref 6–20)
CO2: 30 mmol/L (ref 22–32)
Calcium: 8.2 mg/dL — ABNORMAL LOW (ref 8.9–10.3)
Chloride: 94 mmol/L — ABNORMAL LOW (ref 101–111)
Creatinine, Ser: 0.48 mg/dL — ABNORMAL LOW (ref 0.61–1.24)
GFR calc Af Amer: >60 mL/min (ref >60)
GFR calc non Af Amer: >60 mL/min (ref >60)
Glucose, Bld: 114 mg/dL — ABNORMAL HIGH (ref 65–99)
Potassium: 4.2 mmol/L (ref 3.5–5.1)
Sodium: 134 mmol/L — ABNORMAL LOW (ref 135–145)

## 2016-09-17 LAB — CBC
HCT: 22.7 % — ABNORMAL LOW (ref 39.0–52.0)
Hemoglobin: 7.3 g/dL — ABNORMAL LOW (ref 13.0–17.0)
MCH: 28.3 pg (ref 26.0–34.0)
MCHC: 32.2 g/dL (ref 30.0–36.0)
MCV: 88 fL (ref 78.0–100.0)
Platelets: 344 10*3/uL (ref 150–400)
RBC: 2.58 MIL/uL — ABNORMAL LOW (ref 4.22–5.81)
RDW: 14 % (ref 11.5–15.5)
WBC: 10.2 10*3/uL (ref 4.0–10.5)

## 2016-09-17 MED ORDER — DILTIAZEM HCL ER COATED BEADS 120 MG PO CP24
120.0000 mg | ORAL_CAPSULE | Freq: Every day | ORAL | Status: DC
  Administered 2016-09-17 – 2016-09-20 (×4): 120 mg via ORAL
  Filled 2016-09-17 (×4): qty 1

## 2016-09-17 NOTE — Discharge Instructions (Signed)
Aortic Valve Replacement, Care After °Refer to this sheet in the next few weeks. These instructions provide you with information about caring for yourself after your procedure. Your health care provider may also give you more specific instructions. Your treatment has been planned according to current medical practices, but problems sometimes occur. Call your health care provider if you have any problems or questions after your procedure. °What can I expect after the procedure? °After the procedure, it is common to have: °· Pain around your incision area. °· A small amount of blood or clear fluid coming from your incision. ° °Follow these instructions at home: °Eating and drinking ° °· Follow instructions from your health care provider about eating or drinking restrictions. °? Limit alcohol intake to no more than 1 drink per day for nonpregnant women and 2 drinks per day for men. One drink equals 12 oz of beer, 5 oz of wine, or 1½ oz of hard liquor. °? Limit how much caffeine you drink. Caffeine can affect your heart's rate and rhythm. °· Drink enough fluid to keep your urine clear or pale yellow. °· Eat a heart-healthy diet. This should include plenty of fresh fruits and vegetables. If you eat meat, it should be lean cuts. Avoid foods that are: °? High in salt, saturated fat, or sugar. °? Canned or highly processed. °? Fried. °Activity °· Return to your normal activities as told by your health care provider. Ask your health care provider what activities are safe for you. °· Exercise regularly once you have recovered, as told by your health care provider. °· Avoid sitting for more than 2 hours at a time without moving. Get up and move around at least once every 1-2 hours. This helps to prevent blood clots in the legs. °· Do not lift anything that is heavier than 10 lb (4.5 kg) until your health care provider approves. °· Avoid pushing or pulling things with your arms until your health care provider approves. This  includes pulling on handrails to help you climb stairs. °Incision care ° °· Follow instructions from your health care provider about how to take care of your incision. Make sure you: °? Wash your hands with soap and water before you change your bandage (dressing). If soap and water are not available, use hand sanitizer. °? Change your dressing as told by your health care provider. °? Leave stitches (sutures), skin glue, or adhesive strips in place. These skin closures may need to stay in place for 2 weeks or longer. If adhesive strip edges start to loosen and curl up, you may trim the loose edges. Do not remove adhesive strips completely unless your health care provider tells you to do that. °· Check your incision area every day for signs of infection. Check for: °? More redness, swelling, or pain. °? More fluid or blood. °? Warmth. °? Pus or a bad smell. °Medicines °· Take over-the-counter and prescription medicines only as told by your health care provider. °· If you were prescribed an antibiotic medicine, take it as told by your health care provider. Do not stop taking the antibiotic even if you start to feel better. °Travel °· Avoid airplane travel for as long as told by your health care provider. °· When you travel, bring a list of your medicines and a record of your medical history with you. Carry your medicines with you. °Driving °· Ask your health care provider when it is safe for you to drive. Do not drive until your health   care provider approves.  Do not drive or operate heavy machinery while taking prescription pain medicine. Lifestyle   Do not use any tobacco products, such as cigarettes, chewing tobacco, or e-cigarettes. If you need help quitting, ask your health care provider.  Resume sexual activity as told by your health care provider. Do not use medicines for erectile dysfunction unless your health care provider approves, if this applies.  Work with your health care provider to keep your  blood pressure and cholesterol under control, and to manage any other heart conditions that you have.  Maintain a healthy weight. General instructions  Do not take baths, swim, or use a hot tub until your health care provider approves.  Do not strain to have a bowel movement.  Avoid crossing your legs while sitting down.  Check your temperature every day for a fever. A fever may be a sign of infection.  If you are a woman and you plan to become pregnant, talk with your health care provider before you become pregnant.  Wear compression stockings if your health care provider instructs you to do this. These stockings help to prevent blood clots and reduce swelling in your legs.  Tell all health care providers who care for you that you have an artificial (prosthetic) aortic valve. If you have or have had heart disease or endocarditis, tell all health care providers about these conditions as well.  Keep all follow-up visits as told by your health care provider. This is important. Contact a health care provider if:  You develop a skin rash.  You experience sudden, unexplained changes in your weight.  You have more redness, swelling, or pain around your incision.  You have more fluid or blood coming from your incision.  Your incision feels warm to the touch.  You have pus or a bad smell coming from your incision.  You have a fever. Get help right away if:  You develop chest pain that is different from the pain coming from your incision. You develop shortness of breathApixaban oral tablets What is this medicine? APIXABAN (a PIX a ban) is an anticoagulant (blood thinner). It is used to lower the chance of stroke in people with a medical condition called atrial fibrillation. It is also used to treat or prevent blood clots in the lungs or in the veins. This medicine may be used for other purposes; ask your health care provider or pharmacist if you have questions. COMMON BRAND NAME(S):  Eliquis What should I tell my health care provider before I take this medicine? They need to know if you have any of these conditions: -bleeding disorders -bleeding in the brain -blood in your stools (black or tarry stools) or if you have blood in your vomit -history of stomach bleeding -kidney disease -liver disease -mechanical heart valve -an unusual or allergic reaction to apixaban, other medicines, foods, dyes, or preservatives -pregnant or trying to get pregnant -breast-feeding How should I use this medicine? Take this medicine by mouth with a glass of water. Follow the directions on the prescription label. You can take it with or without food. If it upsets your stomach, take it with food. Take your medicine at regular intervals. Do not take it more often than directed. Do not stop taking except on your doctor's advice. Stopping this medicine may increase your risk of a blot clot. Be sure to refill your prescription before you run out of medicine. Talk to your pediatrician regarding the use of this medicine in children. Special  care may be needed. Overdosage: If you think you have taken too much of this medicine contact a poison control center or emergency room at once. NOTE: This medicine is only for you. Do not share this medicine with others. What if I miss a dose? If you miss a dose, take it as soon as you can. If it is almost time for your next dose, take only that dose. Do not take double or extra doses. What may interact with this medicine? This medicine may interact with the following: -aspirin and aspirin-like medicines -certain medicines for fungal infections like ketoconazole and itraconazole -certain medicines for seizures like carbamazepine and phenytoin -certain medicines that treat or prevent blood clots like warfarin, enoxaparin, and dalteparin -clarithromycin -NSAIDs, medicines for pain and inflammation, like ibuprofen or naproxen -rifampin -ritonavir -St. John's  wort This list may not describe all possible interactions. Give your health care provider a list of all the medicines, herbs, non-prescription drugs, or dietary supplements you use. Also tell them if you smoke, drink alcohol, or use illegal drugs. Some items may interact with your medicine. What should I watch for while using this medicine? Visit your doctor or health care professional for regular checks on your progress. Notify your doctor or health care professional and seek emergency treatment if you develop breathing problems; changes in vision; chest pain; severe, sudden headache; pain, swelling, warmth in the leg; trouble speaking; sudden numbness or weakness of the face, arm or leg. These can be signs that your condition has gotten worse. If you are going to have surgery or other procedure, tell your doctor that you are taking this medicine. What side effects may I notice from receiving this medicine? Side effects that you should report to your doctor or health care professional as soon as possible: -allergic reactions like skin rash, itching or hives, swelling of the face, lips, or tongue -signs and symptoms of bleeding such as bloody or black, tarry stools; red or dark-brown urine; spitting up blood or brown material that looks like coffee grounds; red spots on the skin; unusual bruising or bleeding from the eye, gums, or nose This list may not describe all possible side effects. Call your doctor for medical advice about side effects. You may report side effects to FDA at 1-800-FDA-1088. Where should I keep my medicine? Keep out of the reach of children. Store at room temperature between 20 and 25 degrees C (68 and 77 degrees F). Throw away any unused medicine after the expiration date. NOTE: This sheet is a summary. It may not cover all possible information. If you have questions about this medicine, talk to your doctor, pharmacist, or health care provider.  2018 Elsevier/Gold Standard  (2015-08-17 11:54:23)  Atrial Fibrillation Atrial fibrillation is a type of heartbeat that is irregular or fast (rapid). If you have this condition, your heart keeps quivering in a weird (chaotic) way. This condition can make it so your heart cannot pump blood normally. Having this condition gives a person more risk for stroke, heart failure, and other heart problems. There are different types of atrial fibrillation. Talk with your doctor to learn about the type that you have. Follow these instructions at home:  Take over-the-counter and prescription medicines only as told by your doctor.  If your doctor prescribed a blood-thinning medicine, take it exactly as told. Taking too much of it can cause bleeding. If you do not take enough of it, you will not have the protection that you need against stroke and  other problems.  Do not use any tobacco products. These include cigarettes, chewing tobacco, and e-cigarettes. If you need help quitting, ask your doctor.  If you have apnea (obstructive sleep apnea), manage it as told by your doctor.  Do not drink alcohol.  Do not drink beverages that have caffeine. These include coffee, soda, and tea.  Maintain a healthy weight. Do not use diet pills unless your doctor says they are safe for you. Diet pills may make heart problems worse.  Follow diet instructions as told by your doctor.  Exercise regularly as told by your doctor.  Keep all follow-up visits as told by your doctor. This is important. Contact a doctor if:  You notice a change in the speed, rhythm, or strength of your heartbeat.  You are taking a blood-thinning medicine and you notice more bruising.  You get tired more easily when you move or exercise. Get help right away if:  You have pain in your chest or your belly (abdomen).  You have sweating or weakness.  You feel sick to your stomach (nauseous).  You notice blood in your throw up (vomit), poop (stool), or pee  (urine).  You are short of breath.  You suddenly have swollen feet and ankles.  You feel dizzy.  Your suddenly get weak or numb in your face, arms, or legs, especially if it happens on one side of your body.  You have trouble talking, trouble understanding, or both.  Your face or your eyelid droops on one side. These symptoms may be an emergency. Do not wait to see if the symptoms will go away. Get medical help right away. Call your local emergency services (911 in the [place] Do not drive yourself to the hospital. This information is not intended to replace advice given to you by your health care provider. Make sure you discuss any questions you have with your health care provider. Document Released: 11/03/2007 Document Revised: 07/02/2015 Document Reviewed: 05/21/2014 Elsevier Interactive Patient Education  2018 Else[employer] Inc.  Chronic Obstructive Pulmonary Disease Chronic obstructive pulmonary disease (COPD) is a long-term (chronic) lung problem. When you have COPD, it is hard for air to get in and out of your lungs. The way your lungs work will never return to normal. Usually the condition gets worse over time. There are things you can do to keep yourself as healthy as possible. Your doctor may treat your condition with:  Medicines.  Quitting smoking, if you smoke.  Rehabilitation. This may involve a team of specialists.  Oxygen.  Exercise and changes to your diet.  Lung surgery.  Comfort measures (palliative care).  Follow these instructions at home: Medicines  Take over-the-counter and prescription medicines only as told by your doctor.  Talk to your doctor before taking any cough or allergy medicines. You may need to avoid medicines that cause your lungs to be dry. Lifestyle  If you smoke, stop. Smoking makes the problem worse. If you need help quitting, ask your doctor.  Avoid being around things that make your breathing worse. This may include smoke, chemicals,  and fumes.  Stay active, but remember to also rest.  Learn and use tips on how to relax.  Make sure you get enough sleep. Most adults need at least 7 hours a night.  Eat healthy foods. Eat smaller meals more often. Rest before meals. Controlled breathing  Learn and use tips on how to control your breathing as told by your doctor. Try: ? Breathing in (inhaling) through your nose  for 1 second. Then, pucker your lips and breath out (exhale) through your lips for 2 seconds. ? Putting one hand on your belly (abdomen). Breathe in slowly through your nose for 1 second. Your hand on your belly should move out. Pucker your lips and breathe out slowly through your lips. Your hand on your belly should move in as you breathe out. Controlled coughing  Learn and use controlled coughing to clear mucus from your lungs. The steps are: 1. Lean your head a little forward. 2. Breathe in deeply. 3. Try to hold your breath for 3 seconds. 4. Keep your mouth slightly open while coughing 2 times. 5. Spit any mucus out into a tissue. 6. Rest and do the steps again 1 or 2 times as needed. General instructions  Make sure you get all the shots (vaccines) that your doctor recommends. Ask your doctor about a flu shot and a pneumonia shot.  Use oxygen therapy and therapy to help improve your lungs (pulmonary rehabilitation) if told by your doctor. If you need home oxygen therapy, ask your doctor if you should buy a tool to measure your oxygen level (oximeter).  Make a COPD action plan with your doctor. This helps you know what to do if you feel worse than usual.  Manage any other conditions you have as told by your doctor.  Avoid going outside when it is very hot, cold, or humid.  Avoid people who have a sickness you can catch (contagious).  Keep all follow-up visits as told by your doctor. This is important. Contact a doctor if:  You cough up more mucus than usual.  There is a change in the color or  thickness of the mucus.  It is harder to breathe than usual.  Your breathing is faster than usual.  You have trouble sleeping.  You need to use your medicines more often than usual.  You have trouble doing your normal activities such as getting dressed or walking around the house. Get help right away if:  You have shortness of breath while resting.  You have shortness of breath that stops you from: ? Being able to talk. ? Doing normal activities.  Your chest hurts for longer than 5 minutes.  Your skin color is more blue than usual.  Your pulse oximeter shows that you have low oxygen for longer than 5 minutes.  You have a fever.  You feel too tired to breathe normally. Summary  Chronic obstructive pulmonary disease (COPD) is a long-term lung problem.  The way your lungs work will never return to normal. Usually the condition gets worse over time. There are things you can do to keep yourself as healthy as possible.  Take over-the-counter and prescription medicines only as told by your doctor.  If you smoke, stop. Smoking makes the problem worse. This information is not intended to replace advice given to you by your health care provider. Make sure you discuss any questions you have with your health care provider. Document Released: 07/13/2007 Document Revised: 07/02/2015 Document Reviewed: 09/20/2012 Elsevier Interactive Patient Education  2017 [employer].   or difficulty breathing.  You start to feel light-headed. These symptoms may represent a serious problem that is an emergency. Do not wait to see if the symptoms will go away. Get medical help right away. Call your local emergency services (911 in the [place] Do not drive yourself to the hospital. This information is not intended to replace advice given to you by your health care provider.  Make sure you discuss any questions you have with your health care provider. Document Released: 08/12/2004 Document Revised:  07/02/2015 Document Reviewed: 12/28/2014 Elsevier Interactive Patient Education  2017 Else[employer] Inc.  Coronary Artery Bypass Grafting, Care After These instructions give you information on caring for yourself after your procedure. Your doctor may also give you more specific instructions. Call your doctor if you have any problems or questions after your procedure. Follow these instructions at home:  Only take medicine as told by your doctor. Take medicines exactly as told. Do not stop taking medicines or start any new medicines without talking to your doctor first.  Take your pulse as told by your doctor.  Do deep breathing as told by your doctor. Use your breathing device (incentive spirometer), if given, to practice deep breathing several times a day. Support your chest with a pillow or your arms when you take deep breaths or cough.  Keep the area clean, dry, and protected where the surgery cuts (incisions) were made. Remove bandages (dressings) only as told by your doctor. If strips were applied to surgical area, do not take them off. They fall off on their own.  Check the surgery area daily for puffiness (swelling), redness, or leaking fluid.  If surgery cuts were made in your legs: ? Avoid crossing your legs. ? Avoid sitting for long periods of time. Change positions every 30 minutes. ? Raise your legs when you are sitting. Place them on pillows.  Wear stockings that help keep blood clots from forming in your legs (compression stockings).  Only take sponge baths until your doctor says it is okay to take showers. Pat the surgery area dry. Do not rub the surgery area with a washcloth or towel. Do not bathe, swim, or use a hot tub until your doctor says it is okay.  Eat foods that are high in fiber. These include raw fruits and vegetables, whole grains, beans, and nuts. Choose lean meats. Avoid canned, processed, and fried foods.  Drink enough fluids to keep your pee (urine) clear or  pale yellow.  Weigh yourself every day.  Rest and limit activity as told by your doctor. You may be told to: ? Stop any activity if you have chest pain, shortness of breath, changes in heartbeat, or dizziness. Get help right away if this happens. ? Move around often for short amounts of time or take short walks as told by your doctor. Gradually become more active. You may need help to strengthen your muscles and build endurance. ? Avoid lifting, pushing, or pulling anything heavier than 10 pounds (4.5 kg) for at least 6 weeks after surgery.  Do not drive until your doctor says it is okay.  Ask your doctor when you can go back to work.  Ask your doctor when you can begin sexual activity again.  Follow up with your doctor as told. Contact a doctor if:  You have puffiness, redness, more pain, or fluid draining from the incision site.  You have a fever.  You have puffiness in your ankles or legs.  You have pain in your legs.  You gain 2 or more pounds (0.9 kg) a day.  You feel sick to your stomach (nauseous) or throw up (vomit).  You have watery poop (diarrhea). Get help right away if:  You have chest pain that goes to your jaw or arms.  You have shortness of breath.  You have a fast or irregular heartbeat.  You notice a "clicking" in your breastbone  when you move.  You have numbness or weakness in your arms or legs.  You feel dizzy or light-headed. This information is not intended to replace advice given to you by your health care provider. Make sure you discuss any questions you have with your health care provider. Document Released: 01/29/2013 Document Revised: 07/02/2015 Document Reviewed: 07/03/2012 Elsevier Interactive Patient Education  2017 [employer].  Information on my medicine - ELIQUIS (apixaban)  This medication education was reviewed with me or my healthcare representative as part of my discharge preparation.    Why was Eliquis prescribed for  you? Eliquis was prescribed for you to reduce the risk of a blood clot forming that can cause a stroke if you have a medical condition called atrial fibrillation (a type of irregular heartbeat).  What do You need to know about Eliquis ? Take your Eliquis TWICE DAILY - one tablet in the morning and one tablet in the evening with or without food. If you have difficulty swallowing the tablet whole please discuss with your pharmacist how to take the medication safely.  Take Eliquis exactly as prescribed by your doctor and DO NOT stop taking Eliquis without talking to the doctor who prescribed the medication.  Stopping may increase your risk of developing a stroke.  Refill your prescription before you run out.  After discharge, you should have regular check-up appointments with your healthcare provider that is prescribing your Eliquis.  In the future your dose may need to be changed if your kidney function or weight changes by a significant amount or as you get older.  What do you do if you miss a dose? If you miss a dose, take it as soon as you remember on the same day and resume taking twice daily.  Do not take more than one dose of ELIQUIS at the same time to make up a missed dose.  Important Safety Information A possible side effect of Eliquis is bleeding. You should call your healthcare provider right away if you experience any of the following: ? Bleeding from an injury or your nose that does not stop. ? Unusual colored urine (red or dark brown) or unusual colored stools (red or black). ? Unusual bruising for unknown reasons. ? A serious fall or if you hit your head (even if there is no bleeding).  Some medicines may interact with Eliquis and might increase your risk of bleeding or clotting while on Eliquis. To help avoid this, consult your healthcare provider or pharmacist prior to using any new prescription or non-prescription medications, including herbals, vitamins, non-steroidal  anti-inflammatory drugs (NSAIDs) and supplements.  This website has more information on Eliquis (apixaban): http://[email or web address].com/eliquis/home

## 2016-09-17 NOTE — Progress Notes (Signed)
O2 desaturation of 78%, while eating and sitting upright in bed. Increased o2 via [place] from 2 liters to 3 liters patient sats went up to 92% then quickly began to desat again to 88, increased o2 to 4 liters, sustained at 88%, then bumped up to 5 liters where patient is not 98-100%, resting well. Continuing to monitor

## 2016-09-17 NOTE — Progress Notes (Signed)
Eliquis 30 day card, and $10 co pay card provided to patient and wife. Verbalized understanding for use.

## 2016-09-17 NOTE — Progress Notes (Signed)
CARDIAC REHAB PHASE I   PRE:  Rate/Rhythm: 82 SR  BP:  Supine:   Sitting: 98/64  Standing:   SaO2: 100 3LNC  MODE:  Ambulation: 470 ft rolling walk[employer] O2 4LNc  POST:  Rate/Rhythm: 96 SR  BP:  Supine:   Sitting: 136/85  Standing:    SaO2: 90 3LNC  Pt up to ambulate x 1 assist with RW.  Pt resting in chair on 3 LNC with O2 sat 100%.  Pt ambulating on 3LNC with continuous monitoring of O2 sat.  At 100 feet O2 sat dropped to 85%. Pt with no increased shortness of breath or distress. Stopped and rested with demonstration of good PLB technique.  Oxygen therapy increased to [facility]4LNC with remainder of the walk. Maintain o2 sat at 90%.  Pt back to the chair, wife at bedside.  Pt remains on 3LNC with o2 sat- 92.  Rechecked after 10 minutes - O2 sat 100% at rest on 3LNC. Alanson Alyarlette Carlton RN, BSN Cardiac and Emergency planning/management officerulmonary Rehab Nurse Navigator

## 2016-09-17 NOTE — Progress Notes (Signed)
Progress Note  Patient Name: [name] Date of Encounter: 09/17/2016  Primary Cardiologist: Dr Purvis Sheffield  Subjective   Mild dyspnea this AM; no chest pain  Inpatient Medications    Scheduled Meds: . amiodarone  200 mg Oral BID  . apixaban  5 mg Oral BID  . arformoterol  15 mcg Nebulization BID  . aspirin EC  81 mg Oral Daily   Or  . aspirin  81 mg Per Tube Daily  . atorvastatin  40 mg Oral Daily  . bisacodyl  10 mg Oral Daily   Or  . bisacodyl  10 mg Rectal Daily  . budesonide (PULMICORT) nebulizer solution  0.5 mg Nebulization BID  . Chlorhexidine Gluconate Cloth  6 each Topical Daily  . diltiazem  30 mg Oral Q6H  . docusate sodium  200 mg Oral Daily  . ferrous sulfate  325 mg Oral BID WC  . folic acid  1 mg Oral Daily  . furosemide  40 mg Oral Daily  . ipratropium  0.5 mg Nebulization BID  . loratadine  10 mg Oral Daily  . mouth rinse  15 mL Mouth Rinse BID  . pantoprazole  40 mg Oral Daily  . potassium chloride  40 mEq Oral Daily  . sodium chloride flush  10-40 mL Intracatheter Q12H  . sodium chloride flush  3 mL Intravenous Q12H  . tamsulosin  0.4 mg Oral QPC supper   Continuous Infusions: . sodium chloride 250 mL (09/17/16 0606)  . cefTRIAXone (ROCEPHIN)  IV Stopped (09/16/16 2153)   PRN Meds: sodium chloride, acetaminophen, alum & mag hydroxide-simeth, fluticasone, guaiFENesin-dextromethorphan, levalbuterol, magnesium hydroxide, ondansetron (ZOFRAN) IV, oxyCODONE, sodium chloride flush, sodium chloride flush, traMADol   Vital Signs    Vitals:   09/17/16 0827 09/17/16 0834 09/17/16 0854 09/17/16 0952  BP:      Pulse:      Resp:      Temp:      TempSrc:      SpO2: 90% (!) 88% 100% 99%  Weight:      Height:        Intake/Output Summary (Last 24 hours) at 09/17/16 1015 Last data filed at 09/17/16 0500  Gross per 24 hour  Intake              290 ml  Output             1400 ml  Net            -1110 ml   Filed Weights   09/14/16 0415  09/16/16 0455 09/17/16 0458  Weight: 88.2 kg (194 lb 6.4 oz) 89.8 kg (198 lb) 89.3 kg (196 lb 12.8 oz)    Telemetry    Sinus to sinus tach with brief PAF - Personally Reviewed  Physical Exam   GEN: WD/WN, NAD Neck: Supple with no JVD Cardiac: RRR, 1/6 systolic murmur, no gallop Respiratory: Diminished BS throughout; Mild rhonchi GI: Soft, nontender, non-distended, + BS, no masses MS: trace edema, no chords Neuro:  grossly intact   Labs    Chemistry  Recent Labs Lab 09/15/16 0318 09/16/16 0520 09/17/16 0443  NA 133* 131* 134*  K 4.3 3.8 4.2  CL 94* 93* 94*  CO2 31 29 30   GLUCOSE 118* 119* 114*  BUN 17 14 14   CREATININE 0.57* 0.60* 0.48*  CALCIUM 8.3* 8.3* 8.2*  GFRNONAA >60 >60 >60  GFRAA >60 >60 >60  ANIONGAP 8 9 10      Hematology  Recent Labs  Lab 09/15/16 0318 09/16/16 0520 09/17/16 0443  WBC 20.3* 9.2 10.2  RBC 2.64* 2.64* 2.58*  HGB 7.6* 7.6* 7.3*  HCT 23.8* 23.3* 22.7*  MCV 90.2 88.3 88.0  MCH 28.8 28.8 28.3  MCHC 31.9 32.6 32.2  RDW 13.7 14.0 14.0  PLT 341 345 344     Radiology    Dg Chest 2 View  Result Date: 09/16/2016 CLINICAL DATA:  Hypertension. Status post coronary artery bypass grafting EXAM: CHEST  2 VIEW COMPARISON:  September 15, 2016 FINDINGS: Central catheter tip is in the superior vena cava. No pneumothorax. Temporary pacemaker wires are attached to the right heart. There is upper lobe bullous disease, more severe on the right than on the left. There is scarring in the lung bases. There is a small left pleural effusion. There is no edema or consolidation. Patient is status post aortic valve replacement and coronary artery bypass grafting. Heart is borderline enlarged with pulmonary vascularity within normal limits. No adenopathy. No bone lesions. IMPRESSION: No pneumothorax. Bibasilar scarring. Bullous disease in the upper lobes, more severe on the right than on the left. No edema or consolidation. Areas of postoperative change. Heart  borderline enlarged, stable. Electronically Signed   By: Bretta BangWilliam  Woodruff III M.D.   On: 09/16/2016 09:36    Patient Profile     [name] is a 66 y.o. male with a hx of Hypertension, hyperlipidemia, abdominal aortic aneurysm, COPD and now status post recent (09/06/16) aortic valve replacement as well as coronary artery bypass and graft who is being seen for the evaluation of atrial flutter.  Assessment & Plan    1. Atrial fibrillation/Atrial flutter-Pt in sinus this AM. Atrial arrhythmias likely postoperative from recent coronary artery bypass graft/aortic valve replacement but also at increase risk for atrial arrhythmias due to pulmonary disease. Continue amiodarone 200 mg twice a day for full 2 weeks and then 200 mg daily thereafter. Would continue for 8-12 weeks and if patient holds sinus rhythm discontinue at that time. Will avoid amiodarone long term given baseline lung disease. CHADSvasc 2. I am concerned that he may be having PAF related to lung disease as he is having this now 12 days postop. Continue apixaban 5 mg BID (pt had bioprosthetic aortic valve and not mechanical valve and I therefore think apixaban reasonable). Will avoid beta blocker due to COPD. Continue cardizem CD 120 mg daily.  2. Dyspnea-CTA showed no pulmonary embolus. There is severe COPD which is likely explanation for continued dyspnea.  3. S/p AVR/CABG-plan to continue aspirin and statin. Follow-up echocardiogram shows normally functioning aortic valve.  4. AAA-Patient will require follow-up abdominal ultrasound June 2019.  5. Lung nodule-noted on CT scan December 2017; patient will need follow-up chest CT December 2018.  6. Hypertension -blood pressure controlled.   7. Hyperlipidemia-continue statin.   8. COPD-Management per pulmonary  Signed, Olga MillersBrian Sofiah Lyne, MD  09/17/2016, 10:15 AM

## 2016-09-17 NOTE — Progress Notes (Addendum)
      [address]       [place] [id number]             [phone]        12 Days Post-Op Procedure(s) (LRB): AORTIC VALVE REPLACEMENT (AVR) USING 23 MM MAGNA EASE PERICARDIAL TISSUE VALVE (N/A) TRANSESOPHAGEAL ECHOCARDIOGRAM (TEE) (N/A) CORONARY ARTERY BYPASS GRAFTING (CABG) USING [place] TO DISTAL LAD AND ENDOSCOPICALLY HARVESTED GREATER SAPHENOUS VEIN TO CIRC. (N/A) ENDOVEIN HARVEST OF GREATER SAPHENOUS VEIN (Right)  Subjective: Patient eating breakfast. He states he is  short of breath this morning, but that is how he has been the last few mornings.  Objective: Vital signs in last 24 hours: Temp:  [97.5 F (36.4 C)-100.8 F (38.2 C)] 98.4 F (36.9 C) (08/11 0458) Pulse Rate:  [73-100] 76 (08/11 0458) Cardiac Rhythm: Normal sinus rhythm (08/11 0400) Resp:  [12-24] 12 (08/11 0458) BP: (96-125)/(57-64) 109/57 (08/11 0458) SpO2:  [90 %-100 %] 93 % (08/11 0458) Weight:  [89.3 kg (196 lb 12.8 oz)] 89.3 kg (196 lb 12.8 oz) (08/11 0458)  Pre op weight 86.2 kg Current Weight  09/17/16 89.3 kg (196 lb 12.8 oz)      Intake/Output from previous day: 08/10 0701 - 08/11 0700 In: 290 [P.O.:240; IV Piggyback:50] Out: 1700 [Urine:1700]   Physical Exam:  Cardiovascular: RRR Pulmonary: Has tachypnea this am, diminished breath sounds at bases. He does not appear in distress, however. Abdomen: Soft, non tender, bowel sounds present. Extremities: Mild bilateral lower extremity edema. Wounds: Clean and dry.  No erythema or signs of infection.  Lab Results: CBC: Recent Labs  09/16/16 0520 09/17/16 0443  WBC 9.2 10.2  HGB 7.6* 7.3*  HCT 23.3* 22.7*  PLT 345 344   BMET:  Recent Labs  09/16/16 0520 09/17/16 0443  NA 131* 134*  K 3.8 4.2  CL 93* 94*  CO2 29 30  GLUCOSE 119* 114*  BUN 14 14  CREATININE 0.60* 0.48*  CALCIUM 8.3* 8.2*    PT/INR:  Lab Results  Component Value Date   INR 1.64 09/05/2016   INR 1.00 09/01/2016   INR 1.06 08/29/2016    ABG:  INR: Will add last result for INR, ABG once components are confirmed Will add last 4 CBG results once components are confirmed  Assessment/Plan:  1. CV - Previous a fib with RVR. On Amiodarone 200 mg bid, Cardizem 30 mg qid. Cardiology following. 2.  Pulmonary - Severe COPD. On 2 liters of oxygen via [place]. Will likely need home oxygen at discharge.Continue Pulmicort, Brovana, and Atrovent. Appreciate pulmonary's assistance. 3. Volume Overload - On Lasix 40 mg daily 4.  Acute blood loss anemia - H and H slightly decreased to 7.3 and 22.7. Continue ferrous sulfate and folic acid.  5. ID-on Rocephin for UTI  ZIMMERMAN,DONIELLE MPA-C 09/17/2016,7:59 AM   Chart reviewed, patient examined, agree with above. Maintaining sinus rhythm on amio and cardizem. Eliquis for atrial fib with tissue valve.

## 2016-09-18 LAB — CULTURE, BLOOD (ROUTINE X 2)
Culture: NO GROWTH
Culture: NO GROWTH
Special Requests: ADEQUATE

## 2016-09-18 NOTE — Progress Notes (Signed)
Progress Note  Patient Name: [name] Date of Encounter: 09/18/2016  Primary Cardiologist: Dr Purvis Sheffield  Subjective   Mild dyspnea this AM but improved; no chest pain  Inpatient Medications    Scheduled Meds: . amiodarone  200 mg Oral BID  . apixaban  5 mg Oral BID  . arformoterol  15 mcg Nebulization BID  . aspirin EC  81 mg Oral Daily   Or  . aspirin  81 mg Per Tube Daily  . atorvastatin  40 mg Oral Daily  . bisacodyl  10 mg Oral Daily   Or  . bisacodyl  10 mg Rectal Daily  . budesonide (PULMICORT) nebulizer solution  0.5 mg Nebulization BID  . Chlorhexidine Gluconate Cloth  6 each Topical Daily  . diltiazem  120 mg Oral Daily  . docusate sodium  200 mg Oral Daily  . ferrous sulfate  325 mg Oral BID WC  . folic acid  1 mg Oral Daily  . furosemide  40 mg Oral Daily  . ipratropium  0.5 mg Nebulization BID  . loratadine  10 mg Oral Daily  . mouth rinse  15 mL Mouth Rinse BID  . pantoprazole  40 mg Oral Daily  . potassium chloride  40 mEq Oral Daily  . sodium chloride flush  10-40 mL Intracatheter Q12H  . sodium chloride flush  3 mL Intravenous Q12H  . tamsulosin  0.4 mg Oral QPC supper   Continuous Infusions: . sodium chloride Stopped (09/17/16 1751)  . cefTRIAXone (ROCEPHIN)  IV 1 g (09/17/16 1749)   PRN Meds: sodium chloride, acetaminophen, alum & mag hydroxide-simeth, fluticasone, guaiFENesin-dextromethorphan, levalbuterol, magnesium hydroxide, ondansetron (ZOFRAN) IV, oxyCODONE, sodium chloride flush, sodium chloride flush, traMADol   Vital Signs    Vitals:   09/17/16 2124 09/18/16 0006 09/18/16 0635 09/18/16 0803  BP:  115/65 128/70 100/63  Pulse:  73 73 74  Resp:  18 16 (!) 21  Temp:  98 F (36.7 C) 98.2 F (36.8 C) 97.8 F (36.6 C)  TempSrc:  Axillary Oral   SpO2: 100% 95% 97% 98%  Weight:   88.7 kg (195 lb 8 oz)   Height:        Intake/Output Summary (Last 24 hours) at 09/18/16 0919 Last data filed at 09/18/16 0803  Gross per 24  hour  Intake              600 ml  Output             2801 ml  Net            -2201 ml   Filed Weights   09/16/16 0455 09/17/16 0458 09/18/16 0635  Weight: 89.8 kg (198 lb) 89.3 kg (196 lb 12.8 oz) 88.7 kg (195 lb 8 oz)    Telemetry    Sinus - Personally Reviewed  Physical Exam   GEN: WD/WN, mild dyspnea at rest Neck: Supple Cardiac: RRR, 1/6 systolic murmur, no diastolic murmur Respiratory: Diminished BS throughout; no wheeze GI: Soft, NT/ND MS: trace to 1+ edema Neuro:  no focal findings   Labs    Chemistry  Recent Labs Lab 09/15/16 0318 09/16/16 0520 09/17/16 0443  NA 133* 131* 134*  K 4.3 3.8 4.2  CL 94* 93* 94*  CO2 31 29 30   GLUCOSE 118* 119* 114*  BUN 17 14 14   CREATININE 0.57* 0.60* 0.48*  CALCIUM 8.3* 8.3* 8.2*  GFRNONAA >60 >60 >60  GFRAA >60 >60 >60  ANIONGAP 8 9 10  Hematology  Recent Labs Lab 09/15/16 0318 09/16/16 0520 09/17/16 0443  WBC 20.3* 9.2 10.2  RBC 2.64* 2.64* 2.58*  HGB 7.6* 7.6* 7.3*  HCT 23.8* 23.3* 22.7*  MCV 90.2 88.3 88.0  MCH 28.8 28.8 28.3  MCHC 31.9 32.6 32.2  RDW 13.7 14.0 14.0  PLT 341 345 344     Radiology    No results found.  Patient Profile     Lowell BoutonKenneth D Pillsbury is a 66 y.o. male with a hx of Hypertension, hyperlipidemia, abdominal aortic aneurysm, COPD and now status post recent (09/06/16) aortic valve replacement as well as coronary artery bypass and graft who is being seen for the evaluation of atrial flutter.  Assessment & Plan    1. Atrial fibrillation/Atrial flutter-Pt remains in sinus this AM. Atrial arrhythmias likely postoperative from recent coronary artery bypass graft/aortic valve replacement but also at increase risk for atrial arrhythmias due to pulmonary disease. Continue amiodarone 200 mg twice a day until DC and then reduce to 200 mg daily. Would continue for 8-12 weeks and if patient holds sinus rhythm discontinue at that time. Will avoid amiodarone long term given baseline lung  disease. CHADSvasc 2. I am concerned that he may be having PAF related to lung disease as he has had this out to 12 days postop. Continue apixaban 5 mg BID (pt had bioprosthetic aortic valve and not mechanical valve and I therefore think apixaban reasonable). Will avoid beta blocker due to COPD. Continue cardizem CD 120 mg daily.  2. Dyspnea-CTA showed no pulmonary embolus. There is severe COPD which is likely explanation for continued dyspnea; superimposed postoperative volume excess also contributing. Continue lasix at present dose.  3. S/p AVR/CABG-plan to continue aspirin and statin. Follow-up echocardiogram shows normally functioning aortic valve.  4. AAA-Patient will require follow-up abdominal ultrasound June 2019.  5. Lung nodule-noted on CT scan December 2017; patient will need follow-up chest CT December 2018.  6. Hypertension -blood pressure controlled; continue present meds.   7. Hyperlipidemia-continue statin.   8. COPD-Management per pulmonary  Signed, Olga MillersBrian Season Astacio, MD  09/18/2016, 9:19 AM

## 2016-09-18 NOTE — Progress Notes (Addendum)
      [address]       [place] [id number]             [phone]        13 Days Post-Op Procedure(s) (LRB): AORTIC VALVE REPLACEMENT (AVR) USING 23 MM MAGNA EASE PERICARDIAL TISSUE VALVE (N/A) TRANSESOPHAGEAL ECHOCARDIOGRAM (TEE) (N/A) CORONARY ARTERY BYPASS GRAFTING (CABG) USING [place] TO DISTAL LAD AND ENDOSCOPICALLY HARVESTED GREATER SAPHENOUS VEIN TO CIRC. (N/A) ENDOVEIN HARVEST OF GREATER SAPHENOUS VEIN (Right)  Subjective: Patient states he had a good day yesterday. No specific complaints this am.  Objective: Vital signs in last 24 hours: Temp:  [97.7 F (36.5 C)-98.4 F (36.9 C)] 98.2 F (36.8 C) (08/12 0635) Pulse Rate:  [71-85] 73 (08/12 0635) Cardiac Rhythm: Normal sinus rhythm (08/11 2311) Resp:  [16-23] 16 (08/12 0635) BP: (115-136)/(65-84) 128/70 (08/12 0635) SpO2:  [86 %-100 %] 97 % (08/12 0635) Weight:  [88.7 kg (195 lb 8 oz)] 88.7 kg (195 lb 8 oz) (08/12 0635)  Pre op weight 86.2 kg Current Weight  09/18/16 88.7 kg (195 lb 8 oz)      Intake/Output from previous day: 08/11 0701 - 08/12 0700 In: 480 [P.O.:480] Out: 2801 [Urine:2800; Stool:1]   Physical Exam:  Cardiovascular: RRR Pulmonary: Diminished breath sounds at bases. Abdomen: Soft, non tender, bowel sounds present. Extremities: Mild bilateral lower extremity edema. Wounds: Clean and dry.  No erythema or signs of infection.  Lab Results: CBC:  Recent Labs  09/16/16 0520 09/17/16 0443  WBC 9.2 10.2  HGB 7.6* 7.3*  HCT 23.3* 22.7*  PLT 345 344   BMET:   Recent Labs  09/16/16 0520 09/17/16 0443  NA 131* 134*  K 3.8 4.2  CL 93* 94*  CO2 29 30  GLUCOSE 119* 114*  BUN 14 14  CREATININE 0.60* 0.48*  CALCIUM 8.3* 8.2*    PT/INR:  Lab Results  Component Value Date   INR 1.64 09/05/2016   INR 1.00 09/01/2016   INR 1.06 08/29/2016   ABG:  INR: Will add last result for INR, ABG once components are confirmed Will add last 4 CBG results once components  are confirmed  Assessment/Plan:  1. CV - Previous a fib with RVR. Maintaining SR. On Amiodarone 200 mg bid, Cardizem 120 mg daily, and Eliquis 5 mg bid. Cardiology following. 2.  Pulmonary - Severe COPD. On 3 liters of oxygen via [place]. Will need home oxygen at discharge.Continue Pulmicort, Brovana, and Atrovent. Appreciate pulmonary's assistance. 3. Volume Overload - On Lasix 40 mg daily 4.  Acute blood loss anemia - H and H slightly decreased to 7.3 and 22.7. Continue ferrous sulfate and folic acid.  5. ID-on Rocephin for UTI 6. Hope to discharge in next few days  ZIMMERMAN,DONIELLE MPA-C 09/18/2016,7:10 AM   Chart reviewed, patient examined, agree with above. With his severe COPD and oxygen dependence he may benefit from transfusion with Hgb 7.3 but will leave that decision up to Dr. Tyrone SageGerhardt.

## 2016-09-19 DIAGNOSIS — Z951 Presence of aortocoronary bypass graft: Secondary | ICD-10-CM

## 2016-09-19 LAB — BASIC METABOLIC PANEL
Anion gap: 8 (ref 5–15)
BUN: 6 mg/dL (ref 6–20)
CO2: 33 mmol/L — ABNORMAL HIGH (ref 22–32)
Calcium: 8.8 mg/dL — ABNORMAL LOW (ref 8.9–10.3)
Chloride: 96 mmol/L — ABNORMAL LOW (ref 101–111)
Creatinine, Ser: 0.49 mg/dL — ABNORMAL LOW (ref 0.61–1.24)
GFR calc Af Amer: >60 mL/min (ref >60)
GFR calc non Af Amer: >60 mL/min (ref >60)
Glucose, Bld: 139 mg/dL — ABNORMAL HIGH (ref 65–99)
Potassium: 4.1 mmol/L (ref 3.5–5.1)
Sodium: 137 mmol/L (ref 135–145)

## 2016-09-19 LAB — CBC
HCT: 25.6 % — ABNORMAL LOW (ref 39.0–52.0)
Hemoglobin: 8.3 g/dL — ABNORMAL LOW (ref 13.0–17.0)
MCH: 29 pg (ref 26.0–34.0)
MCHC: 32.4 g/dL (ref 30.0–36.0)
MCV: 89.5 fL (ref 78.0–100.0)
Platelets: 499 10*3/uL — ABNORMAL HIGH (ref 150–400)
RBC: 2.86 MIL/uL — ABNORMAL LOW (ref 4.22–5.81)
RDW: 14.6 % (ref 11.5–15.5)
WBC: 9.1 10*3/uL (ref 4.0–10.5)

## 2016-09-19 MED ORDER — BUDESONIDE 0.5 MG/2ML IN SUSP: 0.5000 mg | Freq: Two times a day (BID) | RESPIRATORY_TRACT | 1 refills | Status: DC

## 2016-09-19 MED ORDER — AMIODARONE HCL 200 MG PO TABS: 200.0000 mg | ORAL_TABLET | Freq: Every day | ORAL | 1 refills | Status: DC

## 2016-09-19 MED ORDER — OXYCODONE HCL 5 MG PO TABS: 5.0000 mg | ORAL_TABLET | Freq: Four times a day (QID) | ORAL | 0 refills | Status: DC | PRN

## 2016-09-19 MED ORDER — LEVALBUTEROL HCL 0.63 MG/3ML IN NEBU: 0.6300 mg | INHALATION_SOLUTION | RESPIRATORY_TRACT | 1 refills | Status: DC | PRN

## 2016-09-19 MED ORDER — FUROSEMIDE 40 MG PO TABS: 40.0000 mg | ORAL_TABLET | Freq: Every day | ORAL | 0 refills | Status: DC

## 2016-09-19 MED ORDER — IPRATROPIUM BROMIDE 0.02 % IN SOLN: 0.5000 mg | Freq: Two times a day (BID) | RESPIRATORY_TRACT | 1 refills | Status: DC

## 2016-09-19 MED ORDER — APIXABAN 5 MG PO TABS: 5.0000 mg | ORAL_TABLET | Freq: Two times a day (BID) | ORAL | 1 refills | Status: DC

## 2016-09-19 MED ORDER — ARFORMOTEROL TARTRATE 15 MCG/2ML IN NEBU: 15.0000 ug | INHALATION_SOLUTION | Freq: Two times a day (BID) | RESPIRATORY_TRACT | 1 refills | Status: DC

## 2016-09-19 MED ORDER — FERROUS SULFATE 325 (65 FE) MG PO TABS: 325.0000 mg | ORAL_TABLET | Freq: Two times a day (BID) | ORAL | 3 refills | Status: DC

## 2016-09-19 MED ORDER — TAMSULOSIN HCL 0.4 MG PO CAPS: 0.4000 mg | ORAL_CAPSULE | Freq: Every day | ORAL | 1 refills | Status: DC

## 2016-09-19 MED ORDER — POTASSIUM CHLORIDE CRYS ER 20 MEQ PO TBCR: 20.0000 meq | EXTENDED_RELEASE_TABLET | Freq: Every day | ORAL | 0 refills | Status: DC

## 2016-09-19 MED ORDER — DILTIAZEM HCL ER COATED BEADS 120 MG PO CP24: 120.0000 mg | ORAL_CAPSULE | Freq: Every day | ORAL | 1 refills | Status: DC

## 2016-09-19 MED ORDER — FOLIC ACID 1 MG PO TABS: 1.0000 mg | ORAL_TABLET | Freq: Every day | ORAL | 1 refills | Status: DC

## 2016-09-19 NOTE — Progress Notes (Signed)
CARDIAC REHAB PHASE I   PRE:  Rate/Rhythm: 82 SR    BP: sitting 111/54    SaO2: 95 2 1/2 L  MODE:  Ambulation: 470 ft   POST:  Rate/Rhythm: 92 SR    BP: sitting 135/74     SaO2: 90 3L  I came early however pt significant dyspneic therefore awaited breathing tx before ambulating. He is less SOB at rest and able to walk with RW and 3L entire hallway. Steady. Rest x2 to slow breathing. Has to concentrate on breathing methods while walking. Slowly improving. Tired after walk. SaO2 90 3L. To recliner, will f/u later. [id number]   Daniel MassonRandi Kristan Magaret [name] CES, ACSM 09/19/2016 11:34 AM

## 2016-09-19 NOTE — Care Management Note (Addendum)
Case Management Note Previous CM note initiated by Lawerance Sabalebbie Swist, RN 09/16/2016, 11:00 AM  Patient Details  Name: Daniel BoutonKenneth D Norment MRN: [id number] Date of Birth: 02/13/1950  Subjective/Objective:                 S/P CABG x 2, AVR, self resolved A fib per progression report. New COPD. From home w wife, independent pta. Anticipate oxygen needs at DC as well [facility] and possibly PT.    Action/Plan:  CM will continue to follow and offer HH choice today, [facility] [place] [facility] He stated he needed some time to look over it prior to deciding. [facility] if needed at time of DC. Will need saturations with in 24 hours of DC.   [facility].  Expected Discharge Date:                  Expected Discharge Plan:  Home w Home Health Services  In-House Referral:     Discharge planning Services  CM Consult  Post Acute Care Choice:  Durable Medical Equipment Choice offered to:  Patient  DME Arranged:  Oxygen, Nebulizer machine, Walker rolling DME Agency:  [facility]  HH Arranged:    HH Agency:     Status of Service:  In process, will continue to follow  If discussed at Long Length of Stay Meetings, dates discussed:     Discharge Disposition:   Additional Comments:  09/19/16- 1100- Donn PieriniKristi Rica Heather RN, CM-  Referral received for home nebulizer- order placed - pt will also need home 02 and RW- [facility] who [facility] following for D[facility] needs- [facility] available per pt choice for any HH needs if ordered.   Zenda AlpersWebster, BrookvilleKristi Hall, RN 09/19/2016, 4:39 PM [phone]

## 2016-09-19 NOTE — Progress Notes (Signed)
Ed completed with pt and wife. Voiced understanding. [facility] Walked pt again to check SAO2. SaO2 on RA sitting down to 87 RA. Attempted to walk in hall on RA. After 30 ft in hall but very SOB. SaO2 would not register. Pt sts "I'm not ok" and I applied 2L. SaO2 eventually registered at 90 2L. Walked rest of walk 420 ft total on 3L, SaO2 92 3L after walk (slow to register).  SATURATION QUALIFICATIONS: (This note is used to comply with regulatory documentation for home oxygen)  Patient Saturations on Room Air at Rest = 87%  Patient Saturations on Room Air while Ambulating = wouldn not register  Patient Saturations on 2 Liters of oxygen while Ambulating = 90%  Please briefly explain why patient needs home oxygen: see above  Ethelda ChickKristan Corliss Lamartina CES, ACSM 3:34 PM 09/19/2016 1430-1520

## 2016-09-19 NOTE — Progress Notes (Addendum)
[address]       [place] [address]             [phone]      14 Days Post-Op Procedure(s) (LRB): AORTIC VALVE REPLACEMENT (AVR) USING 23 MM MAGNA EASE PERICARDIAL TISSUE VALVE (N/A) TRANSESOPHAGEAL ECHOCARDIOGRAM (TEE) (N/A) CORONARY ARTERY BYPASS GRAFTING (CABG) USING LIMA TO DISTAL LAD AND ENDOSCOPICALLY HARVESTED GREATER SAPHENOUS VEIN TO CIRC. (N/A) ENDOVEIN HARVEST OF GREATER SAPHENOUS VEIN (Right) Subjective: SOB is about the same. Ambulation improving. Rhythm is stable  Objective: Vital signs in last 24 hours: Temp:  [97.8 F (36.6 C)-98 F (36.7 C)] 98 F (36.7 C) (08/13 0425) Pulse Rate:  [72-79] 73 (08/13 0425) Cardiac Rhythm: Normal sinus rhythm (08/13 0701) Resp:  [14-24] 16 (08/13 0425) BP: (100-125)/(62-73) 115/73 (08/13 0425) SpO2:  [93 %-100 %] 99 % (08/13 0425) FiO2 (%):  [94 %] 94 % (08/12 0927) Weight:  [195 lb 1.6 oz (88.5 kg)-195 lb 8.8 oz (88.7 kg)] 195 lb 1.6 oz (88.5 kg) (08/13 0548)  Hemodynamic parameters for last 24 hours:    Intake/Output from previous day: 08/12 0701 - 08/13 0700 In: 490 [P.O.:480; I.V.:10] Out: 3150 [Urine:3150] Intake/Output this shift: No intake/output data recorded.  General appearance: alert, cooperative and no distress Heart: regular rate and rhythm Lungs: fair air exchange throughout Abdomen: benign Extremities: + edema Wound: incis healing well  Lab Results:  Recent Labs  09/17/16 0443  WBC 10.2  HGB 7.3*  HCT 22.7*  PLT 344   BMET:  Recent Labs  09/17/16 0443  NA 134*  K 4.2  CL 94*  CO2 30  GLUCOSE 114*  BUN 14  CREATININE 0.48*  CALCIUM 8.2*    PT/INR: No results for input(s): LABPROT, INR in the last 72 hours. ABG    Component Value Date/Time   PHART 7.448 09/12/2016 1610   HCO3 31.4 (H) 09/12/2016 1610   TCO2 24 09/06/2016 1639   ACIDBASEDEF 3.0 (H) 09/05/2016 2325   O2SAT 91.5 09/12/2016 1610   CBG (last 3)  No results for input(s): GLUCAP in the  last 72 hours.  Meds Scheduled Meds: . amiodarone  200 mg Oral BID  . apixaban  5 mg Oral BID  . arformoterol  15 mcg Nebulization BID  . aspirin EC  81 mg Oral Daily   Or  . aspirin  81 mg Per Tube Daily  . atorvastatin  40 mg Oral Daily  . bisacodyl  10 mg Oral Daily   Or  . bisacodyl  10 mg Rectal Daily  . budesonide (PULMICORT) nebulizer solution  0.5 mg Nebulization BID  . Chlorhexidine Gluconate Cloth  6 each Topical Daily  . diltiazem  120 mg Oral Daily  . docusate sodium  200 mg Oral Daily  . ferrous sulfate  325 mg Oral BID WC  . folic acid  1 mg Oral Daily  . furosemide  40 mg Oral Daily  . ipratropium  0.5 mg Nebulization BID  . loratadine  10 mg Oral Daily  . mouth rinse  15 mL Mouth Rinse BID  . pantoprazole  40 mg Oral Daily  . potassium chloride  40 mEq Oral Daily  . sodium chloride flush  10-40 mL Intracatheter Q12H  . sodium chloride flush  3 mL Intravenous Q12H  . tamsulosin  0.4 mg Oral QPC supper   Continuous Infusions: . sodium chloride Stopped (09/17/16 1751)  . cefTRIAXone (ROCEPHIN)  IV Stopped (09/18/16 1915)   PRN  Meds:.sodium chloride, acetaminophen, alum & mag hydroxide-simeth, fluticasone, guaiFENesin-dextromethorphan, levalbuterol, magnesium hydroxide, ondansetron (ZOFRAN) IV, oxyCODONE, sodium chloride flush, sodium chloride flush, traMADol  Xrays No results found.  Assessment/Plan: S/P Procedure(s) (LRB): AORTIC VALVE REPLACEMENT (AVR) USING 23 MM MAGNA EASE PERICARDIAL TISSUE VALVE (N/A) TRANSESOPHAGEAL ECHOCARDIOGRAM (TEE) (N/A) CORONARY ARTERY BYPASS GRAFTING (CABG) USING LIMA TO DISTAL LAD AND ENDOSCOPICALLY HARVESTED GREATER SAPHENOUS VEIN TO CIRC. (N/A) ENDOVEIN HARVEST OF GREATER SAPHENOUS VEIN (Right)   1 steady progress/stable  2 prob UTI is treated and will not need abx at discharge 3 cont aggressive pulm management- will need outpatient pulm arranged- wants f/u in [place] 4 sinus on current management 5 will discuss disposition  with MD   LOS: 14 days    GOLD,WAYNE E 09/19/2016   Holding sinus today, Feels better, still needs o2 with ambulation  H/h improved, will not transfuse Poss home in am I have seen and examined Lowell BoutonKenneth D Muraski and agree with the above assessment  and plan.  Delight OvensEdward B An Schnabel MD Beeper [phone] Office [phone] 09/19/2016 3:00 PM

## 2016-09-19 NOTE — Progress Notes (Signed)
Foley catheter discontinued and patient tolerated it well. Peri care provided. Will let the incoming nurse monitor next voiding.

## 2016-09-20 NOTE — Progress Notes (Signed)
15 Days Post-Op Procedure(s) (LRB): AORTIC VALVE REPLACEMENT (AVR) USING 23 MM MAGNA EASE PERICARDIAL TISSUE VALVE (N/A) TRANSESOPHAGEAL ECHOCARDIOGRAM (TEE) (N/A) CORONARY ARTERY BYPASS GRAFTING (CABG) USING LIMA TO DISTAL LAD AND ENDOSCOPICALLY HARVESTED GREATER SAPHENOUS VEIN TO CIRC. (N/A) ENDOVEIN HARVEST OF GREATER SAPHENOUS VEIN (Right) Subjective: Still struggling with ambulation but stable  Objective: Vital signs in last 24 hours: Temp:  [97.8 F (36.6 C)-98.7 F (37.1 C)] 98.6 F (37 C) (08/14 0511) Pulse Rate:  [70-79] 77 (08/14 0511) Cardiac Rhythm: Normal sinus rhythm (08/14 0402) Resp:  [17-19] 17 (08/14 0511) BP: (95-135)/(54-74) 99/64 (08/14 0511) SpO2:  [95 %-100 %] 98 % (08/14 0511) FiO2 (%):  [32 %] 32 % (08/13 1107) Weight:  [188 lb 4.8 oz (85.4 kg)] 188 lb 4.8 oz (85.4 kg) (08/14 0511)  Hemodynamic parameters for last 24 hours:    Intake/Output from previous day: 08/13 0701 - 08/14 0700 In: 480 [P.O.:480] Out: 4050 [Urine:4050] Intake/Output this shift: No intake/output data recorded.  General appearance: alert, cooperative and no distress Heart: regular rate and rhythm Lungs: fair air exchange throughout Abdomen: benign Extremities: + LE edema Wound: incis healing well  Lab Results:  Recent Labs  09/19/16 0938  WBC 9.1  HGB 8.3*  HCT 25.6*  PLT 499*   BMET:  Recent Labs  09/19/16 0938  NA 137  K 4.1  CL 96*  CO2 33*  GLUCOSE 139*  BUN 6  CREATININE 0.49*  CALCIUM 8.8*    PT/INR: No results for input(s): LABPROT, INR in the last 72 hours. ABG    Component Value Date/Time   PHART 7.448 09/12/2016 1610   HCO3 31.4 (H) 09/12/2016 1610   TCO2 24 09/06/2016 1639   ACIDBASEDEF 3.0 (H) 09/05/2016 2325   O2SAT 91.5 09/12/2016 1610   CBG (last 3)  No results for input(s): GLUCAP in the last 72 hours.  Meds Scheduled Meds: . amiodarone  200 mg Oral BID  . apixaban  5 mg Oral BID  . arformoterol  15 mcg Nebulization BID  .  aspirin EC  81 mg Oral Daily   Or  . aspirin  81 mg Per Tube Daily  . atorvastatin  40 mg Oral Daily  . bisacodyl  10 mg Oral Daily   Or  . bisacodyl  10 mg Rectal Daily  . budesonide (PULMICORT) nebulizer solution  0.5 mg Nebulization BID  . Chlorhexidine Gluconate Cloth  6 each Topical Daily  . diltiazem  120 mg Oral Daily  . docusate sodium  200 mg Oral Daily  . ferrous sulfate  325 mg Oral BID WC  . folic acid  1 mg Oral Daily  . furosemide  40 mg Oral Daily  . ipratropium  0.5 mg Nebulization BID  . loratadine  10 mg Oral Daily  . mouth rinse  15 mL Mouth Rinse BID  . pantoprazole  40 mg Oral Daily  . potassium chloride  40 mEq Oral Daily  . sodium chloride flush  10-40 mL Intracatheter Q12H  . sodium chloride flush  3 mL Intravenous Q12H  . tamsulosin  0.4 mg Oral QPC supper   Continuous Infusions: . sodium chloride 250 mL (09/19/16 2116)  . cefTRIAXone (ROCEPHIN)  IV 1 g (09/19/16 1742)   PRN Meds:.sodium chloride, acetaminophen, alum & mag hydroxide-simeth, fluticasone, guaiFENesin-dextromethorphan, levalbuterol, magnesium hydroxide, ondansetron (ZOFRAN) IV, oxyCODONE, sodium chloride flush, sodium chloride flush, traMADol  Xrays No results found.  Assessment/Plan: S/P Procedure(s) (LRB): AORTIC VALVE REPLACEMENT (AVR) USING 23 MM MAGNA EASE  PERICARDIAL TISSUE VALVE (N/A) TRANSESOPHAGEAL ECHOCARDIOGRAM (TEE) (N/A) CORONARY ARTERY BYPASS GRAFTING (CABG) USING LIMA TO DISTAL LAD AND ENDOSCOPICALLY HARVESTED GREATER SAPHENOUS VEIN TO CIRC. (N/A) ENDOVEIN HARVEST OF GREATER SAPHENOUS VEIN (Right)   1 stable overall , but progress is slow 2 will see how ambulation goes this am 3 rhythm stable 4 rocephin for UTI 5 poss d/c  LOS: 15 days    [name],Daniel E 09/20/2016

## 2016-09-20 NOTE — Care Management Note (Signed)
Case Management Note Previous CM note initiated by Lawerance Sabalebbie Swist, RN 09/16/2016, 11:00 AM  Patient Details  Name: Daniel BoutonKenneth D Gipson MRN: [id number] Date of Birth: 1950/12/31  Subjective/Objective:                 S/P CABG x 2, AVR, self resolved A fib per progression report. New COPD. From home w wife, independent pta. Anticipate oxygen needs at DC as well [facility] for disease management and possibly PT.    Action/Plan:  CM will continue to follow and offer HH choice today, [place] He stated he needed some time to look over it prior to deciding. [facility] needed at time of DC. Will need saturations with in 24 hours of DC.   [facility].  Expected Discharge Date:  09/20/16               Expected Discharge Plan:  Home w Home Health Services  In-House Referral:     Discharge planning Services  CM Consult  Post Acute Care Choice:  Durable Medical Equipment Choice offered to:  Patient  DME Arranged:  Oxygen, Nebulizer machine, Walker rolling DME Agency:  [facility]  HH Arranged:    HH Agency:     Status of Service:  Completed, signed off  If discussed at Long Length of Stay Meetings, dates discussed:    Discharge Disposition: home/self care  Additional Comments:  09/20/16- 1330- Donn PieriniKristi Heydy Montilla RN, CM- pt for d/c home today with wife- have notified Clydie BraunKaren [facility]- including portable 02 tank, RW ,and nebulizer.   09/19/16- 1100- Tahjae Clausing RN, CM-  Referral received for home nebulizer- order placed - pt will also need home 02 and RW- [facility] who [facility] following for D[facility] needs- [facility] per pt choice for any HH needs if ordered.   Zenda AlpersWebster, FriendshipKristi Hall, RN 09/20/2016, 2:33 PM [phone]

## 2016-09-20 NOTE — Progress Notes (Signed)
Chest tube sutures removed and site painted with betadine.   Patient tolerated well.  Governor SpeckingKathryn Gresham Caetano, RN

## 2016-09-20 NOTE — Progress Notes (Signed)
Discharge instructions given to patient and wife, questions answered.    Going home with walker, o2 tanks x 2  &  Nebulizer.    Instructions given to patient  For nebulizer by delivery gentleman.  Family voiced understanding.  Governor SpeckingKathryn Jenai Scaletta, RN

## 2016-09-20 NOTE — Progress Notes (Signed)
CARDIAC REHAB PHASE I   PRE:  Rate/Rhythm: 88 SR  BP:  Sitting: 113/67        SaO2: 100% on 3L at rest, 85% on RA c/ ambulation, 87% on 2L c/ ambulation, 88-91%   on 3L c/ ambulation  MODE:  Ambulation: 370 ft   POST:  Rate/Rhythm: 92 SR  BP:  Sitting: 111/92         SaO2: 88-91% on 3L c/ ambulation, 98% on 3L at rest  Pt ambulated 370 ft on RA-3L O2 (see sats above), rolling walker, assist x1, steady gait, tolerated well. Pt c/o DOE, standing rest x3. Pt to recliner after walk, call bell within reach.   SATURATION QUALIFICATIONS: (This note is used to comply with regulatory documentation for home oxygen)  Patient Saturations on Room Air at Rest = 90%  Patient Saturations on Room Air while Ambulating = 85%  Patient Saturations on 3 Liters of oxygen while Ambulating = 88-91%  Please briefly explain why patient needs home oxygen: Pt requiring oxygen to maintain sats during ambulation.   [phone] Joylene GrapesEmily C [name], RN, BSN 09/20/2016 10:09 AM

## 2016-09-22 ENCOUNTER — Encounter: Payer: Self-pay | Admitting: Internal Medicine

## 2016-09-28 NOTE — Telephone Encounter (Signed)
Error

## 2016-10-05 ENCOUNTER — Ambulatory Visit (INDEPENDENT_AMBULATORY_CARE_PROVIDER_SITE_OTHER): Payer: BLUE CROSS/BLUE SHIELD | Admitting: Cardiovascular Disease

## 2016-10-05 ENCOUNTER — Encounter: Payer: Self-pay | Admitting: Cardiovascular Disease

## 2016-10-05 VITALS — BP 112/80 | HR 92 | Ht 69.0 in | Wt 185.0 lb

## 2016-10-05 DIAGNOSIS — E782 Mixed hyperlipidemia: Secondary | ICD-10-CM | POA: Diagnosis not present

## 2016-10-05 DIAGNOSIS — I480 Paroxysmal atrial fibrillation: Secondary | ICD-10-CM | POA: Diagnosis not present

## 2016-10-05 DIAGNOSIS — Z951 Presence of aortocoronary bypass graft: Secondary | ICD-10-CM

## 2016-10-05 DIAGNOSIS — I252 Old myocardial infarction: Secondary | ICD-10-CM

## 2016-10-05 DIAGNOSIS — Z952 Presence of prosthetic heart valve: Secondary | ICD-10-CM | POA: Diagnosis not present

## 2016-10-05 DIAGNOSIS — I10 Essential (primary) hypertension: Secondary | ICD-10-CM

## 2016-10-05 DIAGNOSIS — I25708 Atherosclerosis of coronary artery bypass graft(s), unspecified, with other forms of angina pectoris: Secondary | ICD-10-CM

## 2016-10-05 DIAGNOSIS — I714 Abdominal aortic aneurysm, without rupture, unspecified: Secondary | ICD-10-CM

## 2016-10-05 NOTE — Patient Instructions (Signed)
Medication Instructions:  Your physician recommends that you continue on your current medications as directed. Please refer to the Current Medication list given to you today.  Labwork: none  Testing/Procedures: none  Follow-Up: Your physician wants you to follow-up in: 4 months with Dr. Reggy EyeKoneswaran You will receive a reminder letter in the mail two months in advance. If you don't receive a letter, please call our office to schedule the follow-up appointment.  Any Other Special Instructions Will Be Listed Below (If Applicable).  If you need a refill on your cardiac medications before your next appointment, please call your pharmacy.

## 2016-10-05 NOTE — Progress Notes (Signed)
SUBJECTIVE: The patient presents for posthospitalization follow-up. He underwent 2 vessel CABG with LIMA to the LAD and SVG to the circumflex on 09/06/16. He also went aortic valve replacement with a 23 mm Magna Ease pericardial tissue valve. He developed postoperative atrial fibrillation and flutter and was started on amiodarone.  He is doing well overall. He denies palpitations. He has some musculoskeletal chest wall pain when changing positions in bed. It has not been bothersome. He has oxygen but only uses it as needed. He denies worsening shortness of breath from baseline. He also denies leg swelling.  ECG performed in the office today which I personally interpreted demonstrated sinus rhythm with inferolateral T wave inversions.  Echocardiogram 09/14/16: Vigorous left ventricular systolic function, LVEF 65-70%, normal regional wall motion, normally functioning 23 mm Magna Ease pericardial bioprosthesis. The left atrium and right atrium and right ventricle were mildly dilated.  He has had some issues with mild nosebleeds.  Review of Systems: As per "subjective", otherwise negative.  Allergies  Allergen Reactions  . Ciprofloxacin     Current Outpatient Prescriptions  Medication Sig Dispense Refill  . acetaminophen (TYLENOL) 500 MG tablet Take 500 mg by mouth every 6 (six) hours as needed.    [name] albuterol (VENTOLIN HFA) 108 (90 Base) MCG/ACT inhaler INHALE TWO PUFFS BY MOUTH EVERY 4 HOURS AS NEEDED (Patient taking differently: Inhale 1 puff into the lungs every 4 (four) hours as needed for shortness of breath. INHALE TWO PUFFS BY MOUTH EVERY 4 HOURS AS NEEDED) 18 each 5  . amiodarone (PACERONE) 200 MG tablet Take 1 tablet (200 mg total) by mouth daily. 30 tablet 1  . apixaban (ELIQUIS) 5 MG TABS tablet Take 1 tablet (5 mg total) by mouth 2 (two) times daily. 60 tablet 1  . aspirin 81 MG tablet Take 81 mg by mouth at bedtime.     [name] atorvastatin (LIPITOR) 40 MG tablet Take 1 tablet (40  mg total) by mouth daily. 30 tablet 5  . budesonide (PULMICORT) 0.5 MG/2ML nebulizer solution Take 2 mLs (0.5 mg total) by nebulization 2 (two) times daily. 120 mL 1  . diltiazem (CARDIZEM CD) 120 MG 24 hr capsule Take 1 capsule (120 mg total) by mouth daily. 30 capsule 1  . docusate sodium (COLACE) 100 MG capsule Take 100 mg by mouth daily.    . ferrous sulfate 325 (65 FE) MG tablet Take 1 tablet (325 mg total) by mouth 2 (two) times daily with a meal. 60 tablet 3  . fexofenadine (ALLEGRA) 180 MG tablet Take 180 mg by mouth daily.    . folic acid (FOLVITE) 1 MG tablet Take 1 tablet (1 mg total) by mouth daily. 30 tablet 1  . furosemide (LASIX) 40 MG tablet Take 1 tablet (40 mg total) by mouth daily. 30 tablet 0  . ipratropium (ATROVENT) 0.02 % nebulizer solution Take 2.5 mLs (0.5 mg total) by nebulization 2 (two) times daily. 75 mL 1  . levalbuterol (XOPENEX) 0.63 MG/3ML nebulizer solution Take 3 mLs (0.63 mg total) by nebulization every 3 (three) hours as needed for wheezing or shortness of breath. 90 mL 1  . mometasone (NASONEX) 50 MCG/ACT nasal spray Place 2 sprays into the nose daily as needed (for allergies). (Patient taking differently: Place 1 spray into the nose daily as needed (for allergies). ) 17 g 5  . Multiple Vitamin (MULTIVITAMIN) tablet Take 1 tablet by mouth daily.    [name] oxyCODONE (OXY IR/ROXICODONE) 5 MG immediate release  tablet Take 1-2 tablets (5-10 mg total) by mouth every 6 (six) hours as needed for severe pain. 30 tablet 0  . potassium chloride SA (K-DUR,KLOR-CON) 20 MEQ tablet Take 1 tablet (20 mEq total) by mouth daily. 30 tablet 0  . tamsulosin (FLOMAX) 0.4 MG CAPS capsule Take 1 capsule (0.4 mg total) by mouth daily after supper. 30 capsule 1  . tetrahydrozoline 0.05 % ophthalmic solution Place 2 drops into both eyes daily as needed (dry, red eye).    [name] tiotropium (SPIRIVA HANDIHALER) 18 MCG inhalation capsule Place 1 capsule (18 mcg total) into inhaler and inhale daily. 30  capsule 5  . vitamin C (ASCORBIC ACID) 500 MG tablet Take 500 mg by mouth daily. Reported on 07/16/2015     No current facility-administered medications for this visit.     Past Medical History:  Diagnosis Date  . AAA (abdominal aortic aneurysm) (HCC)   . COPD (chronic obstructive pulmonary disease) (HCC)   . Fatty liver   . Hyperlipidemia   . Hypertension   . Severe aortic stenosis 08/29/2016  . White coat hypertension     Past Surgical History:  Procedure Laterality Date  . ANKLE SURGERY Right 2009  . AORTIC VALVE REPLACEMENT N/A 09/05/2016   Procedure: AORTIC VALVE REPLACEMENT (AVR) USING 23 MM MAGNA EASE PERICARDIAL TISSUE VALVE;  Surgeon: Delight Ovens, MD;  Location[facility];  Service: Open Heart Surgery;  Laterality: N/A;  . APPENDECTOMY    . CARDIAC CATHETERIZATION N/A 02/02/2016   Procedure: Left Heart Cath and Coronary Angiography;  Surgeon: Rinaldo Cloud, MD;  Location: [facility] INVASIVE CV LAB;  Service: Cardiovascular;  Laterality: N/A;  . COLONOSCOPY  06/22/2010   [id number] rectal and right colon polyps removed remainder rectum and colon appared normal  . COLONOSCOPY WITH PROPOFOL N/A 05/15/2014   Procedure: COLONOSCOPY WITH PROPOFOL;  Surgeon: Corbin Ade, MD;  Location: [facility] ORS;  Service: Endoscopy;  Laterality: N/A;  In cecum @ [id number], out @ 0937, withdrawal time 19 minutes  . CORONARY ARTERY BYPASS GRAFT N/A 09/05/2016   Procedure: CORONARY ARTERY BYPASS GRAFTING (CABG) USING LIMA TO DISTAL LAD AND ENDOSCOPICALLY HARVESTED GREATER SAPHENOUS VEIN TO CIRC.;  Surgeon: Delight Ovens, MD;  Location: [facility] OR;  Service: Open Heart Surgery;  Laterality: N/A;  . ENDOVEIN HARVEST OF GREATER SAPHENOUS VEIN Right 09/05/2016   Procedure: ENDOVEIN HARVEST OF GREATER SAPHENOUS VEIN;  Surgeon: Delight Ovens, MD;  Location[facility] OR;  Service: Open Heart Surgery;  Laterality: Right;  . POLYPECTOMY N/A 05/15/2014   Procedure: POLYPECTOMY;  Surgeon: Corbin Ade, MD;  Location: [facility] ORS;   Service: Endoscopy;  Laterality: N/A;  . RIGHT/LEFT HEART CATH AND CORONARY ANGIOGRAPHY N/A 08/29/2016   Procedure: Right/Left Heart Cath and Coronary Angiography;  Surgeon: Tonny Bollman, MD;  Location: [facility] INVASIVE CV LAB;  Service: Cardiovascular;  Laterality: N/A;  . TEE WITHOUT CARDIOVERSION N/A 09/05/2016   Procedure: TRANSESOPHAGEAL ECHOCARDIOGRAM (TEE);  Surgeon: Delight Ovens, MD;  [facility] OR;  Service: Open Heart Surgery;  Laterality: N/A;    Social History   Social History  . Marital status: Married    Spouse name: N/A  . Number of children: N/A  . Years of education: N/A   Occupational History  . Not on file.   Social History Main Topics  . Smoking status: Former Smoker    Packs/day: 1.50    Years: 40.00    Types: Cigarettes    Quit date: 04/16/2002  . Smokeless tobacco: Never  Used  . Alcohol use 3.0 oz/week    5 Cans of beer per week     Comment: per pt  . Drug use: No  . Sexual activity: No   Other Topics Concern  . 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 Currently has an outdoor cat and no indoor pets. No bird or mold exposure.     Vitals:   10/05/16 0921  BP: 112/80  Pulse: 92  SpO2: 96%  Weight: 185 lb (83.9 kg)  Height: 5\' 9"  (1.753 m)    Wt Readings from Last 3 Encounters:  10/05/16 185 lb (83.9 kg)  09/20/16 188 lb 4.8 oz (85.4 kg)  09/01/16 190 lb (86.2 kg)     PHYSICAL EXAM General: NAD HEENT: Normal. Neck: No JVD, no thyromegaly. Lungs: Clear to auscultation bilaterally with normal respiratory effort. CV: Nondisplaced PMI.  Regular rate and rhythm, normal S1/prosthetic S2, no S3/S4, no murmur. No pretibial or periankle edema.  No carotid bruit.   Abdomen: Soft, nontender, no distention.  Neurologic: Alert and oriented.  Psych: Normal affect. Skin: Normal. Musculoskeletal: No gross deformities.    ECG: Most recent  ECG reviewed.   Labs: Lab Results  Component Value Date/Time   K 4.1 09/19/2016 09:38 AM   BUN 6 09/19/2016 09:38 AM   BUN 9 05/18/2015 08:34 AM   CREATININE 0.49 (L) 09/19/2016 09:38 AM   CREATININE 0.72 08/05/2016 09:58 AM   ALT 14 (L) 09/09/2016 05:45 AM   TSH 1.701 09/12/2016 01:19 PM   HGB 8.3 (L) 09/19/2016 09:38 AM   HGB 15.8 06/13/2015 09:06 AM     Lipids: Lab Results  Component Value Date/Time   LDLCALC 56 05/16/2016 08:56 AM   LDLCALC 70 05/18/2015 08:34 AM   CHOL 136 05/16/2016 08:56 AM   CHOL 128 05/18/2015 08:34 AM   TRIG 225 (H) 05/16/2016 08:56 AM   HDL 35 (L) 05/16/2016 08:56 AM   HDL 43 05/18/2015 08:34 AM       ASSESSMENT AND PLAN:  1. Coronary artery disease status post two-vessel CABG: Symptomatically stable. Continue aspirin and statin. He is scheduled to undergo cardiac rehabilitation.  2. Status post aortic valve replacement with a bioprosthetic valve: Stable. Normally functioning by recent echo detailed above.  3. Paroxysmal atrial fibrillation and flutter: Currently on amiodarone 200 mg daily. Also on apixaban 5 mg twice daily. Continue long-acting Cardizem. If he is in sinus rhythm at his next visit I will discontinue amiodarone. He is at risk for further episodes of paroxysmal atrial fibrillation and flutter given his long history of COPD.  4. Hypertension: Controlled. No changes.  5. Hyperlipidemia: Continue statin. LDL 70 on 05/17/16.  6. Abdominal aortic aneurysm: Abdominal aortic ultrasound on 07/28/16 demonstrated AAA 4.7 x 4.7 cm in diameter. Follow-up ultrasound in June 2019.     Disposition: Follow up December 2018.   Prentice DockerSuresh Bari Handshoe, M.D., F.A.C.C.

## 2016-10-14 ENCOUNTER — Ambulatory Visit (INDEPENDENT_AMBULATORY_CARE_PROVIDER_SITE_OTHER): Payer: BLUE CROSS/BLUE SHIELD | Admitting: Family Medicine

## 2016-10-14 ENCOUNTER — Encounter: Payer: Self-pay | Admitting: Family Medicine

## 2016-10-14 VITALS — BP 148/84 | Temp 98.5°F | Ht 69.0 in | Wt 181.0 lb

## 2016-10-14 DIAGNOSIS — E7849 Other hyperlipidemia: Secondary | ICD-10-CM

## 2016-10-14 DIAGNOSIS — E784 Other hyperlipidemia: Secondary | ICD-10-CM

## 2016-10-14 DIAGNOSIS — R300 Dysuria: Secondary | ICD-10-CM

## 2016-10-14 DIAGNOSIS — D509 Iron deficiency anemia, unspecified: Secondary | ICD-10-CM

## 2016-10-14 DIAGNOSIS — I10 Essential (primary) hypertension: Secondary | ICD-10-CM | POA: Diagnosis not present

## 2016-10-14 DIAGNOSIS — Z125 Encounter for screening for malignant neoplasm of prostate: Secondary | ICD-10-CM | POA: Diagnosis not present

## 2016-10-14 DIAGNOSIS — N3000 Acute cystitis without hematuria: Secondary | ICD-10-CM | POA: Diagnosis not present

## 2016-10-14 LAB — POCT URINALYSIS DIPSTICK
Blood, UA: NEGATIVE
Spec Grav, UA: 1.015 (ref 1.010–1.025)
pH, UA: 6 (ref 5.0–8.0)

## 2016-10-14 MED ORDER — CEFPROZIL 500 MG PO TABS: 500.0000 mg | ORAL_TABLET | Freq: Two times a day (BID) | ORAL | 0 refills | Status: DC

## 2016-10-14 NOTE — Progress Notes (Signed)
   Subjective:    Patient ID: Daniel BoutonKenneth D Azer, male    DOB: 06-06-1950, 66 y.o.   MRN: [id number]  Urinary Tract Infection   Associated symptoms include frequency. Pertinent negatives include no hematuria.  Patient here today reports having symptoms of UTI for the last two days. He reports burning,stinging and frequency. He has not tried anything for it. Patient recently had cardiac surgery valve replacement Denies chest pressure tightness pain fever chills sweats wheezing difficulty breathing Denies hematuria rectal bleeding   Review of Systems  Constitutional: Negative for activity change, appetite change and fatigue.  HENT: Negative for congestion.   Respiratory: Negative for cough.   Cardiovascular: Negative for chest pain.  Gastrointestinal: Negative for abdominal pain, anal bleeding and blood in stool.  Endocrine: Negative for polydipsia and polyphagia.  Genitourinary: Positive for dysuria and frequency. Negative for hematuria.  Neurological: Negative for weakness.  Psychiatric/Behavioral: Negative for confusion.   Results for orders placed or performed in visit on 10/14/16  POCT urinalysis dipstick  Result Value Ref Range   Color, UA     Clarity, UA     Glucose, UA     Bilirubin, UA     Ketones, UA     Spec Grav, UA 1.015 1.010 - 1.025   Blood, UA negative    pH, UA 6.0 5.0 - 8.0   Protein, UA     Urobilinogen, UA  0.2 or 1.0 E.U./dL   Nitrite, UA     Leukocytes, UA Moderate (2+) (A) Negative       Objective:   Physical Exam  Constitutional: He appears well-nourished. No distress.  Cardiovascular: Normal rate, regular rhythm and normal heart sounds.   No murmur heard. Pulmonary/Chest: Effort normal and breath sounds normal. No respiratory distress.  Musculoskeletal: He exhibits no edema.  Lymphadenopathy:    He has no cervical adenopathy.  Neurological: He is alert.  Psychiatric: His behavior is normal.  Vitals reviewed.  Patient not toxic         Assessment & Plan:  UTI Antibiotics for the next 2 weeks Preventative lab work in October Will repeat urinalysis and urine culture on follow-up in 4 weeks for his wellness I encouraged patient if he starts running any high fevers chills flank pains or abdominal pain immediately get checked out here or ER because of his recent heart surgery do not 1 valve to get infected

## 2016-10-14 NOTE — Patient Instructions (Signed)
Urinary Tract Infection, Adult °A urinary tract infection (UTI) is an infection of any part of the urinary tract. The urinary tract includes the: °· Kidneys. °· Ureters. °· Bladder. °· Urethra. ° °These organs make, store, and get rid of pee (urine) in the body. °Follow these instructions at home: °· Take over-the-counter and prescription medicines only as told by your doctor. °· If you were prescribed an antibiotic medicine, take it as told by your doctor. Do not stop taking the antibiotic even if you start to feel better. °· Avoid the following drinks: °? Alcohol. °? Caffeine. °? Tea. °? Carbonated drinks. °· Drink enough fluid to keep your pee clear or pale yellow. °· Keep all follow-up visits as told by your doctor. This is important. °· Make sure to: °? Empty your bladder often and completely. Do not to hold pee for long periods of time. °? Empty your bladder before and after sex. °? Wipe from front to back after a bowel movement if you are male. Use each tissue one time when you wipe. °Contact a doctor if: °· You have back pain. °· You have a fever. °· You feel sick to your stomach (nauseous). °· You throw up (vomit). °· Your symptoms do not get better after 3 days. °· Your symptoms go away and then come back. °Get help right away if: °· You have very bad back pain. °· You have very bad lower belly (abdominal) pain. °· You are throwing up and cannot keep down any medicines or water. °This information is not intended to replace advice given to you by your health care provider. Make sure you discuss any questions you have with your health care provider. °Document Released: 07/13/2007 Document Revised: 07/02/2015 Document Reviewed: 12/15/2014 °Elsevier Interactive Patient Education © 2018 Else[employer] Inc. ° °

## 2016-10-15 ENCOUNTER — Encounter: Payer: Self-pay | Admitting: Family Medicine

## 2016-10-17 LAB — URINE CULTURE

## 2016-10-18 ENCOUNTER — Other Ambulatory Visit: Payer: Self-pay

## 2016-10-18 ENCOUNTER — Other Ambulatory Visit: Payer: Self-pay | Admitting: Family Medicine

## 2016-10-18 DIAGNOSIS — I25118 Atherosclerotic heart disease of native coronary artery with other forms of angina pectoris: Secondary | ICD-10-CM

## 2016-10-20 ENCOUNTER — Ambulatory Visit: Payer: BLUE CROSS/BLUE SHIELD | Admitting: Cardiothoracic Surgery

## 2016-10-24 ENCOUNTER — Ambulatory Visit
Admission: RE | Admit: 2016-10-24 | Discharge: 2016-10-24 | Disposition: A | Discharge status: Home / Self Care | Payer: BLUE CROSS/BLUE SHIELD | Source: Ambulatory Visit | Attending: Cardiothoracic Surgery | Admitting: Cardiothoracic Surgery

## 2016-10-24 ENCOUNTER — Encounter: Payer: Self-pay | Admitting: Physician Assistant

## 2016-10-24 ENCOUNTER — Ambulatory Visit (INDEPENDENT_AMBULATORY_CARE_PROVIDER_SITE_OTHER): Payer: Self-pay | Admitting: Physician Assistant

## 2016-10-24 VITALS — BP 155/94 | HR 87 | Resp 16 | Ht 69.0 in | Wt 186.2 lb

## 2016-10-24 DIAGNOSIS — I2510 Atherosclerotic heart disease of native coronary artery without angina pectoris: Secondary | ICD-10-CM

## 2016-10-24 DIAGNOSIS — I350 Nonrheumatic aortic (valve) stenosis: Secondary | ICD-10-CM

## 2016-10-24 DIAGNOSIS — Z952 Presence of prosthetic heart valve: Secondary | ICD-10-CM

## 2016-10-24 DIAGNOSIS — I25118 Atherosclerotic heart disease of native coronary artery with other forms of angina pectoris: Secondary | ICD-10-CM

## 2016-10-24 DIAGNOSIS — Z951 Presence of aortocoronary bypass graft: Secondary | ICD-10-CM

## 2016-10-24 MED ORDER — DILTIAZEM HCL ER COATED BEADS 120 MG PO CP24: 120.0000 mg | ORAL_CAPSULE | Freq: Every day | ORAL | 1 refills | Status: DC

## 2016-10-24 NOTE — Patient Instructions (Addendum)
You may return to driving an automobile as long as you are no longer requiring oral narcotic pain relievers during the daytime.  It would be wise to start driving only short distances during the daylight and gradually increase from there as you feel comfortable.  You are encouraged to enroll and participate in the outpatient cardiac rehab program beginning as soon as practical.  Endocarditis is a potentially serious infection of heart valves or inside lining of the heart.  It occurs more commonly in patients with diseased heart valves (such as patient's with aortic or mitral valve disease) and in patients who have undergone heart valve repair or replacement.  Certain surgical and dental procedures may put you at risk, such as dental cleaning, other dental procedures, or any surgery involving the respiratory, urinary, gastrointestinal tract, gallbladder or prostate gland.   To minimize your chances for develooping endocarditis, maintain good oral health and seek prompt medical attention for any infections involving the mouth, teeth, gums, skin or urinary tract.    Always notify your doctor or dentist about your underlying heart valve condition before having any invasive procedures. You will need to take antibiotics before certain procedures, including all routine dental cleanings or other dental procedures.  Your cardiologist or dentist should prescribe these antibiotics for you to be taken ahead of time.  Continue to avoid any heavy lifting or strenuous use of your arms or shoulders for at least a total of three months from the time of surgery.  After three months, you may gradually increase how much you lift or otherwise use your arms or chest as tolerated, with limits based upon whether or not activities lead to the return of significant discomfort.  Prediabetes Eating Plan Prediabetes-also called impaired glucose tolerance or impaired fasting glucose-is a condition that causes blood sugar (blood  glucose) levels to be higher than normal. Following a healthy diet can help to keep prediabetes under control. It can also help to lower the risk of type 2 diabetes and heart disease, which are increased in people who have prediabetes. Along with regular exercise, a healthy diet:  Promotes weight loss.  Helps to control blood sugar levels.  Helps to improve the way that the body uses insulin.  What do I need to know about this eating plan?  Use the glycemic index (GI) to plan your meals. The index tells you how quickly a food will raise your blood sugar. Choose low-GI foods. These foods take a longer time to raise blood sugar.  Pay close attention to the amount of carbohydrates in the food that you eat. Carbohydrates increase blood sugar levels.  Keep track of how many calories you take in. Eating the right amount of calories will help you to achieve a healthy weight. Losing about 7 percent of your starting weight can help to prevent type 2 diabetes.  You may want to follow a Mediterranean diet. This diet includes a lot of vegetables, lean meats or fish, whole grains, fruits, and healthy oils and fats. What foods can I eat? Grains Whole grains, such as whole-wheat or whole-grain breads, crackers, cereals, and pasta. Unsweetened oatmeal. Bulgur. Barley. Quinoa. Brown rice. Corn or whole-wheat flour tortillas or taco shells. Vegetables Lettuce. Spinach. Peas. Beets. Cauliflower. Cabbage. Broccoli. Carrots. Tomatoes. Squash. Eggplant. Herbs. Peppers. Onions. Cucumbers. Brussels sprouts. Fruits Berries. Bananas. Apples. Oranges. Grapes. Papaya. Mango. Pomegranate. Kiwi. Grapefruit. Cherries. Meats and Other Protein Sources Seafood. Lean meats, such as chicken and [place] or lean cuts of pork and beef. Tofu.  Eggs. Nuts. Beans. Dairy Low-fat or fat-free dairy products, such as yogurt, cottage cheese, and cheese. Beverages Water. Tea. Coffee. Sugar-free or diet soda. Seltzer water. Milk. Milk  alternatives, such as soy or almond milk. Condiments Mustard. Relish. Low-fat, low-sugar ketchup. Low-fat, low-sugar barbecue sauce. Low-fat or fat-free mayonnaise. Sweets and Desserts Sugar-free or low-fat pudding. Sugar-free or low-fat ice cream and other frozen treats. Fats and Oils Avocado. Walnuts. Olive oil. The items listed above may not be a complete list of recommended foods or beverages. Contact your dietitian for more options. What foods are not recommended? Grains Refined white flour and flour products, such as bread, pasta, snack foods, and cereals. Beverages Sweetened drinks, such as sweet iced tea and soda. Sweets and Desserts Baked goods, such as cake, cupcakes, pastries, cookies, and cheesecake. The items listed above may not be a complete list of foods and beverages to avoid. Contact your dietitian for more information. This information is not intended to replace advice given to you by your health care provider. Make sure you discuss any questions you have with your health care provider. Document Released: 06/10/2014 Document Revised: 07/02/2015 Document Reviewed: 02/19/2014 Elsevier Interactive Patient Education  2017 [employer].

## 2016-10-24 NOTE — Progress Notes (Addendum)
Surgery: Coronary artery bypass grafting x2, with left internal mammary to the distal left anterior descending coronary artery and reverse saphenous vein graft to the circumflex coronary artery and aortic valve replacement with a pericardial tissue valve, [employer] 23-mm, model 3300TFX, serial number [id number]. Right Thigh endovein harvesting of right Greater Saphenous Vein on 09/05/2016.  HPI:  Patient returns for routine postoperative follow-up having undergone a CABG x 2 and AVR by Dr. Tyrone Sage on 09/05/2016. The patient's early postoperative recovery while in the hospital was notable for atrial fibrillation and E. Coli UTI. Since hospital discharge the patient reports he was treated for another UTI on 10/14/2016. He denies chest pain, shortness of breath, fever, or chills.  Current Outpatient Prescriptions  Medication Sig Dispense Refill  . acetaminophen (TYLENOL) 500 MG tablet Take 500 mg by mouth every 6 (six) hours as needed.    [name] amiodarone (PACERONE) 200 MG tablet Take 1 tablet (200 mg total) by mouth daily. 30 tablet 1  . apixaban (ELIQUIS) 5 MG TABS tablet Take 1 tablet (5 mg total) by mouth 2 (two) times daily. 60 tablet 1  . aspirin 81 MG tablet Take 81 mg by mouth at bedtime.     [name] atorvastatin (LIPITOR) 40 MG tablet Take 1 tablet (40 mg total) by mouth daily. 30 tablet 5  . cefPROZIL (CEFZIL) 500 MG tablet Take 1 tablet (500 mg total) by mouth 2 (two) times daily. 28 tablet 0  . diltiazem (CARDIZEM CD) 120 MG 24 hr capsule Take 1 capsule (120 mg total) by mouth daily. 30 capsule 1  . docusate sodium (COLACE) 100 MG capsule Take 100 mg by mouth daily.    . ferrous sulfate 325 (65 FE) MG tablet Take 1 tablet (325 mg total) by mouth 2 (two) times daily with a meal. 60 tablet 3  . fexofenadine (ALLEGRA) 180 MG tablet Take 180 mg by mouth daily.    . folic acid (FOLVITE) 1 MG tablet Take 1 tablet (1 mg total) by mouth daily. 30 tablet 1  . levalbuterol (XOPENEX) 0.63  MG/3ML nebulizer solution Take 3 mLs (0.63 mg total) by nebulization every 3 (three) hours as needed for wheezing or shortness of breath. 90 mL 1  . mometasone (NASONEX) 50 MCG/ACT nasal spray Place 2 sprays into the nose daily as needed (for allergies). (Patient taking differently: Place 1 spray into the nose daily as needed (for allergies). ) 17 g 5  . Multiple Vitamin (MULTIVITAMIN) tablet Take 1 tablet by mouth daily.    . potassium chloride SA (K-DUR,KLOR-CON) 20 MEQ tablet Take 1 tablet (20 mEq total) by mouth daily. 30 tablet 0  . tamsulosin (FLOMAX) 0.4 MG CAPS capsule Take 1 capsule (0.4 mg total) by mouth daily after supper. 30 capsule 1  . tetrahydrozoline 0.05 % ophthalmic solution Place 2 drops into both eyes daily as needed (dry, red eye).    [name] tiotropium (SPIRIVA HANDIHALER) 18 MCG inhalation capsule Place 1 capsule (18 mcg total) into inhaler and inhale daily. 30 capsule 5  . VENTOLIN HFA 108 (90 Base) MCG/ACT inhaler INHALE 2 PUFFS BY MOUTH EVERY 4 HOURS AS NEEDED 18 g 2  . vitamin C (ASCORBIC ACID) 500 MG tablet Take 500 mg by mouth daily. Reported on 07/16/2015    Vital Signs: BP155/94, HR 87, RR 16, Oxygenation saturation 95% on room air.   Physical Exam: CV-RRR, no murmur Pulmonary-Clear to auscultation bilaterally Abdomen-Soft, non tender, bowel sounds present Extremities-No LE edema Wounds-Sternal and RLE wounds are well  healed. No sign of infection.  Diagnostic Tests: CLINICAL DATA:  Exertional shortness of breath.  Recent CABG.  EXAM: CHEST  2 VIEW  COMPARISON:  Chest x-ray dated September 16, 2016.  FINDINGS: Interval removal of right-sided PICC line. Postsurgical changes related to prior CABG and AVR. The cardiomediastinal silhouette is normal in size. Normal pulmonary vascularity. Emphysematous changes again noted in the right greater than left upper lobes. Stable scarring at the lung bases. Trace bilateral pleural effusions. No pneumothorax. No acute  osseous abnormality.  IMPRESSION: 1. Prior CABG. Trace bilateral pleural effusions. No pulmonary edema. 2. COPD.   Electronically Signed   By: Obie Dredge M.D.   On: 10/24/2016 12:52  Impression and Plan: Overall, Mr. Acton is recovering well from coronary artery bypass grafting surgery and aortic valve replacement (pericardial tissue valve). He has already seen Dr. Purvis Sheffield in follow up. Dr. Purvis Sheffield will determine length of Amiodarone and Eliquis (he remains in sinus rhythm). He has white coat hypertension and this is evidenced by SBP in the 150's at this visit. His wife and he inquired about stopping some of his other medications. He was instructed to not take Nitro and if he is having chest pain, call 911 and notify cardiologist. I have stopped Lasix, potassium (he was instructed to weight himself every morning and if he gains 2-3 pounds, he is to call our office or Dr. Purvis Sheffield), ferrous sulfate, folic acid, and Flomax. I did give him a prescription for Cardizem as he has no more refills. He is not taking any narcotics for pain. He was instructed he may begin driving short distances (i.e. No more than 30 minutes during the day) and gradually increase the frequency and duration as tolerates. He was instructed to continue with sternal precautions (i.e. No lifting more than 10 pounds for at least 1-2 mores weeks) then may gradually resume activities. He was encouraged to participate in cardiac rehab, which he is agreeable. He was instructed his HGA1C was 5.8 prior to surgery and he is pre diabetic. I gave him some diet guidelines and he has an appointment to see his primary care doctor next month. Finally, because he had an aortic valve replacement, he will need an antibiotic prior to dental cleaning etc. He will follow up with Dr. Purvis Sheffield on 02/01/2017. Per his request, he will follow up with Dr. Tyrone Sage PRN.  Ardelle Balls, PA-C [facility] [phone]

## 2016-11-03 ENCOUNTER — Other Ambulatory Visit: Payer: Self-pay | Admitting: Surgical

## 2016-11-07 ENCOUNTER — Other Ambulatory Visit: Payer: Self-pay | Admitting: Cardiovascular Disease

## 2016-11-09 ENCOUNTER — Other Ambulatory Visit: Payer: Self-pay

## 2016-11-09 MED ORDER — AMIODARONE HCL 200 MG PO TABS: 200.0000 mg | ORAL_TABLET | Freq: Every day | ORAL | 1 refills | Status: DC

## 2016-11-14 DIAGNOSIS — I10 Essential (primary) hypertension: Secondary | ICD-10-CM | POA: Diagnosis not present

## 2016-11-14 DIAGNOSIS — D509 Iron deficiency anemia, unspecified: Secondary | ICD-10-CM | POA: Diagnosis not present

## 2016-11-14 DIAGNOSIS — Z125 Encounter for screening for malignant neoplasm of prostate: Secondary | ICD-10-CM | POA: Diagnosis not present

## 2016-11-14 DIAGNOSIS — E7849 Other hyperlipidemia: Secondary | ICD-10-CM | POA: Diagnosis not present

## 2016-11-15 LAB — FERRITIN: Ferritin: 41 ng/mL (ref 20–380)

## 2016-11-15 LAB — BASIC METABOLIC PANEL WITH GFR
BUN/Creatinine Ratio: 14 (calc) (ref 6–22)
BUN: 9 mg/dL (ref 7–25)
CO2: 31 mmol/L (ref 20–32)
Calcium: 9.7 mg/dL (ref 8.6–10.3)
Chloride: 105 mmol/L (ref 98–110)
Creat: 0.65 mg/dL — ABNORMAL LOW (ref 0.70–1.25)
GFR, Est African American: 119 mL/min/{1.73_m2} (ref >=60)
GFR, Est Non African American: 103 mL/min/{1.73_m2} (ref >=60)
Glucose, Bld: 97 mg/dL (ref 65–99)
Potassium: 4.6 mmol/L (ref 3.5–5.3)
Sodium: 143 mmol/L (ref 135–146)

## 2016-11-15 LAB — HEPATIC FUNCTION PANEL
AG Ratio: 1.8 (calc) (ref 1.0–2.5)
ALT: 18 U/L (ref 9–46)
AST: 22 U/L (ref 10–35)
Albumin: 4.5 g/dL (ref 3.6–5.1)
Alkaline phosphatase (APISO): 65 U/L (ref 40–115)
Bilirubin, Direct: 0.1 mg/dL (ref 0.0–0.2)
Globulin: 2.5 g/dL (calc) (ref 1.9–3.7)
Indirect Bilirubin: 0.3 mg/dL (calc) (ref 0.2–1.2)
Total Bilirubin: 0.4 mg/dL (ref 0.2–1.2)
Total Protein: 7 g/dL (ref 6.1–8.1)

## 2016-11-15 LAB — CBC WITH DIFFERENTIAL/PLATELET
Basophils Absolute: 72 cells/uL (ref 0–200)
Basophils Relative: 0.9 %
Eosinophils Absolute: 200 cells/uL (ref 15–500)
Eosinophils Relative: 2.5 %
HCT: 46.8 % (ref 38.5–50.0)
Hemoglobin: 15.3 g/dL (ref 13.2–17.1)
Lymphs Abs: 1640 cells/uL (ref 850–3900)
MCH: 27.8 pg (ref 27.0–33.0)
MCHC: 32.7 g/dL (ref 32.0–36.0)
MCV: 85.1 fL (ref 80.0–100.0)
MPV: 11.9 fL (ref 7.5–12.5)
Monocytes Relative: 11.7 %
Neutro Abs: 5152 cells/uL (ref 1500–7800)
Neutrophils Relative %: 64.4 %
Platelets: 233 10*3/uL (ref 140–400)
RBC: 5.5 10*6/uL (ref 4.20–5.80)
RDW: 13.6 % (ref 11.0–15.0)
Total Lymphocyte: 20.5 %
WBC mixed population: 936 cells/uL (ref 200–950)
WBC: 8 10*3/uL (ref 3.8–10.8)

## 2016-11-15 LAB — LIPID PANEL
Cholesterol: 153 mg/dL (ref <200)
HDL: 57 mg/dL (ref >40)
LDL Cholesterol (Calc): 81 mg/dL (calc)
Non-HDL Cholesterol (Calc): 96 mg/dL (calc) (ref <130)
Total CHOL/HDL Ratio: 2.7 (calc) (ref <5.0)
Triglycerides: 73 mg/dL (ref <150)

## 2016-11-15 LAB — PSA: PSA: 3.4 ng/mL (ref <=4.0)

## 2016-12-02 ENCOUNTER — Ambulatory Visit (INDEPENDENT_AMBULATORY_CARE_PROVIDER_SITE_OTHER): Payer: BLUE CROSS/BLUE SHIELD | Admitting: Family Medicine

## 2016-12-02 ENCOUNTER — Encounter: Payer: Self-pay | Admitting: Family Medicine

## 2016-12-02 VITALS — BP 148/92 | Ht 69.0 in | Wt 187.0 lb

## 2016-12-02 DIAGNOSIS — Z0000 Encounter for general adult medical examination without abnormal findings: Secondary | ICD-10-CM | POA: Diagnosis not present

## 2016-12-02 DIAGNOSIS — Z23 Encounter for immunization: Secondary | ICD-10-CM | POA: Diagnosis not present

## 2016-12-02 DIAGNOSIS — J449 Chronic obstructive pulmonary disease, unspecified: Secondary | ICD-10-CM

## 2016-12-02 DIAGNOSIS — N390 Urinary tract infection, site not specified: Secondary | ICD-10-CM | POA: Diagnosis not present

## 2016-12-02 DIAGNOSIS — I10 Essential (primary) hypertension: Secondary | ICD-10-CM | POA: Diagnosis not present

## 2016-12-02 DIAGNOSIS — Z125 Encounter for screening for malignant neoplasm of prostate: Secondary | ICD-10-CM

## 2016-12-02 DIAGNOSIS — Z79899 Other long term (current) drug therapy: Secondary | ICD-10-CM

## 2016-12-02 DIAGNOSIS — E7849 Other hyperlipidemia: Secondary | ICD-10-CM

## 2016-12-02 LAB — POCT URINALYSIS DIPSTICK
Spec Grav, UA: <=1.005 — AB (ref 1.010–1.025)
pH, UA: 7 (ref 5.0–8.0)

## 2016-12-02 MED ORDER — ATORVASTATIN CALCIUM 80 MG PO TABS: 80.0000 mg | ORAL_TABLET | Freq: Every day | ORAL | 5 refills | Status: DC

## 2016-12-02 MED ORDER — LISINOPRIL 10 MG PO TABS: 10.0000 mg | ORAL_TABLET | Freq: Every day | ORAL | 6 refills | Status: DC

## 2016-12-02 NOTE — Patient Instructions (Signed)
DASH Eating Plan DASH stands for "Dietary Approaches to Stop Hypertension." The DASH eating plan is a healthy eating plan that has been shown to reduce high blood pressure (hypertension). It may also reduce your risk for type 2 diabetes, heart disease, and stroke. The DASH eating plan may also help with weight loss. What are tips for following this plan? General guidelines  Avoid eating more than 2,300 mg (milligrams) of salt (sodium) a day. If you have hypertension, you may need to reduce your sodium intake to 1,500 mg a day.  Limit alcohol intake to no more than 1 drink a day for nonpregnant women and 2 drinks a day for men. One drink equals 12 oz of beer, 5 oz of wine, or 1 oz of hard liquor.  Work with your health care provider to maintain a healthy body weight or to lose weight. Ask what an ideal weight is for you.  Get at least 30 minutes of exercise that causes your heart to beat faster (aerobic exercise) most days of the week. Activities may include walking, swimming, or biking.  Work with your health care provider or diet and nutrition specialist (dietitian) to adjust your eating plan to your individual calorie needs. Reading food labels  Check food labels for the amount of sodium per serving. Choose foods with less than 5 percent of the Daily Value of sodium. Generally, foods with less than 300 mg of sodium per serving fit into this eating plan.  To find whole grains, look for the word "whole" as the first word in the ingredient list. Shopping  Buy products labeled as "low-sodium" or "no salt added."  Buy fresh foods. Avoid canned foods and premade or frozen meals. Cooking  Avoid adding salt when cooking. Use salt-free seasonings or herbs instead of table salt or sea salt. Check with your health care provider or pharmacist before using salt substitutes.  Do not fry foods. Cook foods using healthy methods such as baking, boiling, grilling, and broiling instead.  Cook with  heart-healthy oils, such as olive, canola, soybean, or sunflower oil. Meal planning   Eat a balanced diet that includes: ? 5 or more servings of fruits and vegetables each day. At each meal, try to fill half of your plate with fruits and vegetables. ? Up to 6-8 servings of whole grains each day. ? Less than 6 oz of lean meat, poultry, or fish each day. A 3-oz serving of meat is about the same size as a deck of cards. One egg equals 1 oz. ? 2 servings of low-fat dairy each day. ? A serving of nuts, seeds, or beans 5 times each week. ? Heart-healthy fats. Healthy fats called Omega-3 fatty acids are found in foods such as flaxseeds and coldwater fish, like sardines, salmon, and mackerel.  Limit how much you eat of the following: ? Canned or prepackaged foods. ? Food that is high in trans fat, such as fried foods. ? Food that is high in saturated fat, such as fatty meat. ? Sweets, desserts, sugary drinks, and other foods with added sugar. ? Full-fat dairy products.  Do not salt foods before eating.  Try to eat at least 2 vegetarian meals each week.  Eat more home-cooked food and less restaurant, buffet, and fast food.  When eating at a restaurant, ask that your food be prepared with less salt or no salt, if possible. What foods are recommended? The items listed may not be a complete list. Talk with your dietitian about what   dietary choices are best for you. Grains Whole-grain or whole-wheat bread. Whole-grain or whole-wheat pasta. Brown rice. Oatmeal. Quinoa. Bulgur. Whole-grain and low-sodium cereals. Pita bread. Low-fat, low-sodium crackers. Whole-wheat flour tortillas. Vegetables Fresh or frozen vegetables (raw, steamed, roasted, or grilled). Low-sodium or reduced-sodium tomato and vegetable juice. Low-sodium or reduced-sodium tomato sauce and tomato paste. Low-sodium or reduced-sodium canned vegetables. Fruits All fresh, dried, or frozen fruit. Canned fruit in natural juice (without  added sugar). Meat and other protein foods Skinless chicken or turkey. Ground chicken or turkey. Pork with fat trimmed off. Fish and seafood. Egg whites. Dried beans, peas, or lentils. Unsalted nuts, nut butters, and seeds. Unsalted canned beans. Lean cuts of beef with fat trimmed off. Low-sodium, lean deli meat. Dairy Low-fat (1%) or fat-free (skim) milk. Fat-free, low-fat, or reduced-fat cheeses. Nonfat, low-sodium ricotta or cottage cheese. Low-fat or nonfat yogurt. Low-fat, low-sodium cheese. Fats and oils Soft margarine without trans fats. Vegetable oil. Low-fat, reduced-fat, or light mayonnaise and salad dressings (reduced-sodium). Canola, safflower, olive, soybean, and sunflower oils. Avocado. Seasoning and other foods Herbs. Spices. Seasoning mixes without salt. Unsalted popcorn and pretzels. Fat-free sweets. What foods are not recommended? The items listed may not be a complete list. Talk with your dietitian about what dietary choices are best for you. Grains Baked goods made with fat, such as croissants, muffins, or some breads. Dry pasta or rice meal packs. Vegetables Creamed or fried vegetables. Vegetables in a cheese sauce. Regular canned vegetables (not low-sodium or reduced-sodium). Regular canned tomato sauce and paste (not low-sodium or reduced-sodium). Regular tomato and vegetable juice (not low-sodium or reduced-sodium). Pickles. Olives. Fruits Canned fruit in a light or heavy syrup. Fried fruit. Fruit in cream or butter sauce. Meat and other protein foods Fatty cuts of meat. Ribs. Fried meat. Bacon. Sausage. Bologna and other processed lunch meats. Salami. Fatback. Hotdogs. Bratwurst. Salted nuts and seeds. Canned beans with added salt. Canned or smoked fish. Whole eggs or egg yolks. Chicken or turkey with skin. Dairy Whole or 2% milk, cream, and half-and-half. Whole or full-fat cream cheese. Whole-fat or sweetened yogurt. Full-fat cheese. Nondairy creamers. Whipped toppings.  Processed cheese and cheese spreads. Fats and oils Butter. Stick margarine. Lard. Shortening. Ghee. Bacon fat. Tropical oils, such as coconut, palm kernel, or palm oil. Seasoning and other foods Salted popcorn and pretzels. Onion salt, garlic salt, seasoned salt, table salt, and sea salt. Worcestershire sauce. Tartar sauce. Barbecue sauce. Teriyaki sauce. Soy sauce, including reduced-sodium. Steak sauce. Canned and packaged gravies. Fish sauce. Oyster sauce. Cocktail sauce. Horseradish that you find on the shelf. Ketchup. Mustard. Meat flavorings and tenderizers. Bouillon cubes. Hot sauce and Tabasco sauce. Premade or packaged marinades. Premade or packaged taco seasonings. Relishes. Regular salad dressings. Where to find more information:  National Heart, Lung, and Blood Institute: www.nhlbi.nih.gov  American Heart Association: www.heart.org Summary  The DASH eating plan is a healthy eating plan that has been shown to reduce high blood pressure (hypertension). It may also reduce your risk for type 2 diabetes, heart disease, and stroke.  With the DASH eating plan, you should limit salt (sodium) intake to 2,300 mg a day. If you have hypertension, you may need to reduce your sodium intake to 1,500 mg a day.  When on the DASH eating plan, aim to eat more fresh fruits and vegetables, whole grains, lean proteins, low-fat dairy, and heart-healthy fats.  Work with your health care provider or diet and nutrition specialist (dietitian) to adjust your eating plan to your individual   calorie needs. This information is not intended to replace advice given to you by your health care provider. Make sure you discuss any questions you have with your health care provider. Document Released: 01/13/2011 Document Revised: 01/18/2016 Document Reviewed: 01/18/2016 Elsevier Interactive Patient Education  2017 Elsevier Inc.  

## 2016-12-02 NOTE — Progress Notes (Signed)
Subjective:    Patient ID: [name], male    DOB: 03/20/1950, 66 y.o.   MRN: [id number]  Hypertension  This is a chronic problem. Associated symptoms include shortness of breath. Pertinent negatives include no chest pain, headaches or neck pain. There are no compliance problems (takes meds every day, walks alot, eats healthy).   concerns about BP. Pt states lisinopril was stopped when he went in for surgery. Has been running high.  Patient does have COPD which is stable uses his inhaler does not smoke Patient also has aortic aneurysm being followed by vascular surgery on a regular basis will be checked again in December Recheck urine. Had UTI about 5 weeks ago. No symptoms now.  Results for orders placed or performed in visit on 12/02/16  POCT urinalysis dipstick  Result Value Ref Range   Color, UA     Clarity, UA     Glucose, UA     Bilirubin, UA     Ketones, UA     Spec Grav, UA <=1.005 (A) 1.010 - 1.025   Blood, UA     pH, UA 7.0 5.0 - 8.0   Protein, UA     Urobilinogen, UA  0.2 or 1.0 E.U./dL   Nitrite, UA     Leukocytes, UA  Negative  The patient comes in today for a wellness visit.  Patient did have major heart surgery earlier this summer along with a valve replacement is on a blood thinner is not had any bleeding issues  A review of their health history was completed.  A review of medications was also completed.  Any needed refills; he would like to have his medications updated  Eating habits: Eats relatively healthy  Falls/  MVA accidents in past few months: Has not had any falls or accident  Regular exercise: Doing light exercise  Specialist pt sees on regular basis: Does see cardiology and rehab  Preventative health issues were discussed.   Additional concerns: Up-to-date on colonoscopy  Flu vaccine today.    Review of Systems  Constitutional: Negative for activity change, appetite change and fever.  HENT: Negative for congestion and  rhinorrhea.   Eyes: Negative for discharge.  Respiratory: Positive for shortness of breath. Negative for cough and wheezing.   Cardiovascular: Negative for chest pain.  Gastrointestinal: Negative for abdominal pain, blood in stool and vomiting.  Genitourinary: Negative for difficulty urinating and frequency.  Musculoskeletal: Negative for neck pain.  Skin: Negative for rash.  Allergic/Immunologic: Negative for environmental allergies and food allergies.  Neurological: Negative for weakness and headaches.  Psychiatric/Behavioral: Negative for agitation.       Objective:   Physical Exam  Constitutional: He appears well-developed and well-nourished.  HENT:  Head: Normocephalic and atraumatic.  Right Ear: External ear normal.  Left Ear: External ear normal.  Nose: Nose normal.  Mouth/Throat: Oropharynx is clear and moist.  Eyes: Pupils are equal, round, and reactive to light. EOM are normal.  Neck: Normal range of motion. Neck supple. No thyromegaly present.  Cardiovascular: Normal rate, regular rhythm and normal heart sounds.   No murmur heard. Pulmonary/Chest: Effort normal and breath sounds normal. No respiratory distress. He has no wheezes.  Abdominal: Soft. Bowel sounds are normal. He exhibits no distension and no mass. There is no tenderness.  Genitourinary: Penis normal.  Musculoskeletal: Normal range of motion. He exhibits no edema.  Lymphadenopathy:    He has no cervical adenopathy.  Neurological: He is alert. He exhibits normal muscle tone.  Skin: Skin is warm and dry. No erythema.  Psychiatric: He has a normal mood and affect. His behavior is normal. Judgment normal.   Patient has shortness of breath associated with his COPD his heart overall is doing well blood pressure mildly elevated  Prostate exam normal     Assessment & Plan:  Adult wellness-complete.wellness physical was conducted today. Importance of diet and exercise were discussed in detail. In addition to  this a discussion regarding safety was also covered. We also reviewed over immunizations and gave recommendations regarding current immunization needed for age. In addition to this additional areas were also touched on including: Preventative health exams needed: Colonoscopy up-to-date on colonoscopy  Patient was advised yearly wellness exam  HTN subpar control add lisinopril 10 mg daily.  Follow blood pressure closely.  Patient will send [place] some readings.  Adjust accordingly.  Patient was on 20/12.5 before his heart surgery  PSA is gone up mildly I believe it is wise to recheck this again within 3 months time to make sure it is not going up accordingly  Recent UTI urinalysis negative we will send it for culture  Hyperlipidemia LDL we would like to see improved therefore atorvastatin was increased to 80 mg

## 2016-12-02 NOTE — Progress Notes (Signed)
Labs ordered and mailed to the pt with a note asking him to have labs drawn end of January 2019.

## 2016-12-02 NOTE — Addendum Note (Signed)
Addended by: Meredith LeedsSUTTON, CRYSTAL L on: 12/02/2016 04:19 PM   Modules accepted: Orders

## 2016-12-05 ENCOUNTER — Other Ambulatory Visit: Payer: Self-pay | Admitting: *Deleted

## 2016-12-05 ENCOUNTER — Encounter (HOSPITAL_COMMUNITY): Payer: BLUE CROSS/BLUE SHIELD

## 2016-12-05 LAB — URINE CULTURE

## 2016-12-05 LAB — SPECIMEN STATUS REPORT

## 2016-12-05 MED ORDER — LEVOFLOXACIN 500 MG PO TABS: 500.0000 mg | ORAL_TABLET | Freq: Every day | ORAL | 0 refills | Status: DC

## 2016-12-21 ENCOUNTER — Other Ambulatory Visit: Payer: Self-pay | Admitting: Family Medicine

## 2016-12-21 ENCOUNTER — Telehealth: Payer: Self-pay | Admitting: *Deleted

## 2016-12-21 ENCOUNTER — Other Ambulatory Visit: Payer: Self-pay | Admitting: *Deleted

## 2016-12-21 DIAGNOSIS — N390 Urinary tract infection, site not specified: Secondary | ICD-10-CM

## 2016-12-21 LAB — POCT URINALYSIS DIPSTICK
Spec Grav, UA: <=1.005 — AB (ref 1.010–1.025)
pH, UA: 7 (ref 5.0–8.0)

## 2016-12-21 NOTE — Telephone Encounter (Signed)
Urine negative, await culture

## 2016-12-21 NOTE — Telephone Encounter (Signed)
Pt came in to drop off urine per dr Lorin Picketscott. Pt finished levaquin. He is not any any urinary symptoms. Dipstick is clear. Urine is spun for dr Lorin Picketscott to look at. And also urine has been sent for urine culture per dr scott's note.

## 2016-12-23 ENCOUNTER — Other Ambulatory Visit: Payer: Self-pay | Admitting: Family Medicine

## 2016-12-23 ENCOUNTER — Encounter: Payer: Self-pay | Admitting: Family Medicine

## 2016-12-23 MED ORDER — DILTIAZEM HCL ER COATED BEADS 120 MG PO CP24: 120.0000 mg | ORAL_CAPSULE | Freq: Every day | ORAL | 6 refills | Status: DC

## 2016-12-25 LAB — SPECIMEN STATUS REPORT

## 2016-12-25 LAB — URINE CULTURE

## 2016-12-27 MED ORDER — NITROFURANTOIN MONOHYD MACRO 100 MG PO CAPS: 100.0000 mg | ORAL_CAPSULE | Freq: Two times a day (BID) | ORAL | 0 refills | Status: DC

## 2016-12-27 NOTE — Addendum Note (Signed)
Addended by: Meredith LeedsSUTTON, CRYSTAL L on: 12/27/2016 05:32 PM   Modules accepted: Orders

## 2017-01-02 ENCOUNTER — Other Ambulatory Visit: Payer: Self-pay | Admitting: *Deleted

## 2017-01-02 MED ORDER — AMIODARONE HCL 200 MG PO TABS: 200.0000 mg | ORAL_TABLET | Freq: Every day | ORAL | 3 refills | Status: DC

## 2017-01-18 ENCOUNTER — Other Ambulatory Visit: Payer: Self-pay | Admitting: Family Medicine

## 2017-01-23 ENCOUNTER — Other Ambulatory Visit: Payer: Self-pay | Admitting: *Deleted

## 2017-01-23 DIAGNOSIS — R300 Dysuria: Secondary | ICD-10-CM

## 2017-01-25 ENCOUNTER — Encounter: Payer: Self-pay | Admitting: Family Medicine

## 2017-01-25 LAB — URINE CULTURE

## 2017-01-26 MED ORDER — NITROFURANTOIN MONOHYD MACRO 100 MG PO CAPS: 100.0000 mg | ORAL_CAPSULE | Freq: Two times a day (BID) | ORAL | 0 refills | Status: DC

## 2017-01-26 NOTE — Addendum Note (Signed)
Addended by: Meredith LeedsSUTTON, CRYSTAL L on: 01/26/2017 10:46 AM   Modules accepted: Orders

## 2017-02-01 ENCOUNTER — Encounter: Payer: Self-pay | Admitting: Cardiovascular Disease

## 2017-02-01 ENCOUNTER — Ambulatory Visit (INDEPENDENT_AMBULATORY_CARE_PROVIDER_SITE_OTHER): Payer: BLUE CROSS/BLUE SHIELD | Admitting: Cardiovascular Disease

## 2017-02-01 VITALS — BP 150/73 | HR 80 | Ht 69.0 in | Wt 196.2 lb

## 2017-02-01 DIAGNOSIS — Z951 Presence of aortocoronary bypass graft: Secondary | ICD-10-CM | POA: Diagnosis not present

## 2017-02-01 DIAGNOSIS — Z952 Presence of prosthetic heart valve: Secondary | ICD-10-CM

## 2017-02-01 DIAGNOSIS — E782 Mixed hyperlipidemia: Secondary | ICD-10-CM

## 2017-02-01 DIAGNOSIS — I714 Abdominal aortic aneurysm, without rupture, unspecified: Secondary | ICD-10-CM

## 2017-02-01 DIAGNOSIS — I10 Essential (primary) hypertension: Secondary | ICD-10-CM | POA: Diagnosis not present

## 2017-02-01 DIAGNOSIS — I252 Old myocardial infarction: Secondary | ICD-10-CM | POA: Diagnosis not present

## 2017-02-01 DIAGNOSIS — I480 Paroxysmal atrial fibrillation: Secondary | ICD-10-CM | POA: Diagnosis not present

## 2017-02-01 DIAGNOSIS — I25708 Atherosclerosis of coronary artery bypass graft(s), unspecified, with other forms of angina pectoris: Secondary | ICD-10-CM | POA: Diagnosis not present

## 2017-02-01 MED ORDER — LISINOPRIL 20 MG PO TABS: 20.0000 mg | ORAL_TABLET | Freq: Every day | ORAL | 3 refills | Status: DC

## 2017-02-01 NOTE — Progress Notes (Signed)
SUBJECTIVE: The patient presents for routine follow-up. He underwent 2 vessel CABG with LIMA to the LAD and SVG to the circumflex on 09/06/16. He also went aortic valve replacement with a 23 mm Magna Ease pericardial tissue valve. He developed postoperative atrial fibrillation and flutter going out to postoperative day 12 and was started on amiodarone.  Echocardiogram 09/14/16: Vigorous left ventricular systolic function, LVEF 65-70%, normal regional wall motion, normally functioning 23 mm Magna Ease pericardial bioprosthesis. The left atrium and right atrium and right ventricle were mildly dilated.  He is doing well and denies chest pain, shortness of breath, leg swelling, and palpitations.  He has been checking his blood pressure at home with systolic readings consistently in the 140 range.  He denies bleeding problems.  He is concerned about the cost of Eliquis.    Review of Systems: As per "subjective", otherwise negative.  Allergies  Allergen Reactions  . Ciprofloxacin     Artificial heart valve    Current Outpatient Medications  Medication Sig Dispense Refill  . acetaminophen (TYLENOL) 500 MG tablet Take 500 mg by mouth every 6 (six) hours as needed.    [name]. aspirin 81 MG tablet Take 81 mg by mouth at bedtime.     [name]. atorvastatin (LIPITOR) 80 MG tablet Take 1 tablet (80 mg total) by mouth daily. 30 tablet 5  . diltiazem (CARDIZEM CD) 120 MG 24 hr capsule Take 1 capsule (120 mg total) daily by mouth. 30 capsule 6  . ELIQUIS 5 MG TABS tablet TAKE ONE TABLET BY MOUTH TWICE DAILY 60 tablet 3  . ferrous sulfate 325 (65 FE) MG tablet Take 325 mg by mouth daily with breakfast.    . fexofenadine (ALLEGRA) 180 MG tablet Take 180 mg by mouth daily.    . Fluticasone-Umeclidin-Vilant ([name] ELLIPTA) 100-62.5-25 MCG/INH AEPB Inhale 1 puff into the lungs daily.    [name]. levalbuterol (XOPENEX) 0.63 MG/3ML nebulizer solution Take 3 mLs (0.63 mg total) by nebulization every 3 (three) hours as  needed for wheezing or shortness of breath. (Patient taking differently: Take 0.63 mg by nebulization daily. ) 90 mL 1  . levofloxacin (LEVAQUIN) 500 MG tablet Take 1 tablet (500 mg total) by mouth daily. 7 tablet 0  . lisinopril (PRINIVIL,ZESTRIL) 20 MG tablet Take 1 tablet (20 mg total) by mouth daily. 90 tablet 3  . mometasone (NASONEX) 50 MCG/ACT nasal spray Place 2 sprays into the nose daily as needed (for allergies). (Patient taking differently: Place 1 spray into the nose daily as needed (for allergies). ) 17 g 5  . Multiple Vitamin (MULTIVITAMIN) tablet Take 1 tablet by mouth daily.    . nitrofurantoin, macrocrystal-monohydrate, (MACROBID) 100 MG capsule Take 1 capsule (100 mg total) by mouth 2 (two) times daily. 14 capsule 0  . tetrahydrozoline 0.05 % ophthalmic solution Place 2 drops into both eyes daily as needed (dry, red eye).    . VENTOLIN HFA 108 (90 Base) MCG/ACT inhaler INHALE 2 PUFFS BY MOUTH EVERY 4 HOURS AS NEEDED 18 g 2  . vitamin C (ASCORBIC ACID) 500 MG tablet Take 500 mg by mouth daily. Reported on 07/16/2015     No current facility-administered medications for this visit.     Past Medical History:  Diagnosis Date  . AAA (abdominal aortic aneurysm) (HCC)   . COPD (chronic obstructive pulmonary disease) (HCC)   . Fatty liver   . Hyperlipidemia   . Hypertension   . Severe aortic stenosis 08/29/2016  . White  coat hypertension     Past Surgical History:  Procedure Laterality Date  . ANKLE SURGERY Right 2009  . AORTIC VALVE REPLACEMENT N/A 09/05/2016   Procedure: AORTIC VALVE REPLACEMENT (AVR) USING 23 MM MAGNA EASE PERICARDIAL TISSUE VALVE;  Surgeon: Delight OvensGerhardt, Edward B, MD;  [facility];  Service: Open Heart Surgery;  Laterality: N/A;  . APPENDECTOMY    . CARDIAC CATHETERIZATION N/A 02/02/2016   Procedure: Left Heart Cath and Coronary Angiography;  Surgeon: Rinaldo CloudMohan Harwani, MD;  [facility];  Service: Cardiovascular;  Laterality: N/A;  . COLONOSCOPY   06/22/2010   WUJ:[id number]:Diminutive rectal and right colon polyps removed remainder rectum and colon appared normal  . COLONOSCOPY WITH PROPOFOL N/A 05/15/2014   Procedure: COLONOSCOPY WITH PROPOFOL;  Surgeon: Corbin Adeobert M Rourk, MD;  Location: [facility] ORS;  Service: Endoscopy;  Laterality: N/A;  In cecum @ [id number], out @ 0937, withdrawal time 19 minutes  . CORONARY ARTERY BYPASS GRAFT N/A 09/05/2016   Procedure: CORONARY ARTERY BYPASS GRAFTING (CABG) USING LIMA TO DISTAL LAD AND ENDOSCOPICALLY HARVESTED GREATER SAPHENOUS VEIN TO CIRC.;  Surgeon: Delight OvensGerhardt, Edward B, MD;  Location: [facility] OR;  Service: Open Heart Surgery;  Laterality: N/A;  . ENDOVEIN HARVEST OF GREATER SAPHENOUS VEIN Right 09/05/2016   Procedure: ENDOVEIN HARVEST OF GREATER SAPHENOUS VEIN;  Surgeon: Delight OvensGerhardt, Edward B, MD;  [facility];  Service: Open Heart Surgery;  Laterality: Right;  . POLYPECTOMY N/A 05/15/2014   Procedure: POLYPECTOMY;  Surgeon: Corbin Adeobert M Rourk, MD;  Location: [facility];  Service: Endoscopy;  Laterality: N/A;  . RIGHT/LEFT HEART CATH AND CORONARY ANGIOGRAPHY N/A 08/29/2016   Procedure: Right/Left Heart Cath and Coronary Angiography;  Surgeon: Tonny Bollmanooper, Michael, MD;  [facility];  Service: Cardiovascular;  Laterality: N/A;  . TEE WITHOUT CARDIOVERSION N/A 09/05/2016   Procedure: TRANSESOPHAGEAL ECHOCARDIOGRAM (TEE);  Surgeon: Delight OvensGerhardt, Edward B, MD;  [facility];  Service: Open Heart Surgery;  Laterality: N/A;    Social History   Socioeconomic History  . Marital status: Married    Spouse name: Not on file  . Number of children: Not on file  . Years of education: Not on file  . Highest education level: Not on file  Social Needs  . Financial resource strain: Not on file  . Food insecurity - worry: Not on file  . Food insecurity - inability: Not on file  . Transportation needs - medical: Not on file  . Transportation needs - non-medical: Not on file  Occupational History  . Not on file  Tobacco Use  . Smoking  status: Former Smoker    Packs/day: 1.50    Years: 40.00    Pack years: 60.00    Types: Cigarettes    Last attempt to quit: 04/16/2002    Years since quitting: 14.8  . Smokeless tobacco: Never Used  Substance and Sexual Activity  . Alcohol use: Yes    Alcohol/week: 3.0 oz    Types: 5 Cans of beer per week    Comment: per pt  . Drug use: No  . Sexual activity: No    Birth control/protection: None  Other Topics Concern  . Not on file  Social History Narrative   [facility] (09/13/16):   Previously worked in a [employer]. Also worked Production designer, theatre/television/filmmanufacturing ostomy bags. Currently has an outdoor cat and no indoor pets. No bird or mold exposure.     Vitals:   02/01/17 0815  BP: (!) 150/73  Pulse: 80  SpO2: 90%  Weight: 196 lb 3.2 oz (89 kg)  Height: 5\' 9"  (1.753 m)    Wt Readings from Last 3 Encounters:  02/01/17 196 lb 3.2 oz (89 kg)  12/02/16 187 lb (84.8 kg)  10/24/16 186 lb 3.2 oz (84.5 kg)     PHYSICAL EXAM General: NAD HEENT: Normal. Neck: No JVD, no thyromegaly. Lungs: Clear to auscultation bilaterally with normal respiratory effort. CV: Regular rate and rhythm, normal S1/prosthetic S2, no S3/S4, no murmur. No pretibial or periankle edema.  No carotid bruit.   Abdomen: Soft, nontender, no distention.  Neurologic: Alert and oriented.  Psych: Normal affect. Skin: Normal. Musculoskeletal: No gross deformities.    ECG: Most recent ECG reviewed.   Labs: Lab Results  Component Value Date/Time   K 4.6 11/14/2016 08:20 AM   BUN 9 11/14/2016 08:20 AM   BUN 9 05/18/2015 08:34 AM   CREATININE 0.65 (L) 11/14/2016 08:20 AM   ALT 18 11/14/2016 08:20 AM   TSH 1.701 09/12/2016 01:19 PM   HGB 15.3 11/14/2016 08:20 AM   HGB 15.8 06/13/2015 09:06 AM     Lipids: Lab Results  Component Value Date/Time   LDLCALC 56 05/16/2016 08:56 AM   LDLCALC 70 05/18/2015 08:34 AM   CHOL 153 11/14/2016 08:20 AM   CHOL 128 05/18/2015 08:34 AM   TRIG 73 11/14/2016 08:20 AM   HDL 57  11/14/2016 08:20 AM   HDL 43 05/18/2015 08:34 AM       ASSESSMENT AND PLAN:  1. Coronary artery disease status post two-vessel CABG: Symptomatically stable. Continue aspirin and statin.   2. Status post aortic valve replacement with a bioprosthetic valve: Stable. Normally functioning by most recent echo detailed above.  3. Paroxysmal atrial fibrillation and flutter: Symptomatically stable and without palpitations.  I will discontinue amiodarone.  He did have postoperative atrial fibrillation out to day 12, and due to his concomitant lung disease I am concerned about the possibility of recurrence.  For this reason, I recommend continued anticoagulation with apixaban 5 mg twice daily. Continue long-acting Cardizem. 4. Hypertension: Controlled. No changes.  5. Hyperlipidemia: Continue statin. LDL 81 on 11/14/16.  6. Abdominal aortic aneurysm: Abdominal aortic ultrasound on 07/28/16 demonstrated AAA 4.7 x 4.7 cm in diameter. Follow-up ultrasound on February 02, 2017.  7.  Hypertension: Blood pressure is elevated today and remains elevated at home.  I will increase lisinopril to 20 mg daily.     Disposition: Follow up 6 months.  Prentice Docker, M.D., F.A.C.C.

## 2017-02-01 NOTE — Patient Instructions (Signed)
Medication Instructions:   Stop Amiodarone.  Increase Lisinopril to 20mg  daily.  Continue all other medications.    Labwork: none  Testing/Procedures: none  Follow-Up: Your physician wants you to follow up in: 6 months.  You will receive a reminder letter in the mail one-two months in advance.  If you don't receive a letter, please call our office to schedule the follow up appointment   Any Other Special Instructions Will Be Listed Below (If Applicable).  If you need a refill on your cardiac medications before your next appointment, please call your pharmacy.

## 2017-02-02 ENCOUNTER — Ambulatory Visit (HOSPITAL_COMMUNITY)
Admission: RE | Admit: 2017-02-02 | Discharge: 2017-02-02 | Disposition: A | Discharge status: Home / Self Care | Payer: BLUE CROSS/BLUE SHIELD | Source: Ambulatory Visit | Attending: Vascular Surgery | Admitting: Vascular Surgery

## 2017-02-02 ENCOUNTER — Ambulatory Visit: Payer: BLUE CROSS/BLUE SHIELD | Admitting: Family

## 2017-02-02 DIAGNOSIS — I714 Abdominal aortic aneurysm, without rupture, unspecified: Secondary | ICD-10-CM

## 2017-02-03 ENCOUNTER — Ambulatory Visit (INDEPENDENT_AMBULATORY_CARE_PROVIDER_SITE_OTHER): Payer: BLUE CROSS/BLUE SHIELD | Admitting: Family Medicine

## 2017-02-03 ENCOUNTER — Encounter: Payer: Self-pay | Admitting: Family Medicine

## 2017-02-03 VITALS — BP 128/86 | Temp 98.7°F | Ht 69.0 in | Wt 196.2 lb

## 2017-02-03 DIAGNOSIS — J019 Acute sinusitis, unspecified: Secondary | ICD-10-CM | POA: Diagnosis not present

## 2017-02-03 MED ORDER — HYDROCODONE-HOMATROPINE 5-1.5 MG/5ML PO SYRP: 5.0000 mL | ORAL_SOLUTION | Freq: Four times a day (QID) | ORAL | 0 refills | Status: DC | PRN

## 2017-02-03 MED ORDER — AMOXICILLIN-POT CLAVULANATE 875-125 MG PO TABS: 1.0000 | ORAL_TABLET | Freq: Two times a day (BID) | ORAL | 0 refills | Status: DC

## 2017-02-03 MED ORDER — PREDNISONE 20 MG PO TABS: ORAL_TABLET | ORAL | 0 refills | Status: DC

## 2017-02-03 NOTE — Progress Notes (Signed)
   Subjective:    Patient ID: [name], male    DOB: Jun 01, 1950, 66 y.o.   MRN: [id number]  Cough  This is a new problem. The current episode started 1 to 4 weeks ago. Associated symptoms include headaches, nasal congestion, rhinorrhea, a sore throat and wheezing. Pertinent negatives include no chest pain, ear pain or fever. Treatments tried: otc cold meds.   Viral illness for the past 2-3 weeks we can the system opening up the door for a sinus infection some wheezing at nighttime no difficulty breathing   Review of Systems  Constitutional: Negative for activity change and fever.  HENT: Positive for congestion, rhinorrhea and sore throat. Negative for ear pain.   Eyes: Negative for discharge.  Respiratory: Positive for cough and wheezing.   Cardiovascular: Negative for chest pain.  Neurological: Positive for headaches.       Objective:   Physical Exam  Constitutional: He appears well-developed.  HENT:  Head: Normocephalic.  Mouth/Throat: Oropharynx is clear and moist. No oropharyngeal exudate.  Neck: Normal range of motion.  Cardiovascular: Normal rate, regular rhythm and normal heart sounds.  No murmur heard. Pulmonary/Chest: Effort normal and breath sounds normal. He has no wheezes.  Lymphadenopathy:    He has no cervical adenopathy.  Neurological: He exhibits normal muscle tone.  Skin: Skin is warm and dry.  Nursing note and vitals reviewed.   Moderate sinus tenderness      Assessment & Plan:  Patient was seen today for upper respiratory illness. It is felt that the patient is dealing with sinusitis. Antibiotics were prescribed today. Importance of compliance with medication was discussed. Symptoms should gradually resolve over the course of the next several days. If high fevers, progressive illness, difficulty breathing, worsening condition or failure for symptoms to improve over the next several days then the patient is to follow-up. If any emergent conditions  the patient is to follow-up in the emergency department otherwise to follow-up in the office.  Patient has underlying COPD Hycodan as needed for cough in the evening time Printed prescription for prednisone to fill if symptoms of COPD become progressive warning signs discussed x-rays lab work not indicated currently

## 2017-02-10 ENCOUNTER — Ambulatory Visit (INDEPENDENT_AMBULATORY_CARE_PROVIDER_SITE_OTHER): Payer: Medicare Other | Admitting: Family

## 2017-02-10 ENCOUNTER — Other Ambulatory Visit: Payer: Self-pay

## 2017-02-10 ENCOUNTER — Encounter: Payer: Self-pay | Admitting: Family

## 2017-02-10 VITALS — BP 140/81 | HR 71 | Temp 97.7°F | Resp 18 | Ht 69.0 in | Wt 194.0 lb

## 2017-02-10 DIAGNOSIS — I714 Abdominal aortic aneurysm, without rupture, unspecified: Secondary | ICD-10-CM

## 2017-02-10 DIAGNOSIS — Z87891 Personal history of nicotine dependence: Secondary | ICD-10-CM | POA: Diagnosis not present

## 2017-02-10 NOTE — Patient Instructions (Signed)
Abdominal Aortic Aneurysm Blood pumps away from the heart through tubes (blood vessels) called arteries. Aneurysms are weak or damaged places in the wall of an artery. It bulges out like a balloon. An abdominal aortic aneurysm happens in the main artery of the body (aorta). It can burst or tear, causing bleeding inside the body. This is an emergency. It needs treatment right away. What are the causes? The exact cause is unknown. Things that could cause this problem include:  Fat and other substances building up in the lining of a tube.  Swelling of the walls of a blood vessel.  Certain tissue diseases.  Belly (abdominal) trauma.  An infection in the main artery of the body.  What increases the risk? There are things that make it more likely for you to have an aneurysm. These include:  Being over the age of 65 years old.  Having high blood pressure (hypertension).  Being a male.  Being white.  Being very overweight (obese).  Having a family history of aneurysm.  Using tobacco products.  What are the signs or symptoms? Symptoms depend on the size of the aneurysm and how fast it grows. There may not be symptoms. If symptoms occur, they can include:  Pain (belly, side, lower back, or groin).  Feeling full after eating a small amount of food.  Feeling sick to your stomach (nauseous), throwing up (vomiting), or both.  Feeling a lump in your belly that feels like it is beating (pulsating).  Feeling like you will pass out (faint).  How is this treated?  Medicine to control blood pressure and pain.  Imaging tests to see if the aneurysm gets bigger.  Surgery. How is this prevented? To lessen your chance of getting this condition:  Stop smoking. Stop chewing tobacco.  Limit or avoid alcohol.  Keep your blood pressure, blood sugar, and cholesterol within normal limits.  Eat less salt.  Eat foods low in saturated fats and cholesterol. These are found in animal and  whole dairy products.  Eat more fiber. Fiber is found in whole grains, vegetables, and fruits.  Keep a healthy weight.  Stay active and exercise often.  This information is not intended to replace advice given to you by your health care provider. Make sure you discuss any questions you have with your health care provider. Document Released: 05/21/2012 Document Revised: 07/02/2015 Document Reviewed: 02/24/2012 Elsevier Interactive Patient Education  2017 [employer]  

## 2017-02-10 NOTE — Progress Notes (Signed)
Vitals:   02/10/17 1007  BP: (!) 150/87  Pulse: 74  Resp: 18  Temp: 97.7 F (36.5 C)  TempSrc: Oral  SpO2: 93%  Weight: 194 lb (88 kg)  Height: 5\' 9"  (1.753 m)

## 2017-02-10 NOTE — Progress Notes (Signed)
[facility] [place]   CC: Follow up Abdominal Aortic Aneurysm  History of Present Illness  [name] is a 67 y.o. (Oct 30, 1950) male patient of Dr. Darrick Penna who presents for evaluation of abdominal aortic aneurysm.The patient has been asymptomatic. His aneurysm was discovered on a routine ultrasound for evaluation of elevated liver function tests. His family history is remarkable for his brother who had an abdominal aortic aneurysm repaired at age 52. Other medical problems include COPD, hyperlipidemia, hypertension all of which are currently stable. He is a former smoker and quit 2004. He does have COPD but does not require home oxygen and can climb more than 2 flights of stairs without becoming short of breath.  He returns today for follow-up. His aneurysm was 4.7 cm in diameter on previous CT scan on 12-5-16while ultrasound at that time showed it to be 5.3 cm.  He denies any history of stroke, denies claudication sx's with walking.  He had an MI in December 2017, had a diagnostic cardiac cath, found severe aortic valve stenosis; seeing Dr. Darl Householder.  He had an aortic valve replaced and CABG in July 2018. His cardiologist is adjusting his htn medications to lower his blood pressure.    Pt Diabetic: No Pt smoker: former smoker, quit in 2014, smoked for 15 years    Past Medical History:  Diagnosis Date  . AAA (abdominal aortic aneurysm) (HCC)   . COPD (chronic obstructive pulmonary disease) (HCC)   . Fatty liver   . Hyperlipidemia   . Hypertension   . Severe aortic stenosis 08/29/2016  . White coat hypertension    Past Surgical History:  Procedure Laterality Date  . ANKLE SURGERY Right 2009  . AORTIC VALVE REPLACEMENT N/A 09/05/2016   Procedure: AORTIC VALVE REPLACEMENT (AVR) USING 23 MM MAGNA EASE PERICARDIAL TISSUE VALVE;  Surgeon: Delight Ovens, MD;  Location: [facility] OR;  Service: Open Heart Surgery;  Laterality: N/A;  . APPENDECTOMY    .  CARDIAC CATHETERIZATION N/A 02/02/2016   Procedure: Left Heart Cath and Coronary Angiography;  Surgeon: Rinaldo Cloud, MD;  Location: [facility] INVASIVE CV LAB;  Service: Cardiovascular;  Laterality: N/A;  . COLONOSCOPY  06/22/2010   RUE:[id number] rectal and right colon polyps removed remainder rectum and colon appared normal  . COLONOSCOPY WITH PROPOFOL N/A 05/15/2014   Procedure: COLONOSCOPY WITH PROPOFOL;  Surgeon: Corbin Ade, MD;  Location: [facility] ORS;  Service: Endoscopy;  Laterality: N/A;  In cecum @ [id number], out @ 0937, withdrawal time 19 minutes  . CORONARY ARTERY BYPASS GRAFT N/A 09/05/2016   Procedure: CORONARY ARTERY BYPASS GRAFTING (CABG) USING LIMA TO DISTAL LAD AND ENDOSCOPICALLY HARVESTED GREATER SAPHENOUS VEIN TO CIRC.;  Surgeon: Delight Ovens, MD;  Location: [facility] OR;  Service: Open Heart Surgery;  Laterality: N/A;  . ENDOVEIN HARVEST OF GREATER SAPHENOUS VEIN Right 09/05/2016   Procedure: ENDOVEIN HARVEST OF GREATER SAPHENOUS VEIN;  Surgeon: Delight Ovens, MD;  Location: [facility] OR;  Service: Open Heart Surgery;  Laterality: Right;  . POLYPECTOMY N/A 05/15/2014   Procedure: POLYPECTOMY;  Surgeon: Corbin Ade, MD;  Location: [facility] ORS;  Service: Endoscopy;  Laterality: N/A;  . RIGHT/LEFT HEART CATH AND CORONARY ANGIOGRAPHY N/A 08/29/2016   Procedure: Right/Left Heart Cath and Coronary Angiography;  Surgeon: Tonny Bollman, MD;  Location: [facility] INVASIVE CV LAB;  Service: Cardiovascular;  Laterality: N/A;  . TEE WITHOUT CARDIOVERSION N/A 09/05/2016   Procedure: TRANSESOPHAGEAL ECHOCARDIOGRAM (TEE);  Surgeon: Delight Ovens, MD;  Location: [facility] OR;  Service: Open Heart Surgery;  Laterality: N/A;   Social History Social History   Socioeconomic History  . Marital status: Married    Spouse name: Not on file  . Number of children: Not on file  . Years of education: Not on file  . Highest education level: Not on file  Social Needs  . Financial resource strain: Not on file  . Food insecurity -  worry: Not on file  . Food insecurity - inability: Not on file  . Transportation needs - medical: Not on file  . Transportation needs - non-medical: Not on file  Occupational History  . Not on file  Tobacco Use  . Smoking status: Former Smoker    Packs/day: 1.50    Years: 40.00    Pack years: 60.00    Types: Cigarettes    Last attempt to quit: 04/16/2002    Years since quitting: 14.8  . Smokeless tobacco: Never Used  Substance and Sexual Activity  . Alcohol use: Yes    Alcohol/week: 3.0 oz    Types: 5 Cans of beer per week    Comment: per pt  . Drug use: No  . Sexual activity: No    Birth control/protection: None  Other Topics Concern  . Not on file  Social History Narrative   [facility] (09/13/16):   Previously worked in a Tax inspector. Also worked Production designer, theatre/television/film. Currently has an outdoor cat and no indoor pets. No bird or mold exposure.   Family History Family History  Problem Relation Age of Onset  . COPD Mother   . Hypertension Sister   . AAA (abdominal aortic aneurysm) Brother   . Colon cancer Neg Hx   . Liver disease Neg Hx     Current Outpatient Medications on File Prior to Visit  Medication Sig Dispense Refill  . acetaminophen (TYLENOL) 500 MG tablet Take 500 mg by mouth every 6 (six) hours as needed.    [name] amoxicillin-clavulanate (AUGMENTIN) 875-125 MG tablet Take 1 tablet by mouth 2 (two) times daily. 28 tablet 0  . aspirin 81 MG tablet Take 81 mg by mouth at bedtime.     [name] atorvastatin (LIPITOR) 80 MG tablet Take 1 tablet (80 mg total) by mouth daily. 30 tablet 5  . diltiazem (CARDIZEM CD) 120 MG 24 hr capsule Take 1 capsule (120 mg total) daily by mouth. 30 capsule 6  . ELIQUIS 5 MG TABS tablet TAKE ONE TABLET BY MOUTH TWICE DAILY 60 tablet 3  . ferrous sulfate 325 (65 FE) MG tablet Take 325 mg by mouth daily with breakfast.    . fexofenadine (ALLEGRA) 180 MG tablet Take 180 mg by mouth daily.    . Fluticasone-Umeclidin-Vilant ([name] ELLIPTA)  100-62.5-25 MCG/INH AEPB Inhale 1 puff into the lungs daily.    [name] HYDROcodone-homatropine (HYCODAN) 5-1.5 MG/5ML syrup Take 5 mLs by mouth every 6 (six) hours as needed for cough. 120 mL 0  . levalbuterol (XOPENEX) 0.63 MG/3ML nebulizer solution Take 3 mLs (0.63 mg total) by nebulization every 3 (three) hours as needed for wheezing or shortness of breath. (Patient taking differently: Take 0.63 mg by nebulization daily. ) 90 mL 1  . lisinopril (PRINIVIL,ZESTRIL) 20 MG tablet Take 1 tablet (20 mg total) by mouth daily. 90 tablet 3  . mometasone (NASONEX) 50 MCG/ACT nasal spray Place 2 sprays into the nose daily as needed (for allergies). (Patient taking differently: Place 1 spray into the nose daily as needed (for allergies). ) 17 g 5  .  Multiple Vitamin (MULTIVITAMIN) tablet Take 1 tablet by mouth daily.    . predniSONE (DELTASONE) 20 MG tablet 3qd for 3d then 2qd for 3d then 1qd for 3d 18 tablet 0  . tetrahydrozoline 0.05 % ophthalmic solution Place 2 drops into both eyes daily as needed (dry, red eye).    . VENTOLIN HFA 108 (90 Base) MCG/ACT inhaler INHALE 2 PUFFS BY MOUTH EVERY 4 HOURS AS NEEDED 18 g 2  . vitamin C (ASCORBIC ACID) 500 MG tablet Take 500 mg by mouth daily. Reported on 07/16/2015     No current facility-administered medications on file prior to visit.    Allergies  Allergen Reactions  . Ciprofloxacin     Artificial heart valve    ROS: See HPI for pertinent positives and negatives.  Physical Examination  Vitals:   02/10/17 1007 02/10/17 1012  BP: (!) 150/87 140/81  Pulse: 74 71  Resp: 18   Temp: 97.7 F (36.5 C)   TempSrc: Oral   SpO2: 93%   Weight: 194 lb (88 kg)   Height: 5\' 9"  (1.753 m)    Body mass index is 28.65 kg/m.  General: A&O x 3, WD, male in NAD.  HEENT: No gross abnormalities.  Eyes: PERRLA  Pulmonary: Sym exp, respirations are non labored, limited air movement in all fields, CTAB, no rales, rhonchi, or wheezing.  Cardiac: Regular rate and  rhythm, muffled heart sounds   Carotid Bruits Right Left   Negative Negative   Abdominal aortic pulse is not palpable Radial pulses are 2+ palpable  VASCULAR EXAM:  LE Pulses Right Left  FEMORAL 1+palpable 1+palpable   POPLITEAL notpalpable  notpalpable  POSTERIOR TIBIAL notpalpable  notpalpable   DORSALIS PEDIS ANTERIOR TIBIAL faintlypalpable  faintlypalpable      Gastrointestinal: soft, NTND, -G/R, - HSM, - masses palpated, - CVAT B.  Musculoskeletal: M/S 5/5 throughout, Extremities without ischemic changes.  Skin: No rash, no cellulitis, no ulcers noted.   Neurologic: CN 2-12 intact, Pain and light touch intact in extremities are intact, Motor exam as listed above      DATA  AAA Duplex (02-02-17):  Previous size: 4.7 cm (Date: 07/28/16); Right CIA: 1.21 cm; Left CIA: not visualized.  Limited exam due to overlying bowel gas   Current size:  4.6 cm at distal abdominal aorta (Date: 02-02-17), proximal aorta not visualized. Right CIA: 1.9 cm; Left CIA: not visualized.     01-12-15 CTA abd/pelvis: Vascular Findings:  Abdominal aorta: There is a moderate to large amount of eccentric mixed calcified and noncalcified atherosclerotic plaque throughout the abdominal aorta, not resulting in a hemodynamically significant stenosis. There is a moderate to large sized infrarenal abdominal aortic aneurysm which measures approximately 4.7 x 4.7 x 4.6 cm as measured in greatest oblique short axis axial (image 178, series 4), sagittal (image 88, series 604) and coronal (image 90, series 602) dimensions respectively. The aneurysm the dominant component of the aneurysm originates approximately 6.2 cm caudal to the take-off of the most inferior left renal artery and extends to the level of the aortic bifurcation though does not extend into either common carotid artery. No periaortic  stranding. Celiac artery: There is a minimal amount of eccentric mixed calcified and noncalcified atherosclerotic plaque involving the cranial aspect of the origin the celiac artery, not resulting in hemodynamically significant stenosis. Conventional branching pattern. SMA: There is a minimal amount of eccentric mixed calcified and noncalcified atherosclerotic plaque involving the cranial aspect of the origin the SMA, not resulting in  hemodynamically significant stenosis. Conventional branching pattern. The distal tributaries the SMA are widely patent without discrete intraluminal filling defect to suggest distal embolism. Right Renal artery: Solitary; there is a minimal amount of eccentric mixed calcified and noncalcified atherosclerotic plaque involving the origin of the right renal artery, not resulting in hemodynamically significant stenosis. No vessel irregularity to suggest FMD. Left Renal artery: Solitary; there is eccentric probably calcified atherosclerotic plaque involving the origin of the left renal artery, not resulting in a hemodynamically significant stenosis. No vessel irregularity to suggest FMD. IMA: Occluded at its origin with early reconstitution via collateral supply from the SMA. Right-sided pelvic vasculature: There is a moderate amount of eccentric mixed calcified and noncalcified atherosclerotic plaque throughout the right common and external iliac arteries as well as the right common femoral artery, not definitely resulting in hemodynamically significant stenosis. The right internal iliac artery is diseased though patent of normal caliber. Left-sided pelvic vasculature: There is a moderate to large amount of extend mixed calcified and noncalcified atherosclerotic plaque throughout the left common iliac artery, not resulting in hemodynamically significant stenosis. There is eccentric predominantly calcified atherosclerotic plaque involving  the proximal aspect of the left external iliac artery which likely results in at least 50% luminal narrowing (image 207, series 4). There is eccentric mixed calcified and noncalcified atherosclerotic plaque which extends to involve the origin of the left superficial femoral artery and likely results in at least 50% luminal narrowing at this location (image 238, series 4). The left internal iliac artery is diseased though patent and of normal caliber.  Medical Decision Making  The patient is a 67 y.o. male who presents with asymptomatic AAA with no increase in size, based on limited visualization. Serum creatinine was 0.65 on 11-14-16.   I discussed with Dr. Imogene Burn that every duplex of pt's AAA has been obscured by excessive bowel gas; last CTA was December 2016 which showed 4.7 cm. Will recheck CTA abd/pelvis at [facility] (pt request) in 6 months, see Dr. Darrick Penna afterward.    Consideration for repair of AAA would be made when the size is 5.5 cm, growth > 1 cm/yr, and symptomatic status.        The patient was given information about AAA including signs, symptoms, treatment, and how to minimize the risk of enlargement and rupture of aneurysms.    I emphasized the importance of maximal medical management including strict control of blood pressure, blood glucose, and lipid levels, antiplatelet agents, obtaining regular exercise, and continued cessation of smoking.   The patient was advised to call 911 should the patient experience sudden onset abdominal or back pain.   Thank you for allowing [place] to participate in this patient's care.  Charisse March, RN, MSN, FNP-C [facility] [place] Office: [phone]  Clinic Physician: Chen/Cain  02/10/2017, 10:19 AM

## 2017-02-12 ENCOUNTER — Encounter: Payer: Self-pay | Admitting: Family

## 2017-02-21 ENCOUNTER — Other Ambulatory Visit: Payer: Self-pay | Admitting: Cardiovascular Disease

## 2017-03-07 ENCOUNTER — Telehealth: Payer: Self-pay | Admitting: *Deleted

## 2017-03-07 ENCOUNTER — Other Ambulatory Visit: Payer: Self-pay | Admitting: Family Medicine

## 2017-03-07 ENCOUNTER — Telehealth (INDEPENDENT_AMBULATORY_CARE_PROVIDER_SITE_OTHER): Payer: Medicare Other | Admitting: *Deleted

## 2017-03-07 DIAGNOSIS — I10 Essential (primary) hypertension: Secondary | ICD-10-CM | POA: Diagnosis not present

## 2017-03-07 DIAGNOSIS — N390 Urinary tract infection, site not specified: Secondary | ICD-10-CM | POA: Diagnosis not present

## 2017-03-07 DIAGNOSIS — E7849 Other hyperlipidemia: Secondary | ICD-10-CM | POA: Diagnosis not present

## 2017-03-07 DIAGNOSIS — Z125 Encounter for screening for malignant neoplasm of prostate: Secondary | ICD-10-CM | POA: Diagnosis not present

## 2017-03-07 DIAGNOSIS — Z79899 Other long term (current) drug therapy: Secondary | ICD-10-CM | POA: Diagnosis not present

## 2017-03-07 LAB — LIPID PANEL
Cholesterol: 129 mg/dL (ref <200)
HDL: 51 mg/dL (ref >40)
LDL Cholesterol (Calc): 61 mg/dL (calc)
Non-HDL Cholesterol (Calc): 78 mg/dL (calc) (ref <130)
Total CHOL/HDL Ratio: 2.5 (calc) (ref <5.0)
Triglycerides: 89 mg/dL (ref <150)

## 2017-03-07 LAB — BASIC METABOLIC PANEL WITH GFR
BUN/Creatinine Ratio: 19 (calc) (ref 6–22)
BUN: 13 mg/dL (ref 7–25)
CO2: 31 mmol/L (ref 20–32)
Calcium: 9.8 mg/dL (ref 8.6–10.3)
Chloride: 104 mmol/L (ref 98–110)
Creat: 0.69 mg/dL — ABNORMAL LOW (ref 0.70–1.25)
GFR, Est African American: 115 mL/min/{1.73_m2} (ref >=60)
GFR, Est Non African American: 100 mL/min/{1.73_m2} (ref >=60)
Glucose, Bld: 94 mg/dL (ref 65–99)
Potassium: 4.9 mmol/L (ref 3.5–5.3)
Sodium: 143 mmol/L (ref 135–146)

## 2017-03-07 LAB — POCT URINALYSIS DIPSTICK
Spec Grav, UA: 1.015 (ref 1.010–1.025)
pH, UA: 7 (ref 5.0–8.0)

## 2017-03-07 LAB — HEPATIC FUNCTION PANEL
AG Ratio: 1.7 (calc) (ref 1.0–2.5)
ALT: 33 U/L (ref 9–46)
AST: 28 U/L (ref 10–35)
Albumin: 4.3 g/dL (ref 3.6–5.1)
Alkaline phosphatase (APISO): 59 U/L (ref 40–115)
Bilirubin, Direct: 0.1 mg/dL (ref 0.0–0.2)
Globulin: 2.5 g/dL (calc) (ref 1.9–3.7)
Indirect Bilirubin: 0.4 mg/dL (calc) (ref 0.2–1.2)
Total Bilirubin: 0.5 mg/dL (ref 0.2–1.2)
Total Protein: 6.8 g/dL (ref 6.1–8.1)

## 2017-03-07 LAB — PSA: PSA: 2.8 ng/mL (ref <=4.0)

## 2017-03-07 NOTE — Telephone Encounter (Signed)
Pt came in today to drop off urine sample. Pt states he is not having any symptoms. Last urine culture states to repeat urine culture in 4 weeks. Urine culture sent.   [facility].  Pt's number [phone].

## 2017-03-08 ENCOUNTER — Telehealth: Payer: Self-pay | Admitting: *Deleted

## 2017-03-08 ENCOUNTER — Other Ambulatory Visit: Payer: Self-pay | Admitting: *Deleted

## 2017-03-08 ENCOUNTER — Encounter: Payer: Self-pay | Admitting: Family Medicine

## 2017-03-08 MED ORDER — FLUTICASONE PROPIONATE 50 MCG/ACT NA SUSP: 2.0000 | Freq: Every day | NASAL | 5 refills | Status: DC

## 2017-03-08 NOTE — Telephone Encounter (Signed)
[facility]ax [facility]. nasonex not covered by insurance. Change to flonase per dr Lorin Picketscott. Pt notified and rx for flonase sent to pharm.

## 2017-03-09 LAB — URINE CULTURE

## 2017-03-10 MED ORDER — AMOXICILLIN 500 MG PO CAPS: 500.0000 mg | ORAL_CAPSULE | Freq: Three times a day (TID) | ORAL | 0 refills | Status: AC

## 2017-03-10 NOTE — Addendum Note (Signed)
Addended by: Meredith LeedsSUTTON, Wetzel Meester L on: 03/10/2017 11:27 AM   Modules accepted: Orders

## 2017-03-16 ENCOUNTER — Encounter: Payer: Self-pay | Admitting: Family Medicine

## 2017-03-21 ENCOUNTER — Other Ambulatory Visit: Payer: Self-pay | Admitting: Physician Assistant

## 2017-04-10 ENCOUNTER — Telehealth: Payer: Self-pay | Admitting: Cardiovascular Disease

## 2017-04-10 MED ORDER — APIXABAN 5 MG PO TABS: 5.0000 mg | ORAL_TABLET | Freq: Two times a day (BID) | ORAL | 3 refills | Status: DC

## 2017-04-10 NOTE — Telephone Encounter (Signed)
RX SENT

## 2017-04-10 NOTE — Telephone Encounter (Signed)
°*  STAT* If patient is at the pharmacy, call can be transferred to refill team.   1. Which medications need to be refilled? ELIQUIS 5 MG TABS tablet    lisinopril (PRINIVIL,ZESTRIL) 20 MG tablet    2. Which pharmacy/location (including street and city if local pharmacy) is medication to be sent to? [facility]  3. Do they need a 30 day or 90 day supply? 90

## 2017-04-11 ENCOUNTER — Other Ambulatory Visit: Payer: Self-pay

## 2017-04-11 MED ORDER — LISINOPRIL 20 MG PO TABS: 20.0000 mg | ORAL_TABLET | Freq: Every day | ORAL | 3 refills | Status: DC

## 2017-04-11 MED ORDER — APIXABAN 5 MG PO TABS: 5.0000 mg | ORAL_TABLET | Freq: Two times a day (BID) | ORAL | 3 refills | Status: DC

## 2017-05-01 ENCOUNTER — Other Ambulatory Visit: Payer: Self-pay

## 2017-05-01 MED ORDER — ATORVASTATIN CALCIUM 80 MG PO TABS: 80.0000 mg | ORAL_TABLET | Freq: Every day | ORAL | 1 refills | Status: DC

## 2017-05-05 ENCOUNTER — Ambulatory Visit (INDEPENDENT_AMBULATORY_CARE_PROVIDER_SITE_OTHER): Payer: Medicare Other | Admitting: Urology

## 2017-05-05 ENCOUNTER — Other Ambulatory Visit (HOSPITAL_COMMUNITY)
Admission: RE | Admit: 2017-05-05 | Discharge: 2017-05-05 | Disposition: A | Discharge status: Home / Self Care | Payer: Medicare Other | Source: Other Acute Inpatient Hospital | Attending: Urology | Admitting: Urology

## 2017-05-05 DIAGNOSIS — N3020 Other chronic cystitis without hematuria: Secondary | ICD-10-CM

## 2017-05-05 DIAGNOSIS — N281 Cyst of kidney, acquired: Secondary | ICD-10-CM

## 2017-05-05 LAB — URINALYSIS, COMPLETE (UACMP) WITH MICROSCOPIC
Bilirubin Urine: NEGATIVE
Glucose, UA: NEGATIVE mg/dL
Hgb urine dipstick: NEGATIVE
Ketones, ur: NEGATIVE mg/dL
Nitrite: NEGATIVE
Protein, ur: NEGATIVE mg/dL
Specific Gravity, Urine: 1.004 — ABNORMAL LOW (ref 1.005–1.030)
Squamous Epithelial / LPF: NONE SEEN
pH: 6 (ref 5.0–8.0)

## 2017-05-06 LAB — URINE CULTURE: Culture: NO GROWTH

## 2017-06-02 ENCOUNTER — Other Ambulatory Visit: Payer: Self-pay | Admitting: Family Medicine

## 2017-06-02 ENCOUNTER — Ambulatory Visit (INDEPENDENT_AMBULATORY_CARE_PROVIDER_SITE_OTHER): Payer: Medicare Other | Admitting: Family Medicine

## 2017-06-02 ENCOUNTER — Encounter: Payer: Self-pay | Admitting: Family Medicine

## 2017-06-02 VITALS — BP 132/84 | Ht 69.0 in | Wt 205.0 lb

## 2017-06-02 DIAGNOSIS — E7849 Other hyperlipidemia: Secondary | ICD-10-CM

## 2017-06-02 DIAGNOSIS — I480 Paroxysmal atrial fibrillation: Secondary | ICD-10-CM

## 2017-06-02 DIAGNOSIS — J449 Chronic obstructive pulmonary disease, unspecified: Secondary | ICD-10-CM

## 2017-06-02 DIAGNOSIS — J019 Acute sinusitis, unspecified: Secondary | ICD-10-CM

## 2017-06-02 MED ORDER — PREDNISONE 20 MG PO TABS: ORAL_TABLET | ORAL | 0 refills | Status: DC

## 2017-06-02 MED ORDER — ZOSTER VAC RECOMB ADJUVANTED 50 MCG/0.5ML IM SUSR: 0.5000 mL | Freq: Once | INTRAMUSCULAR | 0 refills | Status: AC

## 2017-06-02 MED ORDER — CEFPROZIL 500 MG PO TABS: 500.0000 mg | ORAL_TABLET | Freq: Two times a day (BID) | ORAL | 0 refills | Status: DC

## 2017-06-02 NOTE — Patient Instructions (Signed)
Call us early OCT to get labwork orders

## 2017-06-02 NOTE — Progress Notes (Signed)
Subjective:    Patient ID: [name], male    DOB: [birth year], 67 y.o.   MRN: [id number]  Hypertension  This is a chronic problem. The current episode started more than 1 year ago. Associated symptoms include shortness of breath. Pertinent negatives include no chest pain or headaches. Risk factors for coronary artery disease include male gender. Treatments tried: lisinopril. There are no compliance problems.    Patient with significant head congestion drainage coughing denies high fever chills sweats.  Relates a little bit of chest tightness when takes a deep breath but denies any angina.  Denies any nausea or vomiting with it.  States using his inhaler medicines as usual.  Patient does have underlying COPD medication doing a good job keeping things under control but recently with this upper respiratory illness he does feel like he started to have some chest tightness but no true congestion  He also has atrial fibrillation and underlying heart disease issues but he denies any angina issues denies his heart running fast states he is tolerating his medicine and not having any bleeding issues  Patient also is being followed by urology for frequent UTIs he has not had any recently in your recent urine culture was negative urology plans on doing a cystoscope hopefully he will not have to have any type of prostate surgery  Significant allergic rhinitis with head congestion runny nose sneezing coughing denies wheezing difficulty breathing with this this is been going on ever since the pollen has been going on   Review of Systems  Constitutional: Positive for fatigue. Negative for activity change, chills and fever.  HENT: Positive for congestion and rhinorrhea. Negative for ear pain.   Eyes: Negative for discharge.  Respiratory: Positive for cough, chest tightness and shortness of breath. Negative for wheezing.   Cardiovascular: Negative for chest pain and leg swelling.  Gastrointestinal:  Negative for abdominal pain, diarrhea, nausea and vomiting.  Genitourinary: Negative for dysuria and hematuria.  Musculoskeletal: Negative for arthralgias.  Neurological: Negative for weakness and headaches.  Psychiatric/Behavioral: Negative for behavioral problems.       Objective:   Physical Exam  Constitutional: He appears well-developed and well-nourished. No distress.  HENT:  Head: Normocephalic and atraumatic.  Mouth/Throat: Oropharynx is clear and moist. No oropharyngeal exudate.  Eyes: Right eye exhibits no discharge. Left eye exhibits no discharge.  Neck: Normal range of motion. No tracheal deviation present.  Cardiovascular: Normal rate, regular rhythm and normal heart sounds.  No murmur heard. Pulmonary/Chest: Effort normal and breath sounds normal. No respiratory distress. He has no wheezes. He has no rales.  Musculoskeletal: He exhibits no edema.  Lymphadenopathy:    He has no cervical adenopathy.  Neurological: He is alert. He exhibits normal muscle tone.  Skin: Skin is warm and dry. No rash noted.  Psychiatric: His behavior is normal.  Nursing note and vitals reviewed.         Assessment & Plan:  / Hyperlipidemia- patient needs to continue medication on a regular basis.  We will do comprehensive lab work later this year.  Previous labs reviewed  Sinusitis antibiotics prescribed warning signs discussed follow-up if progressive troubles  COPD no severe flareup by having some more coughing prescription for prednisone given that he can fill if this gets progressively worse  Atrial fib under good control currently.  Eating problems going on.  Shingles vaccine discuss not to do anywhere near prednisone by 30 days  Frequent urinary tract infections probably related to enlarged prostate  he is seeing urology again in few months he might do a cystoscope he will also have a CAT scan later this summer he will call us when he has it completed we will look to see how his  kidneys are he has a history of a renal cyst  25 minutes was spent with the patient.  This statement verifies that 25 minutes was indeed spent with the patient. Greater than half the time was spent in discussion, counseling and answering questions  regarding the issues that the patient came in for today as reflected in the diagnosis (s) please refer to documentation for further details. Greater than half time spent discussing COPD infection blood pressure atrial fib and frequent urinary tract infection

## 2017-06-23 ENCOUNTER — Ambulatory Visit (INDEPENDENT_AMBULATORY_CARE_PROVIDER_SITE_OTHER): Payer: Medicare Other | Admitting: Urology

## 2017-06-23 DIAGNOSIS — N401 Enlarged prostate with lower urinary tract symptoms: Secondary | ICD-10-CM

## 2017-08-01 ENCOUNTER — Encounter: Payer: Self-pay | Admitting: Cardiovascular Disease

## 2017-08-01 ENCOUNTER — Ambulatory Visit (INDEPENDENT_AMBULATORY_CARE_PROVIDER_SITE_OTHER): Payer: Medicare Other | Admitting: Cardiovascular Disease

## 2017-08-01 VITALS — BP 130/70 | HR 90 | Ht 69.0 in | Wt 206.0 lb

## 2017-08-01 DIAGNOSIS — I480 Paroxysmal atrial fibrillation: Secondary | ICD-10-CM | POA: Diagnosis not present

## 2017-08-01 DIAGNOSIS — Z951 Presence of aortocoronary bypass graft: Secondary | ICD-10-CM

## 2017-08-01 DIAGNOSIS — I714 Abdominal aortic aneurysm, without rupture, unspecified: Secondary | ICD-10-CM

## 2017-08-01 DIAGNOSIS — I25708 Atherosclerosis of coronary artery bypass graft(s), unspecified, with other forms of angina pectoris: Secondary | ICD-10-CM

## 2017-08-01 DIAGNOSIS — I10 Essential (primary) hypertension: Secondary | ICD-10-CM

## 2017-08-01 DIAGNOSIS — Z952 Presence of prosthetic heart valve: Secondary | ICD-10-CM | POA: Diagnosis not present

## 2017-08-01 DIAGNOSIS — E785 Hyperlipidemia, unspecified: Secondary | ICD-10-CM

## 2017-08-01 NOTE — Patient Instructions (Signed)

## 2017-08-01 NOTE — Progress Notes (Signed)
SUBJECTIVE: The patient presents for routine follow-up.  He is doing well.  He denies chest pain, leg swelling, orthopnea, and palpitations.  He takes aspirin and Eliquis and denies hematochezia and melena.  He has questions about potentially switching to warfarin as apixaban along with his COPD medication is costing $350 per month.  He has chronic exertional dyspnea which is stable.  He mows his own lawn and tries to do some walking.   He underwent 2 vessel CABG with LIMA to the LAD and SVG to the circumflex on 09/06/16. He also went aortic valve replacement with a 23 mmMagnaEase pericardial tissue valve. He developed postoperative atrial fibrillation and flutter going out to postoperative day 12and was started on amiodarone.  Echocardiogram 09/14/16: Vigorous left ventricular systolic function, LVEF 65-70%, normal regional wall motion, normally functioning 23 mmMagnaEase pericardial bioprosthesis. The left atrium and right atrium and right ventricle were mildly dilated.    Review of Systems: As per "subjective", otherwise negative.  Allergies  Allergen Reactions  . Ciprofloxacin     Artificial heart valve    Current Outpatient Medications  Medication Sig Dispense Refill  . acetaminophen (TYLENOL) 500 MG tablet Take 500 mg by mouth every 6 (six) hours as needed.    [name]. apixaban (ELIQUIS) 5 MG TABS tablet Take 1 tablet (5 mg total) by mouth 2 (two) times daily. 180 tablet 3  . aspirin 81 MG tablet Take 81 mg by mouth at bedtime.     [name]. atorvastatin (LIPITOR) 80 MG tablet Take 1 tablet (80 mg total) by mouth daily. 90 tablet 1  . diltiazem (CARDIZEM CD) 120 MG 24 hr capsule Take 1 capsule (120 mg total) daily by mouth. 30 capsule 6  . ferrous sulfate 325 (65 FE) MG tablet Take 325 mg by mouth daily with breakfast.    . fexofenadine (ALLEGRA) 180 MG tablet Take 180 mg by mouth daily.    . fluticasone (FLONASE) 50 MCG/ACT nasal spray Place 2 sprays into both nostrils daily. 16 g 5    . lisinopril (PRINIVIL,ZESTRIL) 20 MG tablet Take 1 tablet (20 mg total) by mouth daily. 90 tablet 3  . Multiple Vitamin (MULTIVITAMIN) tablet Take 1 tablet by mouth daily.    . VENTOLIN HFA 108 (90 Base) MCG/ACT inhaler INHALE 2 PUFFS BY MOUTH EVERY 4 HOURS AS NEEDED 18 g 2  . vitamin C (ASCORBIC ACID) 500 MG tablet Take 500 mg by mouth daily. Reported on 07/16/2015     No current facility-administered medications for this visit.     Past Medical History:  Diagnosis Date  . AAA (abdominal aortic aneurysm) (HCC)   . COPD (chronic obstructive pulmonary disease) (HCC)   . Fatty liver   . Hyperlipidemia   . Hypertension   . Severe aortic stenosis 08/29/2016  . White coat hypertension     Past Surgical History:  Procedure Laterality Date  . ANKLE SURGERY Right 2009  . AORTIC VALVE REPLACEMENT N/A 09/05/2016   Procedure: AORTIC VALVE REPLACEMENT (AVR) USING 23 MM MAGNA EASE PERICARDIAL TISSUE VALVE;  Surgeon: Delight OvensGerhardt, Edward B, MD;  [facility];  Service: Open Heart Surgery;  Laterality: N/A;  . APPENDECTOMY    . CARDIAC CATHETERIZATION N/A 02/02/2016   Procedure: Left Heart Cath and Coronary Angiography;  Surgeon: Rinaldo CloudMohan Harwani, MD;  [facility];  Service: Cardiovascular;  Laterality: N/A;  . COLONOSCOPY  06/22/2010   [id number]:Diminutive rectal and right colon polyps removed remainder rectum and colon appared normal  .  COLONOSCOPY WITH PROPOFOL N/A 05/15/2014   Procedure: COLONOSCOPY WITH PROPOFOL;  Surgeon: Corbin Ade, MD;  Location: [facility] ORS;  Service: Endoscopy;  Laterality: N/A;  In cecum @ [id number], out @ 0937, withdrawal time 19 minutes  . CORONARY ARTERY BYPASS GRAFT N/A 09/05/2016   Procedure: CORONARY ARTERY BYPASS GRAFTING (CABG) USING LIMA TO DISTAL LAD AND ENDOSCOPICALLY HARVESTED GREATER SAPHENOUS VEIN TO CIRC.;  Surgeon: Delight Ovens, MD;  Location: [facility] OR;  Service: Open Heart Surgery;  Laterality: N/A;  . ENDOVEIN HARVEST OF GREATER SAPHENOUS VEIN Right  09/05/2016   Procedure: ENDOVEIN HARVEST OF GREATER SAPHENOUS VEIN;  Surgeon: Delight Ovens, MD;  Location: [facility] OR;  Service: Open Heart Surgery;  Laterality: Right;  . POLYPECTOMY N/A 05/15/2014   Procedure: POLYPECTOMY;  Surgeon: Corbin Ade, MD;  Location: [facility] ORS;  Service: Endoscopy;  Laterality: N/A;  . RIGHT/LEFT HEART CATH AND CORONARY ANGIOGRAPHY N/A 08/29/2016   Procedure: Right/Left Heart Cath and Coronary Angiography;  Surgeon: Tonny Bollman, MD;  Location[facility] INVASIVE CV LAB;  Service: Cardiovascular;  Laterality: N/A;  . TEE WITHOUT CARDIOVERSION N/A 09/05/2016   Procedure: TRANSESOPHAGEAL ECHOCARDIOGRAM (TEE);  Surgeon: Delight Ovens, MD;  Location: [facility] OR;  Service: Open Heart Surgery;  Laterality: N/A;    Social History   Socioeconomic History  . Marital status: Married    Spouse name: Not on file  . Number of children: Not on file  . Years of education: Not on file  . Highest education level: Not on file  Occupational History  . Not on file  Social Needs  . Financial resource strain: Not on file  . Food insecurity:    Worry: Not on file    Inability: Not on file  . Transportation needs:    Medical: Not on file    Non-medical: Not on file  Tobacco Use  . Smoking status: Former Smoker    Packs/day: 1.50    Years: 40.00    Pack years: 60.00    Types: Cigarettes    Last attempt to quit: 04/16/2002    Years since quitting: 15.3  . Smokeless tobacco: Never Used  Substance and Sexual Activity  . Alcohol use: Yes    Alcohol/week: 3.0 oz    Types: 5 Cans of beer per week    Comment: per pt  . Drug use: No  . Sexual activity: Never    Birth control/protection: None  Lifestyle  . Physical activity:    Days per week: Not on file    Minutes per session: Not on file  . Stress: Not on file  Relationships  . Social connections:    Talks on phone: Not on file    Gets together: Not on file    Attends religious service: Not on file    Active member of club  or organization: Not on file    Attends meetings of clubs or organizations: Not on file    Relationship status: Not on file  . Intimate partner violence:    Fear of current or ex partner: Not on file    Emotionally abused: Not on file    Physically abused: Not on file    Forced sexual activity: Not on file  Other Topics Concern  . Not on file  Social History Narrative   [facility] (09/13/16):   Previously worked in a Tax inspector. Also worked Production designer, theatre/television/film. Currently has an outdoor cat and no indoor pets. No bird or mold exposure.  Vitals:   08/01/17 0819  BP: 130/70  Pulse: 90  SpO2: 92%  Weight: 206 lb (93.4 kg)  Height: 5\' 9"  (1.753 m)    Wt Readings from Last 3 Encounters:  08/01/17 206 lb (93.4 kg)  06/02/17 205 lb (93 kg)  02/10/17 194 lb (88 kg)     PHYSICAL EXAM General: NAD HEENT: Normal. Neck: No JVD, no thyromegaly. Lungs: Clear to auscultation bilaterally with normal respiratory effort. CV: Regular rate and rhythm, normal S1/S2, no S3/S4, no murmur. No pretibial or periankle edema.  No carotid bruit.   Abdomen: Soft, nontender, no distention.  Neurologic: Alert and oriented.  Psych: Normal affect. Skin: Normal. Musculoskeletal: No gross deformities.    ECG: Most recent ECG reviewed.   Labs: Lab Results  Component Value Date/Time   K 4.9 03/07/2017 07:41 AM   BUN 13 03/07/2017 07:41 AM   BUN 9 05/18/2015 08:34 AM   CREATININE 0.69 (L) 03/07/2017 07:41 AM   ALT 33 03/07/2017 07:41 AM   TSH 1.701 09/12/2016 01:19 PM   HGB 15.3 11/14/2016 08:20 AM   HGB 15.8 06/13/2015 09:06 AM     Lipids: Lab Results  Component Value Date/Time   LDLCALC 61 03/07/2017 07:41 AM   CHOL 129 03/07/2017 07:41 AM   CHOL 128 05/18/2015 08:34 AM   TRIG 89 03/07/2017 07:41 AM   HDL 51 03/07/2017 07:41 AM   HDL 43 05/18/2015 08:34 AM       ASSESSMENT AND PLAN: 1.  Coronary artery disease status post two-vessel CABG: Symptomatically stable.   Continue aspirin and statin.     2.  Status post aortic valve replacement with a bioprosthetic valve: Stable.  Functioning normally by most recent echocardiogram detailed above.  3.  Paroxysmal atrial fibrillation and flutter: Symptomatically stable without palpitations.  As he had postoperative atrial fibrillation out today 12, and due to his concomitant lung disease, I am concerned about the possibility of recurrence of atrial fibrillation.  For this reason, I will continue anticoagulation with apixaban 5 mg twice daily.  He may consider switching to warfarin due to cost.  Continue long-acting diltiazem.  4.  Hypertension: Controlled.  No change to therapy.  5.  Hyperlipidemia: I reviewed lipids dated 03/07/2017 which showed total cholesterol 129, HDL 51, triglycerides 89, LDL 61.  Continue statin therapy at present dose.  6.  Abdominal aortic aneurysm: Followed by vascular surgery.  4.6 cm in diameter on 02/02/2017.   Disposition: Follow up 6 months   Prentice Docker, M.D., F.A.C.C.

## 2017-08-03 DIAGNOSIS — I10 Essential (primary) hypertension: Secondary | ICD-10-CM | POA: Diagnosis not present

## 2017-08-03 DIAGNOSIS — J449 Chronic obstructive pulmonary disease, unspecified: Secondary | ICD-10-CM | POA: Diagnosis not present

## 2017-08-03 DIAGNOSIS — I2510 Atherosclerotic heart disease of native coronary artery without angina pectoris: Secondary | ICD-10-CM | POA: Diagnosis not present

## 2017-08-03 DIAGNOSIS — I480 Paroxysmal atrial fibrillation: Secondary | ICD-10-CM | POA: Diagnosis not present

## 2017-08-08 ENCOUNTER — Ambulatory Visit (HOSPITAL_COMMUNITY)
Admission: RE | Admit: 2017-08-08 | Discharge: 2017-08-08 | Disposition: A | Discharge status: Home / Self Care | Payer: Medicare Other | Source: Ambulatory Visit | Attending: Family | Admitting: Family

## 2017-08-08 DIAGNOSIS — Z87891 Personal history of nicotine dependence: Secondary | ICD-10-CM | POA: Insufficient documentation

## 2017-08-08 DIAGNOSIS — I714 Abdominal aortic aneurysm, without rupture, unspecified: Secondary | ICD-10-CM

## 2017-08-08 LAB — POCT I-STAT CREATININE: Creatinine, Ser: 0.8 mg/dL (ref 0.61–1.24)

## 2017-08-08 MED ORDER — IOPAMIDOL (ISOVUE-370) INJECTION 76%
100.0000 mL | Freq: Once | INTRAVENOUS | Status: AC | PRN
  Administered 2017-08-08: 100 mL via INTRAVENOUS

## 2017-08-14 ENCOUNTER — Other Ambulatory Visit: Payer: Self-pay | Admitting: Family Medicine

## 2017-08-15 ENCOUNTER — Telehealth: Payer: Self-pay | Admitting: Family Medicine

## 2017-08-15 ENCOUNTER — Encounter: Payer: Self-pay | Admitting: Family Medicine

## 2017-08-15 NOTE — Telephone Encounter (Signed)
Pts wife sent my chart message on patient. Message states "Dr. Lorin PicketScott. You wanted [name] to let you know when the CT scan of his abdominal aortic aneurysm was on my chart. It is on there today. He has an appointment with Dr.Fields on Thursday. Thank Sharman CheekMarilyn Pickford." Will reply to patient that Dr.[name] is out of office until next week.

## 2017-08-17 ENCOUNTER — Encounter: Payer: Self-pay | Admitting: Vascular Surgery

## 2017-08-17 ENCOUNTER — Other Ambulatory Visit: Payer: Self-pay

## 2017-08-17 ENCOUNTER — Ambulatory Visit (INDEPENDENT_AMBULATORY_CARE_PROVIDER_SITE_OTHER): Payer: Medicare Other | Admitting: Vascular Surgery

## 2017-08-17 VITALS — BP 116/77 | HR 76 | Temp 97.6°F | Resp 18 | Ht 69.0 in | Wt 205.2 lb

## 2017-08-17 DIAGNOSIS — I25708 Atherosclerosis of coronary artery bypass graft(s), unspecified, with other forms of angina pectoris: Secondary | ICD-10-CM

## 2017-08-17 DIAGNOSIS — I714 Abdominal aortic aneurysm, without rupture, unspecified: Secondary | ICD-10-CM

## 2017-08-17 NOTE — Progress Notes (Signed)
Patient name: [name] MRN: [id number] DOB: 07/20/1950 Sex: male   HPI: [name] is a 67 y.o. male returns for follow-up today for a known infrarenal abdominal aortic aneurysm.  He was last seen in January 2019 at which time the aneurysm was 4.7 cm in diameter.  He continues to deny any significant abdominal or back pain.  Other medical problems include lipidemia hypertension aortic stenosis all of which are currently stable.  He has had aortic valve replacement in the past and is currently followed by Dr. Lucinda Dell for his cardiology issues.  He is on aspirin and statin.    Past Medical History:  Diagnosis Date  . AAA (abdominal aortic aneurysm) (HCC)   . COPD (chronic obstructive pulmonary disease) (HCC)   . Fatty liver   . Hyperlipidemia   . Hypertension   . Severe aortic stenosis 08/29/2016  . White coat hypertension    Past Surgical History:  Procedure Laterality Date  . ANKLE SURGERY Right 2009  . AORTIC VALVE REPLACEMENT N/A 09/05/2016   Procedure: AORTIC VALVE REPLACEMENT (AVR) USING 23 MM MAGNA EASE PERICARDIAL TISSUE VALVE;  Surgeon: Delight Ovens, MD;  [facility] OR;  Service: Open Heart Surgery;  Laterality: N/A;  . APPENDECTOMY    . CARDIAC CATHETERIZATION N/A 02/02/2016   Procedure: Left Heart Cath and Coronary Angiography;  Surgeon: Rinaldo Cloud, MD;  Location: [facility] INVASIVE CV LAB;  Service: Cardiovascular;  Laterality: N/A;  . COLONOSCOPY  06/22/2010   [id number] rectal and right colon polyps removed remainder rectum and colon appared normal  . COLONOSCOPY WITH PROPOFOL N/A 05/15/2014   Procedure: COLONOSCOPY WITH PROPOFOL;  Surgeon: Corbin Ade, MD;  Location: [facility] ORS;  Service: Endoscopy;  Laterality: N/A;  In cecum @ [id number], out @ 0937, withdrawal time 19 minutes  . CORONARY ARTERY BYPASS GRAFT N/A 09/05/2016   Procedure: CORONARY ARTERY BYPASS GRAFTING (CABG) USING LIMA TO DISTAL LAD AND ENDOSCOPICALLY HARVESTED GREATER SAPHENOUS  VEIN TO CIRC.;  Surgeon: Delight Ovens, MD;  Location: [facility] OR;  Service: Open Heart Surgery;  Laterality: N/A;  . ENDOVEIN HARVEST OF GREATER SAPHENOUS VEIN Right 09/05/2016   Procedure: ENDOVEIN HARVEST OF GREATER SAPHENOUS VEIN;  Surgeon: Delight Ovens, MD;  Location: [facility] OR;  Service: Open Heart Surgery;  Laterality: Right;  . POLYPECTOMY N/A 05/15/2014   Procedure: POLYPECTOMY;  Surgeon: Corbin Ade, MD;  Location: [facility] ORS;  Service: Endoscopy;  Laterality: N/A;  . RIGHT/LEFT HEART CATH AND CORONARY ANGIOGRAPHY N/A 08/29/2016   Procedure: Right/Left Heart Cath and Coronary Angiography;  Surgeon: Tonny Bollman, MD;  Location: [facility] INVASIVE CV LAB;  Service: Cardiovascular;  Laterality: N/A;  . TEE WITHOUT CARDIOVERSION N/A 09/05/2016   Procedure: TRANSESOPHAGEAL ECHOCARDIOGRAM (TEE);  Surgeon: Delight Ovens, MD;  Location: [facility] OR;  Service: Open Heart Surgery;  Laterality: N/A;    Family History  Problem Relation Age of Onset  . COPD Mother   . Hypertension Sister   . AAA (abdominal aortic aneurysm) Brother   . Colon cancer Neg Hx   . Liver disease Neg Hx     SOCIAL HISTORY: Social History   Socioeconomic History  . Marital status: Married    Spouse name: Not on file  . Number of children: Not on file  . Years of education: Not on file  . Highest education level: Not on file  Occupational History  . Not on file  Social Needs  . Financial resource strain: Not on file  .  Food insecurity:    Worry: Not on file    Inability: Not on file  . Transportation needs:    Medical: Not on file    Non-medical: Not on file  Tobacco Use  . Smoking status: Former Smoker    Packs/day: 1.50    Years: 40.00    Pack years: 60.00    Types: Cigarettes    Last attempt to quit: 04/16/2002    Years since quitting: 15.3  . Smokeless tobacco: Never Used  Substance and Sexual Activity  . Alcohol use: Yes    Alcohol/week: 3.0 oz    Types: 5 Cans of beer per week    Comment: per pt  .  Drug use: No  . Sexual activity: Never    Birth control/protection: None  Lifestyle  . Physical activity:    Days per week: Not on file    Minutes per session: Not on file  . Stress: Not on file  Relationships  . Social connections:    Talks on phone: Not on file    Gets together: Not on file    Attends religious service: Not on file    Active member of club or organization: Not on file    Attends meetings of clubs or organizations: Not on file    Relationship status: Not on file  . Intimate partner violence:    Fear of current or ex partner: Not on file    Emotionally abused: Not on file    Physically abused: Not on file    Forced sexual activity: Not on file  Other Topics Concern  . Not on file  Social History Narrative   [facility] (09/13/16):   Previously worked in a Tax inspector. Also worked Production designer, theatre/television/film. Currently has an outdoor cat and no indoor pets. No bird or mold exposure.    Allergies  Allergen Reactions  . Ciprofloxacin     Artificial heart valve    Current Outpatient Medications  Medication Sig Dispense Refill  . acetaminophen (TYLENOL) 500 MG tablet Take 500 mg by mouth every 6 (six) hours as needed.    [name] apixaban (ELIQUIS) 5 MG TABS tablet Take 1 tablet (5 mg total) by mouth 2 (two) times daily. 180 tablet 3  . aspirin 81 MG tablet Take 81 mg by mouth at bedtime.     [name] atorvastatin (LIPITOR) 80 MG tablet Take 1 tablet (80 mg total) by mouth daily. 90 tablet 1  . diltiazem (CARDIZEM CD) 120 MG 24 hr capsule TAKE 1 CAPSULE BY MOUTH EVERY DAY 30 capsule 6  . ferrous sulfate 325 (65 FE) MG tablet Take 325 mg by mouth daily with breakfast.    . fexofenadine (ALLEGRA) 180 MG tablet Take 180 mg by mouth daily.    . fluticasone (FLONASE) 50 MCG/ACT nasal spray Place 2 sprays into both nostrils daily. 16 g 5  . lisinopril (PRINIVIL,ZESTRIL) 20 MG tablet Take 1 tablet (20 mg total) by mouth daily. 90 tablet 3  . Multiple Vitamin (MULTIVITAMIN) tablet  Take 1 tablet by mouth daily.    . VENTOLIN HFA 108 (90 Base) MCG/ACT inhaler INHALE 2 PUFFS BY MOUTH EVERY 4 HOURS AS NEEDED 18 g 2  . vitamin C (ASCORBIC ACID) 500 MG tablet Take 500 mg by mouth daily. Reported on 07/16/2015     No current facility-administered medications for this visit.     ROS:   General:  No weight loss, Fever, chills  HEENT: No recent headaches, no nasal bleeding,  no visual changes, no sore throat  Neurologic: No dizziness, blackouts, seizures. No recent symptoms of stroke or mini- stroke. No recent episodes of slurred speech, or temporary blindness.  Cardiac: No recent episodes of chest pain/pressure, no shortness of breath at rest.  No shortness of breath with exertion.  Denies history of atrial fibrillation or irregular heartbeat  Vascular: No history of rest pain in feet.  No history of claudication.  No history of non-healing ulcer, No history of DVT   Pulmonary: No home oxygen, no productive cough, no hemoptysis,  No asthma or wheezing  Musculoskeletal:  [ ]  Arthritis, [ ]  Low back pain,  [ ]  Joint pain  Hematologic:No history of hypercoagulable state.  No history of easy bleeding.  No history of anemia  Gastrointestinal: No hematochezia or melena,  No gastroesophageal reflux, no trouble swallowing  Urinary: [ ]  chronic Kidney disease, [ ]  on HD - [ ]  MWF or [ ]  TTHS, [ ]  Burning with urination, [ ]  Frequent urination, [ ]  Difficulty urinating;   Skin: No rashes  Psychological: No history of anxiety,  No history of depression   Physical Examination  Vitals:   08/17/17 1153  BP: 116/77  Pulse: 76  Resp: 18  Temp: 97.6 F (36.4 C)  TempSrc: Oral  SpO2: 94%  Weight: 205 lb 3.2 oz (93.1 kg)  Height: 5\' 9"  (1.753 m)    Body mass index is 30.3 kg/m.  General:  Alert and oriented, no acute distress HEENT: Normal Neck: No bruit or JVD Pulmonary: Clear to auscultation bilaterally Cardiac: Regular Rate and Rhythm without murmur Abdomen: Soft,  non-tender, non-distended, no mass, obese Skin: No rash Extremity Pulses:  2+ radial, brachial, femoral, 2+ dorsalis pedis pulse left side absent pedal pulses right side Musculoskeletal: No deformity or edema  Neurologic: Upper and lower extremity motor 5/5 and symmetric  DATA:  Patient had a CT angiogram performed August 08, 2017.  This shows the aneurysm has now grown to 5.2 cm in diameter.  He has a reasonable neck and iliac access for possible stent grafting.  I reviewed these images personally.  ASSESSMENT: Growing infrarenal abdominal aortic aneurysm now 5.2 cm in diameter.   PLAN: Discussed with the patient today Gore Excluder stent graft repair of his abdominal aortic aneurysm.  We should be able to do this percutaneously.  The patient's wife is getting ready to have an orthopedic procedure in the near future so he wished to defer this until October 2019.  In the meanwhile we will work on getting cardiac risk stratification from Dr. Purvis SheffieldKoneswaran.  The patient will return to see me in the office October for final planning for his endovascular stent graft.  Fabienne Brunsharles Fields, MD [facility] Office: [phone] Pager: [phone]

## 2017-10-20 ENCOUNTER — Other Ambulatory Visit: Payer: Self-pay | Admitting: Family Medicine

## 2017-10-30 ENCOUNTER — Other Ambulatory Visit: Payer: Self-pay | Admitting: Cardiovascular Disease

## 2017-11-01 ENCOUNTER — Other Ambulatory Visit: Payer: Self-pay

## 2017-11-01 ENCOUNTER — Encounter: Payer: Self-pay | Admitting: Family Medicine

## 2017-11-01 ENCOUNTER — Ambulatory Visit: Payer: Medicare Other | Admitting: Family Medicine

## 2017-11-01 ENCOUNTER — Ambulatory Visit (INDEPENDENT_AMBULATORY_CARE_PROVIDER_SITE_OTHER): Payer: Medicare Other | Admitting: Family Medicine

## 2017-11-01 VITALS — BP 148/90 | HR 109 | Temp 99.1°F | Ht 69.0 in | Wt 204.0 lb

## 2017-11-01 DIAGNOSIS — I159 Secondary hypertension, unspecified: Secondary | ICD-10-CM

## 2017-11-01 DIAGNOSIS — J019 Acute sinusitis, unspecified: Secondary | ICD-10-CM

## 2017-11-01 DIAGNOSIS — I25708 Atherosclerosis of coronary artery bypass graft(s), unspecified, with other forms of angina pectoris: Secondary | ICD-10-CM

## 2017-11-01 DIAGNOSIS — J441 Chronic obstructive pulmonary disease with (acute) exacerbation: Secondary | ICD-10-CM | POA: Diagnosis not present

## 2017-11-01 DIAGNOSIS — B9689 Other specified bacterial agents as the cause of diseases classified elsewhere: Secondary | ICD-10-CM

## 2017-11-01 DIAGNOSIS — Z7901 Long term (current) use of anticoagulants: Secondary | ICD-10-CM

## 2017-11-01 DIAGNOSIS — Z125 Encounter for screening for malignant neoplasm of prostate: Secondary | ICD-10-CM

## 2017-11-01 DIAGNOSIS — E7849 Other hyperlipidemia: Secondary | ICD-10-CM

## 2017-11-01 MED ORDER — AMOXICILLIN-POT CLAVULANATE 875-125 MG PO TABS: 1.0000 | ORAL_TABLET | Freq: Two times a day (BID) | ORAL | 0 refills | Status: DC

## 2017-11-01 MED ORDER — PREDNISONE 20 MG PO TABS: ORAL_TABLET | ORAL | 0 refills | Status: DC

## 2017-11-01 NOTE — Progress Notes (Signed)
   Subjective:    Patient ID: [name], male    DOB: [birth year], 67 y.o.   MRN: [id number]  HPI  Patient is here today with what he states is a flare of COPD flare. Has a cough,and congestion.He also states he has a catch in left side when he moves or coughs. He states he has been taking his inhaler and trelogy, and tylenol.  Review of Systems  Constitutional: Negative for activity change, chills and fever.  HENT: Positive for congestion and rhinorrhea. Negative for ear pain.   Eyes: Negative for discharge.  Respiratory: Positive for cough. Negative for shortness of breath and wheezing.   Cardiovascular: Negative for chest pain.  Gastrointestinal: Negative for nausea and vomiting.  Musculoskeletal: Negative for arthralgias.       Objective:   Physical Exam  Constitutional: He appears well-developed.  HENT:  Head: Normocephalic.  Mouth/Throat: Oropharynx is clear and moist. No oropharyngeal exudate.  Neck: Normal range of motion.  Cardiovascular: Normal rate, regular rhythm and normal heart sounds.  No murmur heard. Pulmonary/Chest: Effort normal and breath sounds normal. He has no wheezes.  Lymphadenopathy:    He has no cervical adenopathy.  Neurological: He exhibits normal muscle tone.  Skin: Skin is warm and dry.  Nursing note and vitals reviewed.    Abdominal exam totally soft no tenderness no guarding no rebound patient has low back pain and discomfort with rotation and bending to the side    Assessment & Plan:  Significant upper respiratory illness Acute rhinosinusitis Mild flareup of COPD Prednisone 20 mg, take 2 tablets daily for the next 5 days Antibiotics prescribed Warning signs discussed Follow-up if progressive troubles  Lab work to be done at [facility] before his wellness exam Patient with some side pain with certain movements but is not persistent or consistent pain Very unlikely that this is his aortic aneurysm leaking Certainly  patient was warned that if he starts having severe abdominal pain severe back pain that is unrelenting he needs immediate evaluation Patient will be having evaluation by his cardiologist and vascular surgeon in the next few weeks and they are planning to do a stent coming up this fall

## 2017-11-09 ENCOUNTER — Ambulatory Visit (INDEPENDENT_AMBULATORY_CARE_PROVIDER_SITE_OTHER): Payer: Medicare Other | Admitting: Cardiovascular Disease

## 2017-11-09 ENCOUNTER — Encounter: Payer: Self-pay | Admitting: Cardiovascular Disease

## 2017-11-09 VITALS — BP 132/78 | HR 89 | Ht 69.0 in | Wt 203.2 lb

## 2017-11-09 DIAGNOSIS — Z01818 Encounter for other preprocedural examination: Secondary | ICD-10-CM

## 2017-11-09 DIAGNOSIS — I714 Abdominal aortic aneurysm, without rupture, unspecified: Secondary | ICD-10-CM

## 2017-11-09 DIAGNOSIS — I480 Paroxysmal atrial fibrillation: Secondary | ICD-10-CM

## 2017-11-09 DIAGNOSIS — E785 Hyperlipidemia, unspecified: Secondary | ICD-10-CM | POA: Diagnosis not present

## 2017-11-09 DIAGNOSIS — Z952 Presence of prosthetic heart valve: Secondary | ICD-10-CM

## 2017-11-09 DIAGNOSIS — I10 Essential (primary) hypertension: Secondary | ICD-10-CM | POA: Diagnosis not present

## 2017-11-09 DIAGNOSIS — Z7189 Other specified counseling: Secondary | ICD-10-CM | POA: Diagnosis not present

## 2017-11-09 DIAGNOSIS — I25708 Atherosclerosis of coronary artery bypass graft(s), unspecified, with other forms of angina pectoris: Secondary | ICD-10-CM

## 2017-11-09 NOTE — Patient Instructions (Signed)

## 2017-11-09 NOTE — Progress Notes (Signed)
SUBJECTIVE: The patient presents for preoperative risk stratification.  He saw vascular surgery in July and an endovascular stent graft repair of his infrarenal abdominal aortic aneurysm is being planned.  He underwent 2 vessel CABG with LIMA to the LAD and SVG to the circumflex on 09/06/16. He also went aortic valve replacement with a 23 mmMagnaEase pericardial tissue valve.  He developed postoperative atrial fibrillation and fluttergoing out to postoperative day12and was started on amiodarone.  Echocardiogram 09/14/16: Vigorous left ventricular systolic function, LVEF 65-70%, normal regional wall motion, normally functioning 23 mmMagnaEase pericardial bioprosthesis. The left atrium and right atrium and right ventricle were mildly dilated.  ECG performed in the office today which I ordered and personally interpreted demonstrates normal sinus rhythm with no ischemic ST segment or T-wave abnormalities, nor any arrhythmias.  He tries to walk 1 mile every morning on the track.  Chronic exertional dyspnea is stable.  Shortness of breath is worse when the temperatures are very hot and humid as they have been.  He has not had to use nitroglycerin recently.  He denies palpitations, orthopnea, and leg swelling.  He denies chest pain.   Review of Systems: As per "subjective", otherwise negative.  Allergies  Allergen Reactions  . Ciprofloxacin     Artificial heart valve    Current Outpatient Medications  Medication Sig Dispense Refill  . acetaminophen (TYLENOL) 500 MG tablet Take 500 mg by mouth every 6 (six) hours as needed.    [name] amoxicillin-clavulanate (AUGMENTIN) 875-125 MG tablet Take 1 tablet by mouth 2 (two) times daily. 20 tablet 0  . apixaban (ELIQUIS) 5 MG TABS tablet Take 1 tablet (5 mg total) by mouth 2 (two) times daily. 180 tablet 3  . aspirin 81 MG tablet Take 81 mg by mouth at bedtime.     [name] atorvastatin (LIPITOR) 80 MG tablet TAKE 1 TABLET DAILY 90 tablet 0  .  diltiazem (CARDIZEM CD) 120 MG 24 hr capsule TAKE 1 CAPSULE BY MOUTH EVERY DAY 30 capsule 6  . ferrous sulfate 325 (65 FE) MG tablet Take 325 mg by mouth daily with breakfast.    . fexofenadine (ALLEGRA) 180 MG tablet Take 180 mg by mouth daily.    . fluticasone (FLONASE) 50 MCG/ACT nasal spray Place 2 sprays into both nostrils daily. 16 g 5  . lisinopril (PRINIVIL,ZESTRIL) 20 MG tablet Take 1 tablet (20 mg total) by mouth daily. 90 tablet 3  . Multiple Vitamin (MULTIVITAMIN) tablet Take 1 tablet by mouth daily.    . [name] ELLIPTA 100-62.5-25 MCG/INH AEPB Inhale 1 puff into the lungs daily.  12  . VENTOLIN HFA 108 (90 Base) MCG/ACT inhaler INHALE 2 PUFFS BY MOUTH EVERY 4 HOURS AS NEEDED 18 g 2  . vitamin C (ASCORBIC ACID) 500 MG tablet Take 500 mg by mouth daily. Reported on 07/16/2015     No current facility-administered medications for this visit.     Past Medical History:  Diagnosis Date  . AAA (abdominal aortic aneurysm) (HCC)   . COPD (chronic obstructive pulmonary disease) (HCC)   . Fatty liver   . Hyperlipidemia   . Hypertension   . Severe aortic stenosis 08/29/2016  . White coat hypertension     Past Surgical History:  Procedure Laterality Date  . ANKLE SURGERY Right 2009  . AORTIC VALVE REPLACEMENT N/A 09/05/2016   Procedure: AORTIC VALVE REPLACEMENT (AVR) USING 23 MM MAGNA EASE PERICARDIAL TISSUE VALVE;  Surgeon: Delight Ovens, MD;  Location: [facility] OR;  Service: Open Heart Surgery;  Laterality: N/A;  . APPENDECTOMY    . CARDIAC CATHETERIZATION N/A 02/02/2016   Procedure: Left Heart Cath and Coronary Angiography;  Surgeon: Rinaldo Cloud, MD;  [facility] INVASIVE CV LAB;  Service: Cardiovascular;  Laterality: N/A;  . COLONOSCOPY  06/22/2010   [id number] rectal and right colon polyps removed remainder rectum and colon appared normal  . COLONOSCOPY WITH PROPOFOL N/A 05/15/2014   Procedure: COLONOSCOPY WITH PROPOFOL;  Surgeon: Corbin Ade, MD;  Location: [facility] ORS;   Service: Endoscopy;  Laterality: N/A;  In cecum @ [id number], out @ 0937, withdrawal time 19 minutes  . CORONARY ARTERY BYPASS GRAFT N/A 09/05/2016   Procedure: CORONARY ARTERY BYPASS GRAFTING (CABG) USING LIMA TO DISTAL LAD AND ENDOSCOPICALLY HARVESTED GREATER SAPHENOUS VEIN TO CIRC.;  Surgeon: Delight Ovens, MD;  Location: [facility] OR;  Service: Open Heart Surgery;  Laterality: N/A;  . ENDOVEIN HARVEST OF GREATER SAPHENOUS VEIN Right 09/05/2016   Procedure: ENDOVEIN HARVEST OF GREATER SAPHENOUS VEIN;  Surgeon: Delight Ovens, MD;  Location: [facility] OR;  Service: Open Heart Surgery;  Laterality: Right;  . POLYPECTOMY N/A 05/15/2014   Procedure: POLYPECTOMY;  Surgeon: Corbin Ade, MD;  Location: [facility] ORS;  Service: Endoscopy;  Laterality: N/A;  . RIGHT/LEFT HEART CATH AND CORONARY ANGIOGRAPHY N/A 08/29/2016   Procedure: Right/Left Heart Cath and Coronary Angiography;  Surgeon: Tonny Bollman, MD;  Location: [facility] INVASIVE CV LAB;  Service: Cardiovascular;  Laterality: N/A;  . TEE WITHOUT CARDIOVERSION N/A 09/05/2016   Procedure: TRANSESOPHAGEAL ECHOCARDIOGRAM (TEE);  Surgeon: Delight Ovens, MD;  Location: [facility] OR;  Service: Open Heart Surgery;  Laterality: N/A;    Social History   Socioeconomic History  . Marital status: Married    Spouse name: Not on file  . Number of children: Not on file  . Years of education: Not on file  . Highest education level: Not on file  Occupational History  . Not on file  Social Needs  . Financial resource strain: Not on file  . Food insecurity:    Worry: Not on file    Inability: Not on file  . Transportation needs:    Medical: Not on file    Non-medical: Not on file  Tobacco Use  . Smoking status: Former Smoker    Packs/day: 1.50    Years: 40.00    Pack years: 60.00    Types: Cigarettes    Last attempt to quit: 04/16/2002    Years since quitting: 15.5  . Smokeless tobacco: Never Used  Substance and Sexual Activity  . Alcohol use: Yes    Alcohol/week: 5.0  standard drinks    Types: 5 Cans of beer per week    Comment: per pt  . Drug use: No  . Sexual activity: Never    Birth control/protection: None  Lifestyle  . Physical activity:    Days per week: Not on file    Minutes per session: Not on file  . Stress: Not on file  Relationships  . Social connections:    Talks on phone: Not on file    Gets together: Not on file    Attends religious service: Not on file    Active member of club or organization: Not on file    Attends meetings of clubs or organizations: Not on file    Relationship status: Not on file  . Intimate partner violence:    Fear of current or ex partner: Not on file    Emotionally abused:  Not on file    Physically abused: Not on file    Forced sexual activity: Not on file  Other Topics Concern  . Not on file  Social History Narrative   [facility] (09/13/16):   Previously worked in a [employer]. Also worked Production designer, theatre/television/film. Currently has an outdoor cat and no indoor pets. No bird or mold exposure.     Vitals:   11/09/17 0952  BP: 132/78  Pulse: 89  SpO2: 91%  Weight: 203 lb 3.2 oz (92.2 kg)  Height: 5\' 9"  (1.753 m)    Wt Readings from Last 3 Encounters:  11/09/17 203 lb 3.2 oz (92.2 kg)  11/01/17 204 lb 0.2 oz (92.5 kg)  08/17/17 205 lb 3.2 oz (93.1 kg)     PHYSICAL EXAM General: NAD HEENT: Normal. Neck: No JVD, no thyromegaly. Lungs: Diminished sounds throughout without crackles or wheezes. CV: Regular rate and rhythm, normal S1/S2, no S3/S4, no murmur. No pretibial or periankle edema.  No carotid bruit.   Abdomen: Soft, nontender, no distention.  Neurologic: Alert and oriented.  Psych: Normal affect. Skin: Normal. Musculoskeletal: No gross deformities.    ECG: Reviewed above under Subjective   Labs: Lab Results  Component Value Date/Time   K 4.9 03/07/2017 07:41 AM   BUN 13 03/07/2017 07:41 AM   BUN 9 05/18/2015 08:34 AM   CREATININE 0.80 08/08/2017 11:04 AM   CREATININE  0.69 (L) 03/07/2017 07:41 AM   ALT 33 03/07/2017 07:41 AM   TSH 1.701 09/12/2016 01:19 PM   HGB 15.3 11/14/2016 08:20 AM   HGB 15.8 06/13/2015 09:06 AM     Lipids: Lab Results  Component Value Date/Time   LDLCALC 61 03/07/2017 07:41 AM   CHOL 129 03/07/2017 07:41 AM   CHOL 128 05/18/2015 08:34 AM   TRIG 89 03/07/2017 07:41 AM   HDL 51 03/07/2017 07:41 AM   HDL 43 05/18/2015 08:34 AM       ASSESSMENT AND PLAN:  1.  Coronary artery disease status post two-vessel CABG: Symptomatically stable.  Continue aspirin and statin.     2.  Status post aortic valve replacement with a bioprosthetic valve: Stable.  Functioning normally by most recent echocardiogram detailed above.  3.  Paroxysmal atrial fibrillation and flutter: Symptomatically stable without palpitations.  As he had postoperative atrial fibrillation out today 12, and due to his concomitant lung disease, I am concerned about the possibility of recurrence of atrial fibrillation.  For this reason, I will continue anticoagulation with apixaban 5 mg twice daily.  Continue long-acting diltiazem.  4.  Hypertension: Controlled.  No changes to therapy.  5.  Hyperlipidemia: I reviewed lipids dated 03/07/2017 which showed total cholesterol 129, HDL 51, triglycerides 89, LDL 61.  Continue atorvastatin 80 mg.  6.  Abdominal aortic aneurysm: Followed by vascular surgery.  5.2 cm in diameter by CT angiography on 08/09/2017.  This represents an increase from 4.7 cm on 04/13/2016.  Endovascular stent graft repair is being planned.  He can proceed without noninvasive testing.  Apixaban can be held 2 days prior to procedure.  7.  Preoperative risk stratification: The patient can proceed with endovascular stent graft repair with an acceptable level of risk.  He does not require noninvasive cardiac testing.   Disposition: Follow up 6 months   Prentice Docker, M.D., F.A.C.C.

## 2017-11-10 ENCOUNTER — Encounter: Payer: Self-pay | Admitting: Vascular Surgery

## 2017-11-13 ENCOUNTER — Telehealth: Payer: Self-pay | Admitting: Family Medicine

## 2017-11-13 ENCOUNTER — Other Ambulatory Visit: Payer: Self-pay

## 2017-11-13 DIAGNOSIS — Z125 Encounter for screening for malignant neoplasm of prostate: Secondary | ICD-10-CM

## 2017-11-13 DIAGNOSIS — R972 Elevated prostate specific antigen [PSA]: Secondary | ICD-10-CM | POA: Diagnosis not present

## 2017-11-13 DIAGNOSIS — Z7901 Long term (current) use of anticoagulants: Secondary | ICD-10-CM

## 2017-11-13 DIAGNOSIS — I159 Secondary hypertension, unspecified: Secondary | ICD-10-CM

## 2017-11-13 DIAGNOSIS — E7849 Other hyperlipidemia: Secondary | ICD-10-CM

## 2017-11-13 DIAGNOSIS — N401 Enlarged prostate with lower urinary tract symptoms: Secondary | ICD-10-CM | POA: Diagnosis not present

## 2017-11-13 NOTE — Telephone Encounter (Signed)
Patient came by the office and picked up the lab orders.

## 2017-11-13 NOTE — Telephone Encounter (Signed)
Pt is at [facility] and needing lab orders faxed over. Please fax to [phone]

## 2017-11-14 LAB — CBC WITH DIFFERENTIAL/PLATELET
Basophils Absolute: 66 cells/uL (ref 0–200)
Basophils Relative: 0.5 %
Eosinophils Absolute: 197 cells/uL (ref 15–500)
Eosinophils Relative: 1.5 %
HCT: 44.4 % (ref 38.5–50.0)
Hemoglobin: 15.2 g/dL (ref 13.2–17.1)
Lymphs Abs: 1677 cells/uL (ref 850–3900)
MCH: 32.5 pg (ref 27.0–33.0)
MCHC: 34.2 g/dL (ref 32.0–36.0)
MCV: 95.1 fL (ref 80.0–100.0)
MPV: 10.9 fL (ref 7.5–12.5)
Monocytes Relative: 10.6 %
Neutro Abs: 9773 cells/uL — ABNORMAL HIGH (ref 1500–7800)
Neutrophils Relative %: 74.6 %
Platelets: 216 10*3/uL (ref 140–400)
RBC: 4.67 10*6/uL (ref 4.20–5.80)
RDW: 12.4 % (ref 11.0–15.0)
Total Lymphocyte: 12.8 %
WBC mixed population: 1389 cells/uL — ABNORMAL HIGH (ref 200–950)
WBC: 13.1 10*3/uL — ABNORMAL HIGH (ref 3.8–10.8)

## 2017-11-14 LAB — LIPID PANEL
Cholesterol: 217 mg/dL — ABNORMAL HIGH (ref <200)
HDL: 43 mg/dL (ref >40)
Non-HDL Cholesterol (Calc): 174 mg/dL (calc) — ABNORMAL HIGH (ref <130)
Total CHOL/HDL Ratio: 5 (calc) — ABNORMAL HIGH (ref <5.0)
Triglycerides: 778 mg/dL — ABNORMAL HIGH (ref <150)

## 2017-11-14 LAB — HEPATIC FUNCTION PANEL
AG Ratio: 1.7 (calc) (ref 1.0–2.5)
ALT: 29 U/L (ref 9–46)
AST: 31 U/L (ref 10–35)
Albumin: 4.2 g/dL (ref 3.6–5.1)
Alkaline phosphatase (APISO): 60 U/L (ref 40–115)
Bilirubin, Direct: 0.1 mg/dL (ref 0.0–0.2)
Globulin: 2.5 g/dL (calc) (ref 1.9–3.7)
Indirect Bilirubin: 0.6 mg/dL (calc) (ref 0.2–1.2)
Total Bilirubin: 0.7 mg/dL (ref 0.2–1.2)
Total Protein: 6.7 g/dL (ref 6.1–8.1)

## 2017-11-14 LAB — BASIC METABOLIC PANEL
BUN/Creatinine Ratio: 19 (calc) (ref 6–22)
BUN: 12 mg/dL (ref 7–25)
CO2: 29 mmol/L (ref 20–32)
Calcium: 9.5 mg/dL (ref 8.6–10.3)
Chloride: 103 mmol/L (ref 98–110)
Creat: 0.64 mg/dL — ABNORMAL LOW (ref 0.70–1.25)
Glucose, Bld: 102 mg/dL — ABNORMAL HIGH (ref 65–99)
Potassium: 4.3 mmol/L (ref 3.5–5.3)
Sodium: 143 mmol/L (ref 135–146)

## 2017-11-14 LAB — PSA: PSA: 1.7 ng/mL (ref <=4.0)

## 2017-11-23 ENCOUNTER — Ambulatory Visit (INDEPENDENT_AMBULATORY_CARE_PROVIDER_SITE_OTHER): Payer: Medicare Other | Admitting: Vascular Surgery

## 2017-11-23 ENCOUNTER — Encounter: Payer: Self-pay | Admitting: Vascular Surgery

## 2017-11-23 ENCOUNTER — Other Ambulatory Visit: Payer: Self-pay | Admitting: *Deleted

## 2017-11-23 ENCOUNTER — Encounter: Payer: Self-pay | Admitting: *Deleted

## 2017-11-23 VITALS — BP 157/97 | HR 86 | Temp 98.2°F | Resp 16 | Ht 69.0 in | Wt 204.0 lb

## 2017-11-23 DIAGNOSIS — I25708 Atherosclerosis of coronary artery bypass graft(s), unspecified, with other forms of angina pectoris: Secondary | ICD-10-CM

## 2017-11-23 DIAGNOSIS — I714 Abdominal aortic aneurysm, without rupture, unspecified: Secondary | ICD-10-CM

## 2017-11-23 NOTE — Progress Notes (Signed)
Patient is a 67-year-old male who returns for follow-up today for known infrarenal abdominal aortic aneurysm.  He was last seen July 2019.  When his aneurysm had grown to 5.2 cm in diameter.  I offered him an endovascular stent graft repair at that point but he wishes to defer this because his wife had an orthopedic procedure in the near future.  He now returns wishing to have the aneurysm repair.  He denies any abdominal or back pain.  He is on Eliquis for paroxysmal atrial fibrillation.  He also had an aortic valve replacement in the past as well as coronary artery bypass grafting.  Past Medical History:  Diagnosis Date  . AAA (abdominal aortic aneurysm) (HCC)   . COPD (chronic obstructive pulmonary disease) (HCC)   . Fatty liver   . Hyperlipidemia   . Hypertension   . Severe aortic stenosis 08/29/2016  . White coat hypertension     Past Surgical History:  Procedure Laterality Date  . ANKLE SURGERY Right 2009  . AORTIC VALVE REPLACEMENT N/A 09/05/2016   Procedure: AORTIC VALVE REPLACEMENT (AVR) USING 23 MM MAGNA EASE PERICARDIAL TISSUE VALVE;  Surgeon: Gerhardt, Edward B, MD;  Location: [facility] OR;  Service: Open Heart Surgery;  Laterality: N/A;  . APPENDECTOMY    . CARDIAC CATHETERIZATION N/A 02/02/2016   Procedure: Left Heart Cath and Coronary Angiography;  Surgeon: Mohan Harwani, MD;  Location: [facility] INVASIVE CV LAB;  Service: Cardiovascular;  Laterality: N/A;  . COLONOSCOPY  06/22/2010   RMR:Diminutive rectal and right colon polyps removed remainder rectum and colon appared normal  . COLONOSCOPY WITH PROPOFOL N/A 05/15/2014   Procedure: COLONOSCOPY WITH PROPOFOL;  Surgeon: Robert M Rourk, MD;  Location: [facility];  Service: Endoscopy;  Laterality: N/A;  In cecum @ 0918, out @ 0937, withdrawal time 19 minutes  . CORONARY ARTERY BYPASS GRAFT N/A 09/05/2016   Procedure: CORONARY ARTERY BYPASS GRAFTING (CABG) USING LIMA TO DISTAL LAD AND ENDOSCOPICALLY HARVESTED GREATER SAPHENOUS VEIN TO CIRC.;  Surgeon:  Gerhardt, Edward B, MD;  Location: [facility] OR;  Service: Open Heart Surgery;  Laterality: N/A;  . ENDOVEIN HARVEST OF GREATER SAPHENOUS VEIN Right 09/05/2016   Procedure: ENDOVEIN HARVEST OF GREATER SAPHENOUS VEIN;  Surgeon: Gerhardt, Edward B, MD;  Location: [facility] OR;  Service: Open Heart Surgery;  Laterality: Right;  . POLYPECTOMY N/A 05/15/2014   Procedure: POLYPECTOMY;  Surgeon: Robert M Rourk, MD;  Location: [facility];  Service: Endoscopy;  Laterality: N/A;  . RIGHT/LEFT HEART CATH AND CORONARY ANGIOGRAPHY N/A 08/29/2016   Procedure: Right/Left Heart Cath and Coronary Angiography;  Surgeon: Cooper, Michael, MD;  Location: [facility] INVASIVE CV LAB;  Service: Cardiovascular;  Laterality: N/A;  . TEE WITHOUT CARDIOVERSION N/A 09/05/2016   Procedure: TRANSESOPHAGEAL ECHOCARDIOGRAM (TEE);  Surgeon: Gerhardt, Edward B, MD;  Location: [facility] OR;  Service: Open Heart Surgery;  Laterality: N/A;   Current Outpatient Medications on File Prior to Visit  Medication Sig Dispense Refill  . acetaminophen (TYLENOL) 500 MG tablet Take 500 mg by mouth every 6 (six) hours as needed.    . apixaban (ELIQUIS) 5 MG TABS tablet Take 1 tablet (5 mg total) by mouth 2 (two) times daily. 180 tablet 3  . aspirin 81 MG tablet Take 81 mg by mouth at bedtime.     . atorvastatin (LIPITOR) 80 MG tablet TAKE 1 TABLET DAILY 90 tablet 0  . diltiazem (CARDIZEM CD) 120 MG 24 hr capsule TAKE 1 CAPSULE BY MOUTH EVERY DAY 30 capsule 6  . ferrous sulfate   325 (65 FE) MG tablet Take 325 mg by mouth daily with breakfast.    . fexofenadine (ALLEGRA) 180 MG tablet Take 180 mg by mouth daily.    . fluticasone (FLONASE) 50 MCG/ACT nasal spray Place 2 sprays into both nostrils daily. 16 g 5  . lisinopril (PRINIVIL,ZESTRIL) 20 MG tablet Take 1 tablet (20 mg total) by mouth daily. 90 tablet 3  . Multiple Vitamin (MULTIVITAMIN) tablet Take 1 tablet by mouth daily.    . [name] ELLIPTA 100-62.5-25 MCG/INH AEPB Inhale 1 puff into the lungs daily.  12  . VENTOLIN HFA 108  (90 Base) MCG/ACT inhaler INHALE 2 PUFFS BY MOUTH EVERY 4 HOURS AS NEEDED 18 g 2  . vitamin C (ASCORBIC ACID) 500 MG tablet Take 500 mg by mouth daily. Reported on 07/16/2015     No current facility-administered medications on file prior to visit.     Allergies  Allergen Reactions  . Ciprofloxacin     Artificial heart valve    Social History   Socioeconomic History  . Marital status: Married    Spouse name: Not on file  . Number of children: Not on file  . Years of education: Not on file  . Highest education level: Not on file  Occupational History  . Not on file  Social Needs  . Financial resource strain: Not on file  . Food insecurity:    Worry: Not on file    Inability: Not on file  . Transportation needs:    Medical: Not on file    Non-medical: Not on file  Tobacco Use  . Smoking status: Former Smoker    Packs/day: 1.50    Years: 40.00    Pack years: 60.00    Types: Cigarettes    Last attempt to quit: 04/16/2002    Years since quitting: 15.6  . Smokeless tobacco: Never Used  Substance and Sexual Activity  . Alcohol use: Yes    Alcohol/week: 5.0 standard drinks    Types: 5 Cans of beer per week    Comment: per pt  . Drug use: No  . Sexual activity: Never    Birth control/protection: None  Lifestyle  . Physical activity:    Days per week: Not on file    Minutes per session: Not on file  . Stress: Not on file  Relationships  . Social connections:    Talks on phone: Not on file    Gets together: Not on file    Attends religious service: Not on file    Active member of club or organization: Not on file    Attends meetings of clubs or organizations: Not on file    Relationship status: Not on file  . Intimate partner violence:    Fear of current or ex partner: Not on file    Emotionally abused: Not on file    Physically abused: Not on file    Forced sexual activity: Not on file  Other Topics Concern  . 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 Currently has an outdoor cat and no indoor pets. No bird or mold exposure.    Family History  Problem Relation Age of Onset  . COPD Mother   . Hypertension Sister   . AAA (abdominal aortic aneurysm) Brother   . Colon cancer Neg Hx   . Liver disease Neg Hx     Review of   systems: He denies chest pain.  He denies shortness of breath.  Physical exam:  Vitals:   11/23/17 0934  BP: (!) 157/97  Pulse: 86  Resp: 16  Temp: 98.2 F (36.8 C)  TempSrc: Oral  SpO2: 95%  Weight: 204 lb (92.5 kg)  Height: 5' 9" (1.753 m)   General alert and oriented no acute distress  HEENT: Normal  Neck: No carotid bruits  Pulmonary: Clear to auscultation bilaterally  Cardiac: Regular rate and rhythm without murmur  Abdomen: Soft nontender nondistended no mass obese  Skin: No rash  Extremities: 2+ brachial radial femoral pulses 2+ dorsalis pedis pulse left side absent pedal pulses right side  Musculoskeletal no deformity or edema  Neurologic: Upper and lower extremity motor 5/5 and symmetric no sensory deficits facial asymmetry  Data: I reviewed the patient's CT angiogram dated August 08, 2017.  These images show the the aneurysm should be amenable to a Gore Excluder stent graft repair.  Assessment: 5.2 cm abdominal aortic aneurysm warranting repair.  Plan: The patient is scheduled for endovascular aneurysm repair with Gore Excluder stent graft December 11, 2017.  Risk benefits possible complications of procedure details were expanded the patient today including not limited to bleeding infection vessel injury myocardial events need for long-term follow-up.  He understands and agrees to proceed.  We will stop his Eliquis 2 days prior to procedure.  Leilynn Pilat, MD [facility] Office: [phone] Pager: [phone]   

## 2017-11-23 NOTE — H&P (View-Only) (Signed)
Patient is a 67 year old male who returns for follow-up today for known infrarenal abdominal aortic aneurysm.  He was last seen July 2019.  When his aneurysm had grown to 5.2 cm in diameter.  I offered him an endovascular stent graft repair at that point but he wishes to defer this because his wife had an orthopedic procedure in the near future.  He now returns wishing to have the aneurysm repair.  He denies any abdominal or back pain.  He is on Eliquis for paroxysmal atrial fibrillation.  He also had an aortic valve replacement in the past as well as coronary artery bypass grafting.  Past Medical History:  Diagnosis Date  . AAA (abdominal aortic aneurysm) (HCC)   . COPD (chronic obstructive pulmonary disease) (HCC)   . Fatty liver   . Hyperlipidemia   . Hypertension   . Severe aortic stenosis 08/29/2016  . White coat hypertension     Past Surgical History:  Procedure Laterality Date  . ANKLE SURGERY Right 2009  . AORTIC VALVE REPLACEMENT N/A 09/05/2016   Procedure: AORTIC VALVE REPLACEMENT (AVR) USING 23 MM MAGNA EASE PERICARDIAL TISSUE VALVE;  Surgeon: Delight Ovens, MD;  Location: [facility] OR;  Service: Open Heart Surgery;  Laterality: N/A;  . APPENDECTOMY    . CARDIAC CATHETERIZATION N/A 02/02/2016   Procedure: Left Heart Cath and Coronary Angiography;  Surgeon: Rinaldo Cloud, MD;  Location: [facility] INVASIVE CV LAB;  Service: Cardiovascular;  Laterality: N/A;  . COLONOSCOPY  06/22/2010   ONG:[id number] rectal and right colon polyps removed remainder rectum and colon appared normal  . COLONOSCOPY WITH PROPOFOL N/A 05/15/2014   Procedure: COLONOSCOPY WITH PROPOFOL;  Surgeon: Corbin Ade, MD;  Location: [facility] ORS;  Service: Endoscopy;  Laterality: N/A;  In cecum @ [id number], out @ 0937, withdrawal time 19 minutes  . CORONARY ARTERY BYPASS GRAFT N/A 09/05/2016   Procedure: CORONARY ARTERY BYPASS GRAFTING (CABG) USING LIMA TO DISTAL LAD AND ENDOSCOPICALLY HARVESTED GREATER SAPHENOUS VEIN TO CIRC.;  Surgeon:  Delight Ovens, MD;  Location: [facility] OR;  Service: Open Heart Surgery;  Laterality: N/A;  . ENDOVEIN HARVEST OF GREATER SAPHENOUS VEIN Right 09/05/2016   Procedure: ENDOVEIN HARVEST OF GREATER SAPHENOUS VEIN;  Surgeon: Delight Ovens, MD;  Location[facility] OR;  Service: Open Heart Surgery;  Laterality: Right;  . POLYPECTOMY N/A 05/15/2014   Procedure: POLYPECTOMY;  Surgeon: Corbin Ade, MD;  Location: [facility] ORS;  Service: Endoscopy;  Laterality: N/A;  . RIGHT/LEFT HEART CATH AND CORONARY ANGIOGRAPHY N/A 08/29/2016   Procedure: Right/Left Heart Cath and Coronary Angiography;  Surgeon: Tonny Bollman, MD;  Location: [facility] INVASIVE CV LAB;  Service: Cardiovascular;  Laterality: N/A;  . TEE WITHOUT CARDIOVERSION N/A 09/05/2016   Procedure: TRANSESOPHAGEAL ECHOCARDIOGRAM (TEE);  Surgeon: Delight Ovens, MD;  Location: [facility] OR;  Service: Open Heart Surgery;  Laterality: N/A;   Current Outpatient Medications on File Prior to Visit  Medication Sig Dispense Refill  . acetaminophen (TYLENOL) 500 MG tablet Take 500 mg by mouth every 6 (six) hours as needed.    [name] apixaban (ELIQUIS) 5 MG TABS tablet Take 1 tablet (5 mg total) by mouth 2 (two) times daily. 180 tablet 3  . aspirin 81 MG tablet Take 81 mg by mouth at bedtime.     [name] atorvastatin (LIPITOR) 80 MG tablet TAKE 1 TABLET DAILY 90 tablet 0  . diltiazem (CARDIZEM CD) 120 MG 24 hr capsule TAKE 1 CAPSULE BY MOUTH EVERY DAY 30 capsule 6  . ferrous sulfate  325 (65 FE) MG tablet Take 325 mg by mouth daily with breakfast.    . fexofenadine (ALLEGRA) 180 MG tablet Take 180 mg by mouth daily.    . fluticasone (FLONASE) 50 MCG/ACT nasal spray Place 2 sprays into both nostrils daily. 16 g 5  . lisinopril (PRINIVIL,ZESTRIL) 20 MG tablet Take 1 tablet (20 mg total) by mouth daily. 90 tablet 3  . Multiple Vitamin (MULTIVITAMIN) tablet Take 1 tablet by mouth daily.    . [name] ELLIPTA 100-62.5-25 MCG/INH AEPB Inhale 1 puff into the lungs daily.  12  . VENTOLIN HFA 108  (90 Base) MCG/ACT inhaler INHALE 2 PUFFS BY MOUTH EVERY 4 HOURS AS NEEDED 18 g 2  . vitamin C (ASCORBIC ACID) 500 MG tablet Take 500 mg by mouth daily. Reported on 07/16/2015     No current facility-administered medications on file prior to visit.     Allergies  Allergen Reactions  . Ciprofloxacin     Artificial heart valve    Social History   Socioeconomic History  . Marital status: Married    Spouse name: Not on file  . Number of children: Not on file  . Years of education: Not on file  . Highest education level: Not on file  Occupational History  . Not on file  Social Needs  . Financial resource strain: Not on file  . Food insecurity:    Worry: Not on file    Inability: Not on file  . Transportation needs:    Medical: Not on file    Non-medical: Not on file  Tobacco Use  . Smoking status: Former Smoker    Packs/day: 1.50    Years: 40.00    Pack years: 60.00    Types: Cigarettes    Last attempt to quit: 04/16/2002    Years since quitting: 15.6  . Smokeless tobacco: Never Used  Substance and Sexual Activity  . Alcohol use: Yes    Alcohol/week: 5.0 standard drinks    Types: 5 Cans of beer per week    Comment: per pt  . Drug use: No  . Sexual activity: Never    Birth control/protection: None  Lifestyle  . Physical activity:    Days per week: Not on file    Minutes per session: Not on file  . Stress: Not on file  Relationships  . Social connections:    Talks on phone: Not on file    Gets together: Not on file    Attends religious service: Not on file    Active member of club or organization: Not on file    Attends meetings of clubs or organizations: Not on file    Relationship status: Not on file  . Intimate partner violence:    Fear of current or ex partner: Not on file    Emotionally abused: Not on file    Physically abused: Not on file    Forced sexual activity: Not on file  Other Topics Concern  . Not on file  Social History Narrative   [place]  Pulmonary (09/13/16):   Previously worked in a Tax inspector. Also worked Production designer, theatre/television/film. Currently has an outdoor cat and no indoor pets. No bird or mold exposure.    Family History  Problem Relation Age of Onset  . COPD Mother   . Hypertension Sister   . AAA (abdominal aortic aneurysm) Brother   . Colon cancer Neg Hx   . Liver disease Neg Hx     Review of  systems: He denies chest pain.  He denies shortness of breath.  Physical exam:  Vitals:   11/23/17 0934  BP: (!) 157/97  Pulse: 86  Resp: 16  Temp: 98.2 F (36.8 C)  TempSrc: Oral  SpO2: 95%  Weight: 204 lb (92.5 kg)  Height: 5\' 9"  (1.753 m)   General alert and oriented no acute distress  HEENT: Normal  Neck: No carotid bruits  Pulmonary: Clear to auscultation bilaterally  Cardiac: Regular rate and rhythm without murmur  Abdomen: Soft nontender nondistended no mass obese  Skin: No rash  Extremities: 2+ brachial radial femoral pulses 2+ dorsalis pedis pulse left side absent pedal pulses right side  Musculoskeletal no deformity or edema  Neurologic: Upper and lower extremity motor 5/5 and symmetric no sensory deficits facial asymmetry  Data: I reviewed the patient's CT angiogram dated August 08, 2017.  These images show the the aneurysm should be amenable to a Gore Excluder stent graft repair.  Assessment: 5.2 cm abdominal aortic aneurysm warranting repair.  Plan: The patient is scheduled for endovascular aneurysm repair with Gore Excluder stent graft December 11, 2017.  Risk benefits possible complications of procedure details were expanded the patient today including not limited to bleeding infection vessel injury myocardial events need for long-term follow-up.  He understands and agrees to proceed.  We will stop his Eliquis 2 days prior to procedure.  Fabienne Bruns, MD [facility] Office: [phone] Pager: [phone]

## 2017-12-04 ENCOUNTER — Ambulatory Visit (INDEPENDENT_AMBULATORY_CARE_PROVIDER_SITE_OTHER): Payer: Medicare Other | Admitting: Family Medicine

## 2017-12-04 ENCOUNTER — Encounter: Payer: Self-pay | Admitting: Family Medicine

## 2017-12-04 VITALS — BP 124/74 | Ht 69.0 in | Wt 207.6 lb

## 2017-12-04 DIAGNOSIS — D72829 Elevated white blood cell count, unspecified: Secondary | ICD-10-CM

## 2017-12-04 DIAGNOSIS — I10 Essential (primary) hypertension: Secondary | ICD-10-CM

## 2017-12-04 DIAGNOSIS — Z23 Encounter for immunization: Secondary | ICD-10-CM

## 2017-12-04 DIAGNOSIS — E781 Pure hyperglyceridemia: Secondary | ICD-10-CM

## 2017-12-04 DIAGNOSIS — Z0000 Encounter for general adult medical examination without abnormal findings: Secondary | ICD-10-CM | POA: Diagnosis not present

## 2017-12-04 DIAGNOSIS — E7849 Other hyperlipidemia: Secondary | ICD-10-CM

## 2017-12-04 NOTE — Progress Notes (Addendum)
Subjective:    Patient ID: [name], male    DOB: [birth year], 67 y.o.   MRN: [id number]  HPI Pt here today for 6 month follow up.   Here today multiple issues  The patient comes in today for a wellness visit.    Office Visit from 12/04/2017 in [facility]  PHQ-2 Total Score  0       A review of their health history was completed.  A review of medications was also completed.  Any needed refills; refills were reviewed today  Eating habits: Patient states recently has not been eating well mainly because his wife had back surgery and she did most of the cooking therefore he is eating out a lot more he is also eating ice cream on a regular basis  Falls/  MVA accidents in past few months: He denies any falls or injury denies being depressed  Regular exercise: He does do some walking but he is limited how much he can tolerate because of his COPD  Specialist pt sees on regular basis: He does see heart specialist He does have upcoming surgery on aortic aneurysm  Preventative health issues were discussed.   Additional concerns: None currently   Patient was advised yearly wellness exam  Patient for blood pressure check up.  The patient does have hypertension.  The patient is on medication.  Patient relates compliance with meds. Todays BP reviewed with the patient. Patient denies issues with medication. Patient relates reasonable diet. Patient tries to minimize salt. Patient aware of BP goals.  Patient here for follow-up regarding cholesterol.  The patient does have hyperlipidemia.  Patient does try to maintain a reasonable diet.  Patient does take the medication on a regular basis.  Denies missing a dose.  The patient denies any obvious side effects.  Prior blood work results reviewed with the patient.  The patient is aware of his cholesterol goals and the need to keep it under good control to lessen the risk of disease.  Patient recently on prednisone  triglycerides very high white count elevated could have been due to the prednisone we need to read look at that healthy diet recommended patient does have a history of elevated triglycerides  Review of Systems  Constitutional: Negative for activity change, appetite change and fever.  HENT: Negative for congestion and rhinorrhea.   Eyes: Negative for discharge.  Respiratory: Negative for cough and wheezing.   Cardiovascular: Negative for chest pain.  Gastrointestinal: Negative for abdominal pain, blood in stool and vomiting.  Genitourinary: Negative for difficulty urinating and frequency.  Musculoskeletal: Negative for neck pain.  Skin: Negative for rash.  Allergic/Immunologic: Negative for environmental allergies and food allergies.  Neurological: Negative for weakness and headaches.  Psychiatric/Behavioral: Negative for agitation.       Objective:   Physical Exam  Constitutional: He appears well-developed and well-nourished.  HENT:  Head: Normocephalic and atraumatic.  Right Ear: External ear normal.  Left Ear: External ear normal.  Nose: Nose normal.  Mouth/Throat: Oropharynx is clear and moist.  Eyes: Pupils are equal, round, and reactive to light. EOM are normal.  Neck: Normal range of motion. Neck supple. No thyromegaly present.  Cardiovascular: Normal rate, regular rhythm and normal heart sounds.  No murmur heard. Pulmonary/Chest: Effort normal and breath sounds normal. No respiratory distress. He has no wheezes.  Abdominal: Soft. Bowel sounds are normal. He exhibits no distension and no mass. There is no tenderness.  Genitourinary: Penis normal.  Musculoskeletal: Normal range  of motion. He exhibits no edema.  Lymphadenopathy:    He has no cervical adenopathy.  Neurological: He is alert. He exhibits normal muscle tone.  Skin: Skin is warm and dry. No erythema.  Psychiatric: He has a normal mood and affect. His behavior is normal. Judgment normal.   Prostate exam  normal       Assessment & Plan:  Adult wellness-complete.wellness physical was conducted today. Importance of diet and exercise were discussed in detail.  In addition to this a discussion regarding safety was also covered. We also reviewed over immunizations and gave recommendations regarding current immunization needed for age.  In addition to this additional areas were also touched on including: Preventative health exams needed:  Colonoscopy up-to-date colonoscopy  Patient was advised yearly wellness exam  HTN- Patient was seen today as part of a visit regarding hypertension. The importance of healthy diet and regular physical activity was discussed. The importance of compliance with medications discussed.  Ideal goal is to keep blood pressure low elevated levels certainly below 140/90 when possible.  The patient was counseled that keeping blood pressure under control lessen his risk of complications.  The importance of regular follow-ups was discussed with the patient.  Low-salt diet such as DASH recommended.  Regular physical activity was recommended as well.  Patient was advised to keep regular follow-ups.  The patient was seen today as part of an evaluation regarding hyperlipidemia.  Recent lab work has been reviewed with the patient as well as the goals for good cholesterol care.  In addition to this medications have been discussed the importance of compliance with diet and medications discussed as well.  Finally the patient is aware that poor control of cholesterol, noncompliance can dramatically increase the risk of complications. The patient will keep regular office visits and the patient does agreed to periodic lab work.  COPD stable on current medications  Leukocytosis probably related to recent prednisone repeated again in several weeks time  Elevated triglycerides minimize starches stay away from ice cream majority of the time recheck lab work in 8 to 12  weeks Recommend follow-up in 6 months follow-up sooner  Patient having aorta repair next week should do well with this hopefully

## 2017-12-05 NOTE — Pre-Procedure Instructions (Signed)
MADSEN RIDDLE  12/05/2017      [facility] - [place], [place] - [place], [place] - [address] [place] [address] Phone: [phone] Fax: [phone]  [facility] - [place], [place] - [address] AT Portal to Registered Caremark Sites [address] [place] [address] Phone: [phone] Fax: [phone]    Your procedure is scheduled on November 4th.  Report to [facility] Admitting at 0530 A.M.  Call this number if you have problems the morning of surgery:  [phone]   Remember:  Do not eat or drink after midnight.    Take these medicines the morning of surgery with A SIP OF WATER   Tylenol (if needed)  atorvastatin (LIPITOR)  diltiazem (CARDIZEM CD)  fexofenadine (ALLEGRA)  [name] ELLIPTA (if needed)  VENTOLIN HFA (if needed) Bring Inhaler with you.  Follow your Surgeon's instructions on when to stop/resume your Eliquis. If no instructions were given to you, you need to call your Surgeon's office.   7 days prior to surgery STOP taking any Aspirin(unless otherwise instructed by your surgeon), Aleve, Naproxen, Ibuprofen, Motrin, Advil, Goody's, BC's, all herbal medications, fish oil, and all vitamins     Do not wear jewelry.  Do not wear lotions, powders, or perfumes, or deodorant.  Men may shave face and neck.  Do not bring valuables to the hospital.  [facility] is not responsible for any belongings or valuables.  Contacts, dentures or bridgework may not be worn into surgery.  Leave your suitcase in the car.  After surgery it may be brought to your room.  For patients admitted to the hospital, discharge time will be determined by your treatment team.  Patients discharged the day of surgery will not be allowed to drive home.    [place]- Preparing For Surgery  Before surgery, you can play an important role. Because skin is not sterile, your skin needs to be as free of germs as possible.  You can reduce the number of germs on your skin by washing with CHG (chlorahexidine gluconate) Soap before surgery.  CHG is an antiseptic cleaner which kills germs and bonds with the skin to continue killing germs even after washing.    Oral Hygiene is also important to reduce your risk of infection.  Remember - BRUSH YOUR TEETH THE MORNING OF SURGERY WITH YOUR REGULAR TOOTHPASTE  Please do not use if you have an allergy to CHG or antibacterial soaps. If your skin becomes reddened/irritated stop using the CHG.  Do not shave (including legs and underarms) for at least 48 hours prior to first CHG shower. It is OK to shave your face.  Please follow these instructions carefully.   1. Shower the NIGHT BEFORE SURGERY and the MORNING OF SURGERY with CHG.   2. If you chose to wash your hair, wash your hair first as usual with your normal shampoo.  3. After you shampoo, rinse your hair and body thoroughly to remove the shampoo.  4. Use CHG as you would any other liquid soap. You can apply CHG directly to the skin and wash gently with a scrungie or a clean washcloth.   5. Apply the CHG Soap to your body ONLY FROM THE NECK DOWN.  Do not use on open wounds or open sores. Avoid contact with your eyes, ears, mouth and genitals (private parts). Wash Face and genitals (private parts)  with your normal soap.  6.  Wash thoroughly, paying special attention to the area where your surgery will be performed.  7. Thoroughly rinse your body with warm water from the neck down.  8. DO NOT shower/wash with your normal soap after using and rinsing off the CHG Soap.  9. Pat yourself dry with a CLEAN TOWEL.  10. Wear CLEAN PAJAMAS to bed the night before surgery, wear comfortable clothes the morning of surgery  11. Place CLEAN SHEETS on your bed the night of your first shower and DO NOT SLEEP WITH PETS.    Day of Surgery:  Do not apply any deodorants/lotions.  Please wear clean clothes to the hospital/surgery  center.   Remember to brush your teeth WITH YOUR REGULAR TOOTHPASTE.    Please read over the following fact sheets that you were given.

## 2017-12-05 NOTE — Pre-Procedure Instructions (Signed)
REAGEN HABERMAN  12/05/2017      [facility] - [place], [place] - [place], [place] - [address] [place] [address] Phone: [phone] Fax: [phone]  [facility] - [place], [place] - [address] AT Portal to Registered Caremark Sites [address] [place] [address] Phone: [phone] Fax: [phone]    Your procedure is scheduled on November 4th.  Report to [facility] Admitting at 0530 A.M.  Call this number if you have problems the morning of surgery:  [phone]   Remember:  Do not eat or drink after midnight.    Take these medicines the morning of surgery with A SIP OF WATER   Tylenol (if needed)  diltiazem (CARDIZEM CD)  fexofenadine (ALLEGRA)  [name] ELLIPTA (if needed)  VENTOLIN HFA (if needed) Bring Inhaler with you.  Follow your Surgeon's instructions on when to stop/resume your Eliquis. If no instructions were given to you, you need to call your Surgeon's office.   7 days prior to surgery STOP taking any Aspirin(unless otherwise instructed by your surgeon), Aleve, Naproxen, Ibuprofen, Motrin, Advil, Goody's, BC's, all herbal medications, fish oil, and all vitamins     Do not wear jewelry.  Do not wear lotions, powders, or perfumes, or deodorant.  Men may shave face and neck.  Do not bring valuables to the hospital.  [facility] is not responsible for any belongings or valuables.  Contacts, dentures or bridgework may not be worn into surgery.  Leave your suitcase in the car.  After surgery it may be brought to your room.  For patients admitted to the hospital, discharge time will be determined by your treatment team.  Patients discharged the day of surgery will not be allowed to drive home.    [place]- Preparing For Surgery  Before surgery, you can play an important role. Because skin is not sterile, your skin needs to be as free of germs as possible. You can reduce the number  of germs on your skin by washing with CHG (chlorahexidine gluconate) Soap before surgery.  CHG is an antiseptic cleaner which kills germs and bonds with the skin to continue killing germs even after washing.    Oral Hygiene is also important to reduce your risk of infection.  Remember - BRUSH YOUR TEETH THE MORNING OF SURGERY WITH YOUR REGULAR TOOTHPASTE  Please do not use if you have an allergy to CHG or antibacterial soaps. If your skin becomes reddened/irritated stop using the CHG.  Do not shave (including legs and underarms) for at least 48 hours prior to first CHG shower. It is OK to shave your face.  Please follow these instructions carefully.   1. Shower the NIGHT BEFORE SURGERY and the MORNING OF SURGERY with CHG.   2. If you chose to wash your hair, wash your hair first as usual with your normal shampoo.  3. After you shampoo, rinse your hair and body thoroughly to remove the shampoo.  4. Use CHG as you would any other liquid soap. You can apply CHG directly to the skin and wash gently with a scrungie or a clean washcloth.   5. Apply the CHG Soap to your body ONLY FROM THE NECK DOWN.  Do not use on open wounds or open sores. Avoid contact with your eyes, ears, mouth and genitals (private parts). Wash Face and genitals (private parts)  with your normal soap.  6. Wash thoroughly, paying  special attention to the area where your surgery will be performed.  7. Thoroughly rinse your body with warm water from the neck down.  8. DO NOT shower/wash with your normal soap after using and rinsing off the CHG Soap.  9. Pat yourself dry with a CLEAN TOWEL.  10. Wear CLEAN PAJAMAS to bed the night before surgery, wear comfortable clothes the morning of surgery  11. Place CLEAN SHEETS on your bed the night of your first shower and DO NOT SLEEP WITH PETS.    Day of Surgery:  Do not apply any deodorants/lotions.  Please wear clean clothes to the hospital/surgery center.   Remember to  brush your teeth WITH YOUR REGULAR TOOTHPASTE.    Please read over the following fact sheets that you were given.

## 2017-12-06 ENCOUNTER — Encounter (HOSPITAL_COMMUNITY)
Admission: RE | Admit: 2017-12-06 | Discharge: 2017-12-06 | Disposition: A | Discharge status: Home / Self Care | Payer: Medicare Other | Source: Ambulatory Visit | Attending: Vascular Surgery | Admitting: Vascular Surgery

## 2017-12-06 ENCOUNTER — Encounter (HOSPITAL_COMMUNITY): Payer: Self-pay

## 2017-12-06 DIAGNOSIS — Z951 Presence of aortocoronary bypass graft: Secondary | ICD-10-CM | POA: Diagnosis not present

## 2017-12-06 DIAGNOSIS — Z01812 Encounter for preprocedural laboratory examination: Secondary | ICD-10-CM | POA: Diagnosis not present

## 2017-12-06 DIAGNOSIS — I2510 Atherosclerotic heart disease of native coronary artery without angina pectoris: Secondary | ICD-10-CM | POA: Insufficient documentation

## 2017-12-06 DIAGNOSIS — Z87891 Personal history of nicotine dependence: Secondary | ICD-10-CM | POA: Diagnosis not present

## 2017-12-06 DIAGNOSIS — I252 Old myocardial infarction: Secondary | ICD-10-CM | POA: Diagnosis not present

## 2017-12-06 DIAGNOSIS — I10 Essential (primary) hypertension: Secondary | ICD-10-CM | POA: Insufficient documentation

## 2017-12-06 DIAGNOSIS — I493 Ventricular premature depolarization: Secondary | ICD-10-CM | POA: Diagnosis not present

## 2017-12-06 HISTORY — DX: Unspecified osteoarthritis, unspecified site: M19.90

## 2017-12-06 HISTORY — DX: Atherosclerotic heart disease of native coronary artery without angina pectoris: I25.10

## 2017-12-06 HISTORY — DX: Dyspnea, unspecified: R06.00

## 2017-12-06 HISTORY — DX: Acute myocardial infarction, unspecified: I21.9

## 2017-12-06 LAB — CBC
HCT: 49 % (ref 39.0–52.0)
Hemoglobin: 15.7 g/dL (ref 13.0–17.0)
MCH: 31.9 pg (ref 26.0–34.0)
MCHC: 32 g/dL (ref 30.0–36.0)
MCV: 99.6 fL (ref 80.0–100.0)
Platelets: 240 10*3/uL (ref 150–400)
RBC: 4.92 MIL/uL (ref 4.22–5.81)
RDW: 11.9 % (ref 11.5–15.5)
WBC: 6.3 10*3/uL (ref 4.0–10.5)
nRBC: 0 % (ref 0.0–0.2)

## 2017-12-06 LAB — PROTIME-INR
INR: 1.23
Prothrombin Time: 15.4 seconds — ABNORMAL HIGH (ref 11.4–15.2)

## 2017-12-06 LAB — URINALYSIS, ROUTINE W REFLEX MICROSCOPIC
Bilirubin Urine: NEGATIVE
Glucose, UA: NEGATIVE mg/dL
Hgb urine dipstick: NEGATIVE
Ketones, ur: NEGATIVE mg/dL
Leukocytes, UA: NEGATIVE
Nitrite: NEGATIVE
Protein, ur: NEGATIVE mg/dL
Specific Gravity, Urine: 1.003 — ABNORMAL LOW (ref 1.005–1.030)
pH: 6 (ref 5.0–8.0)

## 2017-12-06 LAB — COMPREHENSIVE METABOLIC PANEL
ALT: 31 U/L (ref 0–44)
AST: 43 U/L — ABNORMAL HIGH (ref 15–41)
Albumin: 3.8 g/dL (ref 3.5–5.0)
Alkaline Phosphatase: 58 U/L (ref 38–126)
Anion gap: 9 (ref 5–15)
BUN: 7 mg/dL — ABNORMAL LOW (ref 8–23)
CO2: 25 mmol/L (ref 22–32)
Calcium: 9.4 mg/dL (ref 8.9–10.3)
Chloride: 104 mmol/L (ref 98–111)
Creatinine, Ser: 0.66 mg/dL (ref 0.61–1.24)
GFR calc Af Amer: >60 mL/min (ref >60)
GFR calc non Af Amer: >60 mL/min (ref >60)
Glucose, Bld: 88 mg/dL (ref 70–99)
Potassium: 4.3 mmol/L (ref 3.5–5.1)
Sodium: 138 mmol/L (ref 135–145)
Total Bilirubin: 0.6 mg/dL (ref 0.3–1.2)
Total Protein: 7 g/dL (ref 6.5–8.1)

## 2017-12-06 LAB — TYPE AND SCREEN
ABO/RH(D): A POS
Antibody Screen: NEGATIVE

## 2017-12-06 LAB — APTT: aPTT: 31 seconds (ref 24–36)

## 2017-12-06 LAB — SURGICAL PCR SCREEN
MRSA, PCR: NEGATIVE
Staphylococcus aureus: POSITIVE — AB

## 2017-12-06 NOTE — Progress Notes (Addendum)
PCP: scott luking  Cardiologist: [place]  Pt. Was told last dose of  eliquis Fri. 11/1  Scrip for mupirocin called to [facility].

## 2017-12-07 NOTE — Anesthesia Preprocedure Evaluation (Addendum)
Anesthesia Evaluation  Patient identified by MRN, date of birth, ID band Patient awake    Reviewed: Allergy & Precautions, NPO status , Patient's Chart, lab work & pertinent test results  Airway Mallampati: II  TM Distance: >3 FB Neck ROM: Full    Dental no notable dental hx.    Pulmonary neg pulmonary ROS, former smoker,    Pulmonary exam normal breath sounds clear to auscultation       Cardiovascular hypertension, + CAD, + Past MI, + CABG and + Peripheral Vascular Disease  Normal cardiovascular exam Rhythm:Regular Rate:Normal  S/P AVR   Neuro/Psych negative neurological ROS  negative psych ROS   GI/Hepatic negative GI ROS, Neg liver ROS,   Endo/Other  negative endocrine ROS  Renal/GU negative Renal ROS  negative genitourinary   Musculoskeletal negative musculoskeletal ROS (+)   Abdominal   Peds negative pediatric ROS (+)  Hematology negative hematology ROS (+)   Anesthesia Other Findings   Reproductive/Obstetrics negative OB ROS                            Anesthesia Physical Anesthesia Plan  ASA: III  Anesthesia Plan: General   Post-op Pain Management:    Induction: Intravenous  PONV Risk Score and Plan: 2 and Ondansetron, Dexamethasone and Treatment may vary due to age or medical condition  Airway Management Planned: Oral ETT  Additional Equipment: Arterial line  Intra-op Plan:   Post-operative Plan: Possible Post-op intubation/ventilation  Informed Consent: I have reviewed the patients History and Physical, chart, labs and discussed the procedure including the risks, benefits and alternatives for the proposed anesthesia with the patient or authorized representative who has indicated his/her understanding and acceptance.   Dental advisory given  Plan Discussed with: CRNA and Surgeon  Anesthesia Plan Comments: (S/p CABG and AVR 09/06/16. Follows with Dr. Purvis Sheffield. He  cleared pt 11/09/2017 stating "The patient can proceed with endovascular stent graft repair with an acceptable level of risk.  He does not require noninvasive cardiac testing.")       Anesthesia Quick Evaluation

## 2017-12-11 ENCOUNTER — Other Ambulatory Visit: Payer: Self-pay

## 2017-12-11 ENCOUNTER — Inpatient Hospital Stay (HOSPITAL_COMMUNITY): Payer: Medicare Other

## 2017-12-11 ENCOUNTER — Inpatient Hospital Stay (HOSPITAL_COMMUNITY)
Admission: RE | Admit: 2017-12-11 | Discharge: 2017-12-13 | DRG: 269 | Disposition: A | Discharge status: Home Health | Payer: Medicare Other | Source: Ambulatory Visit | Attending: Vascular Surgery | Admitting: Vascular Surgery

## 2017-12-11 ENCOUNTER — Inpatient Hospital Stay (HOSPITAL_COMMUNITY): Payer: Medicare Other | Admitting: Certified Registered Nurse Anesthetist

## 2017-12-11 ENCOUNTER — Inpatient Hospital Stay (HOSPITAL_COMMUNITY): Payer: Medicare Other | Admitting: Physician Assistant

## 2017-12-11 ENCOUNTER — Encounter (HOSPITAL_COMMUNITY)
Admission: RE | Disposition: A | Discharge status: Home Health | Payer: Self-pay | Source: Ambulatory Visit | Attending: Vascular Surgery

## 2017-12-11 ENCOUNTER — Encounter (HOSPITAL_COMMUNITY): Payer: Self-pay

## 2017-12-11 DIAGNOSIS — Z952 Presence of prosthetic heart valve: Secondary | ICD-10-CM | POA: Diagnosis not present

## 2017-12-11 DIAGNOSIS — Z79899 Other long term (current) drug therapy: Secondary | ICD-10-CM | POA: Diagnosis not present

## 2017-12-11 DIAGNOSIS — I2510 Atherosclerotic heart disease of native coronary artery without angina pectoris: Secondary | ICD-10-CM | POA: Diagnosis present

## 2017-12-11 DIAGNOSIS — R202 Paresthesia of skin: Secondary | ICD-10-CM | POA: Diagnosis not present

## 2017-12-11 DIAGNOSIS — I480 Paroxysmal atrial fibrillation: Secondary | ICD-10-CM | POA: Diagnosis not present

## 2017-12-11 DIAGNOSIS — G629 Polyneuropathy, unspecified: Secondary | ICD-10-CM | POA: Diagnosis not present

## 2017-12-11 DIAGNOSIS — Z87891 Personal history of nicotine dependence: Secondary | ICD-10-CM | POA: Diagnosis not present

## 2017-12-11 DIAGNOSIS — E785 Hyperlipidemia, unspecified: Secondary | ICD-10-CM | POA: Diagnosis present

## 2017-12-11 DIAGNOSIS — Z7982 Long term (current) use of aspirin: Secondary | ICD-10-CM

## 2017-12-11 DIAGNOSIS — Z8679 Personal history of other diseases of the circulatory system: Secondary | ICD-10-CM

## 2017-12-11 DIAGNOSIS — I10 Essential (primary) hypertension: Secondary | ICD-10-CM | POA: Diagnosis not present

## 2017-12-11 DIAGNOSIS — I714 Abdominal aortic aneurysm, without rupture: Secondary | ICD-10-CM | POA: Diagnosis not present

## 2017-12-11 DIAGNOSIS — I7789 Other specified disorders of arteries and arterioles: Secondary | ICD-10-CM | POA: Diagnosis present

## 2017-12-11 DIAGNOSIS — J9811 Atelectasis: Secondary | ICD-10-CM | POA: Diagnosis not present

## 2017-12-11 DIAGNOSIS — J449 Chronic obstructive pulmonary disease, unspecified: Secondary | ICD-10-CM | POA: Diagnosis not present

## 2017-12-11 DIAGNOSIS — Z951 Presence of aortocoronary bypass graft: Secondary | ICD-10-CM | POA: Diagnosis not present

## 2017-12-11 DIAGNOSIS — I4891 Unspecified atrial fibrillation: Secondary | ICD-10-CM | POA: Diagnosis not present

## 2017-12-11 DIAGNOSIS — Z881 Allergy status to other antibiotic agents status: Secondary | ICD-10-CM

## 2017-12-11 DIAGNOSIS — Z7901 Long term (current) use of anticoagulants: Secondary | ICD-10-CM

## 2017-12-11 DIAGNOSIS — I252 Old myocardial infarction: Secondary | ICD-10-CM | POA: Diagnosis not present

## 2017-12-11 DIAGNOSIS — Z95828 Presence of other vascular implants and grafts: Secondary | ICD-10-CM

## 2017-12-11 HISTORY — PX: ABDOMINAL AORTIC ENDOVASCULAR STENT GRAFT: SHX5707

## 2017-12-11 LAB — BASIC METABOLIC PANEL
Anion gap: 6 (ref 5–15)
BUN: 12 mg/dL (ref 8–23)
CO2: 24 mmol/L (ref 22–32)
Calcium: 8.2 mg/dL — ABNORMAL LOW (ref 8.9–10.3)
Chloride: 110 mmol/L (ref 98–111)
Creatinine, Ser: 0.73 mg/dL (ref 0.61–1.24)
GFR calc Af Amer: >60 mL/min (ref >60)
GFR calc non Af Amer: >60 mL/min (ref >60)
Glucose, Bld: 161 mg/dL — ABNORMAL HIGH (ref 70–99)
Potassium: 4.8 mmol/L (ref 3.5–5.1)
Sodium: 140 mmol/L (ref 135–145)

## 2017-12-11 LAB — PROTIME-INR
INR: 1.01
INR: 1.14
Prothrombin Time: 13.2 seconds (ref 11.4–15.2)
Prothrombin Time: 14.5 seconds (ref 11.4–15.2)

## 2017-12-11 LAB — CBC
HCT: 40.3 % (ref 39.0–52.0)
Hemoglobin: 13 g/dL (ref 13.0–17.0)
MCH: 31.6 pg (ref 26.0–34.0)
MCHC: 32.3 g/dL (ref 30.0–36.0)
MCV: 98.1 fL (ref 80.0–100.0)
Platelets: 172 10*3/uL (ref 150–400)
RBC: 4.11 MIL/uL — ABNORMAL LOW (ref 4.22–5.81)
RDW: 12.1 % (ref 11.5–15.5)
WBC: 15.5 10*3/uL — ABNORMAL HIGH (ref 4.0–10.5)
nRBC: 0 % (ref 0.0–0.2)

## 2017-12-11 LAB — MAGNESIUM: Magnesium: 1.5 mg/dL — ABNORMAL LOW (ref 1.7–2.4)

## 2017-12-11 LAB — APTT: aPTT: 32 seconds (ref 24–36)

## 2017-12-11 SURGERY — INSERTION, ENDOVASCULAR STENT GRAFT, AORTA, ABDOMINAL
Anesthesia: General

## 2017-12-11 MED ORDER — HYDRALAZINE HCL 20 MG/ML IJ SOLN: 5.0000 mg | INTRAMUSCULAR | Status: DC | PRN

## 2017-12-11 MED ORDER — PHENYLEPHRINE 40 MCG/ML (10ML) SYRINGE FOR IV PUSH (FOR BLOOD PRESSURE SUPPORT)
PREFILLED_SYRINGE | INTRAVENOUS | Status: AC
  Filled 2017-12-11: qty 10

## 2017-12-11 MED ORDER — FENTANYL CITRATE (PF) 250 MCG/5ML IJ SOLN
INTRAMUSCULAR | Status: DC | PRN
  Administered 2017-12-11: 50 ug via INTRAVENOUS
  Administered 2017-12-11: 100 ug via INTRAVENOUS
  Administered 2017-12-11 (×2): 50 ug via INTRAVENOUS

## 2017-12-11 MED ORDER — ALBUTEROL SULFATE (2.5 MG/3ML) 0.083% IN NEBU: 2.5000 mg | INHALATION_SOLUTION | RESPIRATORY_TRACT | Status: DC | PRN

## 2017-12-11 MED ORDER — MORPHINE SULFATE (PF) 2 MG/ML IV SOLN: 2.0000 mg | INTRAVENOUS | Status: DC | PRN

## 2017-12-11 MED ORDER — GUAIFENESIN-DM 100-10 MG/5ML PO SYRP: 15.0000 mL | ORAL_SOLUTION | ORAL | Status: DC | PRN

## 2017-12-11 MED ORDER — PROTAMINE SULFATE 10 MG/ML IV SOLN
INTRAVENOUS | Status: AC
  Filled 2017-12-11: qty 5

## 2017-12-11 MED ORDER — ONDANSETRON HCL 4 MG/2ML IJ SOLN
INTRAMUSCULAR | Status: AC
  Filled 2017-12-11: qty 2

## 2017-12-11 MED ORDER — LACTATED RINGERS IV SOLN
INTRAVENOUS | Status: DC | PRN
  Administered 2017-12-11: 07:00:00 via INTRAVENOUS

## 2017-12-11 MED ORDER — PROPOFOL 10 MG/ML IV BOLUS
INTRAVENOUS | Status: DC | PRN
  Administered 2017-12-11: 50 mg via INTRAVENOUS
  Administered 2017-12-11: 150 mg via INTRAVENOUS

## 2017-12-11 MED ORDER — ASPIRIN EC 81 MG PO TBEC
81.0000 mg | DELAYED_RELEASE_TABLET | Freq: Every day | ORAL | Status: DC
  Administered 2017-12-12: 81 mg via ORAL
  Filled 2017-12-11: qty 1

## 2017-12-11 MED ORDER — ROCURONIUM BROMIDE 50 MG/5ML IV SOSY
PREFILLED_SYRINGE | INTRAVENOUS | Status: AC
  Filled 2017-12-11: qty 25

## 2017-12-11 MED ORDER — BISACODYL 10 MG RE SUPP: 10.0000 mg | Freq: Every day | RECTAL | Status: DC | PRN

## 2017-12-11 MED ORDER — ALBUMIN HUMAN 5 % IV SOLN
INTRAVENOUS | Status: DC | PRN
  Administered 2017-12-11 (×2): via INTRAVENOUS

## 2017-12-11 MED ORDER — METOPROLOL TARTRATE 5 MG/5ML IV SOLN
2.0000 mg | INTRAVENOUS | Status: DC | PRN
  Administered 2017-12-11: 3 mg via INTRAVENOUS

## 2017-12-11 MED ORDER — EPHEDRINE 5 MG/ML INJ
INTRAVENOUS | Status: AC
  Filled 2017-12-11: qty 10

## 2017-12-11 MED ORDER — FENTANYL CITRATE (PF) 100 MCG/2ML IJ SOLN: 25.0000 ug | INTRAMUSCULAR | Status: DC | PRN

## 2017-12-11 MED ORDER — SODIUM CHLORIDE 0.9 % IV SOLN
INTRAVENOUS | Status: AC
  Filled 2017-12-11: qty 1.2

## 2017-12-11 MED ORDER — CEFAZOLIN SODIUM-DEXTROSE 2-4 GM/100ML-% IV SOLN
2.0000 g | INTRAVENOUS | Status: AC
  Administered 2017-12-11 (×2): 2 g via INTRAVENOUS
  Filled 2017-12-11: qty 100

## 2017-12-11 MED ORDER — ATORVASTATIN CALCIUM 80 MG PO TABS
80.0000 mg | ORAL_TABLET | Freq: Every day | ORAL | Status: DC
  Administered 2017-12-11 – 2017-12-13 (×3): 80 mg via ORAL
  Filled 2017-12-11 (×3): qty 1

## 2017-12-11 MED ORDER — SODIUM CHLORIDE 0.9 % IV SOLN: 500.0000 mL | Freq: Once | INTRAVENOUS | Status: DC | PRN

## 2017-12-11 MED ORDER — SODIUM CHLORIDE 0.9 % IV SOLN
INTRAVENOUS | Status: DC
  Administered 2017-12-11: 07:00:00 via INTRAVENOUS

## 2017-12-11 MED ORDER — CHLORHEXIDINE GLUCONATE CLOTH 2 % EX PADS: 6.0000 | MEDICATED_PAD | Freq: Once | CUTANEOUS | Status: DC

## 2017-12-11 MED ORDER — OXYCODONE HCL 5 MG PO TABS: 5.0000 mg | ORAL_TABLET | Freq: Once | ORAL | Status: DC | PRN

## 2017-12-11 MED ORDER — ONDANSETRON HCL 4 MG/2ML IJ SOLN
INTRAMUSCULAR | Status: DC | PRN
  Administered 2017-12-11: 4 mg via INTRAVENOUS

## 2017-12-11 MED ORDER — ESMOLOL HCL 100 MG/10ML IV SOLN
INTRAVENOUS | Status: DC | PRN
  Administered 2017-12-11 (×2): 20 mg via INTRAVENOUS

## 2017-12-11 MED ORDER — PANTOPRAZOLE SODIUM 40 MG PO TBEC
40.0000 mg | DELAYED_RELEASE_TABLET | Freq: Every day | ORAL | Status: DC
  Administered 2017-12-11 – 2017-12-13 (×3): 40 mg via ORAL
  Filled 2017-12-11 (×3): qty 1

## 2017-12-11 MED ORDER — PROTAMINE SULFATE 10 MG/ML IV SOLN
INTRAVENOUS | Status: DC | PRN
  Administered 2017-12-11: 50 mg via INTRAVENOUS

## 2017-12-11 MED ORDER — DOCUSATE SODIUM 100 MG PO CAPS
100.0000 mg | ORAL_CAPSULE | Freq: Every day | ORAL | Status: DC
  Administered 2017-12-12: 100 mg via ORAL
  Filled 2017-12-11: qty 1

## 2017-12-11 MED ORDER — ALUM & MAG HYDROXIDE-SIMETH 200-200-20 MG/5ML PO SUSP: 15.0000 mL | ORAL | Status: DC | PRN

## 2017-12-11 MED ORDER — LACTATED RINGERS IV SOLN
INTRAVENOUS | Status: DC | PRN
Start: 2017-12-11 — End: 2017-12-11

## 2017-12-11 MED ORDER — LIDOCAINE 2% (20 MG/ML) 5 ML SYRINGE
INTRAMUSCULAR | Status: AC
  Filled 2017-12-11: qty 10

## 2017-12-11 MED ORDER — HEPARIN SODIUM (PORCINE) 1000 UNIT/ML IJ SOLN
INTRAMUSCULAR | Status: AC
  Filled 2017-12-11: qty 2

## 2017-12-11 MED ORDER — POTASSIUM CHLORIDE CRYS ER 20 MEQ PO TBCR: 20.0000 meq | EXTENDED_RELEASE_TABLET | Freq: Every day | ORAL | Status: DC | PRN

## 2017-12-11 MED ORDER — MAGNESIUM SULFATE 2 GM/50ML IV SOLN: 2.0000 g | Freq: Every day | INTRAVENOUS | Status: DC | PRN

## 2017-12-11 MED ORDER — OXYCODONE-ACETAMINOPHEN 5-325 MG PO TABS
1.0000 | ORAL_TABLET | ORAL | Status: DC | PRN
  Administered 2017-12-12: 2 via ORAL
  Filled 2017-12-11: qty 2

## 2017-12-11 MED ORDER — MIDAZOLAM HCL 2 MG/2ML IJ SOLN
INTRAMUSCULAR | Status: DC | PRN
  Administered 2017-12-11: 2 mg via INTRAVENOUS

## 2017-12-11 MED ORDER — CEFAZOLIN SODIUM-DEXTROSE 2-4 GM/100ML-% IV SOLN
2.0000 g | Freq: Three times a day (TID) | INTRAVENOUS | Status: AC
  Administered 2017-12-11 – 2017-12-12 (×2): 2 g via INTRAVENOUS
  Filled 2017-12-11 (×2): qty 100

## 2017-12-11 MED ORDER — LABETALOL HCL 5 MG/ML IV SOLN: 10.0000 mg | INTRAVENOUS | Status: DC | PRN

## 2017-12-11 MED ORDER — ADULT MULTIVITAMIN W/MINERALS CH
1.0000 | ORAL_TABLET | Freq: Every day | ORAL | Status: DC
  Administered 2017-12-11 – 2017-12-13 (×3): 1 via ORAL
  Filled 2017-12-11 (×4): qty 1

## 2017-12-11 MED ORDER — LORATADINE 10 MG PO TABS
10.0000 mg | ORAL_TABLET | Freq: Every day | ORAL | Status: DC
  Administered 2017-12-11 – 2017-12-13 (×3): 10 mg via ORAL
  Filled 2017-12-11 (×3): qty 1

## 2017-12-11 MED ORDER — ACETAMINOPHEN 325 MG RE SUPP: 325.0000 mg | RECTAL | Status: DC | PRN

## 2017-12-11 MED ORDER — PROMETHAZINE HCL 25 MG/ML IJ SOLN: 6.2500 mg | INTRAMUSCULAR | Status: DC | PRN

## 2017-12-11 MED ORDER — FENTANYL CITRATE (PF) 250 MCG/5ML IJ SOLN
INTRAMUSCULAR | Status: AC
  Filled 2017-12-11: qty 5

## 2017-12-11 MED ORDER — POLYETHYLENE GLYCOL 3350 17 G PO PACK: 17.0000 g | PACK | Freq: Every day | ORAL | Status: DC | PRN

## 2017-12-11 MED ORDER — ONDANSETRON HCL 4 MG/2ML IJ SOLN: 4.0000 mg | Freq: Four times a day (QID) | INTRAMUSCULAR | Status: DC | PRN

## 2017-12-11 MED ORDER — ALBUTEROL SULFATE HFA 108 (90 BASE) MCG/ACT IN AERS
INHALATION_SPRAY | RESPIRATORY_TRACT | Status: DC | PRN
  Administered 2017-12-11: 4 via RESPIRATORY_TRACT

## 2017-12-11 MED ORDER — DEXAMETHASONE SODIUM PHOSPHATE 10 MG/ML IJ SOLN
INTRAMUSCULAR | Status: AC
  Filled 2017-12-11: qty 2

## 2017-12-11 MED ORDER — PHENOL 1.4 % MT LIQD: 1.0000 | OROMUCOSAL | Status: DC | PRN

## 2017-12-11 MED ORDER — PHENYLEPHRINE 40 MCG/ML (10ML) SYRINGE FOR IV PUSH (FOR BLOOD PRESSURE SUPPORT)
PREFILLED_SYRINGE | INTRAVENOUS | Status: DC | PRN
  Administered 2017-12-11 (×4): 80 ug via INTRAVENOUS

## 2017-12-11 MED ORDER — 0.9 % SODIUM CHLORIDE (POUR BTL) OPTIME
TOPICAL | Status: DC | PRN
  Administered 2017-12-11: 1000 mL

## 2017-12-11 MED ORDER — LIDOCAINE 2% (20 MG/ML) 5 ML SYRINGE
INTRAMUSCULAR | Status: DC | PRN
  Administered 2017-12-11: 60 mg via INTRAVENOUS

## 2017-12-11 MED ORDER — HEPARIN SODIUM (PORCINE) 1000 UNIT/ML IJ SOLN
INTRAMUSCULAR | Status: DC | PRN
  Administered 2017-12-11 (×2): 5000 [IU] via INTRAVENOUS
  Administered 2017-12-11: 10000 [IU] via INTRAVENOUS

## 2017-12-11 MED ORDER — ACETAMINOPHEN 325 MG PO TABS
325.0000 mg | ORAL_TABLET | ORAL | Status: DC | PRN
  Administered 2017-12-12 – 2017-12-13 (×3): 650 mg via ORAL
  Filled 2017-12-11 (×3): qty 2

## 2017-12-11 MED ORDER — MIDAZOLAM HCL 2 MG/2ML IJ SOLN
INTRAMUSCULAR | Status: AC
  Filled 2017-12-11: qty 2

## 2017-12-11 MED ORDER — SODIUM CHLORIDE 0.9 % IV SOLN
INTRAVENOUS | Status: DC | PRN
  Administered 2017-12-11: 30 ug/min via INTRAVENOUS

## 2017-12-11 MED ORDER — DEXAMETHASONE SODIUM PHOSPHATE 10 MG/ML IJ SOLN
INTRAMUSCULAR | Status: DC | PRN
  Administered 2017-12-11: 10 mg via INTRAVENOUS

## 2017-12-11 MED ORDER — HEPARIN SODIUM (PORCINE) 5000 UNIT/ML IJ SOLN
5000.0000 [IU] | Freq: Three times a day (TID) | INTRAMUSCULAR | Status: DC
  Administered 2017-12-12 – 2017-12-13 (×3): 5000 [IU] via SUBCUTANEOUS
  Filled 2017-12-11 (×3): qty 1

## 2017-12-11 MED ORDER — FLUTICASONE-UMECLIDIN-VILANT 100-62.5-25 MCG/INH IN AEPB: 1.0000 | INHALATION_SPRAY | Freq: Every day | RESPIRATORY_TRACT | Status: DC

## 2017-12-11 MED ORDER — OXYCODONE HCL 5 MG/5ML PO SOLN: 5.0000 mg | Freq: Once | ORAL | Status: DC | PRN

## 2017-12-11 MED ORDER — SUGAMMADEX SODIUM 200 MG/2ML IV SOLN
INTRAVENOUS | Status: DC | PRN
  Administered 2017-12-11: 200 mg via INTRAVENOUS

## 2017-12-11 MED ORDER — SUCCINYLCHOLINE CHLORIDE 200 MG/10ML IV SOSY
PREFILLED_SYRINGE | INTRAVENOUS | Status: AC
  Filled 2017-12-11: qty 10

## 2017-12-11 MED ORDER — SODIUM CHLORIDE 0.9 % IV SOLN
INTRAVENOUS | Status: DC
  Administered 2017-12-11: 15:00:00 via INTRAVENOUS

## 2017-12-11 MED ORDER — DILTIAZEM HCL ER COATED BEADS 120 MG PO CP24
120.0000 mg | ORAL_CAPSULE | Freq: Every day | ORAL | Status: DC
  Administered 2017-12-11 – 2017-12-13 (×3): 120 mg via ORAL
  Filled 2017-12-11 (×3): qty 1

## 2017-12-11 MED ORDER — FLUTICASONE FUROATE-VILANTEROL 200-25 MCG/INH IN AEPB
1.0000 | INHALATION_SPRAY | Freq: Every day | RESPIRATORY_TRACT | Status: DC
  Administered 2017-12-12 – 2017-12-13 (×2): 1 via RESPIRATORY_TRACT
  Filled 2017-12-11: qty 28

## 2017-12-11 MED ORDER — IODIXANOL 320 MG/ML IV SOLN
INTRAVENOUS | Status: DC | PRN
  Administered 2017-12-11: 150 mL via INTRA_ARTERIAL

## 2017-12-11 MED ORDER — SODIUM CHLORIDE 0.9 % IV SOLN
INTRAVENOUS | Status: DC | PRN
  Administered 2017-12-11 (×2): 500 mL

## 2017-12-11 MED ORDER — CEFAZOLIN SODIUM 1 G IJ SOLR
INTRAMUSCULAR | Status: AC
  Filled 2017-12-11: qty 40

## 2017-12-11 MED ORDER — FERROUS SULFATE 325 (65 FE) MG PO TABS
325.0000 mg | ORAL_TABLET | Freq: Every day | ORAL | Status: DC
Start: 2017-12-12 — End: 2017-12-13
  Administered 2017-12-12 – 2017-12-13 (×2): 325 mg via ORAL
  Filled 2017-12-11 (×2): qty 1

## 2017-12-11 MED ORDER — UMECLIDINIUM BROMIDE 62.5 MCG/INH IN AEPB
1.0000 | INHALATION_SPRAY | Freq: Every day | RESPIRATORY_TRACT | Status: DC
  Administered 2017-12-12 – 2017-12-13 (×2): 1 via RESPIRATORY_TRACT
  Filled 2017-12-11: qty 7

## 2017-12-11 MED ORDER — METOPROLOL TARTRATE 5 MG/5ML IV SOLN
INTRAVENOUS | Status: AC
  Filled 2017-12-11: qty 5

## 2017-12-11 MED ORDER — ROCURONIUM BROMIDE 10 MG/ML (PF) SYRINGE
PREFILLED_SYRINGE | INTRAVENOUS | Status: DC | PRN
  Administered 2017-12-11: 50 mg via INTRAVENOUS
  Administered 2017-12-11 (×3): 20 mg via INTRAVENOUS
  Administered 2017-12-11: 10 mg via INTRAVENOUS
  Administered 2017-12-11 (×2): 20 mg via INTRAVENOUS
  Administered 2017-12-11: 30 mg via INTRAVENOUS

## 2017-12-11 SURGICAL SUPPLY — 82 items
ADH SKN CLS APL DERMABOND .7 (GAUZE/BANDAGES/DRESSINGS) ×2
AGENT HMST SPONGE THK3/8 (HEMOSTASIS)
BAG SNAP BAND KOVER 36X36 (MISCELLANEOUS) ×1 IMPLANT
BLADE CLIPPER SURG (BLADE) ×1 IMPLANT
CANISTER SUCT 3000ML PPV (MISCELLANEOUS) ×2 IMPLANT
CATH ANGIO 5F BER2 100CM (CATHETERS) ×1 IMPLANT
CATH ANGIO 5F BER2 65CM (CATHETERS) IMPLANT
CATH BEACON 5.038 65CM KMP-01 (CATHETERS) ×1 IMPLANT
CATH INFINITI 6F AL2 (CATHETERS) ×1 IMPLANT
CATH OMNI FLUSH .035X70CM (CATHETERS) ×1 IMPLANT
CATH TEMPO 5F RIM 65CM (CATHETERS) ×1 IMPLANT
CATH VANSCH 5FR 6CM (CATHETERS) ×1 IMPLANT
COVER DOME SNAP 22 D (MISCELLANEOUS) ×1 IMPLANT
COVER PROBE W GEL 5X96 (DRAPES) ×2 IMPLANT
COVER WAND RF STERILE (DRAPES) ×2 IMPLANT
DERMABOND ADVANCED (GAUZE/BANDAGES/DRESSINGS) ×2
DERMABOND ADVANCED .7 DNX12 (GAUZE/BANDAGES/DRESSINGS) ×1 IMPLANT
DEVICE CLOSURE PERCLS PRGLD 6F (VASCULAR PRODUCTS) IMPLANT
DEVICE ENSNARE  12MMX20MM (VASCULAR PRODUCTS) ×1
DEVICE ENSNARE 12MMX20MM (VASCULAR PRODUCTS) IMPLANT
DEVICE TORQUE KENDALL .025-038 (MISCELLANEOUS) ×1 IMPLANT
DRSG TEGADERM 2-3/8X2-3/4 SM (GAUZE/BANDAGES/DRESSINGS) ×4 IMPLANT
DRYSEAL FLEXSHEATH 12FR 33CM (SHEATH) ×1
DRYSEAL FLEXSHEATH 12FR 45CM (SHEATH) ×1
DRYSEAL FLEXSHEATH 18FR 33CM (SHEATH) ×2
ELECT CAUTERY BLADE 6.4 (BLADE) ×1 IMPLANT
ELECT REM PT RETURN 9FT ADLT (ELECTROSURGICAL) ×4
ELECTRODE REM PT RTRN 9FT ADLT (ELECTROSURGICAL) ×2 IMPLANT
EXCLUDER TNK LEG 28MX14X16 (Endovascular Graft) IMPLANT
EXCLUDER TRUNK LEG 28MX14X16 (Endovascular Graft) ×2 IMPLANT
GAUZE SPONGE 2X2 8PLY STRL LF (GAUZE/BANDAGES/DRESSINGS) IMPLANT
GLOVE BIO SURGEON STRL SZ 6.5 (GLOVE) ×3 IMPLANT
GLOVE BIO SURGEON STRL SZ7.5 (GLOVE) ×2 IMPLANT
GLOVE BIOGEL PI IND STRL 6.5 (GLOVE) IMPLANT
GLOVE BIOGEL PI IND STRL 7.0 (GLOVE) IMPLANT
GLOVE BIOGEL PI INDICATOR 6.5 (GLOVE) ×2
GLOVE BIOGEL PI INDICATOR 7.0 (GLOVE) ×1
GLOVE SS BIOGEL STRL SZ 7 (GLOVE) IMPLANT
GLOVE SUPERSENSE BIOGEL SZ 7 (GLOVE) ×1
GLOVE SURG SS PI 7.0 STRL IVOR (GLOVE) ×2 IMPLANT
GOWN STRL REUS W/ TWL LRG LVL3 (GOWN DISPOSABLE) ×3 IMPLANT
GOWN STRL REUS W/TWL LRG LVL3 (GOWN DISPOSABLE) ×12
GRAFT BALLN CATH 65CM (STENTS) IMPLANT
GUIDEWIRE ANGLED .035X150CM (WIRE) ×1 IMPLANT
GUIDEWIRE ANGLED .035X260CM (WIRE) ×1 IMPLANT
HEMOSTAT SPONGE AVITENE ULTRA (HEMOSTASIS) IMPLANT
KIT BASIN OR (CUSTOM PROCEDURE TRAY) ×2 IMPLANT
KIT TURNOVER KIT B (KITS) ×2 IMPLANT
LEG CONTRALATERAL 16X14.5X12 (Vascular Products) ×1 IMPLANT
NDL PERC 18GX7CM (NEEDLE) ×1 IMPLANT
NEEDLE PERC 18GX7CM (NEEDLE) ×4 IMPLANT
NS IRRIG 1000ML POUR BTL (IV SOLUTION) ×2 IMPLANT
PACK ENDOVASCULAR (PACKS) ×2 IMPLANT
PAD ARMBOARD 7.5X6 YLW CONV (MISCELLANEOUS) ×4 IMPLANT
PENCIL BUTTON HOLSTER BLD 10FT (ELECTRODE) ×1 IMPLANT
PERCLOSE PROGLIDE 6F (VASCULAR PRODUCTS) ×26
SHEATH AVANTI 11CM 8FR (SHEATH) IMPLANT
SHEATH BRITE TIP 8FR 23CM (SHEATH) ×1 IMPLANT
SHEATH DRYSEAL FLEX 12FR 33CM (SHEATH) IMPLANT
SHEATH DRYSEAL FLEX 12FR 45CM (SHEATH) IMPLANT
SHEATH DRYSEAL FLEX 18FR 33CM (SHEATH) IMPLANT
SHEATH GUIDING 6.5 FR 55X73X9 (SHEATH) ×1 IMPLANT
SHEATH GUIDING 8.5FR 77X17 (SHEATH) ×1 IMPLANT
SHEATH HIGHFLEX ANSEL 6FRX55 (SHEATH) ×1 IMPLANT
SHEATH PINNACLE 8F 10CM (SHEATH) ×1 IMPLANT
SPONGE GAUZE 2X2 STER 10/PKG (GAUZE/BANDAGES/DRESSINGS) ×2
SPONGE LAP 18X18 X RAY DECT (DISPOSABLE) ×1 IMPLANT
STAPLER VISISTAT 35W (STAPLE) IMPLANT
STENT GRAFT BALLN CATH 65CM (STENTS)
STOPCOCK MORSE 400PSI 3WAY (MISCELLANEOUS) ×2 IMPLANT
SUT PROLENE 5 0 C 1 24 (SUTURE) IMPLANT
SUT VIC AB 2-0 SH 27 (SUTURE)
SUT VIC AB 2-0 SH 27XBRD (SUTURE) IMPLANT
SUT VIC AB 3-0 SH 27 (SUTURE)
SUT VIC AB 3-0 SH 27X BRD (SUTURE) IMPLANT
SUT VICRYL 4-0 PS2 18IN ABS (SUTURE) ×4 IMPLANT
SYR 30ML LL (SYRINGE) ×2 IMPLANT
TOWEL GREEN STERILE (TOWEL DISPOSABLE) ×3 IMPLANT
TRAY FOLEY MTR SLVR 16FR STAT (SET/KITS/TRAYS/PACK) ×2 IMPLANT
TUBING HIGH PRESSURE 120CM (CONNECTOR) ×2 IMPLANT
WIRE AMPLATZ SS-J .035X180CM (WIRE) ×4 IMPLANT
WIRE BENTSON .035X145CM (WIRE) ×2 IMPLANT

## 2017-12-11 NOTE — Anesthesia Procedure Notes (Signed)
Arterial Line Insertion Start/End11/05/2017 7:17 AM Performed by: Modena Morrow, CRNA, CRNA  Preanesthetic checklist: patient identified, IV checked, site marked, risks and benefits discussed, monitors and equipment checked and pre-op evaluation Lidocaine 1% used for infiltration Right, radial was placed Catheter size: 20 G Hand hygiene performed  and maximum sterile barriers used   Attempts: 1 Procedure performed without using ultrasound guided technique. Following insertion, Biopatch and dressing applied. Post procedure assessment: normal  Patient tolerated the procedure well with no immediate complications.

## 2017-12-11 NOTE — Interval H&P Note (Signed)
History and Physical Interval Note:  12/11/2017 7:01 AM  [name]  has presented today for surgery, with the diagnosis of abdominal aortic aneurysm  The various methods of treatment have been discussed with the patient and family. After consideration of risks, benefits and other options for treatment, the patient has consented to  Procedure(s): ABDOMINAL AORTIC ENDOVASCULAR STENT GRAFT (N/A) as a surgical intervention .  The patient's history has been reviewed, patient examined, no change in status, stable for surgery.  I have reviewed the patient's chart and labs.  Questions were answered to the patient's satisfaction.     Fabienne Bruns

## 2017-12-11 NOTE — Progress Notes (Signed)
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  Hopefully will improve over the next day. Close observation for now  ABIs in am  Fabienne Bruns 12/11/2017 11:36 PM --  Laboratory Lab Results: Recent Labs    12/11/17 1300  WBC 15.5*  HGB 13.0  HCT 40.3  PLT 172   BMET Recent Labs    12/11/17 1300  NA 140  K 4.8  CL 110  CO2 24  GLUCOSE 161*  BUN 12  CREATININE 0.73  CALCIUM 8.2*    COAG Lab Results  Component Value Date   INR 1.14 12/11/2017   INR 1.01 12/11/2017   INR 1.23 12/06/2017   No results found for: PTT

## 2017-12-11 NOTE — Anesthesia Postprocedure Evaluation (Signed)
Anesthesia Post Note  Patient: [name]  Procedure(s) Performed: ABDOMINAL AORTIC ENDOVASCULAR STENT GRAFT (N/A )     Patient location during evaluation: PACU Anesthesia Type: General Level of consciousness: awake and alert Pain management: pain level controlled Vital Signs Assessment: post-procedure vital signs reviewed and stable Respiratory status: spontaneous breathing, nonlabored ventilation, respiratory function stable and patient connected to nasal cannula oxygen Cardiovascular status: blood pressure returned to baseline and stable Postop Assessment: no apparent nausea or vomiting Anesthetic complications: no    Last Vitals:  Vitals:   12/11/17 1302 12/11/17 1317  BP: 117/65 113/67  Pulse: 85 97  Resp: (!) 21 19  Temp:    SpO2: 92% 94%    Last Pain:  Vitals:   12/11/17 1330  PainSc: 0-No pain                 Onesimo Lingard S

## 2017-12-11 NOTE — Transfer of Care (Signed)
Immediate Anesthesia Transfer of Care Note  Patient: [name]  Procedure(s) Performed: ABDOMINAL AORTIC ENDOVASCULAR STENT GRAFT (N/A )  Patient Location: PACU  Anesthesia Type:General  Level of Consciousness: awake, drowsy and patient cooperative  Airway & Oxygen Therapy: Patient Spontanous Breathing and Patient connected to face mask oxygen  Post-op Assessment: Report given to RN, Post -op Vital signs reviewed and stable and Patient moving all extremities X 4  Post vital signs: Reviewed and stable  Last Vitals:  Vitals Value Taken Time  BP 151/75 12/11/2017 12:32 PM  Temp    Pulse 120 12/11/2017 12:38 PM  Resp 22 12/11/2017 12:38 PM  SpO2 97 % 12/11/2017 12:38 PM  Vitals shown include unvalidated device data.  Last Pain:  Vitals:   12/11/17 0650  PainSc: 0-No pain      Patients Stated Pain Goal: 6 (12/11/17 0650)  Complications: No apparent anesthesia complications

## 2017-12-11 NOTE — Anesthesia Procedure Notes (Signed)
Procedure Name: Intubation Date/Time: 12/11/2017 7:56 AM Performed by: Julieta Bellini, CRNA Pre-anesthesia Checklist: Patient identified, Emergency Drugs available, Suction available and Patient being monitored Patient Re-evaluated:Patient Re-evaluated prior to induction Oxygen Delivery Method: Circle system utilized Preoxygenation: Pre-oxygenation with 100% oxygen Induction Type: IV induction Ventilation: Mask ventilation without difficulty and Oral airway inserted - appropriate to patient size Laryngoscope Size: Mac and 4 Grade View: Grade I Tube type: Oral Tube size: 7.5 mm Number of attempts: 1 Airway Equipment and Method: Stylet Placement Confirmation: ETT inserted through vocal cords under direct vision,  positive ETCO2 and breath sounds checked- equal and bilateral Secured at: 23 cm Tube secured with: Tape Dental Injury: Teeth and Oropharynx as per pre-operative assessment

## 2017-12-11 NOTE — Discharge Instructions (Signed)
  [facility] [facility]   Discharge Instructions  Endovascular Aortic Aneurysm Repair  Please refer to the following instructions for your post-procedure care. Your surgeon or Physician Assistant will discuss any changes with you.  Activity  You are encouraged to walk as much as you can. You can slowly return to normal activities but must avoid strenuous activity and heavy lifting until your doctor tells you it's OK. Avoid activities such as vacuuming or swinging a gold club. It is normal to feel tired for several weeks after your surgery. Do not drive until your doctor gives the OK and you are no longer taking prescription pain medications. It is also normal to have difficulty with sleep habits, eating, and bowel movements after surgery. These will go away with time.  Bathing/Showering  Shower daily after you go home.  Do not soak in a bathtub, hot tub, or swim until the incision heals completely.  If you have incisions in your groin, wash the groin wounds with soap and water daily and pat dry. (No tub bath-only shower)  Then put a dry gauze or washcloth there to keep this area dry to help prevent wound infection daily and as needed.  Do not use Vaseline or neosporin on your incisions.  Only use soap and water on your incisions and then protect and keep dry.  Incision Care  Shower every day. Clean your incision with mild soap and water. Pat the area dry with a clean towel. You do not need a bandage unless otherwise instructed. Do not apply any ointments or creams to your incision. If you clothing is irritating, you may cover your incision with a dry gauze pad.  Diet  Resume your normal diet. There are no special food restrictions following this procedure. A low fat/low cholesterol diet is recommended for all patients with vascular disease. In order to heal from your surgery, it is CRITICAL to get adequate nutrition. Your body requires vitamins, minerals, and protein.  Vegetables are the best source of vitamins and minerals. Vegetables also provide the perfect balance of protein. Processed food has little nutritional value, so try to avoid this.  Medications  Resume taking all of your medications unless your doctor or nurse practitioner tells you not to. If your incision is causing pain, you may take over-the-counter pain relievers such as acetaminophen (Tylenol). If you were prescribed a stronger pain medication, please be aware these medications can cause nausea and constipation. Prevent nausea by taking the medication with a snack or meal. Avoid constipation by drinking plenty of fluids and eating foods with a high amount of fiber, such as fruits, vegetables, and grains.  Do not take Tylenol if you are taking prescription pain medications.   Follow up  Our office will schedule a follow-up appointment with a CT scan 3-4 weeks after your surgery.  Please call us immediately for any of the following conditions  Severe or worsening pain in your legs or feet or in your abdomen back or chest. Increased pain, redness, drainage (pus) from your incision site. Increased abdominal pain, bloating, nausea, vomiting or persistent diarrhea. Fever of 101 degrees or higher. Swelling in your leg (s),  Reduce your risk of vascular disease  Stop smoking. If you would like help call [facility] at 1-800-QUIT-NOW (1-800-784-8669) or [place] at [phone]. Manage your cholesterol Maintain a desired weight Control your diabetes Keep your blood pressure down  If you have questions, please call the office at [phone].  

## 2017-12-11 NOTE — Op Note (Signed)
Procedure: Gore Excluder stent graft repair of infrarenal abdominal aortic aneurysm  Operative findings:   #1 Bilateral Proglide closure   #2 28x14x16 cm main body Gore Excluder device delivered via a left femoral system   #3 14 x 12 cm left iliac limb contralateral              #4 ultrasound-guided cannulation bilateral common femoral arteries  Assistant: Doreatha Massed, PA-C   PROCEDURE DETAIL: After obtaining informed consent the patient was taken to the operating room. The patient was placed in supine position the operating room table. After induction of general anesthesia and endotracheal intubation a Foley catheter was placed. Next the patient was prepped and draped in usual sterile fashion from the nipples down to the knees. Ultrasound was used to identify the right common femoral artery as well as the femoral bifurcation. An 11 blade was used to make a small neck in the skin over the level of the right common femoral artery. An introducer needle was then used to cannulate the right common femoral artery without difficulty. A 0.035 Bentson wire was then threaded up into the abdominal aorta through the right femoral system. A short 9 French dilator was placed over the guidewire the right femoral system. This was used to dilate the tract. The dilator was then removed and a Proglide device inserted over the guidewire into the right femoral system and this was deployed at the 2:00 position. The Proglide device was then removed and an additional Proglide device was brought in operative field and deployed at the 10:00 position. The sutures were kept separate and tagged with suture tags. Next the short 9 French sheath was then placed back over the guidewire into the right femoral system and the dilator removed and the sheath thoroughly flushed with heparinized saline. Attention was then turned to the left groin. Again using ultrasound the left common femoral artery was identified. The femoral  bifurcation was also identified. A small nick was made in the skin with the 11 blade. A hemostat was used to dilate a tract down to the artery. An introducer needle was then used to cannulate the left common femoral artery without difficulty. A 0.035 Bentson wire was then threaded up into the abdominal aorta under fluoroscopic guidance. A 9 French dilator was then placed over the wire to dilate the tract. Two Proglide devices were again brought on operative field and these were fired sequentially in the 10:00 position followed by an additional Proglide at the 2:00 position. The Proglide delivery systems were removed and the long 9 French sheath placed back over the guidewire up to the level of the iliac of the aortic bifurcation.  At this point, a 5 [place] Omni flush catheter was placed over the guidewire and the left groin up and the abdominal aorta. An abdominal aortogram was obtained in the AP position to determine level of the left and right renal arteries. At this point a 28 x 14 x 16 Gore Excluder main body device was selected. The Bentson wire from the right groin was advanced up into the descending thoracic aorta over a Kumpe catheter and the Bentsen wire replaced with an 035 Amplatz wire.  I attempted to advance an 68 French dry seal sheath over this but there was a narrowing at the iliac bifurcation which would not allow the sheath to advance.  We left this in place at the level of the iliac bifurcation.  Attention was then turned back to the left groin.  The  Omni Flush catheter was removed over an Amplatz wire and I was able to easily advance the 18 French sheath up the left groin into the abdominal aorta.  The Omni Flush catheter was then removed and placed over the wire on the right side.  The main body device was then placed over the Amplatz wire in the leftt groin and advanced up to the level of the renal arteries.  Magnified views of the renal arteries were performed to make sure that we were not  covering these. The top portion of the stent graft was then deployed with the end of the stent just below the level of the left renal artery and this came over to just below the level of the right renal artery. The main body was delivered all the way down to the contralateral gate. Attention was then turned to the right groin. The Omni flush catheter was pulled down over a guidewire and removed and the Bentson wire placed in position to cannulate the contralateral gate. The Kumpe catheter was placed back over the guidewire in the right femoral system. A 5 [place] Kumpe catheter was placed over this and this was used to selectively catheterize the contralateral gate.  However due to positioning of the contralateral limb I had difficulty cannulating the gate.  Using a combination of multiple different catheters including a banshee Berenstein to an 035 angled Glidewire eventually and Oscor sheath and multiple combinations of these catheters I was still unable to cannulate the contralateral gate.  The remainder of the ipsilateral iliac limb was also deployed. At this point I used a combination of the Omni Flush catheter a rim catheter to try to advance the angled Glidewire up and over the flow divider to the ipsilateral groin.  These were also unsuccessful.  I was finally able to advance the [facility] articulating sheath up and over the flow divider bifurcation from the main body left side and advanced and 035 Berenstein catheter over this into the left iliac system.  I was able then to finally snare and 035 angled Glidewire and pull this out through the contralateral groin.  The Berenstein catheter was then advanced retrograde over this the [facility] catheter was straightened and the Glidewire allowed to reflux into the aorta.  This Glidewire was then replaced with an 035 Amplatz wire.  Retrograde contrast angiogram was performed to determine precise level of the internal iliac.   The pigtail  catheter was then placed in a  location so that we could use its markers to determine the exact length to the iliac bifurcation. An Amplatz wire was placed back through the pigtail catheter. A retrograde contrast angiogram was performed to determine the level of the left internal iliac artery and a 14 x 12 cm iliac limb was selected. The pigtail catheter was removed over the guidewire and the 14 x 12 cm limb advanced so there was full coverage of the long marker on the contralateral limb. This was then deployed in the usual fashion down to the iliac bifurcation. The delivery system was then removed.  A retrograde contrast angiogram was also performed to make sure that the right iliac limb did not cover the right internal iliac artery.   Attention was then turned back to the left iliac system and a Gore aortic balloon placed over the wire up to the level of the proximal end of the stent and this was ballooned to profile. The limb attachment site was also ballooned as well as the distal attachment  site. Attention was then turned to the right groin and the balloon was advanced over the guidewire on the right side and the distal attachment site as well as the midportion of iliac limb were also ballooned. The 5 [place] Omni flush catheter was then placed back to the guidewire on the right side and a completion arteriogram was obtained. This showed no evidence of proximal or distal type I endoleak. There was no type II endoleak. There is no filling of the aneurysm sac.  There was severe narrowing of the origin of the left common iliac artery of the native system but there was flow into the left iliac.  There was brisk flow into the right internal and external iliac arteries. At this point the Omni flush catheter was removed over a guidewire. All delivery devices were removed. We then proceeded to remove the large 18 French sheath from the right side with the guidewire in place. With pressure held above and below the insertion site the lateral Proglide  closure was secured down.  An additional lateral Proglide was then placed over the guidewire and deployed.  I had to fire 3 additional Proglide's.  There was good hemostasis. The guidewire was removed from the right side. Attention was then turned to the left groin. In similar fashion the 53 French sheath was removed and the guidewire left in place.The medial and lateral Proglide was secured and 3 additional Proglides were deployed over the wire to obtain hemostasis.  There was good hemostasis and the Bentsen wire was removed from the left groin. The patient had been given 15000 units of heparin before introducing the main body device. This was partially reversed with 50 mg of protamine at the end of the case. Each groin puncture site was then closed with a running 4-0 Vicryl subcuticular stitch.  The patient tolerated procedure well and there were no complications. Instrument sponge and needle counts correct in the case. Patient was awakened in the operating room extubated and taken to the recovery room in stable condition.  There was Doppler flow to both feet at the end of the case.  Fabienne Bruns, MD [facility] [place] Office: [phone] Pager: [phone]

## 2017-12-11 NOTE — Progress Notes (Addendum)
Pt feels ok but complains of numbness and tingling in leg leg from the knee down which has been present since he woke up in the PACU.  He had similar numbness in the right foot but states this is improving.  Vitals:   12/11/17 1302 12/11/17 1317 12/11/17 1413 12/11/17 1715  BP: 117/65 113/67 103/61 138/81  Pulse: 85 97  (!) 115  Resp: (!) 21 19 20  (!) 21  Temp:   98.2 F (36.8 C) 98.3 F (36.8 C)  TempSrc:   Oral Oral  SpO2: 92% 94% 91%   Weight:      Height:        Left foot slightly cool with monophasic DP doppler Right foot similar temp to left DP PT doppler monophasic bilaterally but right stronger than left Calf soft bilaterally  A: left > right leg foot numbness.  Has doppler flow but had difficult proglide closure of femoral artery and prolonged occlusive sheaths both leg.  P: Careful observation for now.  If symptoms worsen may need CTA to assess left leg arteries Doubt compartment syndrome since legs are soft with no pain on palpation  Will need ABIs tomorrow morning regardless.  Will recheck later this evening.  Fabienne Bruns, MD [facility] [place] Office: [phone] Pager: [phone]

## 2017-12-12 ENCOUNTER — Encounter (HOSPITAL_COMMUNITY): Payer: Self-pay | Admitting: Vascular Surgery

## 2017-12-12 ENCOUNTER — Inpatient Hospital Stay (HOSPITAL_COMMUNITY): Payer: Medicare Other

## 2017-12-12 DIAGNOSIS — R202 Paresthesia of skin: Secondary | ICD-10-CM

## 2017-12-12 LAB — CBC
HCT: 33.2 % — ABNORMAL LOW (ref 39.0–52.0)
Hemoglobin: 10.4 g/dL — ABNORMAL LOW (ref 13.0–17.0)
MCH: 31.4 pg (ref 26.0–34.0)
MCHC: 31.3 g/dL (ref 30.0–36.0)
MCV: 100.3 fL — ABNORMAL HIGH (ref 80.0–100.0)
Platelets: 144 10*3/uL — ABNORMAL LOW (ref 150–400)
RBC: 3.31 MIL/uL — ABNORMAL LOW (ref 4.22–5.81)
RDW: 12.3 % (ref 11.5–15.5)
WBC: 10.8 10*3/uL — ABNORMAL HIGH (ref 4.0–10.5)
nRBC: 0 % (ref 0.0–0.2)

## 2017-12-12 LAB — POCT ACTIVATED CLOTTING TIME
Activated Clotting Time: 312 seconds
Activated Clotting Time: 241 seconds
Activated Clotting Time: 312 seconds

## 2017-12-12 LAB — BASIC METABOLIC PANEL
Anion gap: 6 (ref 5–15)
BUN: 6 mg/dL — ABNORMAL LOW (ref 8–23)
CO2: 25 mmol/L (ref 22–32)
Calcium: 7.2 mg/dL — ABNORMAL LOW (ref 8.9–10.3)
Chloride: 101 mmol/L (ref 98–111)
Creatinine, Ser: 1.02 mg/dL (ref 0.61–1.24)
GFR calc Af Amer: >60 mL/min (ref >60)
GFR calc non Af Amer: >60 mL/min (ref >60)
Glucose, Bld: 423 mg/dL — ABNORMAL HIGH (ref 70–99)
Potassium: 3.5 mmol/L (ref 3.5–5.1)
Sodium: 132 mmol/L — ABNORMAL LOW (ref 135–145)

## 2017-12-12 NOTE — Progress Notes (Signed)
[facility] [place]  Subjective  - left leg numbness a little better   Objective (!) 143/73 93 97.6 F (36.4 C) (Oral) 16 94%  Intake/Output Summary (Last 24 hours) at 12/12/2017 0602 Last data filed at 12/12/2017 5784 Gross per 24 hour  Intake 5716.8 ml  Output 2955 ml  Net 2761.8 ml   Neuro 5/5 motor foot flexion extension subjective decreased sensation from left knee down but less numb to level of ankle Right foot ok  Calves soft Groins no hematoma DP PT doppler bilaterally fairly symmetric   Assessment/Planning: D/c foley Ambulate ABIs this morning May need to delay d/c til tomorrow to work out numbness issues 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

## 2017-12-12 NOTE — Progress Notes (Signed)
ABI's have been completed. Right 0.54 Left 0.66  12/12/17 10:50 AM Olen Cordial RVT

## 2017-12-13 MED ORDER — OXYCODONE-ACETAMINOPHEN 5-325 MG PO TABS: 1.0000 | ORAL_TABLET | Freq: Four times a day (QID) | ORAL | 0 refills | Status: DC | PRN

## 2017-12-13 MED FILL — OXYCODONE W/APAP 5/325 TAB: 5-325 | 1 days supply | Qty: 6 | Fill #0

## 2017-12-13 NOTE — Progress Notes (Signed)
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

## 2017-12-13 NOTE — Progress Notes (Signed)
Patient ambulated 940 feet in hallway. Tolerated fairly. Stated he felt tingling sensation in left foot.  Harriet Masson, RN

## 2017-12-13 NOTE — Progress Notes (Signed)
Oxygen in nasal cannula reduced to 1 L. Pt sating at 93 - 94%.  Will continue to monitor.  Harriet Masson, RN

## 2017-12-13 NOTE — Progress Notes (Signed)
Discharge instructions (including medications) discussed with and copy provided to patient/caregiver 

## 2017-12-13 NOTE — Care Management Note (Signed)
Case Management Note Donn Pierini RN, BSN Transitions of Care Unit 4E- RN Case Manager [phone]  Patient Details  Name: [name] MRN: [id number] Date of Birth: Aug 07, 1950  Subjective/Objective:   Pt admitted s/p AAA repair                 Action/Plan: PTA pt lived at home, independent, notified by Tiffany with [facility] they have pre-op referral for [facility] needs- pt for transition home- notified [facility]- they will f/u with pt post discharge for any needs.   Expected Discharge Date:  12/13/17               Expected Discharge Plan:  Home w Home Health Services  In-House Referral:     Discharge planning Services  CM Consult  Post Acute Care Choice:  Home Health Choice offered to:     DME Arranged:    DME Agency:     HH Arranged:    HH Agency:  [facility]  Status of Service:  Completed, signed off  If discussed at Long Length of Stay Meetings, dates discussed:    Discharge Disposition: home/home health   Additional Comments:  Darrold Span, RN 12/13/2017, 11:54 AM

## 2017-12-15 NOTE — Discharge Summary (Signed)
[facility] Discharge Summary   Patient ID:  [name] MRN: [id number] DOB/AGE: 65-12-1950 65 y.o.  Admit date: 12/11/2017 Discharge date: 12/13/2017 Date of Surgery: 12/11/2017 Surgeon: Surgeon(s): Sherren Kerns, MD  Admission Diagnosis: abdominal aortic aneurysm  Discharge Diagnoses:  abdominal aortic aneurysm  Secondary Diagnoses: Past Medical History:  Diagnosis Date  . AAA (abdominal aortic aneurysm) (HCC)   . Arthritis   . COPD (chronic obstructive pulmonary disease) (HCC)   . Coronary artery disease   . Dyspnea    over exerting self  . Fatty liver   . Hyperlipidemia   . Hypertension   . Myocardial infarction (HCC)   . Severe aortic stenosis 08/29/2016  . White coat hypertension     Procedure(s): ABDOMINAL AORTIC ENDOVASCULAR STENT GRAFT  Discharged Condition: stable  HPI: Patient is a 67 year old male who returns for follow-up today for known infrarenal abdominal aortic aneurysm.  He was last seen July 2019.  When his aneurysm had grown to 5.2 cm in diameter.  I offered him an endovascular stent graft repair at that point but he wishes to defer this because his wife had an orthopedic procedure in the near future.  He now returns wishing to have the aneurysm repair.  He denies any abdominal or back pain.  He is on Eliquis for paroxysmal atrial fibrillation.  He also had an aortic valve replacement in the past as well as coronary artery bypass grafting.  He was scheduled for EVAR repair by Dr. Darrick Penna.   Hospital Course:  [name] is a 67 y.o. male is S/P  Procedure(s): ABDOMINAL AORTIC ENDOVASCULAR STENT GRAFT  Post op was uneventful other than a chief complaint of left LE numbness.   Post op : ABI's have been completed. Right 0.54 Left 0.66 ABI most likely chronic SFA occlusions Patient discharged home in stable condition with 4 week f/u and repeat CTA Abd/pelv.   Significant Diagnostic Studies: CBC Lab Results   Component Value Date   WBC 10.8 (H) 12/12/2017   HGB 10.4 (L) 12/12/2017   HCT 33.2 (L) 12/12/2017   MCV 100.3 (H) 12/12/2017   PLT 144 (L) 12/12/2017    BMET    Component Value Date/Time   NA 132 (L) 12/12/2017 0326   NA 144 05/18/2015 0834   K 3.5 12/12/2017 0326   CL 101 12/12/2017 0326   CO2 25 12/12/2017 0326   GLUCOSE 423 (H) 12/12/2017 0326   BUN 6 (L) 12/12/2017 0326   BUN 9 05/18/2015 0834   CREATININE 1.02 12/12/2017 0326   CREATININE 0.64 (L) 11/13/2017 0850   CALCIUM 7.2 (L) 12/12/2017 0326   GFRNONAA >60 12/12/2017 0326   GFRNONAA 100 03/07/2017 0741   GFRAA >60 12/12/2017 0326   GFRAA 115 03/07/2017 0741   COAG Lab Results  Component Value Date   INR 1.14 12/11/2017   INR 1.01 12/11/2017   INR 1.23 12/06/2017     Disposition:  Discharge to :Home Discharge Instructions    Call MD for:  redness, tenderness, or signs of infection (pain, swelling, bleeding, redness, odor or green/yellow discharge around incision site)   Complete by:  As directed    Call MD for:  severe or increased pain, loss or decreased feeling  in affected limb(s)   Complete by:  As directed    Call MD for:  temperature >100.5   Complete by:  As directed    Resume previous diet   Complete by:  As directed  Allergies as of 12/13/2017      Reactions   Ciprofloxacin    Artificial heart valve      Medication List    TAKE these medications   acetaminophen 500 MG tablet Commonly known as:  TYLENOL Take 500 mg by mouth every 6 (six) hours as needed for moderate pain.   apixaban 5 MG Tabs tablet Commonly known as:  ELIQUIS Take 1 tablet (5 mg total) by mouth 2 (two) times daily.   aspirin 81 MG tablet Take 81 mg by mouth at bedtime.   atorvastatin 80 MG tablet Commonly known as:  LIPITOR TAKE 1 TABLET DAILY   diltiazem 120 MG 24 hr capsule Commonly known as:  CARDIZEM CD TAKE 1 CAPSULE BY MOUTH EVERY DAY What changed:  how much to take   ferrous sulfate 325 (65  FE) MG tablet Take 325 mg by mouth daily with breakfast.   fexofenadine 180 MG tablet Commonly known as:  ALLEGRA Take 180 mg by mouth daily.   fluticasone 50 MCG/ACT nasal spray Commonly known as:  FLONASE Place 2 sprays into both nostrils daily.   lisinopril 20 MG tablet Commonly known as:  PRINIVIL,ZESTRIL Take 1 tablet (20 mg total) by mouth daily.   multivitamin tablet Take 1 tablet by mouth daily.   oxyCODONE-acetaminophen 5-325 MG tablet Commonly known as:  PERCOCET/ROXICET Take 1 tablet by mouth every 6 (six) hours as needed for moderate pain.   [name] ELLIPTA 100-62.5-25 MCG/INH Aepb Generic drug:  Fluticasone-Umeclidin-Vilant Inhale 1 puff into the lungs daily.   VENTOLIN HFA 108 (90 Base) MCG/ACT inhaler Generic drug:  albuterol INHALE 2 PUFFS BY MOUTH EVERY 4 HOURS AS NEEDED What changed:  reasons to take this   vitamin C 500 MG tablet Commonly known as:  ASCORBIC ACID Take 500 mg by mouth daily.      Verbal and written Discharge instructions given to the patient. Wound care per Discharge AVS Follow-up Information    Sherren Kerns, MD Follow up in 4 week(s).   Specialties:  Vascular Surgery, Cardiology Contact information: [address] [place] [address] [phone]           Signed: Mosetta Pigeon 12/15/2017, 9:26 AM - For VQI Registry use --- Instructions: Press F2 to tab through selections.  Delete question if not applicable.   Post-op:  Time to Extubation: [ ]  In OR, [ ]  < 12 hrs, [ ]  12-24 hrs, [ ]  >=24 hrs Vasopressors Req. Post-op: No MI: [x ] No, [ ]  Troponin only, [ ]  EKG or Clinical New Arrhythmia: No CHF: No ICU Stay: 0 days Transfusion: No  If yes, 0 units given  Complications: Resp failure: [x ] none, [ ]  Pneumonia, [ ]  Ventilator Chg in renal function: [x ] none, [ ]  Inc. Cr > 0.5, [ ]  Temp. Dialysis, [ ]  Permanent dialysis Leg ischemia: [ x] No, [ ]  Yes, no Surgery needed, [ ]  Yes, Surgery needed, [ ]   Amputation Bowel ischemia: [x ] No, [ ]  Medical Rx, [ ]  Surgical Rx Wound complication: [x ] No, [ ]  Superficial separation/infection, [ ]  Return to OR Return to OR: No  Return to OR for bleeding: No Stroke: [ ]  None, [ ]  Minor, [ ]  Major  Discharge medications: Statin use:  Yes ASA use:  Yes Plavix use:  Yes Beta blocker use:  No  for medical reason

## 2017-12-25 ENCOUNTER — Other Ambulatory Visit: Payer: Self-pay

## 2017-12-25 DIAGNOSIS — I714 Abdominal aortic aneurysm, without rupture, unspecified: Secondary | ICD-10-CM

## 2017-12-28 ENCOUNTER — Telehealth: Payer: Self-pay | Admitting: Vascular Surgery

## 2017-12-28 NOTE — Telephone Encounter (Signed)
sch appt spk to pt mld ltr 01/10/18 CTA abd/pelvis 01-11-18 1pm MD p/o EVAR

## 2017-12-28 NOTE — Telephone Encounter (Signed)
-----   Message from Bayard HuggerAllison A Petty, LPN sent at 16/10/960411/18/2019 11:51 AM EST ----- Regarding: FW: post op apt [place],  Pt called today, [facility]. I will put the order in shortly.  Thank you  Revonda StandardAllison ----- Message ----- From: Sharee PimpleMcChesney, Marilyn K, RN Sent: 12/25/2017  11:32 AM EST To: Bayard HuggerAllison A Petty, LPN Subject: RE: post op apt                                This is the only message sent (12-11-17), the PA didn't send the postop info like she should,  Needs 4 weeks postop EVAR CTAs as per protocol    Sherren KernsFields, Charles E, MD sent to [facility] Charge Pool [phone]  [phone]   US bilat groin Emeline DarlingGore excluder stent   Lelon MastSamantha asst   Fabienne Brunsharles Fields    ----- Message ----- From: Bayard HuggerPetty, Allison A, LPN Sent: [id number]/29/562111/18/2019  11:11 AM EST To: Sharee PimpleMarilyn K McChesney, RN Subject: post op apt                                    Good Morning Joyce GrossKay,  Pt called today regarding his post op apt. I do not see a staff message regarding this pt and what he needs.  Thank you Revonda StandardAllison

## 2018-01-10 ENCOUNTER — Ambulatory Visit (HOSPITAL_COMMUNITY)
Admission: RE | Admit: 2018-01-10 | Discharge: 2018-01-10 | Disposition: A | Discharge status: Home / Self Care | Payer: Medicare Other | Source: Ambulatory Visit | Attending: Vascular Surgery | Admitting: Vascular Surgery

## 2018-01-10 DIAGNOSIS — I714 Abdominal aortic aneurysm, without rupture, unspecified: Secondary | ICD-10-CM

## 2018-01-10 MED ORDER — IOPAMIDOL (ISOVUE-370) INJECTION 76%
100.0000 mL | Freq: Once | INTRAVENOUS | Status: AC | PRN
  Administered 2018-01-10: 100 mL via INTRAVENOUS

## 2018-01-11 ENCOUNTER — Encounter: Payer: Self-pay | Admitting: Vascular Surgery

## 2018-01-11 ENCOUNTER — Other Ambulatory Visit: Payer: Self-pay

## 2018-01-11 ENCOUNTER — Ambulatory Visit (INDEPENDENT_AMBULATORY_CARE_PROVIDER_SITE_OTHER): Payer: Medicare Other | Admitting: Vascular Surgery

## 2018-01-11 VITALS — BP 109/73 | HR 93 | Temp 97.2°F | Resp 18 | Ht 69.0 in | Wt 204.0 lb

## 2018-01-11 DIAGNOSIS — I714 Abdominal aortic aneurysm, without rupture, unspecified: Secondary | ICD-10-CM

## 2018-01-11 NOTE — Progress Notes (Signed)
Patient is a 67 year old male who returns for follow-up today.  He underwent endovascular stent graft repair of an infrarenal abdominal aortic aneurysm with a Gore Excluder stent graft November 2019.  This was complicated by the fact he had a difficult gait cannulation and had some numbness in his left leg postoperatively.  This has now completely resolved.  He has essentially returned to normal activities.  Physical exam:  Vitals:   01/11/18 1258  BP: 109/73  Pulse: 93  Resp: 18  Temp: (!) 97.2 F (36.2 C)  SpO2: 90%  Weight: 204 lb (92.5 kg)  Height: 5\' 9"  (1.753 m)    Abdomen: Soft nontender no palpable pulsatile mass   extremities: 2+ femoral pulses bilaterally absent pedal pulses  Data: Patient had a CT Angio of the abdomen and pelvis yesterday.  This showed a patent stent graft with no type I or type II endoleak stent graft begins just below the level of the renal arteries.  Left and right renal arteries are widely patent.  Common external and internal iliac arteries are patent.  Aneurysm diameter was 5 cm.  Preoperatively it was 5.2 cm.  Assessment: Doing well status post Gore Excluder stent graft repair of infrarenal abdominal aortic aneurysm.  Plan: The patient will follow-up with repeat CT Angio of the abdomen and pelvis in 11 months.  If that CT is stable we will go to once yearly ultrasound.  Fabienne Brunsharles Erion Weightman, MD [facility] [place] Office: [phone] Pager: [phone]

## 2018-01-19 ENCOUNTER — Other Ambulatory Visit: Payer: Self-pay | Admitting: Family Medicine

## 2018-01-29 DIAGNOSIS — I2510 Atherosclerotic heart disease of native coronary artery without angina pectoris: Secondary | ICD-10-CM | POA: Diagnosis not present

## 2018-01-29 DIAGNOSIS — I739 Peripheral vascular disease, unspecified: Secondary | ICD-10-CM | POA: Diagnosis not present

## 2018-01-29 DIAGNOSIS — J449 Chronic obstructive pulmonary disease, unspecified: Secondary | ICD-10-CM | POA: Diagnosis not present

## 2018-01-29 DIAGNOSIS — I480 Paroxysmal atrial fibrillation: Secondary | ICD-10-CM | POA: Diagnosis not present

## 2018-02-19 ENCOUNTER — Other Ambulatory Visit: Payer: Self-pay | Admitting: Family Medicine

## 2018-02-19 DIAGNOSIS — D72829 Elevated white blood cell count, unspecified: Secondary | ICD-10-CM | POA: Diagnosis not present

## 2018-02-19 DIAGNOSIS — E7849 Other hyperlipidemia: Secondary | ICD-10-CM | POA: Diagnosis not present

## 2018-02-20 LAB — CBC WITH DIFFERENTIAL/PLATELET
Absolute Monocytes: 1125 cells/uL — ABNORMAL HIGH (ref 200–950)
Basophils Absolute: 59 cells/uL (ref 0–200)
Basophils Relative: 0.8 %
Eosinophils Absolute: 148 cells/uL (ref 15–500)
Eosinophils Relative: 2 %
HCT: 48.9 % (ref 38.5–50.0)
Hemoglobin: 16.6 g/dL (ref 13.2–17.1)
Lymphs Abs: 1658 cells/uL (ref 850–3900)
MCH: 30.8 pg (ref 27.0–33.0)
MCHC: 33.9 g/dL (ref 32.0–36.0)
MCV: 90.7 fL (ref 80.0–100.0)
MPV: 11.3 fL (ref 7.5–12.5)
Monocytes Relative: 15.2 %
Neutro Abs: 4410 cells/uL (ref 1500–7800)
Neutrophils Relative %: 59.6 %
Platelets: 236 10*3/uL (ref 140–400)
RBC: 5.39 10*6/uL (ref 4.20–5.80)
RDW: 12.7 % (ref 11.0–15.0)
Total Lymphocyte: 22.4 %
WBC: 7.4 10*3/uL (ref 3.8–10.8)

## 2018-02-20 LAB — LIPID PANEL
Cholesterol: 150 mg/dL (ref <200)
HDL: 49 mg/dL (ref >40)
LDL Cholesterol (Calc): 78 mg/dL (calc)
Non-HDL Cholesterol (Calc): 101 mg/dL (calc) (ref <130)
Total CHOL/HDL Ratio: 3.1 (calc) (ref <5.0)
Triglycerides: 132 mg/dL (ref <150)

## 2018-03-09 ENCOUNTER — Encounter: Payer: Self-pay | Admitting: Family Medicine

## 2018-03-12 ENCOUNTER — Other Ambulatory Visit: Payer: Self-pay | Admitting: Family Medicine

## 2018-03-12 MED ORDER — DILTIAZEM HCL 60 MG PO TABS: ORAL_TABLET | ORAL | 1 refills | Status: DC

## 2018-03-12 NOTE — Telephone Encounter (Signed)
Nurses Please stop diltiazem ER Send in prescription for diltiazem 60 mg regular, 1 taken twice daily, 90-day prescription with 1 refill  Send patient notification that this was completed thank you

## 2018-04-22 ENCOUNTER — Other Ambulatory Visit: Payer: Self-pay | Admitting: Cardiovascular Disease

## 2018-04-23 ENCOUNTER — Other Ambulatory Visit: Payer: Self-pay | Admitting: Family Medicine

## 2018-05-14 ENCOUNTER — Telehealth: Payer: Medicare Other | Admitting: Cardiovascular Disease

## 2018-05-17 ENCOUNTER — Ambulatory Visit: Payer: Medicare Other | Admitting: Cardiovascular Disease

## 2018-06-05 ENCOUNTER — Ambulatory Visit: Payer: Medicare Other | Admitting: Family Medicine

## 2018-06-10 ENCOUNTER — Other Ambulatory Visit: Payer: Self-pay | Admitting: Cardiovascular Disease

## 2018-07-10 ENCOUNTER — Ambulatory Visit (INDEPENDENT_AMBULATORY_CARE_PROVIDER_SITE_OTHER): Payer: Medicare Other | Admitting: Family Medicine

## 2018-07-10 ENCOUNTER — Other Ambulatory Visit: Payer: Self-pay

## 2018-07-10 ENCOUNTER — Encounter: Payer: Self-pay | Admitting: Family Medicine

## 2018-07-10 VITALS — BP 134/86 | Temp 97.4°F | Ht 69.0 in | Wt 189.0 lb

## 2018-07-10 DIAGNOSIS — L03119 Cellulitis of unspecified part of limb: Secondary | ICD-10-CM | POA: Diagnosis not present

## 2018-07-10 MED ORDER — CEPHALEXIN 500 MG PO CAPS: 500.0000 mg | ORAL_CAPSULE | Freq: Four times a day (QID) | ORAL | 0 refills | Status: DC

## 2018-07-10 MED ORDER — ATORVASTATIN CALCIUM 80 MG PO TABS: 80.0000 mg | ORAL_TABLET | Freq: Every day | ORAL | 1 refills | Status: DC

## 2018-07-10 NOTE — Progress Notes (Signed)
   Subjective:    Patient ID: [name], male    DOB: 08/10/1950, 68 y.o.   MRN: [id number]  HPIleft foot pain, swelling and bruising.  Started about 9 days ago. Pt thinks an insect might of bit him.  Started off as a bug bite about 8 9 days ago with some swelling and redness now is becoming red and tender he did have some bruising he is on a blood thinner he states his bruising is getting better.  Denies calf pain ankle pain fever chills sweats PMH benign  Review of Systems  Constitutional: Negative for activity change, fatigue and fever.  HENT: Negative for congestion and rhinorrhea.   Respiratory: Negative for cough and shortness of breath.   Cardiovascular: Negative for chest pain and leg swelling.  Gastrointestinal: Negative for abdominal pain, diarrhea and nausea.  Genitourinary: Negative for dysuria and hematuria.  Neurological: Negative for weakness and headaches.  Psychiatric/Behavioral: Negative for agitation and behavioral problems.       Objective:   Physical Exam Vitals signs reviewed.  Constitutional:      General: He is not in acute distress. HENT:     Head: Normocephalic and atraumatic.  Eyes:     General:        Right eye: No discharge.        Left eye: No discharge.  Neck:     Trachea: No tracheal deviation.  Cardiovascular:     Rate and Rhythm: Normal rate and regular rhythm.     Heart sounds: Normal heart sounds. No murmur.  Pulmonary:     Effort: Pulmonary effort is normal. No respiratory distress.     Breath sounds: Normal breath sounds.  Lymphadenopathy:     Cervical: No cervical adenopathy.  Skin:    General: Skin is warm and dry.  Neurological:     Mental Status: He is alert.     Coordination: Coordination normal.  Psychiatric:        Behavior: Behavior normal.     Patient being followed by pulmonary doctor as well as cardiologist states he would like to maneuver toward just once a year blood testing with [place] in a wellness checkup   His left foot is show signs of bruising that sends forms of healing also shows some swelling some redness localized cellulitis I doubt underlying fracture    Assessment & Plan:  Insect bite Cellulitis Warm compresses antibiotics Follow-up if progressive troubles or worse  Other chronic health issues are stable he sees specialist for this he will do lab work and a follow-up in September for a wellness

## 2018-07-16 ENCOUNTER — Other Ambulatory Visit: Payer: Self-pay | Admitting: Family Medicine

## 2018-07-31 DIAGNOSIS — I10 Essential (primary) hypertension: Secondary | ICD-10-CM | POA: Diagnosis not present

## 2018-07-31 DIAGNOSIS — J449 Chronic obstructive pulmonary disease, unspecified: Secondary | ICD-10-CM | POA: Diagnosis not present

## 2018-07-31 DIAGNOSIS — J301 Allergic rhinitis due to pollen: Secondary | ICD-10-CM | POA: Diagnosis not present

## 2018-07-31 DIAGNOSIS — I2510 Atherosclerotic heart disease of native coronary artery without angina pectoris: Secondary | ICD-10-CM | POA: Diagnosis not present

## 2018-08-03 ENCOUNTER — Ambulatory Visit: Payer: Medicare Other | Admitting: Family Medicine

## 2018-08-30 ENCOUNTER — Other Ambulatory Visit: Payer: Self-pay | Admitting: Family Medicine

## 2018-08-30 NOTE — Telephone Encounter (Signed)
Recommend chronic annual wellness visit for this fall may have 1 refill

## 2018-09-07 ENCOUNTER — Telehealth: Payer: Self-pay | Admitting: Family Medicine

## 2018-09-07 DIAGNOSIS — Z125 Encounter for screening for malignant neoplasm of prostate: Secondary | ICD-10-CM

## 2018-09-07 DIAGNOSIS — E785 Hyperlipidemia, unspecified: Secondary | ICD-10-CM

## 2018-09-07 DIAGNOSIS — I10 Essential (primary) hypertension: Secondary | ICD-10-CM

## 2018-09-07 DIAGNOSIS — Z0000 Encounter for general adult medical examination without abnormal findings: Secondary | ICD-10-CM

## 2018-09-07 DIAGNOSIS — Z79899 Other long term (current) drug therapy: Secondary | ICD-10-CM

## 2018-09-07 NOTE — Telephone Encounter (Signed)
Patient needing labs for physical in september

## 2018-09-07 NOTE — Telephone Encounter (Signed)
Orders put in. Pt notified.  

## 2018-09-07 NOTE — Telephone Encounter (Signed)
Lipid, liver, metabolic 7, PSA 

## 2018-09-07 NOTE — Telephone Encounter (Signed)
Last labs 02/2018: Lipid and CBC

## 2018-09-14 ENCOUNTER — Ambulatory Visit: Payer: Medicare Other | Admitting: Cardiovascular Disease

## 2018-09-24 ENCOUNTER — Ambulatory Visit: Payer: Medicare Other | Admitting: Cardiovascular Disease

## 2018-10-15 ENCOUNTER — Other Ambulatory Visit: Payer: Self-pay | Admitting: Family Medicine

## 2018-10-16 NOTE — Telephone Encounter (Signed)
Needs virtual visit this fall may have refill

## 2018-10-17 NOTE — Telephone Encounter (Signed)
Please schedule and then route back to nurses 

## 2018-10-17 NOTE — Telephone Encounter (Signed)
Patient has appointment on 9/25 for physical

## 2018-10-24 DIAGNOSIS — Z79899 Other long term (current) drug therapy: Secondary | ICD-10-CM | POA: Diagnosis not present

## 2018-10-24 DIAGNOSIS — E785 Hyperlipidemia, unspecified: Secondary | ICD-10-CM | POA: Diagnosis not present

## 2018-10-24 DIAGNOSIS — Z125 Encounter for screening for malignant neoplasm of prostate: Secondary | ICD-10-CM | POA: Diagnosis not present

## 2018-10-24 DIAGNOSIS — I10 Essential (primary) hypertension: Secondary | ICD-10-CM | POA: Diagnosis not present

## 2018-10-24 DIAGNOSIS — Z0000 Encounter for general adult medical examination without abnormal findings: Secondary | ICD-10-CM | POA: Diagnosis not present

## 2018-10-30 LAB — BASIC METABOLIC PANEL
BUN/Creatinine Ratio: 13 (ref 10–24)
BUN: 9 mg/dL (ref 8–27)
CO2: 27 mmol/L (ref 20–29)
Calcium: 10 mg/dL (ref 8.6–10.2)
Chloride: 97 mmol/L (ref 96–106)
Creatinine, Ser: 0.69 mg/dL — ABNORMAL LOW (ref 0.76–1.27)
GFR calc Af Amer: 114 mL/min/{1.73_m2} (ref >59)
GFR calc non Af Amer: 99 mL/min/{1.73_m2} (ref >59)
Glucose: 99 mg/dL (ref 65–99)
Potassium: 4.9 mmol/L (ref 3.5–5.2)
Sodium: 140 mmol/L (ref 134–144)

## 2018-10-30 LAB — LIPID PANEL
Chol/HDL Ratio: 3 ratio (ref 0.0–5.0)
Cholesterol, Total: 203 mg/dL — ABNORMAL HIGH (ref 100–199)
HDL: 68 mg/dL (ref >39)
LDL Chol Calc (NIH): 109 mg/dL — ABNORMAL HIGH (ref 0–99)
Triglycerides: 150 mg/dL — ABNORMAL HIGH (ref 0–149)
VLDL Cholesterol Cal: 26 mg/dL (ref 5–40)

## 2018-10-30 LAB — HEPATIC FUNCTION PANEL
ALT: 24 IU/L (ref 0–44)
AST: 30 IU/L (ref 0–40)
Albumin: 4.7 g/dL (ref 3.8–4.8)
Alkaline Phosphatase: 60 IU/L (ref 39–117)
Bilirubin Total: 0.5 mg/dL (ref 0.0–1.2)
Bilirubin, Direct: 0.17 mg/dL (ref 0.00–0.40)
Total Protein: 7.2 g/dL (ref 6.0–8.5)

## 2018-10-30 LAB — PSA: Prostate Specific Ag, Serum: 4 ng/mL (ref 0.0–4.0)

## 2018-10-31 ENCOUNTER — Ambulatory Visit: Payer: Medicare Other | Admitting: Family Medicine

## 2018-11-01 ENCOUNTER — Ambulatory Visit: Payer: Medicare Other | Admitting: Family Medicine

## 2018-11-02 ENCOUNTER — Other Ambulatory Visit: Payer: Self-pay

## 2018-11-02 ENCOUNTER — Ambulatory Visit (INDEPENDENT_AMBULATORY_CARE_PROVIDER_SITE_OTHER): Payer: Medicare Other | Admitting: Family Medicine

## 2018-11-02 ENCOUNTER — Encounter: Payer: Self-pay | Admitting: Family Medicine

## 2018-11-02 VITALS — BP 130/74 | Temp 97.7°F | Ht 69.0 in | Wt 190.4 lb

## 2018-11-02 DIAGNOSIS — J449 Chronic obstructive pulmonary disease, unspecified: Secondary | ICD-10-CM | POA: Diagnosis not present

## 2018-11-02 DIAGNOSIS — I480 Paroxysmal atrial fibrillation: Secondary | ICD-10-CM | POA: Diagnosis not present

## 2018-11-02 DIAGNOSIS — Z125 Encounter for screening for malignant neoplasm of prostate: Secondary | ICD-10-CM

## 2018-11-02 DIAGNOSIS — E785 Hyperlipidemia, unspecified: Secondary | ICD-10-CM

## 2018-11-02 MED ORDER — SHINGRIX 50 MCG/0.5ML IM SUSR: 0.5000 mL | Freq: Once | INTRAMUSCULAR | 1 refills | Status: AC

## 2018-11-02 MED ORDER — DILTIAZEM HCL 60 MG PO TABS: ORAL_TABLET | ORAL | 1 refills | Status: DC

## 2018-11-02 MED ORDER — ROSUVASTATIN CALCIUM 40 MG PO TABS: 40.0000 mg | ORAL_TABLET | Freq: Every day | ORAL | 1 refills | Status: DC

## 2018-11-02 NOTE — Progress Notes (Signed)
   Subjective:    Patient ID: [name], male    DOB: 1950-08-18, 68 y.o.   MRN: [id number]  Hypertension This is a chronic problem. The current episode started more than 1 year ago. Pertinent negatives include no chest pain, headaches or shortness of breath. Risk factors for coronary artery disease include dyslipidemia. Treatments tried: cardizem. There are no compliance problems.    Discuss labs Patient has underlying COPD uses inhaler stays away from smoking tries to stay active  Has atrial fibrillation denies any chest tightness pressure pain shortness of breath and is compliant with medicines  Review of Systems  Constitutional: Negative for diaphoresis and fatigue.  HENT: Negative for congestion and rhinorrhea.   Respiratory: Negative for cough, chest tightness and shortness of breath.   Cardiovascular: Negative for chest pain and leg swelling.  Gastrointestinal: Negative for abdominal pain and diarrhea.  Skin: Negative for color change and rash.  Neurological: Negative for dizziness and headaches.  Psychiatric/Behavioral: Negative for behavioral problems and confusion.       Objective:   Physical Exam Vitals signs reviewed.  Constitutional:      General: He is not in acute distress. HENT:     Head: Normocephalic and atraumatic.  Eyes:     General:        Right eye: No discharge.        Left eye: No discharge.  Neck:     Trachea: No tracheal deviation.  Cardiovascular:     Rate and Rhythm: Normal rate and regular rhythm.     Heart sounds: Normal heart sounds. No murmur.  Pulmonary:     Effort: Pulmonary effort is normal. No respiratory distress.     Breath sounds: Normal breath sounds.  Lymphadenopathy:     Cervical: No cervical adenopathy.  Skin:    General: Skin is warm and dry.  Neurological:     Mental Status: He is alert.     Coordination: Coordination normal.  Psychiatric:        Behavior: Behavior normal.           Assessment & Plan:   The patient is too early for his wellness exam he defers this to early spring does not want to come back later this fall  1. Paroxysmal atrial fibrillation (HCC) He has atrial fibrillation he is on blood thinners no bleeding issues  2. Chronic obstructive pulmonary disease, unspecified COPD type ([place]) He has severe COPD on inhalers doing a good job with this staying away from smoking  3. Hyperlipidemia, unspecified hyperlipidemia type Hyperlipidemia he does take his medication recent lab work was good here repeat lab work again in 6 months after adjustment of his medicine to Crestor if he has any side effects he will let us know - Lipid panel - Hepatic function panel  4. Screening for prostate cancer His PSA is now at 4 I offered a urology consultation he would like to defer he will repeat a PSA in approximately 6 months - PSA

## 2018-11-12 ENCOUNTER — Ambulatory Visit (INDEPENDENT_AMBULATORY_CARE_PROVIDER_SITE_OTHER): Payer: Medicare Other | Admitting: Cardiovascular Disease

## 2018-11-12 ENCOUNTER — Other Ambulatory Visit: Payer: Self-pay

## 2018-11-12 ENCOUNTER — Encounter: Payer: Self-pay | Admitting: Cardiovascular Disease

## 2018-11-12 VITALS — BP 148/78 | HR 74 | Ht 69.0 in | Wt 192.0 lb

## 2018-11-12 DIAGNOSIS — I714 Abdominal aortic aneurysm, without rupture, unspecified: Secondary | ICD-10-CM

## 2018-11-12 DIAGNOSIS — I25708 Atherosclerosis of coronary artery bypass graft(s), unspecified, with other forms of angina pectoris: Secondary | ICD-10-CM | POA: Diagnosis not present

## 2018-11-12 DIAGNOSIS — Z952 Presence of prosthetic heart valve: Secondary | ICD-10-CM | POA: Diagnosis not present

## 2018-11-12 DIAGNOSIS — I480 Paroxysmal atrial fibrillation: Secondary | ICD-10-CM | POA: Diagnosis not present

## 2018-11-12 DIAGNOSIS — E785 Hyperlipidemia, unspecified: Secondary | ICD-10-CM | POA: Diagnosis not present

## 2018-11-12 DIAGNOSIS — I10 Essential (primary) hypertension: Secondary | ICD-10-CM

## 2018-11-12 NOTE — Progress Notes (Signed)
SUBJECTIVE: The patient presents for follow-up of coronary artery disease. He underwent 2 vessel CABG with LIMA to the LAD and SVG to the circumflex on 09/06/16. He also went aortic valve replacement with a 23 mmMagnaEase pericardial tissue valve.  He developed postoperative atrial fibrillation and fluttergoing out to postoperative day12and was started on amiodarone.  Echocardiogram 09/14/16: Vigorous left ventricular systolic function, LVEF 65-70%, normal regional wall motion, normally functioning 23 mmMagnaEase pericardial bioprosthesis. The left atrium and right atrium and right ventricle were mildly dilated.  He underwent endovascular stent graft repair of an infrarenal abdominal aortic aneurysm with a Gore Excluder stent graft November 2019.  ECG performed today which I personally view demonstrates sinus rhythm with old anteroseptal infarct pattern.  He denies chest pain.  Chronic exertional dyspnea from COPD is stable.  He denies palpitations and leg swelling.  His diet has not changed and he and his wife try to eat very healthy.  He has not exercised as much due to the pandemic.    Review of Systems: As per "subjective", otherwise negative.  Allergies  Allergen Reactions   Ciprofloxacin     Artificial heart valve    Current Outpatient Medications  Medication Sig Dispense Refill   acetaminophen (TYLENOL) 500 MG tablet Take 500 mg by mouth every 6 (six) hours as needed for moderate pain.      aspirin 81 MG tablet Take 81 mg by mouth at bedtime.      diltiazem (CARDIZEM) 60 MG tablet TAKE 1 TABLET BY MOUTH TWICE DAILY 180 tablet 1   ELIQUIS 5 MG TABS tablet TAKE 1 TABLET BY MOUTH TWICE DAILY 60 tablet 6   fexofenadine (ALLEGRA) 180 MG tablet Take 180 mg by mouth daily.     fluticasone (FLONASE) 50 MCG/ACT nasal spray Place 2 sprays into both nostrils daily. 16 g 5   lisinopril (PRINIVIL,ZESTRIL) 20 MG tablet TAKE 1 TABLET DAILY (DOSE  INCREASED 02/01/2017)  90 tablet 3   Multiple Vitamin (MULTIVITAMIN) tablet Take 1 tablet by mouth daily.     rosuvastatin (CRESTOR) 40 MG tablet Take 1 tablet (40 mg total) by mouth daily. 90 tablet 1   [name] ELLIPTA 100-62.5-25 MCG/INH AEPB Inhale 1 puff into the lungs daily.  12   VENTOLIN HFA 108 (90 Base) MCG/ACT inhaler INHALE 2 PUFFS BY MOUTH EVERY 4 HOURS AS NEEDED (Patient taking differently: Inhale 2 puffs into the lungs every 4 (four) hours as needed for wheezing or shortness of breath. ) 18 g 2   vitamin C (ASCORBIC ACID) 500 MG tablet Take 500 mg by mouth daily.      ferrous sulfate 325 (65 FE) MG tablet Take 325 mg by mouth daily with breakfast.     No current facility-administered medications for this visit.     Past Medical History:  Diagnosis Date   AAA (abdominal aortic aneurysm) (HCC)    Arthritis    COPD (chronic obstructive pulmonary disease) (HCC)    Coronary artery disease    Dyspnea    over exerting self   Fatty liver    Hyperlipidemia    Hypertension    Myocardial infarction (HCC)    Severe aortic stenosis 08/29/2016   White coat hypertension     Past Surgical History:  Procedure Laterality Date   ABDOMINAL AORTIC ENDOVASCULAR STENT GRAFT N/A 12/11/2017   Procedure: ABDOMINAL AORTIC ENDOVASCULAR STENT GRAFT;  Surgeon: Sherren Kerns, MD;  Location: [facility] OR;  Service: Vascular;  Laterality: N/A;  ANKLE SURGERY Right 2009   AORTIC VALVE REPLACEMENT N/A 09/05/2016   Procedure: AORTIC VALVE REPLACEMENT (AVR) USING 23 MM MAGNA EASE PERICARDIAL TISSUE VALVE;  Surgeon: Grace Isaac, MD;  Location: [place];  Service: Open Heart Surgery;  Laterality: N/A;   APPENDECTOMY     CARDIAC CATHETERIZATION N/A 02/02/2016   Procedure: Left Heart Cath and Coronary Angiography;  Surgeon: Charolette Forward, MD;  Location: [place] [facility];  Service: Cardiovascular;  Laterality: N/A;   COLONOSCOPY  06/22/2010   [id number] rectal and right colon polyps removed remainder  rectum and colon appared normal   COLONOSCOPY WITH PROPOFOL N/A 05/15/2014   Procedure: COLONOSCOPY WITH PROPOFOL;  Surgeon: Daneil Dolin, MD;  Location: [facility];  Service: Endoscopy;  Laterality: N/A;  In cecum @ 0918, out @ 0937, withdrawal time 19 minutes   CORONARY ARTERY BYPASS GRAFT N/A 09/05/2016   Procedure: CORONARY ARTERY BYPASS GRAFTING (CABG) USING LIMA TO DISTAL LAD AND ENDOSCOPICALLY HARVESTED GREATER SAPHENOUS VEIN TO CIRC.;  Surgeon: Grace Isaac, MD;  Location: [place];  Service: Open Heart Surgery;  Laterality: N/A;   ENDOVEIN HARVEST OF GREATER SAPHENOUS VEIN Right 09/05/2016   Procedure: ENDOVEIN HARVEST OF GREATER SAPHENOUS VEIN;  Surgeon: Grace Isaac, MD;  Location: [place];  Service: Open Heart Surgery;  Laterality: Right;   POLYPECTOMY N/A 05/15/2014   Procedure: POLYPECTOMY;  Surgeon: Daneil Dolin, MD;  Location: [facility];  Service: Endoscopy;  Laterality: N/A;   RIGHT/LEFT HEART CATH AND CORONARY ANGIOGRAPHY N/A 08/29/2016   Procedure: Right/Left Heart Cath and Coronary Angiography;  Surgeon: Sherren Mocha, MD;  Location: [place] [facility];  Service: Cardiovascular;  Laterality: N/A;   TEE WITHOUT CARDIOVERSION N/A 09/05/2016   Procedure: TRANSESOPHAGEAL ECHOCARDIOGRAM (TEE);  Surgeon: Grace Isaac, MD;  Location: [place];  Service: Open Heart Surgery;  Laterality: N/A;    Social History   Socioeconomic History   Marital status: Married    Spouse name: Not on file   Number of children: Not on file   Years of education: Not on file   Highest education level: Not on file  Occupational History   Not on file  Social Needs   Financial resource strain: Not on file   Food insecurity    Worry: Not on file    Inability: Not on file   Transportation needs    Medical: Not on file    Non-medical: Not on file  Tobacco Use   Smoking status: Former Smoker    Packs/day: 1.50    Years: 40.00    Pack years: 60.00    Types: Cigarettes    Quit  date: 04/16/2002    Years since quitting: 16.5   Smokeless tobacco: Never Used  Substance and Sexual Activity   Alcohol use: Yes    Alcohol/week: 5.0 standard drinks    Types: 5 Cans of beer per week    Comment: per pt   Drug use: No   Sexual activity: Never    Birth control/protection: None  Lifestyle   Physical activity    Days per week: Not on file    Minutes per session: Not on file   Stress: Not on file  Relationships   Social connections    Talks on phone: Not on file    Gets together: Not on file    Attends religious service: Not on file    Active member of club or organization: Not on file    Attends meetings of clubs  or organizations: Not on file    Relationship status: Not on file   Intimate partner violence    Fear of current or ex partner: Not on file    Emotionally abused: Not on file    Physically abused: Not on file    Forced sexual activity: Not on file  Other Topics Concern   Not on file  Social History Narrative   [facility] (09/13/16):   Previously worked in a [employer]. Also worked Production designer, theatre/television/filmmanufacturing ostomy bags. Currently has an outdoor cat and no indoor pets. No bird or mold exposure.     Vitals:   11/12/18 1557  BP: (!) 148/78  Pulse: 74  SpO2: (!) 89%  Weight: 192 lb (87.1 kg)  Height: 5\' 9"  (1.753 m)    Wt Readings from Last 3 Encounters:  11/12/18 192 lb (87.1 kg)  11/02/18 190 lb 6.4 oz (86.4 kg)  07/10/18 189 lb (85.7 kg)     PHYSICAL EXAM General: NAD HEENT: Normal. Neck: No JVD, no thyromegaly. Lungs: Diminished sounds throughout without crackles or wheezes. CV: Regular rate and rhythm, normal S1/S2, no S3/S4, no murmur. No pretibial or periankle edema.  No carotid bruit.   Abdomen: Soft, nontender, no distention.  Neurologic: Alert and oriented.  Psych: Normal affect. Skin: Normal. Musculoskeletal: No gross deformities.      Labs: Lab Results  Component Value Date/Time   K 4.9 10/24/2018 08:27 AM   BUN 9  10/24/2018 08:27 AM   CREATININE 0.69 (L) 10/24/2018 08:27 AM   CREATININE 0.64 (L) 11/13/2017 08:50 AM   ALT 24 10/24/2018 08:27 AM   TSH 1.701 09/12/2016 01:19 PM   HGB 16.6 02/19/2018 08:38 AM   HGB 15.8 06/13/2015 09:06 AM     Lipids: Lab Results  Component Value Date/Time   LDLCALC 109 (H) 10/24/2018 08:27 AM   LDLCALC 78 02/19/2018 08:38 AM   CHOL 203 (H) 10/24/2018 08:27 AM   TRIG 150 (H) 10/24/2018 08:27 AM   HDL 68 10/24/2018 08:27 AM       ASSESSMENT AND PLAN:  1. Coronary artery disease status post two-vessel CABG: Symptomatically stable. Continue aspirin and statin.   2. Status post aortic valve replacement with a bioprosthetic valve: Stable. Functioning normally by most recent echocardiogram detailed above.  3. Paroxysmal atrial fibrillation and flutter: Symptomatically stable without palpitations. As he had postoperative atrial fibrillation out today 12, and due to his concomitant lung disease, I am concerned about the possibility of recurrence of atrial fibrillation. For this reason, I will continue anticoagulation with apixaban 5 mg twice daily. Continue long-acting diltiazem.  4. Hypertension: Mildly elevated. No changes totherapy.  5. Hyperlipidemia: Continue rosuvastatin 40 mg.  LDL elevated at 109 on 10/24/2018.  He was switched to rosuvastatin by his PCP on 11/03/2018. His diet has not changed and he and his wife try to eat very healthy.  He has not exercised as much due to the pandemic.  6.  Abdominal aortic aneurysm: He underwent endovascular stent graft repair of an infrarenal abdominal aortic aneurysm with a Gore Excluder stent graft November 2019.    Disposition: Follow up 1 year   Prentice DockerSuresh Italy Warriner, M.D., F.A.C.C.

## 2018-11-12 NOTE — Patient Instructions (Signed)

## 2018-11-16 ENCOUNTER — Other Ambulatory Visit: Payer: Self-pay | Admitting: Family Medicine

## 2018-12-06 ENCOUNTER — Encounter: Payer: Self-pay | Admitting: Vascular Surgery

## 2018-12-06 ENCOUNTER — Telehealth: Payer: Self-pay | Admitting: Vascular Surgery

## 2018-12-06 NOTE — Telephone Encounter (Signed)
Called pt 3x to schedule CT scan. Will send letter. °

## 2018-12-10 ENCOUNTER — Encounter: Payer: Self-pay | Admitting: Vascular Surgery

## 2018-12-11 ENCOUNTER — Other Ambulatory Visit: Payer: Self-pay | Admitting: Vascular Surgery

## 2018-12-11 DIAGNOSIS — I714 Abdominal aortic aneurysm, without rupture, unspecified: Secondary | ICD-10-CM

## 2018-12-26 ENCOUNTER — Ambulatory Visit (HOSPITAL_COMMUNITY)
Admission: RE | Admit: 2018-12-26 | Discharge: 2018-12-26 | Disposition: A | Discharge status: Home / Self Care | Payer: Medicare Other | Source: Ambulatory Visit | Attending: Vascular Surgery | Admitting: Vascular Surgery

## 2018-12-26 ENCOUNTER — Other Ambulatory Visit: Payer: Self-pay

## 2018-12-26 ENCOUNTER — Encounter (HOSPITAL_COMMUNITY): Payer: Self-pay

## 2018-12-26 DIAGNOSIS — I714 Abdominal aortic aneurysm, without rupture, unspecified: Secondary | ICD-10-CM

## 2018-12-26 LAB — POCT I-STAT CREATININE: Creatinine, Ser: 0.7 mg/dL (ref 0.61–1.24)

## 2018-12-26 MED ORDER — IOHEXOL 350 MG/ML SOLN
80.0000 mL | Freq: Once | INTRAVENOUS | Status: AC | PRN
  Administered 2018-12-26: 80 mL via INTRAVENOUS

## 2019-01-02 ENCOUNTER — Other Ambulatory Visit: Payer: Self-pay

## 2019-01-03 ENCOUNTER — Other Ambulatory Visit: Payer: Self-pay | Admitting: Cardiovascular Disease

## 2019-01-17 ENCOUNTER — Encounter: Payer: Self-pay | Admitting: Vascular Surgery

## 2019-01-17 ENCOUNTER — Other Ambulatory Visit: Payer: Self-pay

## 2019-01-17 ENCOUNTER — Ambulatory Visit (INDEPENDENT_AMBULATORY_CARE_PROVIDER_SITE_OTHER): Payer: Medicare Other | Admitting: Vascular Surgery

## 2019-01-17 VITALS — BP 113/69 | HR 85 | Temp 97.8°F | Resp 20 | Ht 69.0 in | Wt 196.0 lb

## 2019-01-17 DIAGNOSIS — I714 Abdominal aortic aneurysm, without rupture, unspecified: Secondary | ICD-10-CM

## 2019-01-17 DIAGNOSIS — I25708 Atherosclerosis of coronary artery bypass graft(s), unspecified, with other forms of angina pectoris: Secondary | ICD-10-CM

## 2019-01-17 NOTE — Progress Notes (Signed)
Patient is a 68 year old male who returns for follow-up today.  He underwent endovascular stent graft repair of an infrarenal aortic aneurysm with a Samuel Jester November 2019.  He no longer has any numbness in his legs.  He has returned to normal activities.  He does have moderate COPD but states he is able to compensate for this.  He has no abdominal or back pain.  He is on Eliquis and aspirin.  He is on Eliquis for paroxysmal atrial fibrillation.  He is also on a statin.  Review of systems: He has no chest pain.  He has no shortness of breath.  Past Medical History:  Diagnosis Date  . AAA (abdominal aortic aneurysm) ([place])   . Arthritis   . COPD (chronic obstructive pulmonary disease) ([place])   . Coronary artery disease   . Dyspnea    over exerting self  . Fatty liver   . Hyperlipidemia   . Hypertension   . Myocardial infarction ([place])   . Severe aortic stenosis 08/29/2016  . White coat hypertension     Current Outpatient Medications on File Prior to Visit  Medication Sig Dispense Refill  . acetaminophen (TYLENOL) 500 MG tablet Take 500 mg by mouth every 6 (six) hours as needed for moderate pain.     [name] aspirin 81 MG tablet Take 81 mg by mouth at bedtime.     [name] diltiazem (CARDIZEM) 60 MG tablet TAKE 1 TABLET BY MOUTH TWICE DAILY 180 tablet 1  . ELIQUIS 5 MG TABS tablet TAKE 1 TABLET BY MOUTH TWICE DAILY 60 tablet 6  . fexofenadine (ALLEGRA) 180 MG tablet Take 180 mg by mouth daily.    . fluticasone (FLONASE) 50 MCG/ACT nasal spray Place 2 sprays into both nostrils daily. 16 g 5  . lisinopril (PRINIVIL,ZESTRIL) 20 MG tablet TAKE 1 TABLET DAILY (DOSE  INCREASED 02/01/2017) 90 tablet 3  . Multiple Vitamin (MULTIVITAMIN) tablet Take 1 tablet by mouth daily.    . rosuvastatin (CRESTOR) 40 MG tablet Take 1 tablet (40 mg total) by mouth daily. 90 tablet 1  . [name] ELLIPTA 100-62.5-25 MCG/INH AEPB Inhale 1 puff into the lungs daily.  12  . VENTOLIN HFA 108 (90 Base) MCG/ACT inhaler INHALE 2  PUFFS BY MOUTH EVERY 4 HOURS AS NEEDED (Patient taking differently: Inhale 2 puffs into the lungs every 4 (four) hours as needed for wheezing or shortness of breath. ) 18 g 2  . vitamin C (ASCORBIC ACID) 500 MG tablet Take 500 mg by mouth daily.      No current facility-administered medications on file prior to visit.   Physical exam:  Vitals:   01/17/19 0911  BP: 113/69  Pulse: 85  Resp: 20  Temp: 97.8 F (36.6 C)  SpO2: 95%  Weight: 196 lb (88.9 kg)  Height: 5\' 9"  (1.753 m)    Abdomen: Soft nontender nondistended no pulsatile mass 2+ femoral pulses  Cardiac: Regular rate and rhythm  Data: Patient had a CT angiogram of the abdomen and pelvis last week which shows no evidence of endoleak and aneurysm diameter is now 4.8 cm decreased from 5.2 cm preoperatively.  I reviewed these images.  Assessment: Doing well status post stent graft repair of abdominal aortic aneurysm  Plan: The patient will follow-up ultrasound and be seen in our APP clinic in 1 year.  Ruta Hinds, MD [facility] Office: [phone]

## 2019-02-05 ENCOUNTER — Other Ambulatory Visit: Payer: Self-pay | Admitting: *Deleted

## 2019-02-05 DIAGNOSIS — I714 Abdominal aortic aneurysm, without rupture, unspecified: Secondary | ICD-10-CM

## 2019-03-13 ENCOUNTER — Other Ambulatory Visit: Payer: Self-pay | Admitting: *Deleted

## 2019-03-13 DIAGNOSIS — Z79899 Other long term (current) drug therapy: Secondary | ICD-10-CM

## 2019-03-13 DIAGNOSIS — E785 Hyperlipidemia, unspecified: Secondary | ICD-10-CM

## 2019-03-13 DIAGNOSIS — Z125 Encounter for screening for malignant neoplasm of prostate: Secondary | ICD-10-CM

## 2019-03-14 DIAGNOSIS — Z23 Encounter for immunization: Secondary | ICD-10-CM | POA: Diagnosis not present

## 2019-04-09 DIAGNOSIS — E785 Hyperlipidemia, unspecified: Secondary | ICD-10-CM | POA: Diagnosis not present

## 2019-04-09 DIAGNOSIS — Z125 Encounter for screening for malignant neoplasm of prostate: Secondary | ICD-10-CM | POA: Diagnosis not present

## 2019-04-09 DIAGNOSIS — Z79899 Other long term (current) drug therapy: Secondary | ICD-10-CM | POA: Diagnosis not present

## 2019-04-10 LAB — HEPATIC FUNCTION PANEL
ALT: 31 IU/L (ref 0–44)
AST: 42 IU/L — ABNORMAL HIGH (ref 0–40)
Albumin: 4.6 g/dL (ref 3.8–4.8)
Alkaline Phosphatase: 61 IU/L (ref 39–117)
Bilirubin Total: 0.4 mg/dL (ref 0.0–1.2)
Bilirubin, Direct: 0.18 mg/dL (ref 0.00–0.40)
Total Protein: 7.3 g/dL (ref 6.0–8.5)

## 2019-04-10 LAB — LIPID PANEL
Chol/HDL Ratio: 3.3 ratio (ref 0.0–5.0)
Cholesterol, Total: 204 mg/dL — ABNORMAL HIGH (ref 100–199)
HDL: 61 mg/dL (ref >39)
LDL Chol Calc (NIH): 97 mg/dL (ref 0–99)
Triglycerides: 273 mg/dL — ABNORMAL HIGH (ref 0–149)
VLDL Cholesterol Cal: 46 mg/dL — ABNORMAL HIGH (ref 5–40)

## 2019-04-10 LAB — PSA: Prostate Specific Ag, Serum: 2.4 ng/mL (ref 0.0–4.0)

## 2019-04-12 DIAGNOSIS — Z23 Encounter for immunization: Secondary | ICD-10-CM | POA: Diagnosis not present

## 2019-04-15 ENCOUNTER — Encounter: Payer: Self-pay | Admitting: Family Medicine

## 2019-04-15 ENCOUNTER — Ambulatory Visit (INDEPENDENT_AMBULATORY_CARE_PROVIDER_SITE_OTHER): Payer: Medicare Other | Admitting: Family Medicine

## 2019-04-15 ENCOUNTER — Other Ambulatory Visit: Payer: Self-pay | Admitting: Cardiovascular Disease

## 2019-04-15 ENCOUNTER — Other Ambulatory Visit: Payer: Self-pay

## 2019-04-15 VITALS — BP 130/80 | Temp 98.0°F | Ht 69.0 in | Wt 196.8 lb

## 2019-04-15 DIAGNOSIS — Z0000 Encounter for general adult medical examination without abnormal findings: Secondary | ICD-10-CM | POA: Diagnosis not present

## 2019-04-15 DIAGNOSIS — K760 Fatty (change of) liver, not elsewhere classified: Secondary | ICD-10-CM | POA: Diagnosis not present

## 2019-04-15 DIAGNOSIS — I480 Paroxysmal atrial fibrillation: Secondary | ICD-10-CM | POA: Diagnosis not present

## 2019-04-15 DIAGNOSIS — I714 Abdominal aortic aneurysm, without rupture, unspecified: Secondary | ICD-10-CM

## 2019-04-15 DIAGNOSIS — E785 Hyperlipidemia, unspecified: Secondary | ICD-10-CM

## 2019-04-15 DIAGNOSIS — R7303 Prediabetes: Secondary | ICD-10-CM

## 2019-04-15 DIAGNOSIS — J449 Chronic obstructive pulmonary disease, unspecified: Secondary | ICD-10-CM | POA: Diagnosis not present

## 2019-04-15 MED ORDER — ALBUTEROL SULFATE HFA 108 (90 BASE) MCG/ACT IN AERS: 2.0000 | INHALATION_SPRAY | RESPIRATORY_TRACT | 6 refills | Status: DC | PRN

## 2019-04-15 MED ORDER — ROSUVASTATIN CALCIUM 40 MG PO TABS: 40.0000 mg | ORAL_TABLET | Freq: Every day | ORAL | 1 refills | Status: DC

## 2019-04-15 MED ORDER — FLUTICASONE PROPIONATE 50 MCG/ACT NA SUSP: 2.0000 | Freq: Every day | NASAL | 5 refills | Status: DC

## 2019-04-15 MED ORDER — DILTIAZEM HCL 60 MG PO TABS: ORAL_TABLET | ORAL | 1 refills | Status: DC

## 2019-04-15 MED ORDER — TRELEGY ELLIPTA 100-62.5-25 MCG/INH IN AEPB: 1.0000 | INHALATION_SPRAY | Freq: Every day | RESPIRATORY_TRACT | 12 refills | Status: DC

## 2019-04-15 NOTE — Progress Notes (Signed)
Subjective:    Patient ID: [name], male    DOB: 02-02-1951, 69 y.o.   MRN: [id number]  HPI The patient comes in today for a wellness visit. Patient here for his annual wellness No falls Cognitive doing well passes cognitive screening test Denies being depressed  Fall Risk  04/15/2019 01/02/2019 12/04/2017 06/01/2015 12/01/2014  Falls in the past year? 0 0 No No No  Comment - Emmi Telephone Survey: data to providers prior to load - - -  Follow up Falls evaluation completed - - - -     A review of their health history was completed.  A review of medications was also completed.  Any needed refills; yes  Eating habits: fair  Falls/  MVA accidents in past few months: none  Regular exercise: not much lately  Specialist pt sees on regular basis: Dr Edmonia James and Dr Darrick Penna  Preventative health issues were discussed.  Results for orders placed or performed in visit on 03/13/19  Lipid panel  Result Value Ref Range   Cholesterol, Total 204 (H) 100 - 199 mg/dL   Triglycerides 630 (H) 0 - 149 mg/dL   HDL 61 >16 mg/dL   VLDL Cholesterol Cal 46 (H) 5 - 40 mg/dL   LDL Chol Calc (NIH) 97 0 - 99 mg/dL   Chol/HDL Ratio 3.3 0.0 - 5.0 ratio  Hepatic function panel  Result Value Ref Range   Total Protein 7.3 6.0 - 8.5 g/dL   Albumin 4.6 3.8 - 4.8 g/dL   Bilirubin Total 0.4 0.0 - 1.2 mg/dL   Bilirubin, Direct 0.10 0.00 - 0.40 mg/dL   Alkaline Phosphatase 61 39 - 117 IU/L   AST 42 (H) 0 - 40 IU/L   ALT 31 0 - 44 IU/L  PSA  Result Value Ref Range   Prostate Specific Ag, Serum 2.4 0.0 - 4.0 ng/mL    Additional concerns: none  Allergies as of 04/15/2019      Reactions   Ciprofloxacin    Artificial heart valve      Medication List       Accurate as of April 15, 2019  9:43 AM. If you have any questions, ask your nurse or doctor.        acetaminophen 500 MG tablet Commonly known as: TYLENOL Take 500 mg by mouth every 6 (six) hours as needed for moderate pain.     aspirin 81 MG tablet Take 81 mg by mouth at bedtime.   diltiazem 60 MG tablet Commonly known as: CARDIZEM TAKE 1 TABLET BY MOUTH TWICE DAILY   Eliquis 5 MG Tabs tablet Generic drug: apixaban TAKE 1 TABLET BY MOUTH TWICE DAILY   fexofenadine 180 MG tablet Commonly known as: ALLEGRA Take 180 mg by mouth daily.   fluticasone 50 MCG/ACT nasal spray Commonly known as: FLONASE Place 2 sprays into both nostrils daily.   lisinopril 20 MG tablet Commonly known as: ZESTRIL TAKE 1 TABLET DAILY (DOSE  INCREASED 02/01/2017)   multivitamin tablet Take 1 tablet by mouth daily.   rosuvastatin 40 MG tablet Commonly known as: Crestor Take 1 tablet (40 mg total) by mouth daily.   [name] Ellipta 100-62.5-25 MCG/INH Aepb Generic drug: Fluticasone-Umeclidin-Vilant Inhale 1 puff into the lungs daily.   Ventolin HFA 108 (90 Base) MCG/ACT inhaler Generic drug: albuterol INHALE 2 PUFFS BY MOUTH EVERY 4 HOURS AS NEEDED What changed: reasons to take this   vitamin C 500 MG tablet Commonly known as: ASCORBIC ACID Take 500 mg by  mouth daily.       Review of Systems  Constitutional: Negative for activity change, appetite change and fever.  HENT: Negative for congestion and rhinorrhea.   Eyes: Negative for discharge.  Respiratory: Negative for cough and wheezing.   Cardiovascular: Negative for chest pain.  Gastrointestinal: Negative for abdominal pain, blood in stool and vomiting.  Genitourinary: Negative for difficulty urinating and frequency.  Musculoskeletal: Negative for neck pain.  Skin: Negative for rash.  Allergic/Immunologic: Negative for environmental allergies and food allergies.  Neurological: Negative for weakness and headaches.  Psychiatric/Behavioral: Negative for agitation.       Objective:   Physical Exam Constitutional:      Appearance: He is well-developed.  HENT:     Head: Normocephalic and atraumatic.     Right Ear: External ear normal.     Left Ear:  External ear normal.     Nose: Nose normal.  Eyes:     Pupils: Pupils are equal, round, and reactive to light.  Neck:     Thyroid: No thyromegaly.  Cardiovascular:     Rate and Rhythm: Normal rate and regular rhythm.     Heart sounds: Normal heart sounds. No murmur.  Pulmonary:     Effort: Pulmonary effort is normal. No respiratory distress.     Breath sounds: Normal breath sounds. No wheezing.  Abdominal:     General: Bowel sounds are normal. There is no distension.     Palpations: Abdomen is soft. There is no mass.     Tenderness: There is no abdominal tenderness.  Genitourinary:    Penis: Normal.   Musculoskeletal:        General: Normal range of motion.     Cervical back: Normal range of motion and neck supple.  Lymphadenopathy:     Cervical: No cervical adenopathy.  Skin:    General: Skin is warm and dry.     Findings: No erythema.  Neurological:     Mental Status: He is alert.     Motor: No abnormal muscle tone.  Psychiatric:        Behavior: Behavior normal.        Judgment: Judgment normal.    Depression screen [facility] 2/9 04/15/2019 12/04/2017 06/02/2017 06/01/2015 12/01/2014  Decreased Interest 0 0 0 0 0  Down, Depressed, Hopeless 0 0 0 0 0  PHQ - 2 Score 0 0 0 0 0   Prostate exam normal       Assessment & Plan:  Adult wellness-complete.wellness physical was conducted today. Importance of diet and exercise were discussed in detail.  In addition to this a discussion regarding safety was also covered. We also reviewed over immunizations and gave recommendations regarding current immunization needed for age.  In addition to this additional areas were also touched on including: Preventative health exams needed:  Colonoscopy 2026  Patient was advised yearly wellness exam  1. AAA (abdominal aortic aneurysm) without rupture ([place]) He is followed by specialist on a yearly basis denies any abdominal pain currently  2. Paroxysmal atrial fibrillation (HCC) Rate under  good control takes anticoagulant to prevent stroke followed by cardiology  3. Chronic obstructive pulmonary disease, unspecified COPD type ([place]) Refills on his inhalers given today patient doing well overall with his COPD  4. Hyperlipidemia, unspecified hyperlipidemia type Cholesterol was reviewed with the patient as well as renewal of medicines follow-up 6 months  5. Prediabetes Minimize starches in diet stay physically active  Patient will follow up in 6 months Patient will notify  us if any flareups of COPD  Patient with fatty liver does well with diet.

## 2019-04-15 NOTE — Patient Instructions (Addendum)
  Thank you for coming for your annual wellness visit.  Please follow through on any advice that was given to you by today's visit. Remember to maintain compliance with your medications as discussed today.  Also remember it is important to eat a healthy diet and to stay physically active on a daily basis.  Please follow through with any testing or recommended followup office visits as was discussed today. You are due the following test coming up:  ALL- Colonoscopy-April 2026            Vaccines- Shingrix to do 1st week of April    Pneumococcal 23 nurse visit 1st week of May          Finally remembered that the annual wellness visit does not take the place of regularly scheduled office visits  chronic health problems such as hypertension/diabetes/cholesterol visits.

## 2019-04-16 IMAGING — DX DG CHEST 1V PORT
1 series · 1 of 1 positions shown · non-contrast
Comparison: Portable chest x-ray September 06, 2016

CLINICAL DATA: Status post aortic valve replacement and CABG 2 days
ago

EXAM:
PORTABLE CHEST 1 VIEW

[chest ap]
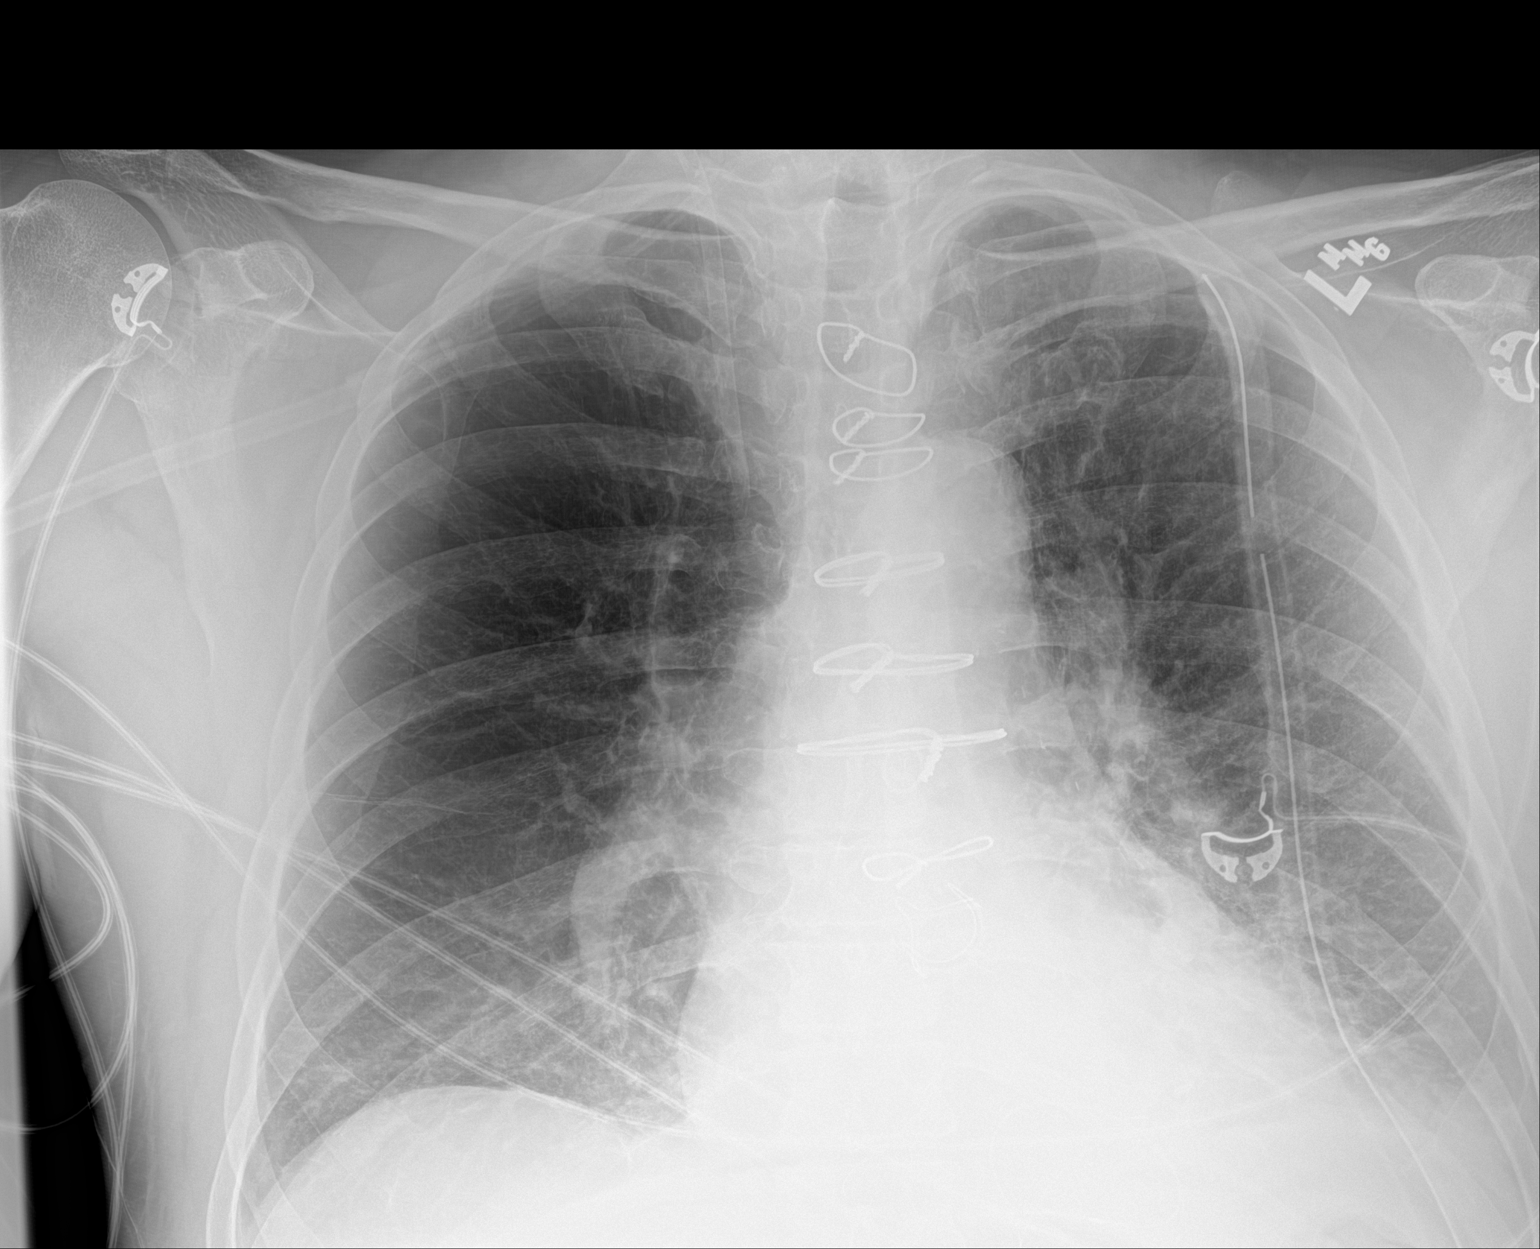

[1 of 1 positions shown; findings below may reference images not displayed]

FINDINGS: The lungs are adequately inflated. There is persistent increased
density at the left lung base. The interstitial markings of the left
lung remain increased. A tiny pleural line is noted in the left
pulmonary apex. The left chest tube tip projects just inferior to
the posterolateral aspect of the third rib. The heart is normal in
size. The pulmonary vascularity is normal. The Swan-Ganz catheter is
been removed. The right internal jugular Cordis sheath projects over
the proximal SVC.
IMPRESSION: Further interval decrease in interstitial edema and left lower lobe
atelectasis. A less than 5% apical pneumothorax on the left
persists. The left chest tube is in stable position.

## 2019-04-17 IMAGING — DX DG CHEST 1V PORT
1 series · 1 of 1 positions shown · non-contrast
Comparison: Chest x-ray from earlier same day.

CLINICAL DATA: RIGHT ARM PICC LINE INSERTION

EXAM:
PORTABLE CHEST 1 VIEW

[chest ap]
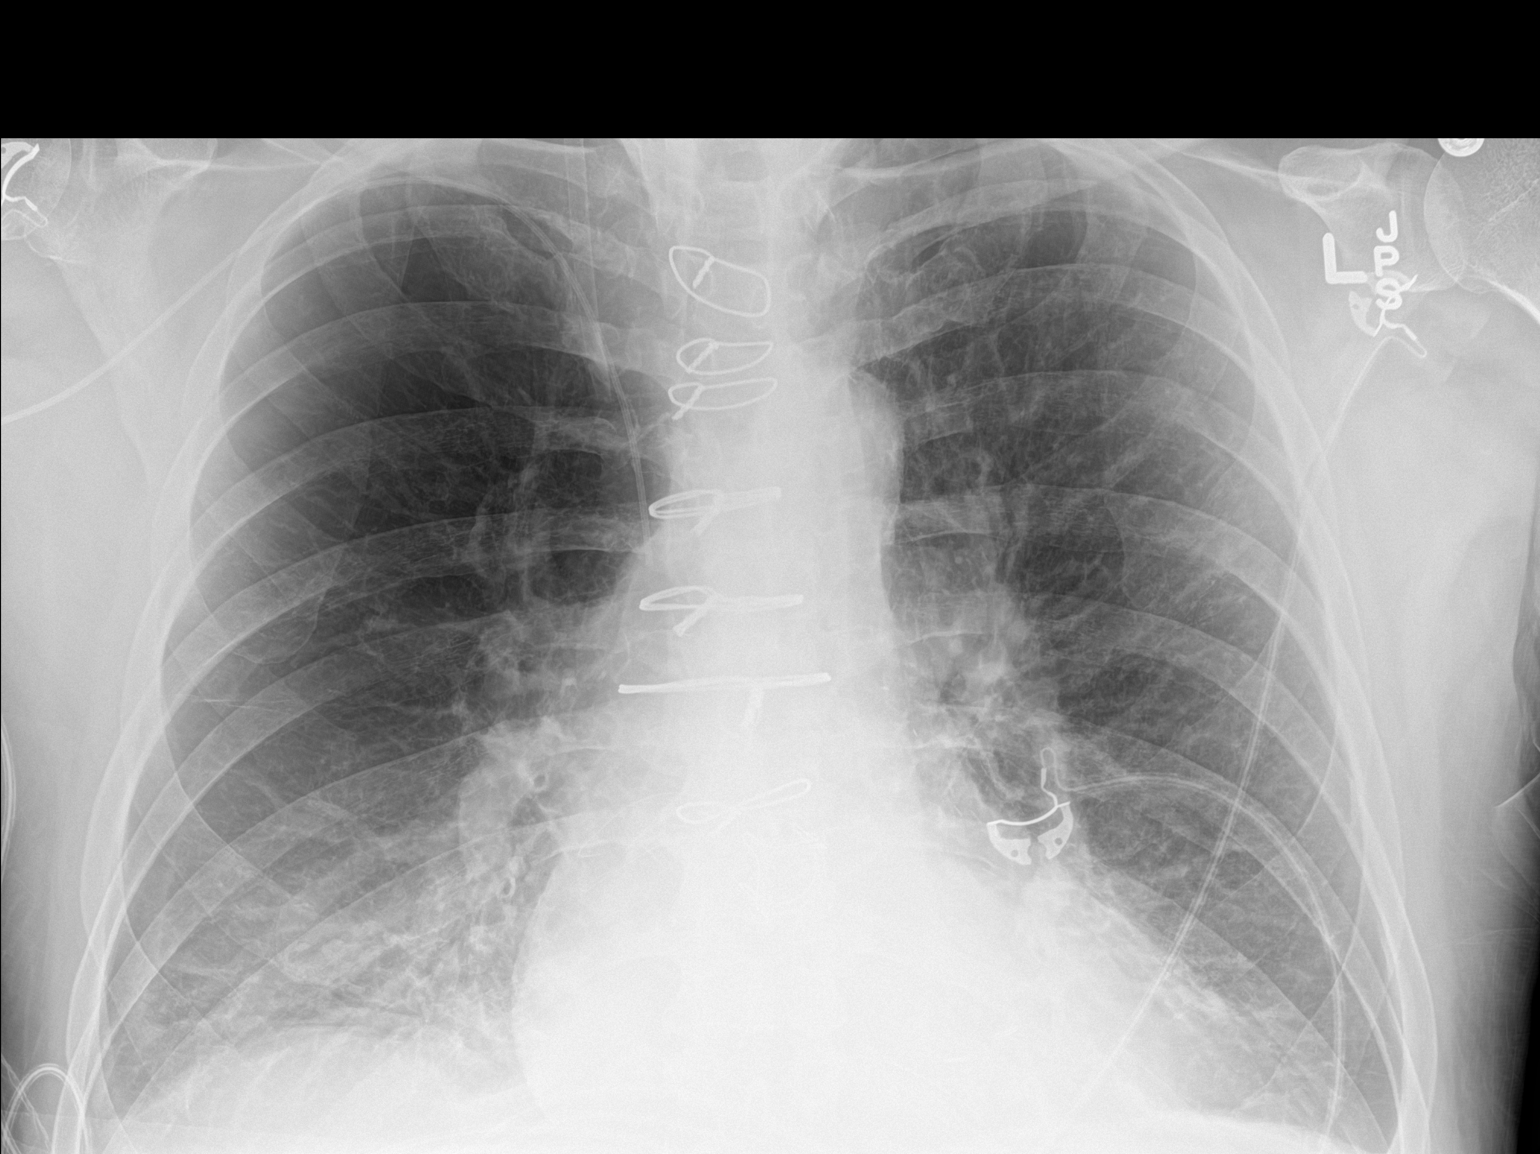

[1 of 1 positions shown; findings below may reference images not displayed]

FINDINGS: Interval PICC line placement, with tip adequately positioned at the
level of the mid/upper SVC.

Heart size and mediastinal contours stable. Continued central
pulmonary vascular congestion and probable bibasilar
edema/effusions, unchanged in the short-term interval. No new lung
findings.
IMPRESSION: PICC line is adequately positioned with tip at the level of the
mid/upper SVC. Could consider advancing approximately 5 cm for more
optimal radiographic positioning at the cavoatrial junction.

No other interval change.

## 2019-04-24 IMAGING — DX DG CHEST 1V PORT
1 series · 1 of 1 positions shown · non-contrast
Comparison: Multiple prior chest x-rays, most recently dated September 12, 2016.

CLINICAL DATA: COPD.

EXAM:
PORTABLE CHEST 1 VIEW

[chest ap]
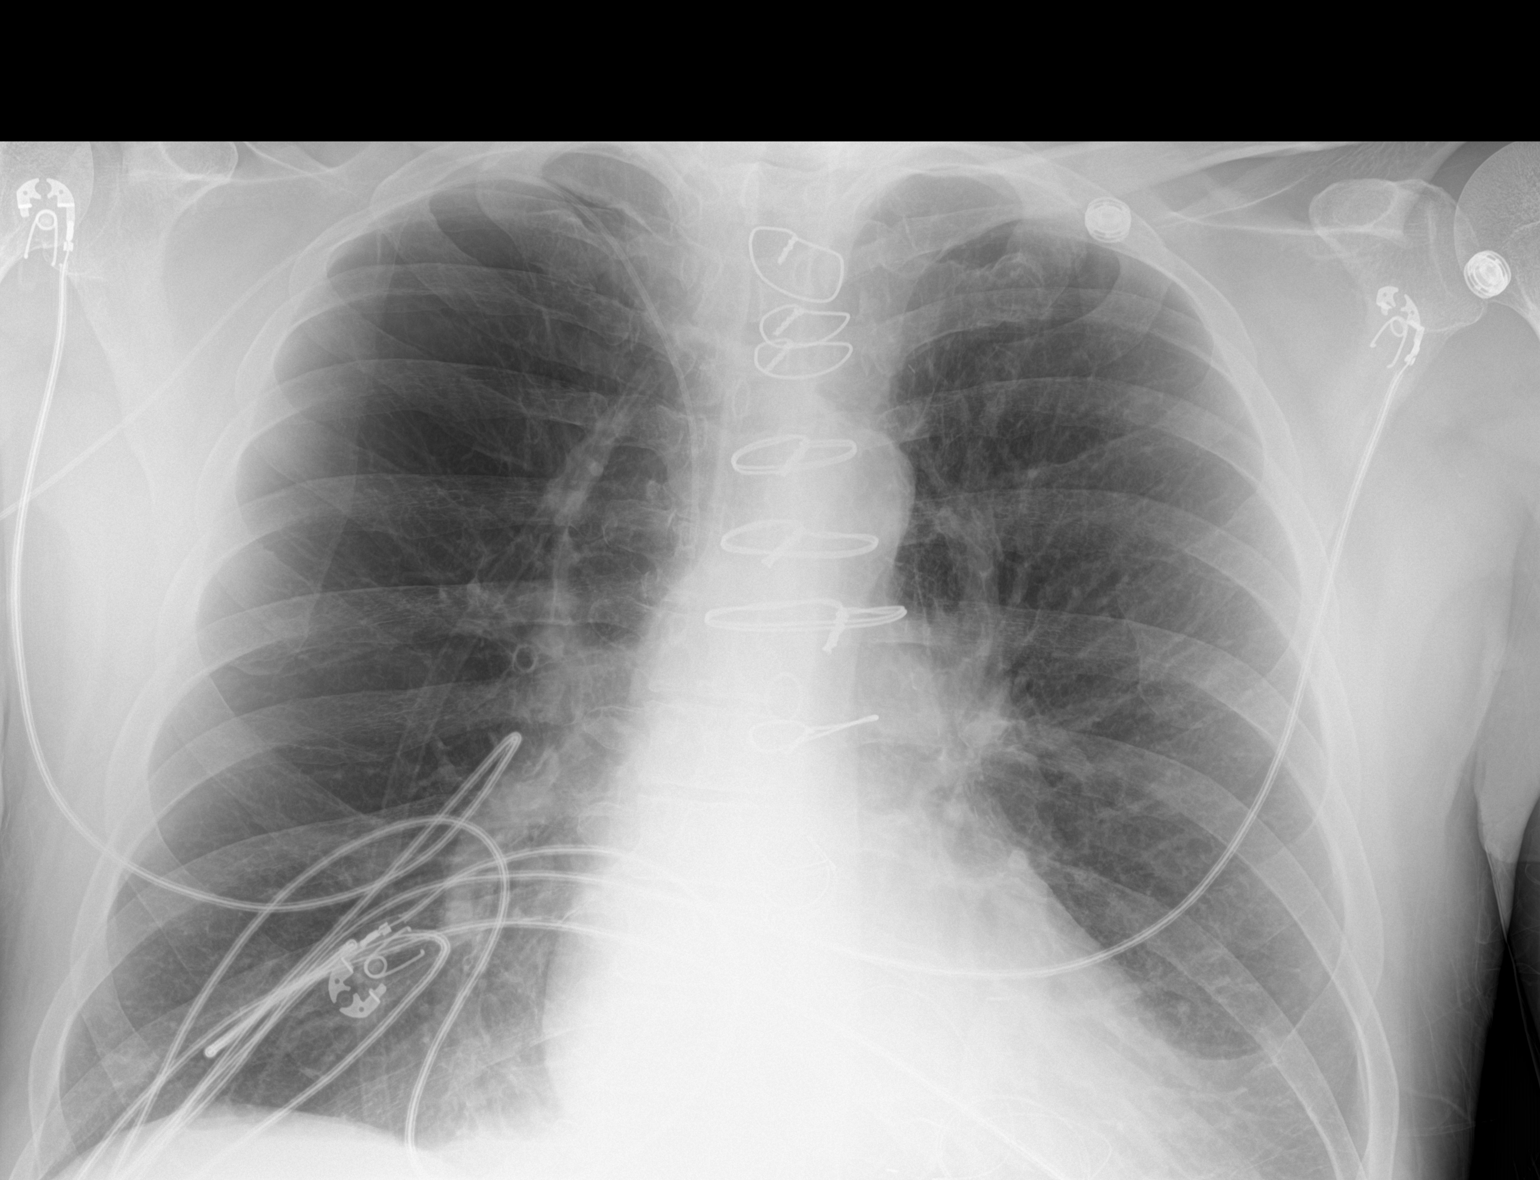

[1 of 1 positions shown; findings below may reference images not displayed]

FINDINGS: The left diaphragm and costophrenic angle are excluded from the
field-of-view.

Right PICC catheter with tip in the distal SVC. Prior CABG. Stable
cardiomediastinal silhouette. Normal pulmonary vascularity.
Hyperinflation. No focal consolidation, pleural effusion, or
pneumothorax. No acute osseous abnormality.
IMPRESSION: COPD. No active cardiopulmonary disease. Please note the left
diaphragm and costophrenic angle are not included in the field of
view.

## 2019-06-12 ENCOUNTER — Other Ambulatory Visit: Payer: Self-pay

## 2019-06-12 ENCOUNTER — Other Ambulatory Visit (INDEPENDENT_AMBULATORY_CARE_PROVIDER_SITE_OTHER): Payer: Medicare Other | Admitting: *Deleted

## 2019-06-12 DIAGNOSIS — Z23 Encounter for immunization: Secondary | ICD-10-CM

## 2019-08-12 ENCOUNTER — Other Ambulatory Visit: Payer: Self-pay | Admitting: Cardiovascular Disease

## 2019-09-23 ENCOUNTER — Telehealth: Payer: Self-pay | Admitting: Family Medicine

## 2019-09-23 DIAGNOSIS — Z79899 Other long term (current) drug therapy: Secondary | ICD-10-CM

## 2019-09-23 DIAGNOSIS — Z7901 Long term (current) use of anticoagulants: Secondary | ICD-10-CM

## 2019-09-23 DIAGNOSIS — E785 Hyperlipidemia, unspecified: Secondary | ICD-10-CM

## 2019-09-23 NOTE — Telephone Encounter (Signed)
Orders put in and pt was notified.  

## 2019-09-23 NOTE — Telephone Encounter (Signed)
Lipid, liver, metabolic 7, CBC-hyperlipidemia hypertension chronic anticoagulant

## 2019-09-23 NOTE — Telephone Encounter (Signed)
Last labs 04/09/19 lipid, liver, psa

## 2019-09-23 NOTE — Telephone Encounter (Signed)
Pt has 6 month follow up in Sept. Does pt need to have any lab work before appt.

## 2019-10-09 ENCOUNTER — Other Ambulatory Visit: Payer: Self-pay | Admitting: Family Medicine

## 2019-10-10 DIAGNOSIS — Z23 Encounter for immunization: Secondary | ICD-10-CM | POA: Diagnosis not present

## 2019-10-11 NOTE — Telephone Encounter (Signed)
Check up 04/15/19

## 2019-10-28 DIAGNOSIS — Z79899 Other long term (current) drug therapy: Secondary | ICD-10-CM | POA: Diagnosis not present

## 2019-10-28 DIAGNOSIS — E785 Hyperlipidemia, unspecified: Secondary | ICD-10-CM | POA: Diagnosis not present

## 2019-10-28 DIAGNOSIS — Z7901 Long term (current) use of anticoagulants: Secondary | ICD-10-CM | POA: Diagnosis not present

## 2019-10-29 LAB — CBC WITH DIFFERENTIAL/PLATELET
Basophils Absolute: 0.1 10*3/uL (ref 0.0–0.2)
Basos: 1 %
EOS (ABSOLUTE): 0.2 10*3/uL (ref 0.0–0.4)
Eos: 2 %
Hematocrit: 47.9 % (ref 37.5–51.0)
Hemoglobin: 15.8 g/dL (ref 13.0–17.7)
Immature Grans (Abs): 0 10*3/uL (ref 0.0–0.1)
Immature Granulocytes: 1 %
Lymphocytes Absolute: 2 10*3/uL (ref 0.7–3.1)
Lymphs: 25 %
MCH: 32.2 pg (ref 26.6–33.0)
MCHC: 33 g/dL (ref 31.5–35.7)
MCV: 98 fL — ABNORMAL HIGH (ref 79–97)
Monocytes Absolute: 1.1 10*3/uL — ABNORMAL HIGH (ref 0.1–0.9)
Monocytes: 13 %
Neutrophils Absolute: 4.9 10*3/uL (ref 1.4–7.0)
Neutrophils: 58 %
Platelets: 218 10*3/uL (ref 150–450)
RBC: 4.91 x10E6/uL (ref 4.14–5.80)
RDW: 11.9 % (ref 11.6–15.4)
WBC: 8.2 10*3/uL (ref 3.4–10.8)

## 2019-10-29 LAB — LIPID PANEL
Chol/HDL Ratio: 3.4 ratio (ref 0.0–5.0)
Cholesterol, Total: 197 mg/dL (ref 100–199)
HDL: 58 mg/dL (ref >39)
LDL Chol Calc (NIH): 108 mg/dL — ABNORMAL HIGH (ref 0–99)
Triglycerides: 182 mg/dL — ABNORMAL HIGH (ref 0–149)
VLDL Cholesterol Cal: 31 mg/dL (ref 5–40)

## 2019-10-29 LAB — BASIC METABOLIC PANEL
BUN/Creatinine Ratio: 10 (ref 10–24)
BUN: 7 mg/dL — ABNORMAL LOW (ref 8–27)
CO2: 27 mmol/L (ref 20–29)
Calcium: 9.6 mg/dL (ref 8.6–10.2)
Chloride: 99 mmol/L (ref 96–106)
Creatinine, Ser: 0.68 mg/dL — ABNORMAL LOW (ref 0.76–1.27)
GFR calc Af Amer: 114 mL/min/{1.73_m2} (ref >59)
GFR calc non Af Amer: 99 mL/min/{1.73_m2} (ref >59)
Glucose: 107 mg/dL — ABNORMAL HIGH (ref 65–99)
Potassium: 4.7 mmol/L (ref 3.5–5.2)
Sodium: 140 mmol/L (ref 134–144)

## 2019-10-29 LAB — HEPATIC FUNCTION PANEL
ALT: 34 IU/L (ref 0–44)
AST: 40 IU/L (ref 0–40)
Albumin: 4.3 g/dL (ref 3.8–4.8)
Alkaline Phosphatase: 80 IU/L (ref 44–121)
Bilirubin Total: 0.5 mg/dL (ref 0.0–1.2)
Bilirubin, Direct: 0.21 mg/dL (ref 0.00–0.40)
Total Protein: 6.8 g/dL (ref 6.0–8.5)

## 2019-11-02 ENCOUNTER — Other Ambulatory Visit: Payer: Self-pay | Admitting: Family Medicine

## 2019-11-04 ENCOUNTER — Other Ambulatory Visit: Payer: Self-pay

## 2019-11-04 ENCOUNTER — Encounter: Payer: Self-pay | Admitting: Family Medicine

## 2019-11-04 ENCOUNTER — Ambulatory Visit (INDEPENDENT_AMBULATORY_CARE_PROVIDER_SITE_OTHER): Payer: Medicare Other | Admitting: Family Medicine

## 2019-11-04 VITALS — BP 124/76 | HR 98 | Temp 97.7°F | Wt 189.0 lb

## 2019-11-04 DIAGNOSIS — I480 Paroxysmal atrial fibrillation: Secondary | ICD-10-CM

## 2019-11-04 DIAGNOSIS — E785 Hyperlipidemia, unspecified: Secondary | ICD-10-CM

## 2019-11-04 DIAGNOSIS — J449 Chronic obstructive pulmonary disease, unspecified: Secondary | ICD-10-CM | POA: Diagnosis not present

## 2019-11-04 MED ORDER — FLUTICASONE PROPIONATE 50 MCG/ACT NA SUSP: 2.0000 | Freq: Every day | NASAL | 5 refills | Status: DC

## 2019-11-04 MED ORDER — ALBUTEROL SULFATE HFA 108 (90 BASE) MCG/ACT IN AERS: 2.0000 | INHALATION_SPRAY | RESPIRATORY_TRACT | 5 refills | Status: DC | PRN

## 2019-11-04 NOTE — Patient Instructions (Signed)
Results for orders placed or performed in visit on 09/23/19  Lipid panel  Result Value Ref Range   Cholesterol, Total 197 100 - 199 mg/dL   Triglycerides 588 (H) 0 - 149 mg/dL   HDL 58 >32 mg/dL   VLDL Cholesterol Cal 31 5 - 40 mg/dL   LDL Chol Calc (NIH) 549 (H) 0 - 99 mg/dL   Chol/HDL Ratio 3.4 0.0 - 5.0 ratio  Hepatic function panel  Result Value Ref Range   Total Protein 6.8 6.0 - 8.5 g/dL   Albumin 4.3 3.8 - 4.8 g/dL   Bilirubin Total 0.5 0.0 - 1.2 mg/dL   Bilirubin, Direct 8.26 0.00 - 0.40 mg/dL   Alkaline Phosphatase 80 44 - 121 IU/L   AST 40 0 - 40 IU/L   ALT 34 0 - 44 IU/L  Basic metabolic panel  Result Value Ref Range   Glucose 107 (H) 65 - 99 mg/dL   BUN 7 (L) 8 - 27 mg/dL   Creatinine, Ser 4.15 (L) 0.76 - 1.27 mg/dL   GFR calc non Af Amer 99 >59 mL/min/1.73   GFR calc Af Amer 114 >59 mL/min/1.73   BUN/Creatinine Ratio 10 10 - 24   Sodium 140 134 - 144 mmol/L   Potassium 4.7 3.5 - 5.2 mmol/L   Chloride 99 96 - 106 mmol/L   CO2 27 20 - 29 mmol/L   Calcium 9.6 8.6 - 10.2 mg/dL  CBC with Differential/Platelet  Result Value Ref Range   WBC 8.2 3.4 - 10.8 x10E3/uL   RBC 4.91 4.14 - 5.80 x10E6/uL   Hemoglobin 15.8 13.0 - 17.7 g/dL   Hematocrit 83.0 94.0 - 51.0 %   MCV 98 (H) 79 - 97 fL   MCH 32.2 26.6 - 33.0 pg   MCHC 33.0 31 - 35 g/dL   RDW 76.8 08.8 - 11.0 %   Platelets 218 150 - 450 x10E3/uL   Neutrophils 58 Not Estab. %   Lymphs 25 Not Estab. %   Monocytes 13 Not Estab. %   Eos 2 Not Estab. %   Basos 1 Not Estab. %   Neutrophils Absolute 4.9 1 - 7 x10E3/uL   Lymphocytes Absolute 2.0 0 - 3 x10E3/uL   Monocytes Absolute 1.1 (H) 0 - 0 x10E3/uL   EOS (ABSOLUTE) 0.2 0.0 - 0.4 x10E3/uL   Basophils Absolute 0.1 0 - 0 x10E3/uL   Immature Granulocytes 1 Not Estab. %   Immature Grans (Abs) 0.0 0.0 - 0.1 x10E3/uL

## 2019-11-04 NOTE — Progress Notes (Signed)
Subjective:    Patient ID: [name], male    DOB: 09/30/1950, 68 y.o.   MRN: [id number]  Hyperlipidemia This is a chronic problem. Pertinent negatives include no chest pain or shortness of breath. Treatments tried: crestor. There are no compliance problems.    Fall Risk  11/04/2019 04/15/2019 01/02/2019 12/04/2017 06/01/2015  Falls in the past year? 0 0 0 No No  Comment - - Emmi Telephone Survey: data to providers prior to load - -  Follow up Falls evaluation completed Falls evaluation completed - - -      Results for orders placed or performed in visit on 09/23/19  Lipid panel  Result Value Ref Range   Cholesterol, Total 197 100 - 199 mg/dL   Triglycerides 914 (H) 0 - 149 mg/dL   HDL 58 >78 mg/dL   VLDL Cholesterol Cal 31 5 - 40 mg/dL   LDL Chol Calc (NIH) 295 (H) 0 - 99 mg/dL   Chol/HDL Ratio 3.4 0.0 - 5.0 ratio  Hepatic function panel  Result Value Ref Range   Total Protein 6.8 6.0 - 8.5 g/dL   Albumin 4.3 3.8 - 4.8 g/dL   Bilirubin Total 0.5 0.0 - 1.2 mg/dL   Bilirubin, Direct 6.21 0.00 - 0.40 mg/dL   Alkaline Phosphatase 80 44 - 121 IU/L   AST 40 0 - 40 IU/L   ALT 34 0 - 44 IU/L  Basic metabolic panel  Result Value Ref Range   Glucose 107 (H) 65 - 99 mg/dL   BUN 7 (L) 8 - 27 mg/dL   Creatinine, Ser 3.08 (L) 0.76 - 1.27 mg/dL   GFR calc non Af Amer 99 >59 mL/min/1.73   GFR calc Af Amer 114 >59 mL/min/1.73   BUN/Creatinine Ratio 10 10 - 24   Sodium 140 134 - 144 mmol/L   Potassium 4.7 3.5 - 5.2 mmol/L   Chloride 99 96 - 106 mmol/L   CO2 27 20 - 29 mmol/L   Calcium 9.6 8.6 - 10.2 mg/dL  CBC with Differential/Platelet  Result Value Ref Range   WBC 8.2 3.4 - 10.8 x10E3/uL   RBC 4.91 4.14 - 5.80 x10E6/uL   Hemoglobin 15.8 13.0 - 17.7 g/dL   Hematocrit 65.7 84.6 - 51.0 %   MCV 98 (H) 79 - 97 fL   MCH 32.2 26.6 - 33.0 pg   MCHC 33.0 31 - 35 g/dL   RDW 96.2 95.2 - 84.1 %   Platelets 218 150 - 450 x10E3/uL   Neutrophils 58 Not Estab. %   Lymphs 25 Not  Estab. %   Monocytes 13 Not Estab. %   Eos 2 Not Estab. %   Basos 1 Not Estab. %   Neutrophils Absolute 4.9 1 - 7 x10E3/uL   Lymphocytes Absolute 2.0 0 - 3 x10E3/uL   Monocytes Absolute 1.1 (H) 0 - 0 x10E3/uL   EOS (ABSOLUTE) 0.2 0.0 - 0.4 x10E3/uL   Basophils Absolute 0.1 0 - 0 x10E3/uL   Immature Granulocytes 1 Not Estab. %   Immature Grans (Abs) 0.0 0.0 - 0.1 x10E3/uL   Patient COPD is stable.  Using his daily medicine every day.  Rescue inhaler only occasionally.  Lower humidity of recent weather is much easier on his lungs Denies any other major setbacks  Atrial fibrillar decent control takes his Eliquis on a regular basis no bleeding issues.  Denies any rapid heart rate  Blood pressure decent control watching diet minimizing salt taking his medicines.   Review  of Systems  Constitutional: Negative for activity change, fatigue and fever.  HENT: Negative for congestion and rhinorrhea.   Respiratory: Negative for cough and shortness of breath.   Cardiovascular: Negative for chest pain and leg swelling.  Gastrointestinal: Negative for abdominal pain, diarrhea and nausea.  Genitourinary: Negative for dysuria and hematuria.  Neurological: Negative for weakness and headaches.  Psychiatric/Behavioral: Negative for agitation and behavioral problems.       Objective:   Physical Exam Vitals reviewed.  Constitutional:      General: He is not in acute distress. HENT:     Head: Normocephalic and atraumatic.  Eyes:     General:        Right eye: No discharge.        Left eye: No discharge.  Neck:     Trachea: No tracheal deviation.  Cardiovascular:     Rate and Rhythm: Normal rate.     Heart sounds: Normal heart sounds. No murmur heard.   Pulmonary:     Effort: Pulmonary effort is normal. No respiratory distress.     Breath sounds: Normal breath sounds.  Lymphadenopathy:     Cervical: No cervical adenopathy.  Skin:    General: Skin is warm and dry.  Neurological:      Mental Status: He is alert.     Coordination: Coordination normal.  Psychiatric:        Behavior: Behavior normal.           Assessment & Plan:  1. Chronic obstructive pulmonary disease, unspecified COPD type (HCC) Takes his inhaled steroid combination every day does a good job keeping his lungs under decent control uses rescue inhaler infrequently  2. Paroxysmal atrial fibrillation (HCC) Heart rate controlled continuing current medication blood pressure under good control no sign of heart failure no bleeding issues  3. Hyperlipidemia, unspecified hyperlipidemia type Cholesterol could be better he is trying to watch his diet try to stay physically active taking his medicines as directed  Sees vascular surgery yearly  Sees cardiology yearly  Prediabetes trying to minimize starches in the diet  Labs reviewed with patient Follow-up 6 months Follow-up sooner problems

## 2019-11-18 ENCOUNTER — Ambulatory Visit (INDEPENDENT_AMBULATORY_CARE_PROVIDER_SITE_OTHER): Payer: Medicare Other | Admitting: Cardiology

## 2019-11-18 ENCOUNTER — Encounter: Payer: Self-pay | Admitting: Cardiology

## 2019-11-18 VITALS — BP 118/70 | HR 71 | Ht 69.0 in | Wt 190.6 lb

## 2019-11-18 DIAGNOSIS — I25119 Atherosclerotic heart disease of native coronary artery with unspecified angina pectoris: Secondary | ICD-10-CM

## 2019-11-18 DIAGNOSIS — Z953 Presence of xenogenic heart valve: Secondary | ICD-10-CM

## 2019-11-18 DIAGNOSIS — Z8679 Personal history of other diseases of the circulatory system: Secondary | ICD-10-CM

## 2019-11-18 DIAGNOSIS — E782 Mixed hyperlipidemia: Secondary | ICD-10-CM

## 2019-11-18 NOTE — Patient Instructions (Addendum)
Medication Instructions:   Your physician has recommended you make the following change in your medication:   Stop aspirin  Continue other medications the same  Labwork:  None  Testing/Procedures:  None  Follow-Up:  Your physician recommends that you schedule a follow-up appointment in: 1 year. You will receive a reminder letter in the mail in about 10 months reminding you to call and schedule your appointment. If you don't receive this letter, please contact our office.  Any Other Special Instructions Will Be Listed Below (If Applicable).  If you need a refill on your cardiac medications before your next appointment, please call your pharmacy.

## 2019-11-18 NOTE — Progress Notes (Signed)
Cardiology Office Note  Date: 11/18/2019   ID: FABRICIO ENDSLEY, DOB Jul 01, 1950, MRN [id number]  PCP:  [name], MD  Cardiologist:  Nona Dell, MD Electrophysiologist:  None   Chief Complaint  Patient presents with  . Cardiac follow-up    History of Present Illness: [name] is a 69 y.o. male former patient Dr. Purvis Sheffield now presenting to establish follow-up with me.  I reviewed his records and updated the chart.  He was last seen in October 2020.  He presents for a routine visit.  Reports no active angina or increasing dyspnea on exertion.  No sense of palpitations and no syncope.  I reviewed his medications which are outlined below.  We discussed stopping aspirin at this point since he continues on Eliquis with history of postoperative and paroxysmal atrial fibrillation.  CHA2DS2-VASc score is 3.  His last echocardiogram was in 2018 as noted below.  I personally reviewed his ECG today which shows normal sinus rhythm with anteroseptal Q waves.  His most recent LDL was 108 on Crestor 40 mg daily.  He states that he has been compliant.  He spoke with Dr. Gerda Diss and plans further dietary modifications with recheck in 6 months.  I did talk with him about considering a referral to the lipid clinic.   Past Medical History:  Diagnosis Date  . AAA (abdominal aortic aneurysm) [facility])    Status post EVAR November 2019  . Arthritis   . COPD (chronic obstructive pulmonary disease) (HCC)   . Coronary artery disease    Status post LIMA to LAD and SVG to circumflex July 2018  . Essential hypertension   . Fatty liver   . History of aortic stenosis    Status post 23 mm Magna Ease pericardial AVR July 2018  . Hyperlipidemia   . Myocardial infarction (HCC)   . Postoperative atrial fibrillation (HCC) 2018  . White coat hypertension     Past Surgical History:  Procedure Laterality Date  . ABDOMINAL AORTIC ENDOVASCULAR STENT GRAFT N/A 12/11/2017   Procedure:  ABDOMINAL AORTIC ENDOVASCULAR STENT GRAFT;  Surgeon: Sherren Kerns, MD;  Location: [facility] OR;  Service: Vascular;  Laterality: N/A;  . ANKLE SURGERY Right 2009  . AORTIC VALVE REPLACEMENT N/A 09/05/2016   Procedure: AORTIC VALVE REPLACEMENT (AVR) USING 23 MM MAGNA EASE PERICARDIAL TISSUE VALVE;  Surgeon: Delight Ovens, MD;  Location: [facility] OR;  Service: Open Heart Surgery;  Laterality: N/A;  . APPENDECTOMY    . CARDIAC CATHETERIZATION N/A 02/02/2016   Procedure: Left Heart Cath and Coronary Angiography;  Surgeon: Rinaldo Cloud, MD;  Location: [facility] INVASIVE CV LAB;  Service: Cardiovascular;  Laterality: N/A;  . COLONOSCOPY  06/22/2010   [id number] rectal and right colon polyps removed remainder rectum and colon appared normal  . COLONOSCOPY WITH PROPOFOL N/A 05/15/2014   Procedure: COLONOSCOPY WITH PROPOFOL;  Surgeon: Corbin Ade, MD;  Location: [facility] ORS;  Service: Endoscopy;  Laterality: N/A;  In cecum @ [id number], out @ 0937, withdrawal time 19 minutes  . CORONARY ARTERY BYPASS GRAFT N/A 09/05/2016   Procedure: CORONARY ARTERY BYPASS GRAFTING (CABG) USING LIMA TO DISTAL LAD AND ENDOSCOPICALLY HARVESTED GREATER SAPHENOUS VEIN TO CIRC.;  Surgeon: Delight Ovens, MD;  Location: [facility] OR;  Service: Open Heart Surgery;  Laterality: N/A;  . ENDOVEIN HARVEST OF GREATER SAPHENOUS VEIN Right 09/05/2016   Procedure: ENDOVEIN HARVEST OF GREATER SAPHENOUS VEIN;  Surgeon: Delight Ovens, MD;  Location: [facility] OR;  Service: Open Heart  Surgery;  Laterality: Right;  . POLYPECTOMY N/A 05/15/2014   Procedure: POLYPECTOMY;  Surgeon: Corbin Ade, MD;  Location: [facility] ORS;  Service: Endoscopy;  Laterality: N/A;  . RIGHT/LEFT HEART CATH AND CORONARY ANGIOGRAPHY N/A 08/29/2016   Procedure: Right/Left Heart Cath and Coronary Angiography;  Surgeon: Tonny Bollman, MD;  Location: [facility] INVASIVE CV LAB;  Service: Cardiovascular;  Laterality: N/A;  . TEE WITHOUT CARDIOVERSION N/A 09/05/2016   Procedure: TRANSESOPHAGEAL ECHOCARDIOGRAM  (TEE);  Surgeon: Delight Ovens, MD;  Location: [facility] OR;  Service: Open Heart Surgery;  Laterality: N/A;    Current Outpatient Medications  Medication Sig Dispense Refill  . acetaminophen (TYLENOL) 500 MG tablet Take 500 mg by mouth every 6 (six) hours as needed for moderate pain.     [name] albuterol (VENTOLIN HFA) 108 (90 Base) MCG/ACT inhaler Inhale 2 puffs into the lungs every 4 (four) hours as needed. 18 g 5  . diltiazem (CARDIZEM) 60 MG tablet TAKE 1 TABLET TWICE A DAY 180 tablet 1  . ELIQUIS 5 MG TABS tablet TAKE 1 TABLET BY MOUTH TWICE A DAY 60 tablet 3  . fexofenadine (ALLEGRA) 180 MG tablet Take 180 mg by mouth daily.    . fluticasone (FLONASE) 50 MCG/ACT nasal spray Place 2 sprays into both nostrils daily. 16 g 5  . lisinopril (ZESTRIL) 20 MG tablet TAKE 1 TABLET DAILY (DOSE  INCREASED 02/01/2017). 90 tablet 3  . Multiple Vitamin (MULTIVITAMIN) tablet Take 1 tablet by mouth daily.    . rosuvastatin (CRESTOR) 40 MG tablet TAKE 1 TABLET DAILY (STOP  ATORVASTATIN) 90 tablet 1  . [name] ELLIPTA 100-62.5-25 MCG/INH AEPB Inhale 1 puff into the lungs daily. 28 each 12  . vitamin C (ASCORBIC ACID) 500 MG tablet Take 500 mg by mouth daily.      No current facility-administered medications for this visit.   Allergies:  Ciprofloxacin   ROS: No orthopnea or PND.  Physical Exam: VS:  BP 118/70   Pulse 71   Ht 5\' 9"  (1.753 m)   Wt 190 lb 9.6 oz (86.5 kg)   SpO2 92%   BMI 28.15 kg/m , BMI Body mass index is 28.15 kg/m.  Wt Readings from Last 3 Encounters:  11/18/19 190 lb 9.6 oz (86.5 kg)  11/04/19 189 lb (85.7 kg)  04/15/19 196 lb 12.8 oz (89.3 kg)    General: Patient appears comfortable at rest. HEENT: Conjunctiva and lids normal, wearing a mask. Neck: Supple, no elevated JVP or carotid bruits, no thyromegaly. Lungs: Clear to auscultation, nonlabored breathing at rest. Cardiac: Regular rate and rhythm, no S3, 2/6 systolic murmur. Extremities: No pitting edema, distal pulses  2+.  ECG:  An ECG dated 11/12/2018 was personally reviewed today and demonstrated:  Sinus rhythm with low voltage in the limb leads, possible old anteroseptal infarct pattern.  Recent Labwork: 10/28/2019: ALT 34; AST 40; BUN 7; Creatinine, Ser 0.68; Hemoglobin 15.8; Platelets 218; Potassium 4.7; Sodium 140     Component Value Date/Time   CHOL 197 10/28/2019 0904   TRIG 182 (H) 10/28/2019 0904   HDL 58 10/28/2019 0904   CHOLHDL 3.4 10/28/2019 0904   CHOLHDL 3.1 02/19/2018 0838   VLDL 45 (H) 05/16/2016 0856   LDLCALC 108 (H) 10/28/2019 0904   LDLCALC 78 02/19/2018 0838    Other Studies Reviewed Today:  Echocardiogram 09/14/2016: - Left ventricle: The cavity size was moderately dilated. Systolic  function was vigorous. The estimated ejection fraction was in the  range of 65% to 70%. Wall  motion was normal; there were no  regional wall motion abnormalities. Left ventricular diastolic  function parameters were normal.  - Aortic valve: A 93mm Magna Ease Pericardial bioprosthesis is  present and functioning normally. There was no regurgitation.  There was no significant perivalvular regurgitation. Peak  velocity (S): 287 cm/s. Mean gradient (S): 20 mm Hg.  - Left atrium: The atrium was mildly dilated. Anterior-posterior  dimension: 42 mm.  - Right ventricle: The cavity size was mildly dilated. Wall  thickness was normal.  - Right atrium: The atrium was mildly dilated.  - Pulmonary arteries: Systolic pressure could not be accurately  estimated.   Impressions:   - Compared to prior echo, LVF remains preserved. There is now a  bioprosthetic AVR in place and appears to be functioning grossly  normal. This valve is not well visualized. The mean AV gradient  is . There is no perivalvular AI.   Assessment and Plan:  1.  Status post CABG in 2018 as noted above.  He reports no active angina symptoms.  ECG reviewed.  We are stopping aspirin at this point  given concurrent use of Eliquis.  Otherwise continue lisinopril and Crestor.  2.  Mixed hyperlipidemia, currently on Crestor 40 mg daily.  Recent LDL 108.  He is going to work on diet with recheck in 6 months per Dr. Gerda Diss.  If LDL not 70 or less recommend consultation in the lipid clinic to discuss other options such as PCSK9 inhibitor.  3.  History of aortic stenosis now status post bioprosthetic AVR in 2018.  Echocardiogram from 2018 noted above.  No clear indication for repeat imaging at this time.  4.  Postoperative and paroxysmal atrial fibrillation.  CHA2DS2-VASc score is 3.  Continue Eliquis for stroke prophylaxis.  I reviewed his recent lab work.  Medication Adjustments/Labs and Tests Ordered: Current medicines are reviewed at length with the patient today.  Concerns regarding medicines are outlined above.   Tests Ordered: Orders Placed This Encounter  Procedures  . EKG 12-Lead    Medication Changes: No orders of the defined types were placed in this encounter.   Disposition:  Follow up 1 year in the [place] office.  Signed, Jonelle Sidle, MD, [facility] 11/18/2019 4:29 PM    [facility] [address] [place], [place], [place] [address] Phone: [phone]; Fax: [phone]

## 2019-11-29 ENCOUNTER — Other Ambulatory Visit: Payer: Self-pay | Admitting: Family Medicine

## 2019-11-29 ENCOUNTER — Telehealth: Payer: Medicare Other | Admitting: Emergency Medicine

## 2019-11-29 DIAGNOSIS — J019 Acute sinusitis, unspecified: Secondary | ICD-10-CM

## 2019-11-29 MED ORDER — AZITHROMYCIN 250 MG PO TABS: ORAL_TABLET | ORAL | 0 refills | Status: DC

## 2019-11-29 MED ORDER — METHYLPREDNISOLONE 4 MG PO TBPK: ORAL_TABLET | ORAL | 0 refills | Status: DC

## 2019-11-29 NOTE — Progress Notes (Signed)
**  Please do not respond to this message unless you have follow up questions.**  We are sorry that you are not feeling well.  Here is how we plan to help!  Based on what you have shared with me it looks like you have sinusitis.  Sinusitis is inflammation and infection in the sinus cavities of the head.  Based on your presentation I believe you most likely have Acute Bacterial Sinusitis.  This is an infection caused by bacteria and is treated with antibiotics. I have prescribed a Z pack and Medrol Dose Pack, use both as directed. You may use an oral decongestant such as Mucinex D or if you have glaucoma or high blood pressure use plain Mucinex. Saline nasal spray help and can safely be used as often as needed for congestion.  If you develop worsening sinus pain, fever or notice severe headache and vision changes, or if symptoms are not better after completion of antibiotic, please schedule an appointment with a health care provider.    Sinus infections are not as easily transmitted as other respiratory infection, however we still recommend that you avoid close contact with loved ones, especially the very young and elderly.  Remember to wash your hands thoroughly throughout the day as this is the number one way to prevent the spread of infection!  Home Care:  Only take medications as instructed by your medical team.  Complete the entire course of an antibiotic.  Do not take these medications with alcohol.  A steam or ultrasonic humidifier can help congestion.  You can place a towel over your head and breathe in the steam from hot water coming from a faucet.  Avoid close contacts especially the very young and the elderly.  Cover your mouth when you cough or sneeze.  Always remember to wash your hands.  Get Help Right Away If:  You develop worsening fever or sinus pain.  You develop a severe head ache or visual changes.  Your symptoms persist after you have completed your treatment  plan.  Make sure you  Understand these instructions.  Will watch your condition.  Will get help right away if you are not doing well or get worse.  Your e-visit answers were reviewed by a board certified advanced clinical practitioner to complete your personal care plan.  Depending on the condition, your plan could have included both over the counter or prescription medications.  If there is a problem please reply  once you have received a response from your provider.  Your safety is important to [place].  If you have drug allergies check your prescription carefully.    You can use MyChart to ask questions about today's visit, request a non-urgent call back, or ask for a work or school excuse for 24 hours related to this e-Visit. If it has been greater than 24 hours you will need to follow up with your provider, or enter a new e-Visit to address those concerns.  You will get an e-mail in the next two days asking about your experience.  I hope that your e-visit has been valuable and will speed your recovery. Thank you for using e-visits.    Greater than 5 but less than 10 minutes spent researching, coordinating, and implementing care for this patient today

## 2019-12-12 DIAGNOSIS — Z23 Encounter for immunization: Secondary | ICD-10-CM | POA: Diagnosis not present

## 2020-01-13 ENCOUNTER — Other Ambulatory Visit: Payer: Self-pay | Admitting: *Deleted

## 2020-01-13 DIAGNOSIS — I714 Abdominal aortic aneurysm, without rupture, unspecified: Secondary | ICD-10-CM

## 2020-01-22 ENCOUNTER — Ambulatory Visit (INDEPENDENT_AMBULATORY_CARE_PROVIDER_SITE_OTHER): Payer: Medicare Other | Admitting: Physician Assistant

## 2020-01-22 ENCOUNTER — Other Ambulatory Visit: Payer: Self-pay

## 2020-01-22 ENCOUNTER — Ambulatory Visit (HOSPITAL_COMMUNITY)
Admission: RE | Admit: 2020-01-22 | Discharge: 2020-01-22 | Disposition: A | Discharge status: Home / Self Care | Payer: Medicare Other | Source: Ambulatory Visit | Attending: Physician Assistant | Admitting: Physician Assistant

## 2020-01-22 VITALS — BP 138/81 | HR 103 | Temp 97.8°F | Resp 20 | Ht 69.0 in | Wt 180.8 lb

## 2020-01-22 DIAGNOSIS — I714 Abdominal aortic aneurysm, without rupture, unspecified: Secondary | ICD-10-CM

## 2020-01-22 DIAGNOSIS — I25119 Atherosclerotic heart disease of native coronary artery with unspecified angina pectoris: Secondary | ICD-10-CM | POA: Diagnosis not present

## 2020-01-22 NOTE — Progress Notes (Signed)
History of Present Illness:  Patient is a 69 y.o. year old male who presents for evaluation of abdominal aortic aneurysm.  S/P EVAR 12/12/19 by Dr. Darrick Penna.  He denise abdominal pain, new lumbar pain, rest pain or symptoms of claudication.  His mobility is limited by COPD.  He is on Eliquis for valve replacement and Crestor for a daily Statin.    Past Medical History:  Diagnosis Date  . AAA (abdominal aortic aneurysm) [facility])    Status post EVAR November 2019  . Arthritis   . COPD (chronic obstructive pulmonary disease) (HCC)   . Coronary artery disease    Status post LIMA to LAD and SVG to circumflex July 2018  . Essential hypertension   . Fatty liver   . History of aortic stenosis    Status post 23 mm Magna Ease pericardial AVR July 2018  . Hyperlipidemia   . Myocardial infarction (HCC)   . Postoperative atrial fibrillation (HCC) 2018  . White coat hypertension     Past Surgical History:  Procedure Laterality Date  . ABDOMINAL AORTIC ENDOVASCULAR STENT GRAFT N/A 12/11/2017   Procedure: ABDOMINAL AORTIC ENDOVASCULAR STENT GRAFT;  Surgeon: Sherren Kerns, MD;  Location: [facility] OR;  Service: Vascular;  Laterality: N/A;  . ANKLE SURGERY Right 2009  . AORTIC VALVE REPLACEMENT N/A 09/05/2016   Procedure: AORTIC VALVE REPLACEMENT (AVR) USING 23 MM MAGNA EASE PERICARDIAL TISSUE VALVE;  Surgeon: Delight Ovens, MD;  Location: [facility] OR;  Service: Open Heart Surgery;  Laterality: N/A;  . APPENDECTOMY    . CARDIAC CATHETERIZATION N/A 02/02/2016   Procedure: Left Heart Cath and Coronary Angiography;  Surgeon: Rinaldo Cloud, MD;  Location: [facility] INVASIVE CV LAB;  Service: Cardiovascular;  Laterality: N/A;  . COLONOSCOPY  06/22/2010   IRC:VELFYBOFBP rectal and right colon polyps removed remainder rectum and colon appared normal  . COLONOSCOPY WITH PROPOFOL N/A 05/15/2014   Procedure: COLONOSCOPY WITH PROPOFOL;  Surgeon: Corbin Ade, MD;  Location: [facility] ORS;  Service: Endoscopy;  Laterality: N/A;  In  cecum @ [id number], out @ 0937, withdrawal time 19 minutes  . CORONARY ARTERY BYPASS GRAFT N/A 09/05/2016   Procedure: CORONARY ARTERY BYPASS GRAFTING (CABG) USING LIMA TO DISTAL LAD AND ENDOSCOPICALLY HARVESTED GREATER SAPHENOUS VEIN TO CIRC.;  Surgeon: Delight Ovens, MD;  Location: [facility] OR;  Service: Open Heart Surgery;  Laterality: N/A;  . ENDOVEIN HARVEST OF GREATER SAPHENOUS VEIN Right 09/05/2016   Procedure: ENDOVEIN HARVEST OF GREATER SAPHENOUS VEIN;  Surgeon: Delight Ovens, MD;  Location: [facility] OR;  Service: Open Heart Surgery;  Laterality: Right;  . POLYPECTOMY N/A 05/15/2014   Procedure: POLYPECTOMY;  Surgeon: Corbin Ade, MD;  Location: [facility] ORS;  Service: Endoscopy;  Laterality: N/A;  . RIGHT/LEFT HEART CATH AND CORONARY ANGIOGRAPHY N/A 08/29/2016   Procedure: Right/Left Heart Cath and Coronary Angiography;  Surgeon: Tonny Bollman, MD;  Location: [facility] INVASIVE CV LAB;  Service: Cardiovascular;  Laterality: N/A;  . TEE WITHOUT CARDIOVERSION N/A 09/05/2016   Procedure: TRANSESOPHAGEAL ECHOCARDIOGRAM (TEE);  Surgeon: Delight Ovens, MD;  Location: [facility] OR;  Service: Open Heart Surgery;  Laterality: N/A;     Social History Social History   Tobacco Use  . Smoking status: Former Smoker    Packs/day: 1.50    Years: 40.00    Pack years: 60.00    Types: Cigarettes    Quit date: 04/16/2002    Years since quitting: 17.7  . Smokeless tobacco: Never Used  Vaping Use  .  Vaping Use: Never used  Substance Use Topics  . Alcohol use: Yes    Alcohol/week: 5.0 standard drinks    Types: 5 Cans of beer per week    Comment: per pt  . Drug use: No    Family History Family History  Problem Relation Age of Onset  . COPD Mother   . Hypertension Sister   . AAA (abdominal aortic aneurysm) Brother   . Colon cancer Neg Hx   . Liver disease Neg Hx     Allergies  Allergies  Allergen Reactions  . Ciprofloxacin     Artificial heart valve     Current Outpatient Medications  Medication Sig  Dispense Refill  . acetaminophen (TYLENOL) 500 MG tablet Take 500 mg by mouth every 6 (six) hours as needed for moderate pain.     [name] diltiazem (CARDIZEM) 60 MG tablet TAKE 1 TABLET TWICE A DAY 180 tablet 1  . ELIQUIS 5 MG TABS tablet TAKE 1 TABLET BY MOUTH TWICE A DAY 60 tablet 3  . fexofenadine (ALLEGRA) 180 MG tablet Take 180 mg by mouth daily.    . fluticasone (FLONASE) 50 MCG/ACT nasal spray Place 2 sprays into both nostrils daily. 16 g 5  . lisinopril (ZESTRIL) 20 MG tablet TAKE 1 TABLET DAILY (DOSE  INCREASED 02/01/2017). 90 tablet 3  . Multiple Vitamin (MULTIVITAMIN) tablet Take 1 tablet by mouth daily.    . rosuvastatin (CRESTOR) 40 MG tablet TAKE 1 TABLET DAILY (STOP  ATORVASTATIN) 90 tablet 1  . [name] ELLIPTA 100-62.5-25 MCG/INH AEPB Inhale 1 puff into the lungs daily. 28 each 12  . vitamin C (ASCORBIC ACID) 500 MG tablet Take 500 mg by mouth daily.     [name] albuterol (VENTOLIN HFA) 108 (90 Base) MCG/ACT inhaler Inhale 2 puffs into the lungs every 4 (four) hours as needed. 18 g 5   No current facility-administered medications for this visit.    ROS:   General:  No weight loss, Fever, chills  HEENT: No recent headaches, no nasal bleeding, no visual changes, no sore throat  Neurologic: No dizziness, blackouts, seizures. No recent symptoms of stroke or mini- stroke. No recent episodes of slurred speech, or temporary blindness.  Cardiac: No recent episodes of chest pain/pressure, no shortness of breath at rest.  No shortness of breath with exertion.  Denies history of atrial fibrillation or irregular heartbeat  Vascular: No history of rest pain in feet.  No history of claudication.  No history of non-healing ulcer, No history of DVT   Pulmonary: No home oxygen, no productive cough, no hemoptysis,  No asthma or wheezing  Musculoskeletal:  [ ]  Arthritis, [ ]  Low back pain,  [ ]  Joint pain  Hematologic:No history of hypercoagulable state.  No history of easy bleeding.  No history of  anemia  Gastrointestinal: No hematochezia or melena,  No gastroesophageal reflux, no trouble swallowing  Urinary: [ ]  chronic Kidney disease, [ ]  on HD - [ ]  MWF or [ ]  TTHS, [ ]  Burning with urination, [ ]  Frequent urination, [ ]  Difficulty urinating;   Skin: No rashes  Psychological: No history of anxiety,  No history of depression   Physical Examination  Vitals:   01/22/20 0943  BP: 138/81  Pulse: (!) 103  Resp: 20  Temp: 97.8 F (36.6 C)  TempSrc: Temporal  SpO2: 95%  Weight: 180 lb 12.8 oz (82 kg)  Height: 5\' 9"  (1.753 m)    Body mass index is 26.7 kg/m.  General:  Alert and oriented, no acute distress HEENT: Normal Neck: No bruit or JVD Pulmonary: Clear to auscultation bilaterally Cardiac: Regular Rate and Rhythm without murmur Gastrointestinal: Soft, non-tender, non-distended, no mass, no scars Skin: No rash Extremity Pulses:  2+ radial, brachial, femoral, not palpable dorsalis pedis, posterior tibial pulses bilaterally Musculoskeletal: No deformity or edema  Neurologic: Upper and lower extremity motor 5/5 and symmetric  DATA:  Endovascular Aortic Repair (EVAR):  +----------+----------------+-------------------+-------------------+       Diameter AP (cm)Diameter Trans (cm)Velocities (cm/sec)  +----------+----------------+-------------------+-------------------+  Aorta   4.48      4.48        43           +----------+----------------+-------------------+-------------------+  Right Limb1.06      1.11        78           +----------+----------------+-------------------+-------------------+  Left Limb 1.11      1.09        28           +----------+----------------+-------------------+-------------------+   Summary:  Abdominal Aorta: The largest aortic diameter remains essentially unchanged  compared to prior exam. Previous diameter measurement was 4.5 cm obtained  on  02/02/2017.      ASSESSMENT:  AAA s/p EVAR by Dr. Darrick Penna 12/11/2017 His Duplex is essentially unchanged.  He remains asymptomatic.     PLAN: He will continue to stay as active as he is able pending his COPD.  If he develops symptoms of claudication, rest pain or non healing wounds he will call.  Otherwise he will f/u in 1 year for surveillance repeat EVAR duplex.     Mosetta Pigeon PA-C [facility] Office: [phone]  MD in clinic [place]

## 2020-03-19 ENCOUNTER — Other Ambulatory Visit: Payer: Self-pay

## 2020-03-19 ENCOUNTER — Encounter: Payer: Self-pay | Admitting: Family Medicine

## 2020-03-19 ENCOUNTER — Telehealth (INDEPENDENT_AMBULATORY_CARE_PROVIDER_SITE_OTHER): Payer: Medicare Other | Admitting: Family Medicine

## 2020-03-19 DIAGNOSIS — Z20822 Contact with and (suspected) exposure to covid-19: Secondary | ICD-10-CM

## 2020-03-19 NOTE — Progress Notes (Signed)
   Subjective:    Patient ID: [name], male    DOB: 07-07-1950, 70 y.o.   MRN: [id number]  HPI  Patient presents today with respiratory illness Number of days present-3 days  Symptoms include- chills, headahce, stomach cramps, diarrhea, fever 100.5 the highest. No appetite, achy.   Presence of worrisome signs (severe shortness of breath, lethargy, etc.) - none He relates some fatigue and tiredness states his breathing is doing okay has underlying COPD his usual O2 sats are 90-92 that is what they are currently Recent/current visit to urgent care or ER- none  Recent direct exposure to Covid- wife tested positive for covid on jan 25th.   Any current Covid testing- none His wife tested positive toward the end of January that she has been well over a week.  Unlikely that he got it from her but cannot rule that out completely.  But that is inconsequential. Virtual Visit via Telephone Note  I connected with [name] on 03/19/20 at 11:00 AM EST by telephone and verified that I am speaking with the correct person using two identifiers.  Location: Patient: home Provider: office   I discussed the limitations, risks, security and privacy concerns of performing an evaluation and management service by telephone and the availability of in person appointments. I also discussed with the patient that there may be a patient responsible charge related to this service. The patient expressed understanding and agreed to proceed.   History of Present Illness:    Observations/Objective:   Assessment and Plan:   Follow Up Instructions:    I discussed the assessment and treatment plan with the patient. The patient was provided an opportunity to ask questions and all were answered. The patient agreed with the plan and demonstrated an understanding of the instructions.   The patient was advised to call back or seek an in-person evaluation if the symptoms worsen or if the  condition fails to improve as anticipated.  I provided 15 minutes of non-face-to-face time during this encounter.   He has underlying COPD which puts him at high risk as well as his age.  On the positive side he has had the vaccine and booster.     Review of Systems He describes intermittent coughing describes low-grade fever denies any change in the shortness of breath energy level fair no nausea or vomiting did have some diarrhea    Objective:   Physical Exam  Today's visit was via telephone Physical exam was not possible for this visit Patient was able to have conversation without shortness of breath his O2 saturation 90-92 which is normal for him      Assessment & Plan:  Probable Covid Needs testing If testing is positive I would recommend antivirals.  Unlikely that we will be able to get monoclonal antibody infusion done this week and by next week and will be past the window Patient currently stable Does have underlying lung disease. Has been vaccinated and boosted.

## 2020-03-20 LAB — NOVEL CORONAVIRUS, NAA: SARS-CoV-2, NAA: NOT DETECTED

## 2020-03-20 LAB — SARS-COV-2, NAA 2 DAY TAT

## 2020-03-26 ENCOUNTER — Telehealth: Payer: Self-pay

## 2020-03-26 NOTE — Telephone Encounter (Signed)
Pt made Phy with Dr Lorin Picket Monday 03/28 and needs blood work ordered  Pt call back [phone]

## 2020-03-26 NOTE — Telephone Encounter (Signed)
Last lab completed on 10/28/19 CBC, BMET, Hepatic and Lipid. Please advise. Thank you

## 2020-03-27 ENCOUNTER — Other Ambulatory Visit: Payer: Self-pay | Admitting: *Deleted

## 2020-03-27 DIAGNOSIS — Z125 Encounter for screening for malignant neoplasm of prostate: Secondary | ICD-10-CM

## 2020-03-27 DIAGNOSIS — E785 Hyperlipidemia, unspecified: Secondary | ICD-10-CM

## 2020-03-27 DIAGNOSIS — I10 Essential (primary) hypertension: Secondary | ICD-10-CM

## 2020-03-27 NOTE — Telephone Encounter (Signed)
Bw orders put in and pt was notified.  

## 2020-03-27 NOTE — Telephone Encounter (Signed)
Lipid, CMP, PSA Screening prostate cancer, hyperlipidemia, hypertension thank you

## 2020-03-30 ENCOUNTER — Other Ambulatory Visit: Payer: Self-pay | Admitting: Cardiology

## 2020-03-30 ENCOUNTER — Other Ambulatory Visit: Payer: Self-pay | Admitting: *Deleted

## 2020-03-30 ENCOUNTER — Encounter: Payer: Self-pay | Admitting: Family Medicine

## 2020-03-30 MED ORDER — LISINOPRIL 20 MG PO TABS: 20.0000 mg | ORAL_TABLET | Freq: Every day | ORAL | 3 refills | Status: DC

## 2020-03-30 NOTE — Telephone Encounter (Signed)
Prescription refill request for Eliquis received. Indication: A Fib Last office visit: 11/18/19 Scr: 0.68 on 10/28/19 Age: 70 Weight: 86.5kg  Based on above findings Eliquis 5mg  bid is the appropriate dosage.  Refill approved.

## 2020-03-31 MED ORDER — ROSUVASTATIN CALCIUM 40 MG PO TABS: ORAL_TABLET | ORAL | 1 refills | Status: DC

## 2020-03-31 MED ORDER — DILTIAZEM HCL 60 MG PO TABS: ORAL_TABLET | ORAL | 1 refills | Status: DC

## 2020-03-31 NOTE — Telephone Encounter (Signed)
Nurses Please send in 90-day with 1 refill as requested for both of these medicines to the requested pharmacy thank you

## 2020-03-31 NOTE — Addendum Note (Signed)
Addended by: Marlowe Shores on: 03/31/2020 04:44 PM   Modules accepted: Orders

## 2020-04-25 LAB — COMPREHENSIVE METABOLIC PANEL
ALT: 16 IU/L (ref 0–44)
AST: 26 IU/L (ref 0–40)
Albumin/Globulin Ratio: 1.9 (ref 1.2–2.2)
Albumin: 4.5 g/dL (ref 3.8–4.8)
Alkaline Phosphatase: 49 IU/L (ref 44–121)
BUN/Creatinine Ratio: 10 (ref 10–24)
BUN: 7 mg/dL — ABNORMAL LOW (ref 8–27)
Bilirubin Total: 0.5 mg/dL (ref 0.0–1.2)
CO2: 26 mmol/L (ref 20–29)
Calcium: 9.9 mg/dL (ref 8.6–10.2)
Chloride: 99 mmol/L (ref 96–106)
Creatinine, Ser: 0.71 mg/dL — ABNORMAL LOW (ref 0.76–1.27)
Globulin, Total: 2.4 g/dL (ref 1.5–4.5)
Glucose: 99 mg/dL (ref 65–99)
Potassium: 4.8 mmol/L (ref 3.5–5.2)
Sodium: 139 mmol/L (ref 134–144)
Total Protein: 6.9 g/dL (ref 6.0–8.5)
eGFR: 100 mL/min/{1.73_m2} (ref >59)

## 2020-04-25 LAB — LIPID PANEL
Chol/HDL Ratio: 3.1 ratio (ref 0.0–5.0)
Cholesterol, Total: 156 mg/dL (ref 100–199)
HDL: 50 mg/dL (ref >39)
LDL Chol Calc (NIH): 80 mg/dL (ref 0–99)
Triglycerides: 149 mg/dL (ref 0–149)
VLDL Cholesterol Cal: 26 mg/dL (ref 5–40)

## 2020-04-25 LAB — PSA: Prostate Specific Ag, Serum: 3.7 ng/mL (ref 0.0–4.0)

## 2020-04-26 ENCOUNTER — Other Ambulatory Visit: Payer: Self-pay | Admitting: Family Medicine

## 2020-05-04 ENCOUNTER — Ambulatory Visit (INDEPENDENT_AMBULATORY_CARE_PROVIDER_SITE_OTHER): Payer: Medicare Other | Admitting: Family Medicine

## 2020-05-04 ENCOUNTER — Other Ambulatory Visit: Payer: Self-pay

## 2020-05-04 VITALS — BP 122/78 | HR 90 | Temp 98.4°F | Ht 67.5 in | Wt 184.6 lb

## 2020-05-04 DIAGNOSIS — R221 Localized swelling, mass and lump, neck: Secondary | ICD-10-CM

## 2020-05-04 DIAGNOSIS — I714 Abdominal aortic aneurysm, without rupture, unspecified: Secondary | ICD-10-CM

## 2020-05-04 DIAGNOSIS — J449 Chronic obstructive pulmonary disease, unspecified: Secondary | ICD-10-CM

## 2020-05-04 DIAGNOSIS — E785 Hyperlipidemia, unspecified: Secondary | ICD-10-CM

## 2020-05-04 DIAGNOSIS — Z125 Encounter for screening for malignant neoplasm of prostate: Secondary | ICD-10-CM | POA: Diagnosis not present

## 2020-05-04 DIAGNOSIS — I480 Paroxysmal atrial fibrillation: Secondary | ICD-10-CM

## 2020-05-04 DIAGNOSIS — I25119 Atherosclerotic heart disease of native coronary artery with unspecified angina pectoris: Secondary | ICD-10-CM

## 2020-05-04 DIAGNOSIS — Z0000 Encounter for general adult medical examination without abnormal findings: Secondary | ICD-10-CM | POA: Diagnosis not present

## 2020-05-04 NOTE — Progress Notes (Signed)
Subjective:    Patient ID: [name], male    DOB: 1950-02-28, 70 y.o.   MRN: [id number]  HPI  AWV- Annual Wellness Visit  The patient was seen for their annual wellness visit. The patient's past medical history, surgical history, and family history were reviewed. Pertinent vaccines were reviewed ( tetanus, pneumonia, shingles, flu) The patient's medication list was reviewed and updated.  The height and weight were entered.  BMI recorded in electronic record elsewhere  Cognitive screening was completed. Outcome of Mini - Cog: pass   Falls /depression screening electronically recorded within record elsewhere  Current tobacco usage: quit 17 years ago (All patients who use tobacco were given written and verbal information on quitting)  Recent listing of emergency department/hospitalizations over the past year were reviewed.  current specialist the patient sees on a regular basis: Dr Domenic Polite- cardiology and Dr Oneida Alar- vascular   Medicare annual wellness visit patient questionnaire was reviewed.  A written screening schedule for the patient for the next 5-10 years was given. Appropriate discussion of followup regarding next visit was discussed. Patient does follow with cardiology regular basis Also follows with vascular surgery regarding his stent for aneurysm Continues to take his cholesterol medicine regular basis denies any problems tries watch diet Does use his inhalers on a regular basis states his breathing overall been doing fairly well trying to exercise a little more by staying active outside Does do a good job taking his blood pressure medicines on a regular basis Results for orders placed or performed in visit on 03/27/20  Lipid panel  Result Value Ref Range   Cholesterol, Total 156 100 - 199 mg/dL   Triglycerides 149 0 - 149 mg/dL   HDL 50 >39 mg/dL   VLDL Cholesterol Cal 26 5 - 40 mg/dL   LDL Chol Calc (NIH) 80 0 - 99 mg/dL   Chol/HDL Ratio 3.1 0.0 -  5.0 ratio  Comprehensive metabolic panel  Result Value Ref Range   Glucose 99 65 - 99 mg/dL   BUN 7 (L) 8 - 27 mg/dL   Creatinine, Ser 0.71 (L) 0.76 - 1.27 mg/dL   eGFR 100 >59 mL/min/1.73   BUN/Creatinine Ratio 10 10 - 24   Sodium 139 134 - 144 mmol/L   Potassium 4.8 3.5 - 5.2 mmol/L   Chloride 99 96 - 106 mmol/L   CO2 26 20 - 29 mmol/L   Calcium 9.9 8.6 - 10.2 mg/dL   Total Protein 6.9 6.0 - 8.5 g/dL   Albumin 4.5 3.8 - 4.8 g/dL   Globulin, Total 2.4 1.5 - 4.5 g/dL   Albumin/Globulin Ratio 1.9 1.2 - 2.2   Bilirubin Total 0.5 0.0 - 1.2 mg/dL   Alkaline Phosphatase 49 44 - 121 IU/L   AST 26 0 - 40 IU/L   ALT 16 0 - 44 IU/L  PSA  Result Value Ref Range   Prostate Specific Ag, Serum 3.7 0.0 - 4.0 ng/mL       Review of Systems  Constitutional: Negative for diaphoresis and fatigue.  HENT: Negative for congestion and rhinorrhea.   Respiratory: Negative for cough and shortness of breath.   Cardiovascular: Negative for chest pain and leg swelling.  Gastrointestinal: Negative for abdominal pain and diarrhea.  Skin: Negative for color change and rash.  Neurological: Negative for dizziness and headaches.  Psychiatric/Behavioral: Negative for behavioral problems and confusion.       Objective:   Physical Exam Vitals reviewed.  Constitutional:  General: He is not in acute distress. HENT:     Head: Normocephalic and atraumatic.  Eyes:     General:        Right eye: No discharge.        Left eye: No discharge.  Neck:     Trachea: No tracheal deviation.  Cardiovascular:     Rate and Rhythm: Normal rate and regular rhythm.     Heart sounds: Normal heart sounds. No murmur heard.   Pulmonary:     Effort: Pulmonary effort is normal. No respiratory distress.     Breath sounds: Normal breath sounds.  Lymphadenopathy:     Cervical: No cervical adenopathy.  Skin:    General: Skin is warm and dry.  Neurological:     Mental Status: He is alert.     Coordination:  Coordination normal.  Psychiatric:        Behavior: Behavior normal.    He does have a soft skin tissue nodule on the right side that appears to be more of a sebaceous cyst  Prostate exam normal Labs reviewed with the patient in detail    Assessment & Plan:  1. Encounter for subsequent annual wellness visit (AWV) in Medicare patient Adult wellness-complete.wellness physical was conducted today. Importance of diet and exercise were discussed in detail.  In addition to this a discussion regarding safety was also covered. We also reviewed over immunizations and gave recommendations regarding current immunization needed for age.  In addition to this additional areas were also touched on including: Preventative health exams needed:  Colonoscopy next 02/27/2024  Patient was advised yearly wellness exam   2. Hyperlipidemia, unspecified hyperlipidemia type Continue medication watch diet stay active - Lipid panel  3. Screening for prostate cancer PSA repeat again because it did go up more than 50% from last time - PSA  4. Neck nodule This appears to be more of a sebaceous cyst in the skin but we need to rule out the possibility of a lymph node - US Soft Tissue Head/Neck (NON-THYROID)  5. Atherosclerosis of native coronary artery of native heart with angina pectoris ([place]) Part overall stable on medications  6. Chronic obstructive pulmonary disease, unspecified COPD type ([place]) Lungs stable with inhalers stay active  7. Abdominal aortic aneurysm (AAA) without rupture ([place]) Follows with Dr. Oneida Alar on a yearly basis no leakage from the stent  8. Paroxysmal atrial fibrillation (HCC) Good control tolerating his medications

## 2020-05-14 ENCOUNTER — Ambulatory Visit (HOSPITAL_COMMUNITY)
Admission: RE | Admit: 2020-05-14 | Discharge: 2020-05-14 | Disposition: A | Discharge status: Home / Self Care | Payer: Medicare Other | Source: Ambulatory Visit | Attending: Family Medicine | Admitting: Family Medicine

## 2020-05-14 ENCOUNTER — Other Ambulatory Visit: Payer: Self-pay

## 2020-05-14 DIAGNOSIS — R221 Localized swelling, mass and lump, neck: Secondary | ICD-10-CM | POA: Insufficient documentation

## 2020-05-18 ENCOUNTER — Other Ambulatory Visit: Payer: Self-pay

## 2020-05-18 DIAGNOSIS — R221 Localized swelling, mass and lump, neck: Secondary | ICD-10-CM

## 2020-06-04 ENCOUNTER — Encounter: Payer: Self-pay | Admitting: General Surgery

## 2020-06-04 ENCOUNTER — Ambulatory Visit: Payer: Medicare Other | Admitting: General Surgery

## 2020-06-04 ENCOUNTER — Other Ambulatory Visit: Payer: Self-pay

## 2020-06-04 VITALS — BP 150/87 | HR 87 | Temp 98.7°F | Resp 14 | Ht 69.0 in | Wt 187.0 lb

## 2020-06-04 DIAGNOSIS — L723 Sebaceous cyst: Secondary | ICD-10-CM

## 2020-06-04 NOTE — Progress Notes (Signed)
[name]; [id number]; [birth year]   HPI Patient is a 70 year old white male who was referred to my care by Lilyan Punt for evaluation and treatment of a cyst of the neck.  Patient states is been present for 1 year.  His wife noticed it.  He denies any drainage or pain.  He did have an ultrasound of it which revealed a sebaceous cyst. Past Medical History:  Diagnosis Date  . AAA (abdominal aortic aneurysm) [facility])    Status post EVAR November 2019  . Arthritis   . COPD (chronic obstructive pulmonary disease) (HCC)   . Coronary artery disease    Status post LIMA to LAD and SVG to circumflex July 2018  . Essential hypertension   . Fatty liver   . History of aortic stenosis    Status post 23 mm Magna Ease pericardial AVR July 2018  . Hyperlipidemia   . Myocardial infarction (HCC)   . Postoperative atrial fibrillation (HCC) 2018  . White coat hypertension     Past Surgical History:  Procedure Laterality Date  . ABDOMINAL AORTIC ENDOVASCULAR STENT GRAFT N/A 12/11/2017   Procedure: ABDOMINAL AORTIC ENDOVASCULAR STENT GRAFT;  Surgeon: Sherren Kerns, MD;  Location: [facility] OR;  Service: Vascular;  Laterality: N/A;  . ANKLE SURGERY Right 2009  . AORTIC VALVE REPLACEMENT N/A 09/05/2016   Procedure: AORTIC VALVE REPLACEMENT (AVR) USING 23 MM MAGNA EASE PERICARDIAL TISSUE VALVE;  Surgeon: Delight Ovens, MD;  Location: [facility] OR;  Service: Open Heart Surgery;  Laterality: N/A;  . APPENDECTOMY    . CARDIAC CATHETERIZATION N/A 02/02/2016   Procedure: Left Heart Cath and Coronary Angiography;  Surgeon: Rinaldo Cloud, MD;  Location: [facility] INVASIVE CV LAB;  Service: Cardiovascular;  Laterality: N/A;  . COLONOSCOPY  06/22/2010   [id number] rectal and right colon polyps removed remainder rectum and colon appared normal  . COLONOSCOPY WITH PROPOFOL N/A 05/15/2014   Procedure: COLONOSCOPY WITH PROPOFOL;  Surgeon: Corbin Ade, MD;  Location: [facility] ORS;  Service: Endoscopy;  Laterality: N/A;  In cecum  @ [id number], out @ 0937, withdrawal time 19 minutes  . CORONARY ARTERY BYPASS GRAFT N/A 09/05/2016   Procedure: CORONARY ARTERY BYPASS GRAFTING (CABG) USING LIMA TO DISTAL LAD AND ENDOSCOPICALLY HARVESTED GREATER SAPHENOUS VEIN TO CIRC.;  Surgeon: Delight Ovens, MD;  Location: [facility] OR;  Service: Open Heart Surgery;  Laterality: N/A;  . ENDOVEIN HARVEST OF GREATER SAPHENOUS VEIN Right 09/05/2016   Procedure: ENDOVEIN HARVEST OF GREATER SAPHENOUS VEIN;  Surgeon: Delight Ovens, MD;  Location: [facility] OR;  Service: Open Heart Surgery;  Laterality: Right;  . POLYPECTOMY N/A 05/15/2014   Procedure: POLYPECTOMY;  Surgeon: Corbin Ade, MD;  Location: [facility] ORS;  Service: Endoscopy;  Laterality: N/A;  . RIGHT/LEFT HEART CATH AND CORONARY ANGIOGRAPHY N/A 08/29/2016   Procedure: Right/Left Heart Cath and Coronary Angiography;  Surgeon: Tonny Bollman, MD;  Location: [facility] INVASIVE CV LAB;  Service: Cardiovascular;  Laterality: N/A;  . TEE WITHOUT CARDIOVERSION N/A 09/05/2016   Procedure: TRANSESOPHAGEAL ECHOCARDIOGRAM (TEE);  Surgeon: Delight Ovens, MD;  Location: [facility] OR;  Service: Open Heart Surgery;  Laterality: N/A;    Family History  Problem Relation Age of Onset  . COPD Mother   . Hypertension Sister   . AAA (abdominal aortic aneurysm) Brother   . Colon cancer Neg Hx   . Liver disease Neg Hx     Current Outpatient Medications on File Prior to Visit  Medication Sig Dispense Refill  . acetaminophen (TYLENOL) 500  MG tablet Take 500 mg by mouth every 6 (six) hours as needed for moderate pain.     [name] albuterol (VENTOLIN HFA) 108 (90 Base) MCG/ACT inhaler Inhale 2 puffs into the lungs every 4 (four) hours as needed. 18 g 5  . diltiazem (CARDIZEM) 60 MG tablet TAKE 1 TABLET TWICE A DAY 180 tablet 1  . ELIQUIS 5 MG TABS tablet TAKE 1 TABLET BY MOUTH TWICE A DAY 60 tablet 6  . fexofenadine (ALLEGRA) 180 MG tablet Take 180 mg by mouth daily.    . fluticasone (FLONASE) 50 MCG/ACT nasal spray Place 2 sprays into  both nostrils daily. 16 g 5  . lisinopril (ZESTRIL) 20 MG tablet Take 1 tablet (20 mg total) by mouth daily. 90 tablet 3  . Multiple Vitamin (MULTIVITAMIN) tablet Take 1 tablet by mouth daily.    . rosuvastatin (CRESTOR) 40 MG tablet TAKE 1 TABLET DAILY (STOP  ATORVASTATIN) 90 tablet 1  . [name] ELLIPTA 100-62.5-25 MCG/INH AEPB TAKE 1 PUFF BY MOUTH EVERY DAY 60 each 12  . vitamin C (ASCORBIC ACID) 500 MG tablet Take 500 mg by mouth daily.      No current facility-administered medications on file prior to visit.    Allergies  Allergen Reactions  . Ciprofloxacin     Artificial heart valve    Social History   Substance and Sexual Activity  Alcohol Use Yes  . Alcohol/week: 5.0 standard drinks  . Types: 5 Cans of beer per week   Comment: per pt    Social History   Tobacco Use  Smoking Status Former Smoker  . Packs/day: 1.50  . Years: 40.00  . Pack years: 60.00  . Types: Cigarettes  . Quit date: 04/16/2002  . Years since quitting: 18.1  Smokeless Tobacco Never Used    Review of Systems  Constitutional: Negative.   HENT: Negative.   Eyes: Negative.   Respiratory: Positive for shortness of breath.   Cardiovascular: Negative.   Gastrointestinal: Negative.   Genitourinary: Negative.   Musculoskeletal: Positive for joint pain and neck pain.  Skin:       Boils  Neurological: Negative.   Endo/Heme/Allergies: Negative.   Psychiatric/Behavioral: Negative.     Objective   Vitals:   06/04/20 0949  BP: (!) 150/87  Pulse: 87  Resp: 14  Temp: 98.7 F (37.1 C)  SpO2: 91%    Physical Exam Vitals reviewed.  Constitutional:      Appearance: Normal appearance. He is normal weight. He is not ill-appearing.  HENT:     Head: Normocephalic and atraumatic.  Neck:     Comments: Small less than 1 cm mobile sebaceous cyst right posterior neck with small punctum present.  No erythema or fluctuance present. Cardiovascular:     Rate and Rhythm: Normal rate and regular rhythm.      Heart sounds: Normal heart sounds. No murmur heard. No friction rub. No gallop.   Pulmonary:     Effort: Pulmonary effort is normal. No respiratory distress.     Breath sounds: Normal breath sounds. No stridor. No wheezing, rhonchi or rales.  Musculoskeletal:     Cervical back: Neck supple.  Lymphadenopathy:     Cervical: No cervical adenopathy.  Skin:    General: Skin is warm and dry.  Neurological:     Mental Status: He is alert and oriented to person, place, and time.   Primary care notes reviewed  Assessment  Sebaceous cyst, neck. Plan   I told the patient that I  can remove the sebaceous cyst should not be causing him pain or had as got infected.  Patient states that it is not bothering him.  No need for surgical intervention at this time.  He was instructed to return to my care should any problems arise from the sebaceous cyst.  He understands and agrees.  Follow-up here as needed.

## 2020-06-04 NOTE — Patient Instructions (Signed)
Epidermoid Cyst  An epidermoid cyst, also called an epidermal cyst, is a small lump under your skin. The cyst contains a substance called keratin. Do not try to pop or open the cyst yourself. What are the causes?  A blocked hair follicle.  A hair that curls and re-enters the skin instead of growing straight out of the skin.  A blocked pore.  Irritated skin.  An injury to the skin.  Certain conditions that are passed along from parent to child.  Human papillomavirus (HPV). This happens rarely when cysts occur on the bottom of the feet.  Long-term sun damage to the skin. What increases the risk?  Having acne.  Being male.  Having an injury to the skin.  Being past puberty.  Having certain conditions caused by genes (genetic disorder) What are the signs or symptoms? These cysts are usually harmless, but they can get infected. Symptoms of infection may include:  Redness.  Inflammation.  Tenderness.  Warmth.  Fever.  A bad-smelling substance that drains from the cyst.  Pus that drains from the cyst. How is this treated? In many cases, epidermoid cysts go away on their own without treatment. If a cyst becomes infected, treatment may include:  Opening and draining the cyst, done by a doctor. After draining, you may need minor surgery to remove the rest of the cyst.  Antibiotic medicine.  Shots of medicines (steroids) that help to reduce inflammation.  Surgery to remove the cyst. Surgery may be done if the cyst: ? Becomes large. ? Bothers you. ? Has a chance of turning into cancer.  Do not try to open a cyst yourself. Follow these instructions at home: Medicines  Take over-the-counter and prescription medicines as told by your doctor.  If you were prescribed an antibiotic medicine, take it as told by your doctor. Do not stop taking it even if you start to feel better. General instructions  Keep the area around your cyst clean and dry.  Wear loose, dry  clothing.  Avoid touching your cyst.  Check your cyst every day for signs of infection. Check for: ? Redness, swelling, or pain. ? Fluid or blood. ? Warmth. ? Pus or a bad smell.  Keep all follow-up visits. How is this prevented?  Wear clean, dry, clothing.  Avoid wearing tight clothing.  Keep your skin clean and dry. Take showers or baths every day. Contact a doctor if:  Your cyst has symptoms of infection.  Your condition does not improve or gets worse.  You have a cyst that looks different from other cysts you have had.  You have a fever. Get help right away if:  Redness spreads from the cyst into the area close by. Summary  An epidermoid cyst is a small lump under your skin.  If a cyst becomes infected, treatment may include surgery to open and drain the cyst, or to remove it.  Take over-the-counter and prescription medicines only as told by your doctor.  Contact a doctor if your condition is not improving or is getting worse.  Keep all follow-up visits. This information is not intended to replace advice given to you by your health care provider. Make sure you discuss any questions you have with your health care provider. Document Revised: 05/01/2019 Document Reviewed: 05/01/2019 Elsevier Patient Education  2021 Elsevier Inc.  

## 2020-09-26 ENCOUNTER — Other Ambulatory Visit: Payer: Self-pay | Admitting: Family Medicine

## 2020-10-25 ENCOUNTER — Other Ambulatory Visit: Payer: Self-pay | Admitting: Cardiology

## 2020-10-26 NOTE — Telephone Encounter (Signed)
Prescription refill request for Eliquis received. Indication: PAF Last office visit: 11/18/19  Ival Bible MD Scr: 0.71 on 04/24/20 Age:  70 Weight: 86.5kg  Based on above findings Eliquis 5mg  twice daily is the appropriate dose.  Refill approved.

## 2020-10-27 ENCOUNTER — Ambulatory Visit (INDEPENDENT_AMBULATORY_CARE_PROVIDER_SITE_OTHER): Payer: Medicare Other | Admitting: Family Medicine

## 2020-10-27 ENCOUNTER — Encounter: Payer: Self-pay | Admitting: Family Medicine

## 2020-10-27 ENCOUNTER — Other Ambulatory Visit: Payer: Self-pay

## 2020-10-27 VITALS — BP 138/84 | HR 88 | Temp 97.3°F | Wt 185.2 lb

## 2020-10-27 DIAGNOSIS — R0902 Hypoxemia: Secondary | ICD-10-CM

## 2020-10-27 DIAGNOSIS — Z125 Encounter for screening for malignant neoplasm of prostate: Secondary | ICD-10-CM

## 2020-10-27 DIAGNOSIS — J449 Chronic obstructive pulmonary disease, unspecified: Secondary | ICD-10-CM

## 2020-10-27 DIAGNOSIS — E785 Hyperlipidemia, unspecified: Secondary | ICD-10-CM | POA: Diagnosis not present

## 2020-10-27 DIAGNOSIS — I10 Essential (primary) hypertension: Secondary | ICD-10-CM | POA: Diagnosis not present

## 2020-10-27 DIAGNOSIS — Z23 Encounter for immunization: Secondary | ICD-10-CM | POA: Diagnosis not present

## 2020-10-27 DIAGNOSIS — Z79899 Other long term (current) drug therapy: Secondary | ICD-10-CM | POA: Diagnosis not present

## 2020-10-27 NOTE — Addendum Note (Signed)
Addended by: Margaretha Sheffield on: 10/27/2020 02:16 PM   Modules accepted: Orders

## 2020-10-27 NOTE — Progress Notes (Signed)
10/27/20- equipment order form printed off from [employer] [facility] and filled out; placed on provider door for signature.

## 2020-10-27 NOTE — Progress Notes (Signed)
Referral ordered in EPIC. 

## 2020-10-27 NOTE — Progress Notes (Signed)
[facility]  Subjective:    Patient ID: [name], male    DOB: July 12, 1950, 70 y.o.   MRN: [id number]  HPI Pt has been having more flare ups on COPD here recently. Pt has been finding that he is more short of breath if walking long distances.   At home pt is fine but out in public if in crowd, does not do so well. Pt wife states quality of life is going down.   High risk medication use - Plan: PSA, Basic Metabolic Panel (BMET), Lipid Profile  Hyperlipidemia, unspecified hyperlipidemia type - Plan: PSA, Basic Metabolic Panel (BMET), Lipid Profile  Screening PSA (prostate specific antigen) - Plan: PSA, Basic Metabolic Panel (BMET), Lipid Profile  Essential hypertension - Plan: PSA, Basic Metabolic Panel (BMET), Lipid Profile  Need for vaccination - Plan: Flu Vaccine QUAD High Dose(Fluad)  Hypoxic  COPD, severe ([place])  Patient relates that he gets out of breath when he moves around.  Having to stop doing things because of shortness of breath.  He is using his medicine as directed.  Relates despite this having difficult times.  He relates that it takes him several minutes to recover from shortness of breath  He does take his heart medicine and blood thinner without bleeding issues.  Takes his blood pressure medicine and cholesterol denies any setbacks with that  Patient for blood pressure check up.  The patient does have hypertension.    Medication compliance-good compliance  Blood pressure control recently-most of the time blood pressure is good  Dietary compliance-tries minimize salt  Patient here for follow-up regarding cholesterol.    Diet-tries stay away from fried foods  Compliance with medicine-takes his medicine  Side effects-denies side effects  Activity-limited activity because of COPD  Regular lab work regarding lipid and liver was checked and if needing additional labs was appropriately ordered  Review of Systems     Objective:   Physical  Exam  General-in no acute distress Eyes-no discharge Lungs-respiratory rate normal, CTA CV-no murmurs,RRR Extremities skin warm dry no edema Neuro grossly normal Behavior normal, alert       Assessment & Plan:  1. High risk medication use Labs ordered to be done before next visit - PSA - Basic Metabolic Panel (BMET) - Lipid Profile  2. Hyperlipidemia, unspecified hyperlipidemia type Cholesterol profile continue medication - PSA - Basic Metabolic Panel (BMET) - Lipid Profile  3. Screening PSA (prostate specific antigen) Labs ordered - PSA - Basic Metabolic Panel (BMET) - Lipid Profile  4. Essential hypertension Continue medication check met 7 - PSA - Basic Metabolic Panel (BMET) - Lipid Profile  5. Need for vaccination Flu shot today high-dose - Flu Vaccine QUAD High Dose(Fluad)  6. Hypoxic O2 saturation 88% on room air.  When he walks it drops to 72%.  Take several minutes to recover.  7. COPD, severe ([place]) Severe COPD Recommend referral to pulmonary.

## 2020-10-28 ENCOUNTER — Telehealth: Payer: Self-pay

## 2020-10-28 NOTE — Telephone Encounter (Signed)
Per - [name], MD Patient will need oxygen for use when walking because of hypoxia with walking see dictated note, will need to utilize whoever his Medicare provider uses for oxygen if additional documentation is necessary please let me know thank you    -- spoke with patient and informed him per drs notes, patient will return call to let us know      His insurance providers's oxygen company preference

## 2020-10-29 ENCOUNTER — Telehealth: Payer: Self-pay | Admitting: Family Medicine

## 2020-10-29 NOTE — Telephone Encounter (Signed)
Please clarify directions for oxygen use. Will this be through nasal cannula or mask? Please advise. Thank you

## 2020-10-29 NOTE — Telephone Encounter (Signed)
Patient would like to talk to the nurse about his recent prescription for oxygen.   CB#  [phone]

## 2020-10-29 NOTE — Telephone Encounter (Signed)
Patient states he would like to get oxygen local thru [place]- he said a company from [place] called him today wanting his credit card info and he wasn't comfortable with that.  Please give directions for oxygen usage to fax to [facility]

## 2020-10-29 NOTE — Telephone Encounter (Signed)
I am perfectly fine with using [employer] but his insurance carrier may require utilizing the entity that called him (These type of rules go well beyond my control) If the patient still wants [employer] go ahead

## 2020-10-30 NOTE — Telephone Encounter (Signed)
Script printed out; on provider door for signature. Will fax once signed along with demographic and recent office note. Please advise. Thank you

## 2020-10-30 NOTE — Telephone Encounter (Signed)
Patient has exertional hypoxia which corrects with oxygen I recommend 2 L per nasal cannula with activity both portable oxygen tank as well as home O2 concentrator with a long tube

## 2020-10-30 NOTE — Telephone Encounter (Signed)
Ready

## 2020-10-30 NOTE — Telephone Encounter (Signed)
Script/recent office note/demographics faxed to [facility]

## 2020-11-02 ENCOUNTER — Telehealth: Payer: Self-pay | Admitting: Family Medicine

## 2020-11-02 NOTE — Progress Notes (Signed)
11/02/20- script/demographic info sent to [facility] on 10/30/20

## 2020-11-02 NOTE — Telephone Encounter (Signed)
Patient has a question about his oxygen prescription.   CB#  [phone]

## 2020-11-02 NOTE — Telephone Encounter (Signed)
Pt contacted. Pt states he received a call from someone named Amy and she began to ask for credit card information so they could set up a payment plan. Informed pt that script was sent to [facility] before finding out that pt wanted to use [employer]. Script faxed to [facility] 10/30/20. Pt verbalized understanding

## 2020-11-05 ENCOUNTER — Telehealth: Payer: Self-pay | Admitting: Family Medicine

## 2020-11-05 NOTE — Telephone Encounter (Signed)
Autumn spoke with [employer]; states they do not have order. Order refaxed to [employer].

## 2020-11-05 NOTE — Telephone Encounter (Signed)
Patient states has been trying for 2 weeks to get his oxygen from [place]. He was seen 9/20. Please advise

## 2020-11-12 ENCOUNTER — Telehealth: Payer: Self-pay | Admitting: Family Medicine

## 2020-11-12 NOTE — Telephone Encounter (Signed)
Nurses Please find out where he gets his oxygen from He may have prescription order for portable oxygen to be sent (Sometimes they have smaller tanks, sometimes they have dollies that the oxygen tank can go into to wheel around, or in some situations the oxygen provider can set up a portable concentrator, I would recommend that we give the prescription and have him talk with the oxygen provider to see what they can do for him that will meet his needs-also there can be some issues regarding what insurance will cover as well-if there is any additional needs from the patient let us know)

## 2020-11-12 NOTE — Telephone Encounter (Signed)
Pt received oxygen supplies from [employer]. Script written out and on provider door for signature. Will fax to [facility] once faxed.

## 2020-11-12 NOTE — Telephone Encounter (Signed)
Please advise. Thank you

## 2020-11-12 NOTE — Telephone Encounter (Signed)
Patient was seen on 9/20 given a script for oxygen for walking. States that he is unable to use prescribed tank for walking because it is too big. He asks that Dr. Lorin Picket prescribe one that is portable.   CB#  [phone]

## 2020-11-13 NOTE — Telephone Encounter (Signed)
Script faxed to [facility] 

## 2020-11-24 DIAGNOSIS — I10 Essential (primary) hypertension: Secondary | ICD-10-CM | POA: Diagnosis not present

## 2020-11-24 DIAGNOSIS — E785 Hyperlipidemia, unspecified: Secondary | ICD-10-CM | POA: Diagnosis not present

## 2020-11-24 DIAGNOSIS — Z79899 Other long term (current) drug therapy: Secondary | ICD-10-CM | POA: Diagnosis not present

## 2020-11-25 LAB — BASIC METABOLIC PANEL
BUN/Creatinine Ratio: 16 (ref 10–24)
BUN: 10 mg/dL (ref 8–27)
CO2: 24 mmol/L (ref 20–29)
Calcium: 9.7 mg/dL (ref 8.6–10.2)
Chloride: 95 mmol/L — ABNORMAL LOW (ref 96–106)
Creatinine, Ser: 0.62 mg/dL — ABNORMAL LOW (ref 0.76–1.27)
Glucose: 87 mg/dL (ref 70–99)
Potassium: 4.8 mmol/L (ref 3.5–5.2)
Sodium: 137 mmol/L (ref 134–144)
eGFR: 104 mL/min/{1.73_m2} (ref >59)

## 2020-11-25 LAB — LIPID PANEL
Chol/HDL Ratio: 2.8 ratio (ref 0.0–5.0)
Cholesterol, Total: 169 mg/dL (ref 100–199)
HDL: 60 mg/dL (ref >39)
LDL Chol Calc (NIH): 74 mg/dL (ref 0–99)
Triglycerides: 211 mg/dL — ABNORMAL HIGH (ref 0–149)
VLDL Cholesterol Cal: 35 mg/dL (ref 5–40)

## 2020-11-25 LAB — PSA: Prostate Specific Ag, Serum: 2.4 ng/mL (ref 0.0–4.0)

## 2020-12-01 ENCOUNTER — Ambulatory Visit (INDEPENDENT_AMBULATORY_CARE_PROVIDER_SITE_OTHER): Payer: Medicare Other | Admitting: Family Medicine

## 2020-12-01 ENCOUNTER — Other Ambulatory Visit: Payer: Self-pay

## 2020-12-01 ENCOUNTER — Encounter: Payer: Self-pay | Admitting: Family Medicine

## 2020-12-01 VITALS — BP 114/70 | HR 90 | Temp 97.7°F | Wt 186.4 lb

## 2020-12-01 DIAGNOSIS — R0902 Hypoxemia: Secondary | ICD-10-CM

## 2020-12-01 DIAGNOSIS — I480 Paroxysmal atrial fibrillation: Secondary | ICD-10-CM | POA: Diagnosis not present

## 2020-12-01 DIAGNOSIS — J449 Chronic obstructive pulmonary disease, unspecified: Secondary | ICD-10-CM

## 2020-12-01 DIAGNOSIS — E785 Hyperlipidemia, unspecified: Secondary | ICD-10-CM | POA: Diagnosis not present

## 2020-12-01 MED ORDER — ROSUVASTATIN CALCIUM 40 MG PO TABS: ORAL_TABLET | ORAL | 1 refills | Status: DC

## 2020-12-01 MED ORDER — DILTIAZEM HCL 60 MG PO TABS: ORAL_TABLET | ORAL | 1 refills | Status: DC

## 2020-12-01 NOTE — Progress Notes (Signed)
   Subjective:    Patient ID: [name], male    DOB: [birth year], 70 y.o.   MRN: [id number]  HPI Pt here for follow up. Pt states he is doing ok but has a rash on his back. Doesn't really bother him that bad but does itch sometime.  Pt recently received oxygen and is doing well.   COPD with hypoxia (HCC)  Hyperlipidemia, unspecified hyperlipidemia type  Paroxysmal atrial fibrillation (HCC)  Review of Systems     Objective:   Physical Exam  General-in no acute distress Eyes-no discharge Lungs-respiratory rate normal, CTA CV-no murmurs Extremities skin warm dry no edema Neuro grossly normal Behavior normal, alert       Assessment & Plan:   Heart rate controlled tolerating Eliquis well no bleeding issues  COPD with hypoxia using oxygen and is benefiting him continue current measures he will see pulmonary to make sure there is not other measures that need to be completed.  Patient quit smoking over 15 years ago.  Hyperlipidemia continue current medication.  Follow-up by March or April sooner if any issues

## 2020-12-03 ENCOUNTER — Other Ambulatory Visit: Payer: Self-pay | Admitting: Family Medicine

## 2020-12-04 ENCOUNTER — Telehealth: Payer: Medicare Other | Admitting: Physician Assistant

## 2020-12-04 DIAGNOSIS — J0100 Acute maxillary sinusitis, unspecified: Secondary | ICD-10-CM

## 2020-12-04 DIAGNOSIS — J069 Acute upper respiratory infection, unspecified: Secondary | ICD-10-CM

## 2020-12-04 MED ORDER — IPRATROPIUM BROMIDE 0.03 % NA SOLN: 2.0000 | Freq: Two times a day (BID) | NASAL | 0 refills | Status: DC

## 2020-12-04 MED ORDER — BENZONATATE 100 MG PO CAPS: 100.0000 mg | ORAL_CAPSULE | Freq: Three times a day (TID) | ORAL | 0 refills | Status: DC | PRN

## 2020-12-04 NOTE — Progress Notes (Signed)

## 2020-12-05 MED ORDER — AMOXICILLIN-POT CLAVULANATE 875-125 MG PO TABS: 1.0000 | ORAL_TABLET | Freq: Two times a day (BID) | ORAL | 0 refills | Status: AC

## 2020-12-05 NOTE — Addendum Note (Signed)
Addended by: Rennis Harding on: 12/05/2020 03:08 PM   Modules accepted: Orders

## 2020-12-05 NOTE — Progress Notes (Signed)

## 2020-12-23 ENCOUNTER — Other Ambulatory Visit: Payer: Self-pay

## 2020-12-23 DIAGNOSIS — I714 Abdominal aortic aneurysm, without rupture, unspecified: Secondary | ICD-10-CM

## 2020-12-29 ENCOUNTER — Ambulatory Visit: Payer: Medicare Other | Admitting: Internal Medicine

## 2020-12-29 ENCOUNTER — Other Ambulatory Visit: Payer: Self-pay

## 2020-12-29 ENCOUNTER — Encounter: Payer: Self-pay | Admitting: Internal Medicine

## 2020-12-29 DIAGNOSIS — J9611 Chronic respiratory failure with hypoxia: Secondary | ICD-10-CM

## 2020-12-29 DIAGNOSIS — R0902 Hypoxemia: Secondary | ICD-10-CM | POA: Diagnosis not present

## 2020-12-29 DIAGNOSIS — J449 Chronic obstructive pulmonary disease, unspecified: Secondary | ICD-10-CM | POA: Diagnosis not present

## 2020-12-29 DIAGNOSIS — I10 Essential (primary) hypertension: Secondary | ICD-10-CM

## 2020-12-29 MED ORDER — BREZTRI AEROSPHERE 160-9-4.8 MCG/ACT IN AERO: INHALATION_SPRAY | RESPIRATORY_TRACT | 11 refills | Status: DC

## 2020-12-29 MED ORDER — BREZTRI AEROSPHERE 160-9-4.8 MCG/ACT IN AERO: 2.0000 | INHALATION_SPRAY | Freq: Two times a day (BID) | RESPIRATORY_TRACT | 0 refills | Status: DC

## 2020-12-29 MED ORDER — OLMESARTAN MEDOXOMIL 20 MG PO TABS
20.0000 mg | ORAL_TABLET | Freq: Every day | ORAL | 11 refills | Status: DC
Start: 2020-12-29 — End: 2021-01-21

## 2020-12-29 NOTE — Assessment & Plan Note (Signed)
Trial off acei and on ARB 12/29/2020 for atypical copd with mild pseudowheeze  In the best review of chronic cough to date ( NEJM 2016 375 [id number]) ,  ACEi are now felt to cause cough in up to  20% of pts which is a 4 fold increase from previous reports and does not include the variety of non-specific complaints we see in pulmonary clinic in pts on ACEi but previously attributed to another dx like  Copd/asthma and  include PNDS, throat and chest congestion, "bronchitis", unexplained dyspnea and noct "strangling" sensations, and hoarseness, but also  atypical /refractory GERD symptoms like dysphagia and "bad heartburn"   The only way I know  to prove this is not an "ACEi Case" is a trial off ACEi x a minimum of 6 weeks then regroup.   >>> try benicar 20 mg daily and f/u in 6 weeks

## 2020-12-29 NOTE — Assessment & Plan Note (Addendum)
Onset of symptoms  Around 2018  - PFT's  12/29/2020  FEV1 1.09 (32 % ) ratio 0.35  p 1 % improvement from saba p ? prior to study with DLCO  12.41 (40%) corrects to 2.51 (55%)  for alv volume and FV curve classicall concave  - 12/29/2020  After extensive coaching inhaler device,  effectiveness =    75% > breztri trial and off acei Labs ordered 12/29/2020  :  allergy profile   alpha one AT phenotype     When respiratory symptoms begin or become refractory well after a patient reports complete smoking cessation,  Especially when this wasn't reported to be  the case while they were smoking, a red flag is raised based on the work of Dr Primitivo Gauze which states:  if you quit smoking when your best day ativity tolerance is  preserved it is highly unlikely you will progress to severe disease.  That is to say, once the smoking stops,  the symptoms should not suddenly erupt or markedly worsen.  If so, the differential diagnosis should include  obesity/deconditioning,  LPR/Reflux/Aspiration syndromes,  occult CHF, or  especially side effect of medications commonly used in this population.  (acei and dpi could be examples here, but only way to know is trial off)

## 2020-12-29 NOTE — Progress Notes (Signed)
[name], male    DOB: 1950/03/05,    MRN: [id number]   Brief patient profile:  52 yowm quit smoking 2004 s much resp cc  referred to pulmonary clinic in [place]  12/29/2020 by Dr  [name] for copd GOLD 3 dx 2018 / newly 02 rx x around 10/2020  Pt is s/p AVR 08/2016      History of Present Illness  12/29/2020  Pulmonary/ 1st office eval/ [name] / [name] / [name]  Chief Complaint  Patient presents with   Consult    COPD for 15 years. Diagnosed by Dr. Lilyan [name].   2LO2 pulse portable oxygen. 2LO2 cont. At home and while sleeping. Has been on O2 for 2 months.   Dyspnea:  2lpm and 30 yards to street and back but does not check 02  Cough: none Sleep: 2lpm hs /flat  SABA use: rarely = once a week hfa  No obvious day to day or daytime variability or assoc excess/ purulent sputum or mucus plugs or hemoptysis or cp or chest tightness, subjective wheeze or overt sinus or hb symptoms.   Sleeping as above  without nocturnal  or early am exacerbation  of respiratory  c/o's or need for noct saba. Also denies any obvious fluctuation of symptoms with weather or environmental changes or other aggravating or alleviating factors except as outlined above   No unusual exposure hx or h/o childhood pna/ asthma or knowledge of premature birth.  Current Allergies, Complete Past Medical History, Past Surgical History, Family History, and Social History were reviewed in [name] record.  ROS  The following are not active complaints unless bolded Hoarseness, sore throat, dysphagia, dental problems, itching, sneezing,  nasal congestion or discharge of excess mucus or purulent secretions, ear ache,   fever, chills, sweats, unintended wt loss or wt gain, classically pleuritic or exertional cp,  orthopnea pnd or arm/hand swelling  or leg swelling, presyncope, palpitations, abdominal pain, anorexia, nausea, vomiting, diarrhea  or change in bowel habits or change in  bladder habits, change in stools or change in urine, dysuria, hematuria,  rash, arthralgias, visual complaints, headache, numbness, weakness or ataxia or problems with walking or coordination,  change in mood or  memory.           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   Hyperlipidemia    [facility])    Postoperative atrial fibrillation (HCC) 2018   White coat hypertension     Outpatient Medications Prior to Visit  Medication Sig Dispense Refill   acetaminophen (TYLENOL) 500 MG tablet Take 500 mg by mouth every 6 (six) hours as needed for moderate pain.      albuterol (VENTOLIN HFA) 108 (90 Base) MCG/ACT inhaler INHALE 2 PUFFS BY MOUTH EVERY 4 HOURS AS NEEDED 18 each 5   diltiazem (CARDIZEM) 60 MG tablet TAKE 1 TABLET BY MOUTH TWICE A DAY 180 tablet 1   ELIQUIS 5 MG TABS tablet TAKE 1 TABLET BY MOUTH TWICE A DAY 60 tablet 6   fexofenadine (ALLEGRA) 180  MG tablet Take 180 mg by mouth daily.     fluticasone (FLONASE) 50 MCG/ACT nasal spray SPRAY 2 SPRAYS INTO EACH NOSTRIL EVERY DAY 16 mL 5   lisinopril (ZESTRIL) 20 MG tablet Take 1 tablet (20 mg total) by mouth daily. 90 tablet 3   Multiple Vitamin (MULTIVITAMIN) tablet Take 1 tablet by mouth daily.     rosuvastatin (CRESTOR) 40 MG tablet 1 qd 90 tablet 1   [name] ELLIPTA 100-62.5-25 MCG/INH AEPB TAKE 1 PUFF BY MOUTH EVERY DAY 60 each 12   vitamin C (ASCORBIC ACID) 500 MG tablet Take 500 mg by mouth daily.      benzonatate (TESSALON) 100 MG capsule Take 1 capsule (100 mg total) by mouth 3 (three) times daily as needed. 30 capsule 0   ipratropium (ATROVENT) 0.03 % nasal spray  Place 2 sprays into both nostrils every 12 (twelve) hours. 30 mL 0   No facility-administered medications prior to visit.     Objective:     BP (!) 144/88 (BP Location: Left Arm, Patient Position: Sitting)   Pulse 89   Temp 98.4 F (36.9 C) (Temporal)   Ht 5\' 9"  (1.753 m)   Wt 181 lb 1.9 oz (82.2 kg)   SpO2 96% Comment: 2 lpm pulse  BMI 26.75 kg/m   SpO2: 96 % (2 lpm pulse) and 89% RA  Amb hoarse wm mild pseudowheeze   HEENT : pt wearing mask not removed for exam due to covid - 19 concerns.    NECK :  without JVD/Nodes/TM/ nl carotid upstrokes bilaterally   LUNGS: no acc muscle use,  Mild barrel  contour chest wall with bilateral  Distant bs s audible wheeze and  without cough on insp or exp maneuvers  and mild  Hyperresonant  to  percussion bilaterally     CV:  RRR  no s3 or murmur or increase in P2, and no edema   ABD:  soft and nontender with pos end  insp Hoover's  in the supine position. No bruits or organomegaly appreciated, bowel sounds nl  MS:   Nl gait/  ext warm without deformities, calf tenderness, cyanosis or clubbing No obvious joint restrictions   SKIN: warm and dry without lesions    NEURO:  alert, approp, nl sensorium with  no motor or cerebellar deficits apparent.      CXR PA and Lateral:   12/29/2020 :     Pt did not go for cxr as rec     Labs ordered 12/29/2020  :  allergy profile   alpha one AT phenotype      Assessment   COPD GOLD 3 with hypoxia ([place]) Onset of symptoms  Around 2018  - PFT's  12/29/2020  FEV1 1.09 (32 % ) ratio 0.35  p 1 % improvement from saba p ? prior to study with DLCO  12.41 (40%) corrects to 2.51 (55%)  for alv volume and FV curve classicall concave  - 12/29/2020  After extensive coaching inhaler device,  effectiveness =    75% > breztri trial and off acei Labs ordered 12/29/2020  :  allergy profile   alpha one AT phenotype     When respiratory symptoms begin or become refractory well after a patient reports complete  smoking cessation,  Especially when this wasn't reported to be  the case while they were smoking, a red flag is raised based on the work of Dr [name] which states:  if you quit smoking when your best day ativity  tolerance is  preserved it is highly unlikely you will progress to severe disease.  That is to say, once the smoking stops,  the symptoms should not suddenly erupt or markedly worsen.  If so, the differential diagnosis should include  obesity/deconditioning,  LPR/Reflux/Aspiration syndromes,  occult CHF, or  especially side effect of medications commonly used in this population.  (acei and dpi could be examples here, but only way to know is trial off)    Essential hypertension Trial off acei and on ARB 12/29/2020 for atypical copd with mild pseudowheeze  In the best review of chronic cough to date ( NEJM 2016 375 [id number]) ,  ACEi are now felt to cause cough in up to  20% of pts which is a 4 fold increase from previous reports and does not include the variety of non-specific complaints we see in pulmonary clinic in pts on ACEi but previously attributed to another dx like  Copd/asthma and  include PNDS, throat and chest congestion, "bronchitis", unexplained dyspnea and noct "strangling" sensations, and hoarseness, but also  atypical /refractory GERD symptoms like dysphagia and "bad heartburn"   The only way I know  to prove this is not an "ACEi Case" is a trial off ACEi x a minimum of 6 weeks then regroup.   >>> try benicar 20 mg daily and f/u in 6 weeks    Chronic respiratory failure with hypoxia (HCC) Placed on 02 around 10/2020 - 12/29/2020   Walked on 2lpm pulsed x  3  lap(s) =  approx 450 @ moderate then slower pace, stopped due to end of study with onset of doe during 2nd with lowest 02 sats 90%   Not clear why desats now but this may not actually be a new finding as many pts with stable copd can't "feel" any change in 02 sats and supplementing doesn't really seem to have impact  on morbidity or mortality.   For now rec Make sure you check your oxygen saturation  AT  your highest level of activity (not after you stop)   to be sure it stays over 90% and adjust  02 flow upward to maintain this level if needed but remember to turn it back to previous settings when you stop (to conserve your supply).    Each maintenance medication was reviewed in detail including emphasizing most importantly the difference between maintenance and prns and under what circumstances the prns are to be triggered using an action plan format where appropriate.  Total time for H and P, chart review, counseling, reviewing hfa/02 device(s) , directly observing portions of ambulatory 02 saturation study/ and generating customized AVS unique to this office visit / same day charting  > 60 min                   Sandrea Hughs, MD 12/29/2020

## 2020-12-29 NOTE — Assessment & Plan Note (Signed)
Placed on 02 around 10/2020 - 12/29/2020   Walked on 2lpm pulsed x  3  lap(s) =  approx 450 @ moderate then slower pace, stopped due to end of study with onset of doe during 2nd with lowest 02 sats 90%   Not clear why desats now but this may not actually be a new finding as many pts with stable copd can't "feel" any change in 02 sats and supplementing doesn't really seem to have impact on morbidity or mortality.   For now rec Make sure you check your oxygen saturation  AT  your highest level of activity (not after you stop)   to be sure it stays over 90% and adjust  02 flow upward to maintain this level if needed but remember to turn it back to previous settings when you stop (to conserve your supply).    Each maintenance medication was reviewed in detail including emphasizing most importantly the difference between maintenance and prns and under what circumstances the prns are to be triggered using an action plan format where appropriate.  Total time for H and P, chart review, counseling, reviewing hfa/02 device(s) , directly observing portions of ambulatory 02 saturation study/ and generating customized AVS unique to this office visit / same day charting  > 60 min

## 2020-12-29 NOTE — Patient Instructions (Addendum)
Stop trelegy and lisinopril   Benicar (olemasartan) 20 mg one daily   Plan A = Automatic = Always=  breztri Take 2 puffs first thing in am and then another 2 puffs about 12 hours later.    Work on inhaler technique:  relax and gently blow all the way out then take a nice smooth full deep breath back in, triggering the inhaler at same time you start breathing in.  Hold for up to 5 seconds if you can. Blow out thru nose. Rinse and gargle with water when done.  If mouth or throat bother you at all,  try brushing teeth/gums/tongue with arm and hammer toothpaste/ make a slurry and gargle and spit out.      Plan B = Backup (to supplement plan A, not to replace it) Only use your albuterol inhaler as a rescue medication to be used if you can't catch your breath by resting or doing a relaxed purse lip breathing pattern.  - The less you use it, the better it will work when you need it. - Ok to use the inhaler up to 2 puffs  every 4 hours if you must but call for appointment if use goes up over your usual need - Don't leave home without it !!  (think of it like the spare tire for your car)     Make sure you check your oxygen saturation  AT  your highest level of activity (not after you stop)   to be sure it stays over 90% and adjust  02 flow upward to maintain this level if needed but remember to turn it back to previous settings when you stop (to conserve your supply).   Please schedule a follow up office visit in 6 weeks, call sooner if needed with all medications /inhalers/ solutions in hand so we can verify exactly what you are taking. This includes all medications from all doctors and over the counters

## 2021-01-14 ENCOUNTER — Other Ambulatory Visit: Payer: Self-pay

## 2021-01-14 ENCOUNTER — Telehealth: Payer: Self-pay | Admitting: Internal Medicine

## 2021-01-14 ENCOUNTER — Ambulatory Visit (HOSPITAL_COMMUNITY)
Admission: RE | Admit: 2021-01-14 | Discharge: 2021-01-14 | Disposition: A | Discharge status: Home / Self Care | Payer: Medicare Other | Source: Ambulatory Visit | Attending: Internal Medicine | Admitting: Internal Medicine

## 2021-01-14 DIAGNOSIS — J9611 Chronic respiratory failure with hypoxia: Secondary | ICD-10-CM

## 2021-01-14 DIAGNOSIS — R0902 Hypoxemia: Secondary | ICD-10-CM | POA: Insufficient documentation

## 2021-01-14 DIAGNOSIS — J449 Chronic obstructive pulmonary disease, unspecified: Secondary | ICD-10-CM

## 2021-01-14 NOTE — Telephone Encounter (Signed)
Labs needed to be changed from future to normal. Labs changed. [name] notified. Nothing further needed.

## 2021-01-18 LAB — BASIC METABOLIC PANEL
BUN/Creatinine Ratio: 16 (ref 10–24)
BUN: 9 mg/dL (ref 8–27)
CO2: 24 mmol/L (ref 20–29)
Calcium: 9.7 mg/dL (ref 8.6–10.2)
Chloride: 95 mmol/L — ABNORMAL LOW (ref 96–106)
Creatinine, Ser: 0.55 mg/dL — ABNORMAL LOW (ref 0.76–1.27)
Glucose: 81 mg/dL (ref 70–99)
Potassium: 4.6 mmol/L (ref 3.5–5.2)
Sodium: 138 mmol/L (ref 134–144)
eGFR: 108 mL/min/{1.73_m2} (ref >59)

## 2021-01-18 LAB — ALPHA-1-ANTITRYPSIN PHENOTYP: A-1 Antitrypsin: 145 mg/dL (ref 101–187)

## 2021-01-18 LAB — CBC WITH DIFFERENTIAL/PLATELET
Basophils Absolute: 0.1 10*3/uL (ref 0.0–0.2)
Basos: 1 %
EOS (ABSOLUTE): 0.1 10*3/uL (ref 0.0–0.4)
Eos: 1 %
Hematocrit: 46.1 % (ref 37.5–51.0)
Hemoglobin: 15.5 g/dL (ref 13.0–17.7)
Immature Grans (Abs): 0 10*3/uL (ref 0.0–0.1)
Immature Granulocytes: 0 %
Lymphocytes Absolute: 1.2 10*3/uL (ref 0.7–3.1)
Lymphs: 14 %
MCH: 31.9 pg (ref 26.6–33.0)
MCHC: 33.6 g/dL (ref 31.5–35.7)
MCV: 95 fL (ref 79–97)
Monocytes Absolute: 0.9 10*3/uL (ref 0.1–0.9)
Monocytes: 10 %
Neutrophils Absolute: 6.9 10*3/uL (ref 1.4–7.0)
Neutrophils: 74 %
Platelets: 211 10*3/uL (ref 150–450)
RBC: 4.86 x10E6/uL (ref 4.14–5.80)
RDW: 11.1 % — ABNORMAL LOW (ref 11.6–15.4)
WBC: 9.2 10*3/uL (ref 3.4–10.8)

## 2021-01-18 LAB — IGE: IgE (Immunoglobulin E), Serum: 4 IU/mL — ABNORMAL LOW (ref 6–495)

## 2021-01-21 ENCOUNTER — Other Ambulatory Visit: Payer: Self-pay

## 2021-01-21 ENCOUNTER — Ambulatory Visit (HOSPITAL_COMMUNITY)
Admission: RE | Admit: 2021-01-21 | Discharge: 2021-01-21 | Disposition: A | Discharge status: Home / Self Care | Payer: Medicare Other | Source: Ambulatory Visit | Attending: Vascular Surgery | Admitting: Vascular Surgery

## 2021-01-21 ENCOUNTER — Ambulatory Visit: Payer: Medicare Other | Admitting: Physician Assistant

## 2021-01-21 VITALS — BP 147/80 | HR 93 | Temp 98.0°F | Resp 20 | Ht 69.0 in | Wt 179.5 lb

## 2021-01-21 DIAGNOSIS — I714 Abdominal aortic aneurysm, without rupture, unspecified: Secondary | ICD-10-CM | POA: Insufficient documentation

## 2021-01-21 DIAGNOSIS — I739 Peripheral vascular disease, unspecified: Secondary | ICD-10-CM

## 2021-01-21 NOTE — Progress Notes (Signed)
[facility] [place] HISTORY AND PHYSICAL   History of Present Illness:  Patient is a 70 y.o. year old male who presents for evaluation of EVAR repair for AAA by Dr. Darrick Penna 12/12/19.  He denise abdominal/lumbar pain.  He denise rest pain, non healing wounds and new symptoms of claudication.  His walking is limited by his COPD symptoms.    His mobility is limited by COPD.  He now requires home O2. For 2 months.  He is on Eliquis for valve replacement and Crestor for a daily Statin.    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   Hyperlipidemia    Myocardial infarction [facility])    Postoperative atrial fibrillation (HCC) 2018   White coat hypertension     Past Surgical History:  Procedure Laterality Date   ABDOMINAL AORTIC ENDOVASCULAR STENT GRAFT N/A 12/11/2017   Procedure: ABDOMINAL AORTIC ENDOVASCULAR STENT GRAFT;  Surgeon: Sherren Kerns, MD;  Location: [facility] OR;  Service: Vascular;  Laterality: N/A;   ANKLE SURGERY Right 2009   AORTIC VALVE REPLACEMENT N/A 09/05/2016   Procedure: AORTIC VALVE REPLACEMENT (AVR) USING 23 MM MAGNA EASE PERICARDIAL TISSUE VALVE;  Surgeon: Delight Ovens, MD;  Location: [facility] OR;  Service: Open Heart Surgery;  Laterality: N/A;   APPENDECTOMY     CARDIAC CATHETERIZATION N/A 02/02/2016   Procedure: Left Heart Cath and Coronary Angiography;  Surgeon: Rinaldo Cloud, MD;  [facility] INVASIVE CV LAB;  Service: Cardiovascular;  Laterality: N/A;   COLONOSCOPY  06/22/2010   [id number] rectal and right colon polyps removed remainder rectum and colon appared normal   COLONOSCOPY WITH PROPOFOL N/A  05/15/2014   Procedure: COLONOSCOPY WITH PROPOFOL;  Surgeon: Corbin Ade, MD;  Location: [facility] ORS;  Service: Endoscopy;  Laterality: N/A;  In cecum @ 0918, out @ 0937, withdrawal time 19 minutes   CORONARY ARTERY BYPASS GRAFT N/A 09/05/2016   Procedure: CORONARY ARTERY BYPASS GRAFTING (CABG) USING LIMA TO DISTAL LAD AND ENDOSCOPICALLY HARVESTED GREATER SAPHENOUS VEIN TO CIRC.;  Surgeon: Delight Ovens, MD;  Location: [facility] OR;  Service: Open Heart Surgery;  Laterality: N/A;   ENDOVEIN HARVEST OF GREATER SAPHENOUS VEIN Right 09/05/2016   Procedure: ENDOVEIN HARVEST OF GREATER SAPHENOUS VEIN;  Surgeon: Delight Ovens, MD;  Location[facility] OR;  Service: Open Heart Surgery;  Laterality: Right;   POLYPECTOMY N/A 05/15/2014   Procedure: POLYPECTOMY;  Surgeon: Corbin Ade, MD;  Location: [facility] ORS;  Service: Endoscopy;  Laterality: N/A;   RIGHT/LEFT HEART CATH AND CORONARY ANGIOGRAPHY N/A 08/29/2016   Procedure: Right/Left Heart Cath and Coronary Angiography;  Surgeon: Tonny Bollman, MD;  Location: [facility] INVASIVE CV LAB;  Service: Cardiovascular;  Laterality: N/A;   TEE WITHOUT CARDIOVERSION N/A 09/05/2016   Procedure: TRANSESOPHAGEAL ECHOCARDIOGRAM (TEE);  Surgeon: Delight Ovens, MD;  Location: [facility] OR;  Service: Open Heart Surgery;  Laterality: N/A;    ROS:   General:  No weight loss, Fever, chills  HEENT: No recent headaches, no nasal bleeding, no visual changes, no sore throat  Neurologic: No dizziness, blackouts, seizures.  No recent symptoms of stroke or mini- stroke. No recent episodes of slurred speech, or temporary blindness.  Cardiac: No recent episodes of chest pain/pressure, positive shortness of breath at rest.  Positive shortness of breath with exertion.  Denies history of atrial fibrillation or irregular heartbeat  Vascular: No history of rest pain in feet.  No history of claudication.  No history of non-healing ulcer, No history of DVT   Pulmonary: positive home oxygen, no productive  cough, no hemoptysis,  positive asthma or wheezing  Musculoskeletal:  [ ]  Arthritis, [ ]  Low back pain,  [ ]  Joint pain  Hematologic:No history of hypercoagulable state.  No history of easy bleeding.  No history of anemia  Gastrointestinal: No hematochezia or melena,  No gastroesophageal reflux, no trouble swallowing  Urinary: [ ]  chronic Kidney disease, [ ]  on HD - [ ]  MWF or [ ]  TTHS, [ ]  Burning with urination, [ ]  Frequent urination, [ ]  Difficulty urinating;   Skin: No rashes  Psychological: No history of anxiety,  No history of depression  Social History Social History   Tobacco Use   Smoking status: Former    Packs/day: 1.50    Years: 40.00    Pack years: 60.00    Types: Cigarettes    Quit date: 04/16/2002    Years since quitting: 18.7   Smokeless tobacco: Never  Vaping Use   Vaping Use: Never used  Substance Use Topics   Alcohol use: Yes    Alcohol/week: 5.0 standard drinks    Types: 5 Cans of beer per week    Comment: per pt   Drug use: No    Family History Family History  Problem Relation Age of Onset   COPD Mother    Hypertension Sister    AAA (abdominal aortic aneurysm) Brother    Colon cancer Neg Hx    Liver disease Neg Hx     Allergies  Allergies  Allergen Reactions   Ciprofloxacin     Artificial heart valve     Current Outpatient Medications  Medication Sig Dispense Refill   acetaminophen (TYLENOL) 500 MG tablet Take 500 mg by mouth every 6 (six) hours as needed for moderate pain.      albuterol (VENTOLIN HFA) 108 (90 Base) MCG/ACT inhaler INHALE 2 PUFFS BY MOUTH EVERY 4 HOURS AS NEEDED 18 each 5   Budeson-Glycopyrrol-Formoterol (BREZTRI AEROSPHERE) 160-9-4.8 MCG/ACT AERO Take 2 puffs first thing in am and then another 2 puffs about 12 hours later. 10.7 g 11   diltiazem (CARDIZEM) 60 MG tablet TAKE 1 TABLET BY MOUTH TWICE A DAY 180 tablet 1   ELIQUIS 5 MG TABS tablet TAKE 1 TABLET BY MOUTH TWICE A DAY 60 tablet 6   fexofenadine (ALLEGRA) 180  MG tablet Take 180 mg by mouth daily.     fluticasone (FLONASE) 50 MCG/ACT nasal spray SPRAY 2 SPRAYS INTO EACH NOSTRIL EVERY DAY 16 mL 5   lisinopril (ZESTRIL) 20 MG tablet      Multiple Vitamin (MULTIVITAMIN) tablet Take 1 tablet by mouth daily.     rosuvastatin (CRESTOR) 40 MG tablet 1 qd 90 tablet 1   vitamin C (ASCORBIC ACID) 500 MG tablet Take 500 mg by mouth daily.      No current facility-administered medications for this visit.    Physical Examination  Vitals:   01/21/21 0951  BP: (!) 147/80  Pulse: 93  Resp: 20  Temp: 98 F (36.7 C)  TempSrc: Temporal  SpO2: 94%  Weight:  179 lb 8 oz (81.4 kg)  Height: 5\' 9"  (1.753 m)    Body mass index is 26.51 kg/m.  General:  Alert and oriented, no acute distress HEENT: Normal Neck: No bruit or JVD Pulmonary: Clear to auscultation distant breath sounds bilaterally Cardiac: Regular Rate and Rhythm without murmur Abdomen: Soft, non-tender, non-distended, no mass, no scars Skin: No rash Extremity Pulses:  2+ radial, brachial, femoral,  right dorsalis pedis, doppler and left  posterior tibial doppler Musculoskeletal: left LE to ankle edema  Neurologic: Upper and lower extremity motor 5/5 and symmetric  DATA:      Endovascular Aortic Repair (EVAR):  +----------+----------------+-------------------+-------------------+              Diameter AP (cm) Diameter Trans (cm) Velocities (cm/sec)   +----------+----------------+-------------------+-------------------+   Aorta      4.07             3.75                58                    +----------+----------------+-------------------+-------------------+   Right Limb 1.02             1.00                80                    +----------+----------------+-------------------+-------------------+   Left Limb  1.02             0.90                141                   +----------+----------------+-------------------+-------------------+     Summary/PLAN:  Abdominal Aorta: The  largest aortic diameter has decreased compared to  prior exam. Previous diameter measurement was obtained on 01/22/2020.     ASSESSMENT:  AAA s/p EVAR repair for AAA by Dr. 01/24/2020 12/12/19. He remains asymptomatic for ischemic symptoms such as claudication, rest pain or non healing wounds.  He is not very active secondary to COPD.  His EVAR duplex shows a decrease in size of the AAA sac and no endo leak.  I will have him f/u in 6 months for repeat EVAR and ABI's for new baseline.  If he develops symptoms of ischemia he will call our office.      13/4/21 PA-C [facility] [place] Office: [phone]  MD in clinic [place]

## 2021-02-08 NOTE — Progress Notes (Signed)
Cardiology Office Note  Date: 02/09/2021   ID: [name], DOB 02/01/1951, MRN [id number]  PCP:  Kathyrn Drown, MD  Cardiologist:  Rozann Lesches, MD Electrophysiologist:  None   Chief Complaint  Patient presents with   Cardiac follow-up    History of Present Illness: [name] is a 71 y.o. male last seen in October 2021.  He is here for a routine visit.  No active angina symptoms at this time on medical therapy, NYHA class II-III dyspnea depending on level of activity.  He has established with Dr. Melvyn Novas, on oxygen supplementation with chronic hypoxic respiratory failure and COPD.  I reviewed his recent lab work and chest x-ray results.  A switch was attempted from lisinopril to Benicar at low-dose, however he had to go back to lisinopril stating that he felt worse, specifically lightheaded on Benicar, and brought in home blood pressure checks that did show systolics down into the 0000000.  His baseline systolic is in the [id number] range.  He has whitecoat hypertension as well reflected in his blood pressure today.  Lipid numbers have continued to improve on Crestor 40 mg daily with most recent LDL down to 74.  With updated lipid guidelines indicating LDL under 55, he may need addition of Zetia depending on subsequent checks.  His last echocardiogram was in 2018, we discussed obtaining an updated study for follow-up of his bioprosthetic AVR.  He does not report any palpitations.  We went over the remainder of his medications which are noted below.  He does not report any spontaneous bleeding problems on Eliquis.  Also had recent vascular follow-up with abdominal ultrasound in December 2022 reviewed.  Past Medical History:  Diagnosis Date   AAA (abdominal aortic aneurysm)    Status post EVAR November 2019   Arthritis    COPD (chronic obstructive pulmonary disease) (HCC)    Coronary artery disease    Status post LIMA to LAD and SVG to circumflex July 2018   Essential  hypertension    Fatty liver    History of aortic stenosis    Status post 23 mm Magna Ease pericardial AVR July 2018   Hyperlipidemia    Myocardial infarction [facility])    Postoperative atrial fibrillation ([place]) 2018   White coat hypertension     Past Surgical History:  Procedure Laterality Date   ABDOMINAL AORTIC ENDOVASCULAR STENT GRAFT N/A 12/11/2017   Procedure: ABDOMINAL AORTIC ENDOVASCULAR STENT GRAFT;  Surgeon: Elam Dutch, MD;  Location: [place];  Service: Vascular;  Laterality: N/A;   ANKLE SURGERY Right 2009   AORTIC VALVE REPLACEMENT N/A 09/05/2016   Procedure: AORTIC VALVE REPLACEMENT (AVR) USING 23 MM MAGNA EASE PERICARDIAL TISSUE VALVE;  Surgeon: Grace Isaac, MD;  Location: [place];  Service: Open Heart Surgery;  Laterality: N/A;   APPENDECTOMY     CARDIAC CATHETERIZATION N/A 02/02/2016   Procedure: Left Heart Cath and Coronary Angiography;  Surgeon: Charolette Forward, MD;  Location: [place] [facility];  Service: Cardiovascular;  Laterality: N/A;   COLONOSCOPY  06/22/2010   [id number] rectal and right colon polyps removed remainder rectum and colon appared normal   COLONOSCOPY WITH PROPOFOL N/A 05/15/2014   Procedure: COLONOSCOPY WITH PROPOFOL;  Surgeon: Daneil Dolin, MD;  Location: [facility];  Service: Endoscopy;  Laterality: N/A;  In cecum @ [id number], out @ 0937, withdrawal time 19 minutes   CORONARY ARTERY BYPASS GRAFT N/A 09/05/2016   Procedure: CORONARY ARTERY BYPASS GRAFTING (CABG) USING LIMA TO  DISTAL LAD AND ENDOSCOPICALLY HARVESTED GREATER SAPHENOUS VEIN TO CIRC.;  Surgeon: Grace Isaac, MD;  Location: [place];  Service: Open Heart Surgery;  Laterality: N/A;   ENDOVEIN HARVEST OF GREATER SAPHENOUS VEIN Right 09/05/2016   Procedure: ENDOVEIN HARVEST OF GREATER SAPHENOUS VEIN;  Surgeon: Grace Isaac, MD;  Location: [place];  Service: Open Heart Surgery;  Laterality: Right;   POLYPECTOMY N/A 05/15/2014   Procedure: POLYPECTOMY;  Surgeon: Daneil Dolin, MD;  Location:  [facility];  Service: Endoscopy;  Laterality: N/A;   RIGHT/LEFT HEART CATH AND CORONARY ANGIOGRAPHY N/A 08/29/2016   Procedure: Right/Left Heart Cath and Coronary Angiography;  Surgeon: Sherren Mocha, MD;  Location: [place] [facility];  Service: Cardiovascular;  Laterality: N/A;   TEE WITHOUT CARDIOVERSION N/A 09/05/2016   Procedure: TRANSESOPHAGEAL ECHOCARDIOGRAM (TEE);  Surgeon: Grace Isaac, MD;  Location: [place];  Service: Open Heart Surgery;  Laterality: N/A;    Current Outpatient Medications  Medication Sig Dispense Refill   acetaminophen (TYLENOL) 500 MG tablet Take 500 mg by mouth every 6 (six) hours as needed for moderate pain.      albuterol (VENTOLIN HFA) 108 (90 Base) MCG/ACT inhaler INHALE 2 PUFFS BY MOUTH EVERY 4 HOURS AS NEEDED 18 each 5   Budeson-Glycopyrrol-Formoterol (BREZTRI AEROSPHERE) 160-9-4.8 MCG/ACT AERO Take 2 puffs first thing in am and then another 2 puffs about 12 hours later. 10.7 g 11   diltiazem (CARDIZEM) 60 MG tablet TAKE 1 TABLET BY MOUTH TWICE A DAY 180 tablet 1   ELIQUIS 5 MG TABS tablet TAKE 1 TABLET BY MOUTH TWICE A DAY 60 tablet 6   fexofenadine (ALLEGRA) 180 MG tablet Take 180 mg by mouth daily.     fluticasone (FLONASE) 50 MCG/ACT nasal spray SPRAY 2 SPRAYS INTO EACH NOSTRIL EVERY DAY 16 mL 5   lisinopril (ZESTRIL) 20 MG tablet Take 20 mg by mouth daily.     Multiple Vitamin (MULTIVITAMIN) tablet Take 1 tablet by mouth daily.     rosuvastatin (CRESTOR) 40 MG tablet 1 qd 90 tablet 1   vitamin C (ASCORBIC ACID) 500 MG tablet Take 500 mg by mouth daily.      No current facility-administered medications for this visit.   Allergies:  Ciprofloxacin   ROS: No orthopnea or PND.  Physical Exam: VS:  BP 138/88    Pulse 84    Ht 5\' 9"  (1.753 m)    Wt 183 lb 6.4 oz (83.2 kg)    SpO2 93%    BMI 27.08 kg/m , BMI Body mass index is 27.08 kg/m.  Wt Readings from Last 3 Encounters:  02/09/21 183 lb 6.4 oz (83.2 kg)  01/21/21 179 lb 8 oz (81.4 kg)  12/29/20  181 lb 1.9 oz (82.2 kg)    General: Patient appears comfortable at rest.  Wearing supplemental oxygen via nasal cannula. HEENT: Conjunctiva and lids normal, wearing a mask. Neck: Supple, no elevated JVP or carotid bruits, no thyromegaly. Lungs: Decreased breath sounds without wheezing, nonlabored breathing at rest. Cardiac: Regular rate and rhythm, no S3, 2/6 systolic murmur. Extremities: No pitting edema.  ECG:  An ECG dated 11/18/2019 was personally reviewed today and demonstrated:  Sinus rhythm with anteroseptal Q waves.  Recent Labwork: 04/24/2020: ALT 16; AST 26 01/14/2021: BUN 9; Creatinine, Ser 0.55; Hemoglobin 15.5; Platelets 211; Potassium 4.6; Sodium 138     Component Value Date/Time   CHOL 169 11/24/2020 0851   TRIG 211 (H) 11/24/2020 0851   HDL 60 11/24/2020 0851  CHOLHDL 2.8 11/24/2020 0851   CHOLHDL 3.1 02/19/2018 0838   VLDL 45 (H) 05/16/2016 0856   LDLCALC 74 11/24/2020 0851   LDLCALC 78 02/19/2018 0838    Other Studies Reviewed Today:  Echocardiogram 09/14/2016: - Left ventricle: The cavity size was moderately dilated. Systolic    function was vigorous. The estimated ejection fraction was in the    range of 65% to 70%. Wall motion was normal; there were no    regional wall motion abnormalities. Left ventricular diastolic    function parameters were normal.  - Aortic valve: A 57mm Magna Ease Pericardial bioprosthesis is    present and functioning normally. There was no regurgitation.    There was no significant perivalvular regurgitation. Peak    velocity (S): 287 cm/s. Mean gradient (S): 20 mm Hg.  - Left atrium: The atrium was mildly dilated. Anterior-posterior    dimension: 42 mm.  - Right ventricle: The cavity size was mildly dilated. Wall    thickness was normal.  - Right atrium: The atrium was mildly dilated.  - Pulmonary arteries: Systolic pressure could not be accurately    estimated.   Impressions:   - Compared to prior echo, LVF remains  preserved. There is now a    bioprosthetic AVR in place and appears to be functioning grossly    normal. This valve is not well visualized. The mean AV gradient    is 26mmHg. There is no perivalvular AI.   Abdominal ultrasound 01/21/2021: Endovascular Aortic Repair (EVAR):  +----------+----------------+-------------------+-------------------+              Diameter AP (cm) Diameter Trans (cm) Velocities (cm/sec)   +----------+----------------+-------------------+-------------------+   Aorta      4.07             3.75                58                    +----------+----------------+-------------------+-------------------+   Right Limb 1.02             1.00                80                    +----------+----------------+-------------------+-------------------+   Left Limb  1.02             0.90                141                   +----------+----------------+-------------------+-------------------+   Summary:  Abdominal Aorta: The largest aortic diameter has decreased compared to  prior exam. Previous diameter measurement was obtained on 01/22/2020.  Assessment and Plan:  1.  History of aortic stenosis status post bioprosthetic AVR in 2018 at the time of CABG.  We will plan a follow-up echocardiogram in comparison to previous study from 2018.  2.  Paroxysmal atrial fibrillation with CHA2DS2-VASc score of 3.  He is symptomatically stable without progressive palpitations.  Continue Eliquis for stroke prophylaxis, he is also on Cardizem 60 mg twice daily (to be switched to Cardizem CD 120 mg daily if symptoms worsen).  Recent lab work reviewed.  3.  CAD status post CABG in 2018.  No angina symptoms at this time.  Continue Cardizem and Crestor.  4.  Hyperlipidemia on Crestor 40 mg daily.  Most recent LDL was down to 74.  Could consider addition of Zetia 10 mg daily depending on next check.  5.  Essential hypertension and whitecoat hypertension.  He is now back on lisinopril, did not  tolerate low-dose Benicar.  If switch to ARB is felt to be of significant importance at Pulmonary follow-up, could consider trying to reduce the dose to 10 mg daily or switching to losartan 12.5 mg daily.  6.  History of AAA status post EVAR, recent follow-up with Vascular noted.  He has been stable over time.  Medication Adjustments/Labs and Tests Ordered: Current medicines are reviewed at length with the patient today.  Concerns regarding medicines are outlined above.   Tests Ordered: Orders Placed This Encounter  Procedures   ECHOCARDIOGRAM COMPLETE    Medication Changes: No orders of the defined types were placed in this encounter.   Disposition:  Follow up  1 year.  Signed, Satira Sark, MD, [facility] 02/09/2021 10:11 AM    [facility] [place], [place], [place] [address] Phone: [phone]; Fax: [phone]

## 2021-02-09 ENCOUNTER — Ambulatory Visit: Payer: Medicare Other | Admitting: Cardiology

## 2021-02-09 ENCOUNTER — Encounter: Payer: Self-pay | Admitting: Cardiology

## 2021-02-09 VITALS — BP 138/88 | HR 84 | Ht 69.0 in | Wt 183.4 lb

## 2021-02-09 DIAGNOSIS — I480 Paroxysmal atrial fibrillation: Secondary | ICD-10-CM

## 2021-02-09 DIAGNOSIS — I25119 Atherosclerotic heart disease of native coronary artery with unspecified angina pectoris: Secondary | ICD-10-CM

## 2021-02-09 DIAGNOSIS — Z952 Presence of prosthetic heart valve: Secondary | ICD-10-CM

## 2021-02-09 DIAGNOSIS — I10 Essential (primary) hypertension: Secondary | ICD-10-CM | POA: Diagnosis not present

## 2021-02-09 NOTE — Patient Instructions (Signed)

## 2021-02-15 ENCOUNTER — Encounter: Payer: Self-pay | Admitting: Internal Medicine

## 2021-02-15 ENCOUNTER — Ambulatory Visit: Payer: Medicare Other | Admitting: Internal Medicine

## 2021-02-15 ENCOUNTER — Other Ambulatory Visit: Payer: Self-pay

## 2021-02-15 DIAGNOSIS — R0902 Hypoxemia: Secondary | ICD-10-CM | POA: Diagnosis not present

## 2021-02-15 DIAGNOSIS — J449 Chronic obstructive pulmonary disease, unspecified: Secondary | ICD-10-CM | POA: Diagnosis not present

## 2021-02-15 DIAGNOSIS — I10 Essential (primary) hypertension: Secondary | ICD-10-CM

## 2021-02-15 DIAGNOSIS — J9611 Chronic respiratory failure with hypoxia: Secondary | ICD-10-CM | POA: Diagnosis not present

## 2021-02-15 MED ORDER — STIOLTO RESPIMAT 2.5-2.5 MCG/ACT IN AERS: 2.0000 | INHALATION_SPRAY | Freq: Every day | RESPIRATORY_TRACT | 11 refills | Status: DC

## 2021-02-15 MED ORDER — IRBESARTAN 75 MG PO TABS: 75.0000 mg | ORAL_TABLET | Freq: Every day | ORAL | 11 refills | Status: DC

## 2021-02-15 MED ORDER — STIOLTO RESPIMAT 2.5-2.5 MCG/ACT IN AERS: 2.0000 | INHALATION_SPRAY | Freq: Every day | RESPIRATORY_TRACT | 0 refills | Status: DC

## 2021-02-15 NOTE — Progress Notes (Signed)
[name], male    DOB: 28-Apr-1950,    MRN: [id number]   Brief patient profile:  18 yowm quit smoking 2004 s much resp cc  referred to pulmonary clinic in [place]  12/29/2020 by Dr  [name] for [place] 3 dx 2018 / newly 02 rx x around 10/2020  Pt is s/p AVR 08/2016     History of Present Illness  12/29/2020  Pulmonary/ 1st office eval/ [name] / [name] / [name]  Chief Complaint  Patient presents with   Consult    COPD for 15 years. Diagnosed by Dr. Sallee Lange.   2LO2 pulse portable oxygen. 2LO2 cont. At home and while sleeping. Has been on O2 for 2 months.   Dyspnea:  2lpm and 30 yards to street slt incline to house and back but does not check 02  Cough: none Sleep: 2lpm hs /flat  SABA use: rarely = once a week hfa Rec Stop trelegy and lisinopril  Benicar (olemasartan) 20 mg one daily  Plan A = Automatic = Always=  breztri Take 2 puffs first thing in am and then another 2 puffs about 12 hours later.  Work on inhaler technique:   Plan B = Backup (to supplement plan A, not to replace it) Only use your albuterol inhaler as a rescue medication Make sure you check your oxygen saturation  AT  your highest level of activity (not after you stop)  office visit in 6 weeks, call sooner if needed with all medications /inhalers/ solutions in hand  02/15/2021  f/u ov/[place] office/[facility]ker[facility] [place] re: COPD GOLD 3/ ? acei case  maint on breztri  Chief Complaint  Patient presents with   Follow-up    Feels breathing has improved "a little bit but not a lot"  2LO2 cont. And pulse while out.   88% 2LO2 pulse form lobby to room.  92& 2LO2 pulse after sitting for approx. 5 min.   Dyspnea:  mb and back is without 02 /  same for grocery store too  Cough: worse assoc very hoarse x 3 days p back on ACEi x 3 weeks  Sleeping: flat bed on 2lpm  SABA use: maybe once  02: 2lpm HS and daytime  Covid status: vax x 5  Lung cancer screening: > 15 y since  Quit    No obvious day to day  or daytime variability or assoc excess/ purulent sputum or mucus plugs or hemoptysis or cp or chest tightness, subjective wheeze or overt sinus or hb symptoms.   sleeping without nocturnal  or early am exacerbation  of respiratory  c/o's or need for noct saba. Also denies any obvious fluctuation of symptoms with weather or environmental changes or other aggravating or alleviating factors except as outlined above   No unusual exposure hx or h/o childhood pna/ asthma or knowledge of premature birth.  Current Allergies, Complete Past Medical History, Past Surgical History, Family History, and Social History were reviewed in [employer] record.  ROS  The following are not active complaints unless bolded Hoarseness, sore throat, dysphagia, dental problems, itching, sneezing,  nasal congestion or discharge of excess mucus or purulent secretions, ear ache,   fever, chills, sweats, unintended wt loss or wt gain, classically pleuritic or exertional cp,  orthopnea pnd or arm/hand swelling  or leg swelling, presyncope, palpitations, abdominal pain, anorexia, nausea, vomiting, diarrhea  or change in bowel habits or change in bladder habits, change in stools or change in urine, dysuria, hematuria,  rash,  arthralgias, visual complaints, headache, numbness, weakness or ataxia or problems with walking or coordination,  change in mood or  memory.        Current Meds  Medication Sig   acetaminophen (TYLENOL) 500 MG tablet Take 500 mg by mouth every 6 (six) hours as needed for moderate pain.    albuterol (VENTOLIN HFA) 108 (90 Base) MCG/ACT inhaler INHALE 2 PUFFS BY MOUTH EVERY 4 HOURS AS NEEDED   Budeson-Glycopyrrol-Formoterol (BREZTRI AEROSPHERE) 160-9-4.8 MCG/ACT AERO Take 2 puffs first thing in am and then another 2 puffs about 12 hours later.   diltiazem (CARDIZEM) 60 MG tablet TAKE 1 TABLET BY MOUTH TWICE A DAY   ELIQUIS 5 MG TABS tablet TAKE 1 TABLET BY MOUTH TWICE A DAY   fexofenadine  (ALLEGRA) 180 MG tablet Take 180 mg by mouth daily.   fluticasone (FLONASE) 50 MCG/ACT nasal spray SPRAY 2 SPRAYS INTO EACH NOSTRIL EVERY DAY   lisinopril (ZESTRIL) 20 MG tablet Take 20 mg by mouth daily.   Multiple Vitamin (MULTIVITAMIN) tablet Take 1 tablet by mouth daily.   rosuvastatin (CRESTOR) 40 MG tablet 1 qd   vitamin C (ASCORBIC ACID) 500 MG tablet Take 500 mg by mouth daily.               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   Hyperlipidemia    Myocardial infarction [facility])    Postoperative atrial fibrillation ([place]) 2018   White coat hypertension        Objective:     Wt Readings from Last 3 Encounters:  02/15/21 181 lb 1.9 oz (82.2 kg)  02/09/21 183 lb 6.4 oz (83.2 kg)  01/21/21 179 lb 8 oz (81.4 kg)      Vital signs reviewed  02/15/2021  - Note at rest 02 sats  92% on 2lpm pulsed   General appearance:    hoarse amb wm    HEENT : pt wearing mask not removed for exam due to covid - 19 concerns.    NECK :  without JVD/Nodes/TM/ nl carotid upstrokes bilaterally   LUNGS: no acc muscle use,  Mild barrel  contour chest wall with bilateral  Distant bs s audible wheeze and  without cough on insp or exp maneuvers  and mild  Hyperresonant  to  percussion bilaterally     CV:  RRR  no s3 or murmur or increase in P2, and no edema   ABD:  soft and nontender with pos end  insp Hoover's  in the supine position. No bruits or organomegaly appreciated, bowel sounds nl  MS:   Nl gait/  ext warm without deformities, calf tenderness, cyanosis or clubbing No obvious joint restrictions   SKIN: warm and dry without lesions     NEURO:  alert, approp, nl sensorium with  no motor or cerebellar deficits apparent.         I personally reviewed images and agree with radiology impression as follows:  CXR:   01/14/21 pa and lateral COPD.  No active cardiopulmonary disease.  Assessment

## 2021-02-15 NOTE — Assessment & Plan Note (Addendum)
Placed on 02 around 10/2020 - 12/29/2020   Walked on 2lpm pulsed x  3  lap(s) =  approx 450 @ moderate then slower pace, stopped due to end of study with onset of doe during 2nd with lowest 02 sats 90%   Make sure you check your oxygen saturation  AT  your highest level of activity (not after you stop)   to be sure it stays over 90% and adjust  02 flow upward to maintain this level if needed but remember to turn it back to previous settings when you stop (to conserve your supply).          Each maintenance medication was reviewed in detail including emphasizing most importantly the difference between maintenance and prns and under what circumstances the prns are to be triggered using an action plan format where appropriate.  Total time for H and P, chart review, counseling, reviewing smi/hfa/02 device(s) and generating customized AVS unique to this office visit / same day charting  > 30 min

## 2021-02-15 NOTE — Patient Instructions (Addendum)
Plan A = Automatic = Always=    Stiolto 2 puffs each am 1st thing   Plan B = Backup (to supplement plan A, not to replace it) Only use your albuterol inhaler as a rescue medication to be used if you can't catch your breath by resting or doing a relaxed purse lip breathing pattern.  - The less you use it, the better it will work when you need it. - Ok to use the inhaler up to 2 puffs  every 4 hours if you must but call for appointment if use goes up over your usual need - Don't leave home without it !!  (think of it like the spare tire for your car)     Stop lisinopril and take ibesartan 75 mg one daily  (ok to break in half if too strong)   Make sure you check your oxygen saturation  AT  your highest level of activity (not after you stop)   to be sure it stays over 90% and adjust  02 flow upward to maintain this level if needed but remember to turn it back to previous settings when you stop (to conserve your supply).    Please schedule a follow up office visit in 6 weeks, call sooner if needed

## 2021-02-15 NOTE — Assessment & Plan Note (Signed)
Trial off acei and on ARB 12/29/2020 for atypical copd with mild pseudowheeze - 02/15/2021 bp too low on benicar 20 so rec avapro 75 and adjust from one-half to one up to bid    recheck in  6 weeks, call in meantime if problem

## 2021-02-15 NOTE — Addendum Note (Signed)
Addended by: Carleene Mains D on: 02/15/2021 11:00 AM   Modules accepted: Orders

## 2021-02-15 NOTE — Assessment & Plan Note (Addendum)
Quit smoking x 2005 with Onset of symptoms  Around 2018  - PFT's  12/29/2020  FEV1 1.09 (32 % ) ratio 0.35  p 1 % improvement from saba p ? prior to study with DLCO  12.41 (40%) corrects to 2.51 (55%)  for alv volume and FV curve classicall concave  - 12/29/2020   breztri trial and off acei. Labs ordered 12/29/2020  :  allergy profile Eos 0.1/ IgE 4    alpha one AT phenotype   MM   - 02/15/2021  After extensive coaching inhaler device,  effectiveness =    90% with SMI > stiolto trial and again off acei for UACS/ hoarseness

## 2021-03-09 ENCOUNTER — Ambulatory Visit (INDEPENDENT_AMBULATORY_CARE_PROVIDER_SITE_OTHER): Payer: Medicare Other

## 2021-03-09 DIAGNOSIS — Z952 Presence of prosthetic heart valve: Secondary | ICD-10-CM | POA: Diagnosis not present

## 2021-03-09 DIAGNOSIS — R0902 Hypoxemia: Secondary | ICD-10-CM | POA: Diagnosis not present

## 2021-03-09 DIAGNOSIS — J449 Chronic obstructive pulmonary disease, unspecified: Secondary | ICD-10-CM | POA: Diagnosis not present

## 2021-03-09 LAB — ECHOCARDIOGRAM COMPLETE
AR max vel: 1.26 cm2
AV Area VTI: 1.25 cm2
AV Area mean vel: 1.22 cm2
AV Mean grad: 22.8 mmHg
AV Peak grad: 38.1 mmHg
Ao pk vel: 3.09 m/s
Area-P 1/2: 3.5 cm2
Calc EF: 64.6 %
S' Lateral: 2.8 cm
Single Plane A2C EF: 59.4 %
Single Plane A4C EF: 69.8 %

## 2021-03-12 ENCOUNTER — Encounter: Payer: Self-pay | Admitting: *Deleted

## 2021-03-12 ENCOUNTER — Other Ambulatory Visit: Payer: Self-pay | Admitting: Cardiology

## 2021-03-29 ENCOUNTER — Encounter: Payer: Self-pay | Admitting: Internal Medicine

## 2021-03-29 ENCOUNTER — Ambulatory Visit: Payer: Medicare Other | Admitting: Internal Medicine

## 2021-03-29 ENCOUNTER — Other Ambulatory Visit: Payer: Self-pay

## 2021-03-29 DIAGNOSIS — J449 Chronic obstructive pulmonary disease, unspecified: Secondary | ICD-10-CM

## 2021-03-29 DIAGNOSIS — R0902 Hypoxemia: Secondary | ICD-10-CM

## 2021-03-29 DIAGNOSIS — J9611 Chronic respiratory failure with hypoxia: Secondary | ICD-10-CM

## 2021-03-29 DIAGNOSIS — I10 Essential (primary) hypertension: Secondary | ICD-10-CM | POA: Diagnosis not present

## 2021-03-29 NOTE — Progress Notes (Signed)
[name], male    DOB: 1950/02/16,    MRN: [id number]   Brief patient profile:  37 yowm quit smoking 2004 s much resp cc  referred to pulmonary clinic in [place]  12/29/2020 by Dr  [name] for [place] 3 dx 2018 / newly 02 rx x around 10/2020  Pt is s/p AVR 08/2016     History of Present Illness  12/29/2020  Pulmonary/ 1st office eval/ Bintou Lafata / [name] / [name]  Chief Complaint  Patient presents with   Consult    COPD for 15 years. Diagnosed by Dr. Sallee Lange.   2LO2 pulse portable oxygen. 2LO2 cont. At home and while sleeping. Has been on O2 for 2 months.   Dyspnea:  2lpm and 30 yards to street slt incline to house and back but does not check 02  Cough: none Sleep: 2lpm hs /flat  SABA use: rarely = once a week hfa Rec Stop trelegy and lisinopril  Benicar (olemasartan) 20 mg one daily  Plan A = Automatic = Always=  breztri Take 2 puffs first thing in am and then another 2 puffs about 12 hours later.  Work on inhaler technique:   Plan B = Backup (to supplement plan A, not to replace it) Only use your albuterol inhaler as a rescue medication Make sure you check your oxygen saturation  AT  your highest level of activity (not after you stop)  office visit in 6 weeks, call sooner if needed with all medications /inhalers/ solutions in hand  02/15/2021  f/u ov/[place] office/Elfrieda Espino re: COPD GOLD 3/ ? acei case  maint on breztri  Chief Complaint  Patient presents with   Follow-up    Feels breathing has improved "a little bit but not a lot"  2LO2 cont. And pulse while out.   88% 2LO2 pulse form lobby to room.  92& 2LO2 pulse after sitting for approx. 5 min.   Dyspnea:  mb and back is without 02 /  same for grocery store too  Cough: worse assoc very hoarse x 3 days p back on ACEi x 3 weeks  Sleeping: flat bed on 2lpm  SABA use: maybe once  02: 2lpm HS and daytime  Covid status: vax x 5  Lung cancer screening: > 15 y since  Quit   Rec Plan A = Automatic =  Always=    Stiolto 2 puffs each am 1st thing  Plan B = Backup (to supplement plan A, not to replace it) Only use your albuterol inhaler as a rescue medication Stop lisinopril and take ibesartan 75 mg one daily  (ok to break in half if too strong)  Make sure you check your oxygen saturation  AT  your highest level of activity (not after you stop)   to be sure it stays over 90%  Please schedule a follow up office visit in 6 weeks, call sooner if needed     03/29/2021  f/u ov/[place] office/Anjelica Gorniak re: GOLD 3/ 0 2 hs and prn   maint on sttiolto 2 each am   Chief Complaint  Patient presents with   Follow-up    Feels breathing is continuing to improve.  From lobby to room no SOB O2 sats 96% 2LO2 pulse.    Dyspnea:  mb and back better and not consistent with 02 with ex  or checking as rec  Cough: none  Sleeping: on side / bed is flat/ 2 pillows  SABA use: none 02: 2lpm hs and prn  day time  Covid status: all per pt  Lung cancer screening:  out > 15 y from last reported cig smoking    No obvious day to day or daytime variability or assoc excess/ purulent sputum or mucus plugs or hemoptysis or cp or chest tightness, subjective wheeze or overt sinus or hb symptoms.   Sleeping  without nocturnal  or early am exacerbation  of respiratory  c/o's or need for noct saba. Also denies any obvious fluctuation of symptoms with weather or environmental changes or other aggravating or alleviating factors except as outlined above   No unusual exposure hx or h/o childhood pna/ asthma or knowledge of premature birth.  Current Allergies, Complete Past Medical History, Past Surgical History, Family History, and Social History were reviewed in [employer] record.  ROS  The following are not active complaints unless bolded Hoarseness, sore throat, dysphagia, dental problems, itching, sneezing,  nasal congestion or discharge of excess mucus or purulent secretions, ear ache,   fever, chills,  sweats, unintended wt loss or wt gain, classically pleuritic or exertional cp,  orthopnea pnd or arm/hand swelling  or leg swelling, presyncope, palpitations, abdominal pain, anorexia, nausea, vomiting, diarrhea  or change in bowel habits or change in bladder habits, change in stools or change in urine, dysuria, hematuria,  rash, arthralgias, visual complaints, headache, numbness, weakness or ataxia or problems with walking or coordination,  change in mood or  memory.        Current Meds  Medication Sig   acetaminophen (TYLENOL) 500 MG tablet Take 500 mg by mouth every 6 (six) hours as needed for moderate pain.    albuterol (VENTOLIN HFA) 108 (90 Base) MCG/ACT inhaler INHALE 2 PUFFS BY MOUTH EVERY 4 HOURS AS NEEDED   diltiazem (CARDIZEM) 60 MG tablet TAKE 1 TABLET BY MOUTH TWICE A DAY   ELIQUIS 5 MG TABS tablet TAKE 1 TABLET BY MOUTH TWICE A DAY   fexofenadine (ALLEGRA) 180 MG tablet Take 180 mg by mouth daily.   fluticasone (FLONASE) 50 MCG/ACT nasal spray SPRAY 2 SPRAYS INTO EACH NOSTRIL EVERY DAY   irbesartan (AVAPRO) 75 MG tablet Take 1 tablet (75 mg total) by mouth daily.   Multiple Vitamin (MULTIVITAMIN) tablet Take 1 tablet by mouth daily.   rosuvastatin (CRESTOR) 40 MG tablet 1 qd   Tiotropium Bromide-Olodaterol (STIOLTO RESPIMAT) 2.5-2.5 MCG/ACT AERS Inhale 2 puffs into the lungs daily.   vitamin C (ASCORBIC ACID) 500 MG tablet Take 500 mg by mouth daily.               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    NECK :  without JVD/Nodes/TM/ nl carotid upstrokes bilaterally   LUNGS: no acc muscle use,  Mild barrel  contour chest wall with bilateral  Distant bs s audible wheeze and  without cough on insp or exp maneuvers  and mild  Hyperresonant  to  percussion bilaterally     CV:  RRR  no s3 or murmur or increase in P2, and no edema   ABD:  soft and nontender with pos end  insp Hoover's  in the supine position. No bruits or organomegaly appreciated, bowel sounds nl  MS:   Nl gait/  ext warm without deformities, calf tenderness, cyanosis or clubbing No obvious joint restrictions   SKIN: warm and dry without lesions    NEURO:  alert, approp, nl sensorium with  no motor or cerebellar deficits apparent.                   Assessment

## 2021-03-29 NOTE — Assessment & Plan Note (Addendum)
Placed on 02 around 10/2020 - 12/29/2020   Walked on 2lpm pulsed x  3  lap(s) =  approx 450 @ moderate then slower pace, stopped due to end of study with onset of doe during 2nd with lowest 02 sats 90%  - 03/29/2021   Walked on RA  x  1  lap(s) =  approx 150  ft  @ mod pace, stopped due to  lowest 02 sats 88%  Then   Place on 2LO2 pulse and sats increased to 93%. Walked last two laps at a moderate pace on 2LO2 pulse lowest sat 90%   Advised: 2lpm hs and titrate with activity to maintain > 90%   Each maintenance medication was reviewed in detail including emphasizing most importantly the difference between maintenance and prns and under what circumstances the prns are to be triggered using an action plan format where appropriate.  Total time for H and P, chart review, counseling, reviewing hfa/dpi device(s) , directly observing portions of ambulatory 02 saturation study/ and generating customized AVS unique to this office visit / same day charting = 33 min

## 2021-03-29 NOTE — Patient Instructions (Addendum)
Please remember to go to the lab department   for your tests - we will call you with the results when they are available.  Ok to try trelegy one click each am x one month, then breztri Take 2 puffs first thing in am and then another 2 puffs about 12 hours later x one month to compare to stiolto and call us with your preference  Make sure you check your oxygen saturation  AT  your highest level of activity (not after you stop)   to be sure it stays over 90% and adjust  02 flow upward to maintain this level if needed but remember to turn it back to previous settings when you stop (to conserve your supply).   Please schedule a follow up visit in 6  months but call sooner if needed

## 2021-03-29 NOTE — Assessment & Plan Note (Signed)
Trial off acei and on ARB 12/29/2020 for atypical copd with mild pseudowheeze - 02/15/2021 bp too low on benicar 20 so rec avapro 75 and adjust from one-half to one up to bid > 03/29/2021 on avapro 75 mg one daily good control / less cough / pseudowheeze   Although even in retrospect it may not be clear the ACEi contributed to the pt's symptoms,  he improved off them and adding them back at this point or in the future would risk confusion in interpretation of non-specific respiratory symptoms to which this patient is prone  ie  Better not to muddy the waters here> continue with avarpo 75 mg one daily and f/u by PCP going forward

## 2021-03-29 NOTE — Assessment & Plan Note (Signed)
Quit smoking x 2005 with Onset of symptoms  Around 2018  - PFT's  12/29/2020  FEV1 1.09 (32 % ) ratio 0.35  p 1 % improvement from saba p ? prior to study with DLCO  12.41 (40%) corrects to 2.51 (55%)  for alv volume and FV curve classicall concave  - 12/29/2020   breztri trial and off acei. Labs ordered 12/29/2020  :  allergy profile Eos 0.1/ IgE 4    alpha one AT phenotype   MM - 02/15/2021  After extensive coaching inhaler device,  effectiveness =    90% with SMI > stiolto trial and again off acei for UACS/ hoarseness > much better 03/29/2021   Pt is Group B in terms of symptom/risk and laba/lama therefore appropriate rx at this point >>>  stiolto good choice but he has full month of breztri and trelegy and ok with me if he tries them both as long as takes  full month course

## 2021-03-30 LAB — BASIC METABOLIC PANEL
BUN/Creatinine Ratio: 23 (ref 10–24)
BUN: 15 mg/dL (ref 8–27)
CO2: 22 mmol/L (ref 20–29)
Calcium: 9.2 mg/dL (ref 8.6–10.2)
Chloride: 95 mmol/L — ABNORMAL LOW (ref 96–106)
Creatinine, Ser: 0.65 mg/dL — ABNORMAL LOW (ref 0.76–1.27)
Glucose: 88 mg/dL (ref 70–99)
Potassium: 4.7 mmol/L (ref 3.5–5.2)
Sodium: 134 mmol/L (ref 134–144)
eGFR: 102 mL/min/{1.73_m2} (ref >59)

## 2021-04-06 DIAGNOSIS — J449 Chronic obstructive pulmonary disease, unspecified: Secondary | ICD-10-CM | POA: Diagnosis not present

## 2021-04-06 DIAGNOSIS — R0902 Hypoxemia: Secondary | ICD-10-CM | POA: Diagnosis not present

## 2021-05-06 ENCOUNTER — Telehealth: Payer: Self-pay | Admitting: Family Medicine

## 2021-05-06 NOTE — Telephone Encounter (Signed)
Left message for patient to call back and schedule Medicare Annual Wellness Visit (AWV) in office.  ? ?If unable to come into the office for AWV,  please offer to do virtually or by telephone. ? ?Last AWV: 05/04/2020 ? ?Please schedule at anytime with [facility]. ? ?30 minute appointment for Virtual or phone ? ?45 minute appointment for in office or Initial virtual/phone ? ?Any questions, please contact me at [phone]  ?  ?  ?

## 2021-05-07 DIAGNOSIS — R0902 Hypoxemia: Secondary | ICD-10-CM | POA: Diagnosis not present

## 2021-05-07 DIAGNOSIS — J449 Chronic obstructive pulmonary disease, unspecified: Secondary | ICD-10-CM | POA: Diagnosis not present

## 2021-05-11 ENCOUNTER — Ambulatory Visit (INDEPENDENT_AMBULATORY_CARE_PROVIDER_SITE_OTHER): Payer: Medicare Other

## 2021-05-11 ENCOUNTER — Telehealth: Payer: Self-pay

## 2021-05-11 VITALS — Ht 69.0 in | Wt 180.0 lb

## 2021-05-11 DIAGNOSIS — Z0000 Encounter for general adult medical examination without abnormal findings: Secondary | ICD-10-CM

## 2021-05-11 NOTE — Patient Instructions (Signed)
Mr. Krol , ?Thank you for taking time to come for your Medicare Wellness Visit. I appreciate your ongoing commitment to your health goals. Please review the following plan we discussed and let me know if I can assist you in the future.  ? ?Screening recommendations/referrals: ?Colonoscopy: Done 05/15/2014 Repeat in 10 years ? ?Recommended yearly ophthalmology/optometry visit for glaucoma screening and checkup ?Recommended yearly dental visit for hygiene and checkup ? ?Vaccinations: ?Influenza vaccine: Done 10/27/2020 Repeat annually ? ?Pneumococcal vaccine: Done 12/03/2015 and 06/12/2019. ?Tdap vaccine: Done 09/08/2010 Repeat in 10 years ? ?Shingles vaccine: Done 05/13/2019, 12/11/2018 and 11/20/2012   ?Covid-19: Done 11/10/2020, 04/12/2019, 24/2021 ? ?Advanced directives: Please bring a copy of your health care power of attorney and living will to the office to be added to your chart at your convenience. ? ? ?Conditions/risks identified: Aim for 30 minutes of exercise or brisk walking, 6-8 glasses of water, and 5 servings of fruits and vegetables each day. ? ? ?Next appointment: Follow up in one year for your annual wellness visit. 2024. ? ?Preventive Care 69 Years and Older, Male ? ?Preventive care refers to lifestyle choices and visits with your health care provider that can promote health and wellness. ?What does preventive care include? ?A yearly physical exam. This is also called an annual well check. ?Dental exams once or twice a year. ?Routine eye exams. Ask your health care provider how often you should have your eyes checked. ?Personal lifestyle choices, including: ?Daily care of your teeth and gums. ?Regular physical activity. ?Eating a healthy diet. ?Avoiding tobacco and drug use. ?Limiting alcohol use. ?Practicing safe sex. ?Taking low doses of aspirin every day. ?Taking vitamin and mineral supplements as recommended by your health care provider. ?What happens during an annual well check? ?The services and  screenings done by your health care provider during your annual well check will depend on your age, overall health, lifestyle risk factors, and family history of disease. ?Counseling  ?Your health care provider may ask you questions about your: ?Alcohol use. ?Tobacco use. ?Drug use. ?Emotional well-being. ?Home and relationship well-being. ?Sexual activity. ?Eating habits. ?History of falls. ?Memory and ability to understand (cognition). ?Work and work Astronomer. ?Screening  ?You may have the following tests or measurements: ?Height, weight, and BMI. ?Blood pressure. ?Lipid and cholesterol levels. These may be checked every 5 years, or more frequently if you are over 74 years old. ?Skin check. ?Lung cancer screening. You may have this screening every year starting at age 18 if you have a 30-pack-year history of smoking and currently smoke or have quit within the past 15 years. ?Fecal occult blood test (FOBT) of the stool. You may have this test every year starting at age 58. ?Flexible sigmoidoscopy or colonoscopy. You may have a sigmoidoscopy every 5 years or a colonoscopy every 10 years starting at age 76. ?Prostate cancer screening. Recommendations will vary depending on your family history and other risks. ?Hepatitis C blood test. ?Hepatitis B blood test. ?Sexually transmitted disease (STD) testing. ?Diabetes screening. This is done by checking your blood sugar (glucose) after you have not eaten for a while (fasting). You may have this done every 1-3 years. ?Abdominal aortic aneurysm (AAA) screening. You may need this if you are a current or former smoker. ?Osteoporosis. You may be screened starting at age 31 if you are at high risk. ?Talk with your health care provider about your test results, treatment options, and if necessary, the need for more tests. ?Vaccines  ?Your health care  provider may recommend certain vaccines, such as: ?Influenza vaccine. This is recommended every year. ?Tetanus, diphtheria, and  acellular pertussis (Tdap, Td) vaccine. You may need a Td booster every 10 years. ?Zoster vaccine. You may need this after age 60. ?Pneumococcal 13-valent conjugate (PCV13) vaccine. One dose is recommended after age 33. ?Pneumococcal polysaccharide (PPSV23) vaccine. One dose is recommended after age 32. ?Talk to your health care provider about which screenings and vaccines you need and how often you need them. ?This information is not intended to replace advice given to you by your health care provider. Make sure you discuss any questions you have with your health care provider. ?Document Released: 02/20/2015 Document Revised: 10/14/2015 Document Reviewed: 11/25/2014 ?Elsevier Interactive Patient Education ? 2017 [place]. ? ?Fall Prevention in the Home ?Falls can cause injuries. They can happen to people of all ages. There are many things you can do to make your home safe and to help prevent falls. ?What can I do on the outside of my home? ?Regularly fix the edges of walkways and driveways and fix any cracks. ?Remove anything that might make you trip as you walk through a door, such as a raised step or threshold. ?Trim any bushes or trees on the path to your home. ?Use bright outdoor lighting. ?Clear any walking paths of anything that might make someone trip, such as rocks or tools. ?Regularly check to see if handrails are loose or broken. Make sure that both sides of any steps have handrails. ?Any raised decks and porches should have guardrails on the edges. ?Have any leaves, snow, or ice cleared regularly. ?Use sand or salt on walking paths during winter. ?Clean up any spills in your garage right away. This includes oil or grease spills. ?What can I do in the bathroom? ?Use night lights. ?Install grab bars by the toilet and in the tub and shower. Do not use towel bars as grab bars. ?Use non-skid mats or decals in the tub or shower. ?If you need to sit down in the shower, use a plastic, non-slip stool. ?Keep  the floor dry. Clean up any water that spills on the floor as soon as it happens. ?Remove soap buildup in the tub or shower regularly. ?Attach bath mats securely with double-sided non-slip rug tape. ?Do not have throw rugs and other things on the floor that can make you trip. ?What can I do in the bedroom? ?Use night lights. ?Make sure that you have a light by your bed that is easy to reach. ?Do not use any sheets or blankets that are too big for your bed. They should not hang down onto the floor. ?Have a firm chair that has side arms. You can use this for support while you get dressed. ?Do not have throw rugs and other things on the floor that can make you trip. ?What can I do in the kitchen? ?Clean up any spills right away. ?Avoid walking on wet floors. ?Keep items that you use a lot in easy-to-reach places. ?If you need to reach something above you, use a strong step stool that has a grab bar. ?Keep electrical cords out of the way. ?Do not use floor polish or wax that makes floors slippery. If you must use wax, use non-skid floor wax. ?Do not have throw rugs and other things on the floor that can make you trip. ?What can I do with my stairs? ?Do not leave any items on the stairs. ?Make sure that there are handrails on both  sides of the stairs and use them. Fix handrails that are broken or loose. Make sure that handrails are as long as the stairways. ?Check any carpeting to make sure that it is firmly attached to the stairs. Fix any carpet that is loose or worn. ?Avoid having throw rugs at the top or bottom of the stairs. If you do have throw rugs, attach them to the floor with carpet tape. ?Make sure that you have a light switch at the top of the stairs and the bottom of the stairs. If you do not have them, ask someone to add them for you. ?What else can I do to help prevent falls? ?Wear shoes that: ?Do not have high heels. ?Have rubber bottoms. ?Are comfortable and fit you well. ?Are closed at the toe. Do not  wear sandals. ?If you use a stepladder: ?Make sure that it is fully opened. Do not climb a closed stepladder. ?Make sure that both sides of the stepladder are locked into place. ?Ask someone to hold it for yo

## 2021-05-11 NOTE — Telephone Encounter (Signed)
Pt has f/u appt with you in May and would like to have blood work done the week before. Can the order be sent to [facility]? Thank you! ?

## 2021-05-11 NOTE — Progress Notes (Signed)
? ?Subjective:  ? [name] is a 71 y.o. male who presents for Medicare Annual/Subsequent preventive examination. ?Virtual Visit via Telephone Note ? ?I connected with  Robin Searing on 05/11/21 at  1:45 PM EDT by telephone and verified that I am speaking with the correct person using two identifiers. ? ?Location: ?Patient: HOME ?Provider: [facility] ?Persons participating in the virtual visit: patient/Nurse Health Advisor ?  ?I discussed the limitations, risks, security and privacy concerns of performing an evaluation and management service by telephone and the availability of in person appointments. The patient expressed understanding and agreed to proceed. ? ?Interactive audio and video telecommunications were attempted between this nurse and patient, however failed, due to patient having technical difficulties OR patient did not have access to video capability.  We continued and completed visit with audio only. ? ?Some vital signs may be absent or patient reported.  ? ?Chriss Driver, LPN ? ?Review of Systems    ? ?Cardiac Risk Factors include: advanced age (>70men, >68 women);hypertension;dyslipidemia;male gender;sedentary lifestyle ? ?   ?Objective:  ?  ?Today's Vitals  ? 05/11/21 1344  ?Weight: 180 lb (81.6 kg)  ?Height: 5\' 9"  (1.753 m)  ? ?Body mass index is 26.58 kg/m?. ? ? ?  05/11/2021  ?  1:56 PM 01/17/2019  ?  9:11 AM 12/11/2017  ? 10:00 PM 12/06/2017  ?  9:15 AM 09/05/2016  ? 11:00 PM 09/01/2016  ? 12:05 PM 08/29/2016  ?  8:29 AM  ?Advanced Directives  ?Does Patient Have a Medical Advance Directive? Yes Yes No No No No No  ?Type of Paramedic of [place];Living will [place];Living will       ?Does patient want to make changes to medical advance directive?  No - Patient declined       ?Copy of [place] in Chart? No - copy requested        ?Would patient like information on creating a medical advance directive?   No - Patient  declined No - Patient declined No - Patient declined No - Patient declined No - Patient declined  ? ? ?Current Medications (verified) ?Outpatient Encounter Medications as of 05/11/2021  ?Medication Sig  ? acetaminophen (TYLENOL) 500 MG tablet Take 500 mg by mouth every 6 (six) hours as needed for moderate pain.   ? albuterol (VENTOLIN HFA) 108 (90 Base) MCG/ACT inhaler INHALE 2 PUFFS BY MOUTH EVERY 4 HOURS AS NEEDED  ? diltiazem (CARDIZEM) 60 MG tablet TAKE 1 TABLET BY MOUTH TWICE A DAY  ? ELIQUIS 5 MG TABS tablet TAKE 1 TABLET BY MOUTH TWICE A DAY  ? fexofenadine (ALLEGRA) 180 MG tablet Take 180 mg by mouth daily.  ? fluticasone (FLONASE) 50 MCG/ACT nasal spray SPRAY 2 SPRAYS INTO EACH NOSTRIL EVERY DAY  ? Multiple Vitamin (MULTIVITAMIN) tablet Take 1 tablet by mouth daily.  ? rosuvastatin (CRESTOR) 40 MG tablet 1 qd  ? Tiotropium Bromide-Olodaterol (STIOLTO RESPIMAT) 2.5-2.5 MCG/ACT AERS Inhale 2 puffs into the lungs daily.  ? vitamin C (ASCORBIC ACID) 500 MG tablet Take 500 mg by mouth daily.   ? irbesartan (AVAPRO) 75 MG tablet Take 1 tablet (75 mg total) by mouth daily. (Patient not taking: Reported on 05/11/2021)  ? ?No facility-administered encounter medications on file as of 05/11/2021.  ? ? ?Allergies (verified) ?Ciprofloxacin  ? ?History: ?Past Medical History:  ?Diagnosis Date  ? AAA (abdominal aortic aneurysm) ([place])   ? Status post EVAR November 2019  ?  Arthritis   ? COPD (chronic obstructive pulmonary disease) ([place])   ? Coronary artery disease   ? Status post LIMA to LAD and SVG to circumflex July 2018  ? Essential hypertension   ? Fatty liver   ? History of aortic stenosis   ? 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 Hyperlipidemia   ? Myocardial infarction [facility])   ? Postoperative atrial fibrillation ([place]) 2018  ? White coat hypertension   ? ?Past Surgical History:  ?Procedure Laterality Date  ? ABDOMINAL AORTIC ENDOVASCULAR STENT GRAFT N/A 12/11/2017  ? Procedure: ABDOMINAL AORTIC  ENDOVASCULAR STENT GRAFT;  Surgeon: Elam Dutch, MD;  Location: [place];  Service: Vascular;  Laterality: N/A;  ? ANKLE SURGERY Right 2009  ? AORTIC VALVE REPLACEMENT N/A 09/05/2016  ? Procedure: AORTIC VALVE REPLACEMENT (AVR) USING 23 MM MAGNA EASE PERICARDIAL TISSUE VALVE;  Surgeon: Grace Isaac, MD;  Location: [place];  Service: Open Heart Surgery;  Laterality: N/A;  ? APPENDECTOMY    ? CARDIAC CATHETERIZATION N/A 02/02/2016  ? Procedure: Left Heart Cath and Coronary Angiography;  Surgeon: Charolette Forward, MD;  Location: [place] [facility];  Service: Cardiovascular;  Laterality: N/A;  ? COLONOSCOPY  06/22/2010  ? [id number] rectal and right colon polyps removed remainder rectum and colon appared normal  ? COLONOSCOPY WITH PROPOFOL N/A 05/15/2014  ? Procedure: COLONOSCOPY WITH PROPOFOL;  Surgeon: Daneil Dolin, MD;  Location: [facility];  Service: Endoscopy;  Laterality: N/A;  In cecum @ [id number], out @ 0937, withdrawal time 19 minutes  ? CORONARY ARTERY BYPASS GRAFT N/A 09/05/2016  ? Procedure: CORONARY ARTERY BYPASS GRAFTING (CABG) USING LIMA TO DISTAL LAD AND ENDOSCOPICALLY HARVESTED GREATER SAPHENOUS VEIN TO CIRC.;  Surgeon: Grace Isaac, MD;  Location: [place];  Service: Open Heart Surgery;  Laterality: N/A;  ? ENDOVEIN HARVEST OF GREATER SAPHENOUS VEIN Right 09/05/2016  ? Procedure: ENDOVEIN HARVEST OF GREATER SAPHENOUS VEIN;  Surgeon: Grace Isaac, MD;  Location: [place];  Service: Open Heart Surgery;  Laterality: Right;  ? POLYPECTOMY N/A 05/15/2014  ? Procedure: POLYPECTOMY;  Surgeon: Daneil Dolin, MD;  Location: [facility];  Service: Endoscopy;  Laterality: N/A;  ? RIGHT/LEFT HEART CATH AND CORONARY ANGIOGRAPHY N/A 08/29/2016  ? Procedure: Right/Left Heart Cath and Coronary Angiography;  Surgeon: Sherren Mocha, MD;  Location: [place] [facility];  Service: Cardiovascular;  Laterality: N/A;  ? TEE WITHOUT CARDIOVERSION N/A 09/05/2016  ? Procedure: TRANSESOPHAGEAL ECHOCARDIOGRAM (TEE);  Surgeon: Grace Isaac, MD;  Location: [facility];  Service: Open Heart Surgery;  Laterality: N/A;  ? ?Family History  ?Problem Relation Age of Onset  ? COPD Mother   ? Hypertension Sister   ? AAA (abdominal aortic aneurysm) Brother   ? Colon cancer Neg Hx   ? Liver disease Neg Hx   ? ?Social History  ? ?Socioeconomic History  ? Marital status: Married  ?  Spouse name: Leda Gauze  ? Number of children: Not on file  ? Years of education: 59  ? Highest education level: Not on file  ?Occupational History  ? Not on file  ?Tobacco Use  ? Smoking status: Former  ?  Packs/day: 1.50  ?  Years: 40.00  ?  Pack years: 60.00  ?  Types: Cigarettes  ?  Quit date: 04/16/2002  ?  Years since quitting: 19.0  ? Smokeless tobacco: Never  ?Vaping Use  ? Vaping Use: Never used  ?Substance and Sexual Activity  ?  Alcohol use: Yes  ?  Alcohol/week: 5.0 standard drinks  ?  Types: 5 Cans of beer per week  ?  Comment: per pt  ? Drug use: No  ? Sexual activity: Never  ?Other Topics Concern  ? Not on file  ?Social History Narrative  ? [facility] (09/13/16):  ? Previously worked in a Public librarian. Also worked Biomedical scientist. Currently has an outdoor cat and no indoor pets. No bird or mold exposure.  ? ?Social Determinants of Health  ? ?Financial Resource Strain: Low Risk   ? Difficulty of Paying Living Expenses: Not hard at all  ?Food Insecurity: No Food Insecurity  ? Worried About Charity fundraiser in the Last Year: Never true  ? Ran Out of Food in the Last Year: Never true  ?Transportation Needs: No Transportation Needs  ? Lack of Transportation (Medical): No  ? Lack of Transportation (Non-Medical): No  ?Physical Activity: Inactive  ? Days of Exercise per Week: 0 days  ? Minutes of Exercise per Session: 0 min  ?Stress: No Stress Concern Present  ? Feeling of Stress : Not at all  ?Social Connections: Socially Integrated  ? Frequency of Communication with Friends and Family: More than three times a week  ? Frequency of Social Gatherings with Friends and  Family: More than three times a week  ? Attends Religious Services: More than 4 times per year  ? Active Member of Clubs or Organizations: Yes  ? Attends Archivist Meetings: More than 4 times per year  ?

## 2021-05-12 ENCOUNTER — Other Ambulatory Visit: Payer: Self-pay | Admitting: *Deleted

## 2021-05-12 DIAGNOSIS — I10 Essential (primary) hypertension: Secondary | ICD-10-CM

## 2021-05-12 DIAGNOSIS — Z79899 Other long term (current) drug therapy: Secondary | ICD-10-CM

## 2021-05-12 DIAGNOSIS — E785 Hyperlipidemia, unspecified: Secondary | ICD-10-CM

## 2021-05-12 NOTE — Telephone Encounter (Signed)
Patient informed that lab work was ordered for his follow up visit. Verbalized understanding ?

## 2021-05-12 NOTE — Telephone Encounter (Signed)
Lipid, liver, metabolic 7 

## 2021-05-17 ENCOUNTER — Encounter: Payer: Self-pay | Admitting: Internal Medicine

## 2021-05-17 NOTE — Telephone Encounter (Signed)
[name] "Ken"  P [facility] (supporting [name], MD) 2 minutes ago (3:42 PM)  ? ?Just an FYI. About a month and a half after our visit on 02-15-2021, I started cutting the irbesartan 75 mg  in half as you suggested. It was reading close to 90/65 or low most times that I took it. It was better for a while. Then it started getting low again so I have stopped taking it for the past 3 weeks. I just took my blood pressure today and it was 122/76, 126/78, and 120/80. Seems like I'm doing better with out the medicine but I wanted you to know. Dayna Barker  ?

## 2021-05-22 ENCOUNTER — Other Ambulatory Visit: Payer: Self-pay | Admitting: Cardiology

## 2021-05-24 NOTE — Telephone Encounter (Signed)
Prescription refill request for Eliquis received. ?Indication: PAF ?Last office visit: 02/09/21  Ival Bible MD ?Scr: 0.65 on 03/29/21 ?Age: 71 ?Weight: 83.2kg ? ?Based on above findings Eliquis 5mg  twice daily is the appropriate dose.  Refill approved. ? ?

## 2021-05-27 DIAGNOSIS — I10 Essential (primary) hypertension: Secondary | ICD-10-CM | POA: Diagnosis not present

## 2021-05-27 DIAGNOSIS — E785 Hyperlipidemia, unspecified: Secondary | ICD-10-CM | POA: Diagnosis not present

## 2021-05-27 DIAGNOSIS — Z79899 Other long term (current) drug therapy: Secondary | ICD-10-CM | POA: Diagnosis not present

## 2021-05-28 LAB — BASIC METABOLIC PANEL
BUN/Creatinine Ratio: 15 (ref 10–24)
BUN: 9 mg/dL (ref 8–27)
CO2: 25 mmol/L (ref 20–29)
Calcium: 9.6 mg/dL (ref 8.6–10.2)
Chloride: 101 mmol/L (ref 96–106)
Creatinine, Ser: 0.59 mg/dL — ABNORMAL LOW (ref 0.76–1.27)
Glucose: 94 mg/dL (ref 70–99)
Potassium: 4.6 mmol/L (ref 3.5–5.2)
Sodium: 142 mmol/L (ref 134–144)
eGFR: 105 mL/min/{1.73_m2} (ref >59)

## 2021-05-28 LAB — HEPATIC FUNCTION PANEL
ALT: 24 IU/L (ref 0–44)
AST: 36 IU/L (ref 0–40)
Albumin: 4.4 g/dL (ref 3.8–4.8)
Alkaline Phosphatase: 79 IU/L (ref 44–121)
Bilirubin Total: 0.7 mg/dL (ref 0.0–1.2)
Bilirubin, Direct: 0.25 mg/dL (ref 0.00–0.40)
Total Protein: 6.7 g/dL (ref 6.0–8.5)

## 2021-05-28 LAB — LIPID PANEL
Chol/HDL Ratio: 3.1 ratio (ref 0.0–5.0)
Cholesterol, Total: 185 mg/dL (ref 100–199)
HDL: 59 mg/dL (ref >39)
LDL Chol Calc (NIH): 65 mg/dL (ref 0–99)
Triglycerides: 393 mg/dL — ABNORMAL HIGH (ref 0–149)
VLDL Cholesterol Cal: 61 mg/dL — ABNORMAL HIGH (ref 5–40)

## 2021-06-01 ENCOUNTER — Ambulatory Visit: Payer: Medicare Other | Admitting: Family Medicine

## 2021-06-06 DIAGNOSIS — J449 Chronic obstructive pulmonary disease, unspecified: Secondary | ICD-10-CM | POA: Diagnosis not present

## 2021-06-06 DIAGNOSIS — R0902 Hypoxemia: Secondary | ICD-10-CM | POA: Diagnosis not present

## 2021-06-08 ENCOUNTER — Ambulatory Visit (INDEPENDENT_AMBULATORY_CARE_PROVIDER_SITE_OTHER): Payer: Medicare Other | Admitting: Family Medicine

## 2021-06-08 ENCOUNTER — Encounter: Payer: Self-pay | Admitting: Family Medicine

## 2021-06-08 VITALS — BP 173/93 | HR 79 | Temp 97.5°F | Wt 179.2 lb

## 2021-06-08 DIAGNOSIS — I480 Paroxysmal atrial fibrillation: Secondary | ICD-10-CM | POA: Diagnosis not present

## 2021-06-08 DIAGNOSIS — E785 Hyperlipidemia, unspecified: Secondary | ICD-10-CM

## 2021-06-08 DIAGNOSIS — I10 Essential (primary) hypertension: Secondary | ICD-10-CM

## 2021-06-08 MED ORDER — DILTIAZEM HCL 60 MG PO TABS: ORAL_TABLET | ORAL | 1 refills | Status: DC

## 2021-06-08 MED ORDER — ROSUVASTATIN CALCIUM 40 MG PO TABS: ORAL_TABLET | ORAL | 1 refills | Status: DC

## 2021-06-08 MED ORDER — ESCITALOPRAM OXALATE 5 MG PO TABS: 5.0000 mg | ORAL_TABLET | Freq: Every day | ORAL | 1 refills | Status: DC

## 2021-06-08 NOTE — Progress Notes (Signed)
? ?  Subjective:  ? ? Patient ID: [name], male    DOB: 28-Oct-1950, 71 y.o.   MRN: [id number] ? ?HPI ?Pt here for follow up. Pt sees pulmonology and they changed blood pressure pills and blood pressure has been low.  ? ?Pt stopped taking Irbesartan about 5 weeks ago. Pt tends to get nervous when going out in a crowd and his breathing changes; pt states almost like a mid panic attack.  ? ? Low 80s without 02 when active without oxygen ?At rest 90 without ?With activity and 02 at 2L then 02 sat 94 02 daily ? ?Review of Systems ? ?   ?Objective:  ? Physical Exam ?General-in no acute distress ?Eyes-no discharge ?Lungs-respiratory rate normal, CTA ?CV-no murmurs,RRR ?Extremities skin warm dry no edema ?Neuro grossly normal ?Behavior normal, alert ? ?Results for orders placed or performed in visit on 05/12/21  ?Lipid Profile  ?Result Value Ref Range  ? Cholesterol, Total 185 100 - 199 mg/dL  ? Triglycerides 393 (H) 0 - 149 mg/dL  ? HDL 59 >39 mg/dL  ? VLDL Cholesterol Cal 61 (H) 5 - 40 mg/dL  ? LDL Chol Calc (NIH) 65 0 - 99 mg/dL  ? Chol/HDL Ratio 3.1 0.0 - 5.0 ratio  ?Hepatic function panel  ?Result Value Ref Range  ? Total Protein 6.7 6.0 - 8.5 g/dL  ? Albumin 4.4 3.8 - 4.8 g/dL  ? Bilirubin Total 0.7 0.0 - 1.2 mg/dL  ? Bilirubin, Direct 0.25 0.00 - 0.40 mg/dL  ? Alkaline Phosphatase 79 44 - 121 IU/L  ? AST 36 0 - 40 IU/L  ? ALT 24 0 - 44 IU/L  ?Basic Metabolic Panel (BMET)  ?Result Value Ref Range  ? Glucose 94 70 - 99 mg/dL  ? BUN 9 8 - 27 mg/dL  ? Creatinine, Ser 0.59 (L) 0.76 - 1.27 mg/dL  ? eGFR 105 >59 mL/min/1.73  ? BUN/Creatinine Ratio 15 10 - 24  ? Sodium 142 134 - 144 mmol/L  ? Potassium 4.6 3.5 - 5.2 mmol/L  ? Chloride 101 96 - 106 mmol/L  ? CO2 25 20 - 29 mmol/L  ? Calcium 9.6 8.6 - 10.2 mg/dL  ? ? ? ? ?   ?Assessment & Plan:  ?Blood pressure ?Doing well without the medicine ?Blood pressure readings at home look very good ?Blood pressure readings here are elevated ?He will bring his blood pressure  cuff and we will measure with his cuff and our cuff to see if they come close together ? ?COPD with hypoxia requires O2 ?Will be looking into new oxygen supplier he will let us know where he wants to go and we will help get this arranged ? ?Cholesterol profile looks good continue current medication healthy diet recommended ? ?Kidney functions glucose liver function look good ? ?

## 2021-06-18 ENCOUNTER — Other Ambulatory Visit (INDEPENDENT_AMBULATORY_CARE_PROVIDER_SITE_OTHER): Payer: Medicare Other | Admitting: Family Medicine

## 2021-06-18 VITALS — BP 114/72

## 2021-06-18 DIAGNOSIS — R030 Elevated blood-pressure reading, without diagnosis of hypertension: Secondary | ICD-10-CM

## 2021-06-18 NOTE — Patient Instructions (Signed)
Your blood pressure readings look good when taking into contacts that your blood pressure readings at home are fine but elevated here ? ?Your machine is accurate so therefore you have a element of whitecoat hypertension ? ?Continue all your medicines as is ? ?We will see you in November for wellness ? ?Follow-up sooner if any problems please take care-Dr. Lorin Picket ?

## 2021-06-18 NOTE — Progress Notes (Signed)
? ?  Subjective:  ? ? Patient ID: [name], male    DOB: [birth year], 71 y.o.   MRN: [id number] ? ?HPI ? ?Patient arrives for a blood pressure check ?BP 153/82 ?Patient is here today for blood pressure check ?I did a blood pressure check with his machine and with our wall unit ?They come very close together ?His blood pressure readings at home are excellent 114/72 ?Therefore he does have an element of whitecoat hypertension ?Continue diltiazem as ordered ? ?Review of Systems ? ?   ?Objective:  ? Physical Exam ? ? ? ? ?   ?Assessment & Plan:  ?Discussion with the patient regarding blood pressure ?With his blood pressure cuff correlating well with our readings here ?And also his blood pressure readings at home overall looking very good ?We will not add any additional medicine and patient will do his standard follow-up in the fall ? ?

## 2021-07-07 DIAGNOSIS — R0902 Hypoxemia: Secondary | ICD-10-CM | POA: Diagnosis not present

## 2021-07-07 DIAGNOSIS — J449 Chronic obstructive pulmonary disease, unspecified: Secondary | ICD-10-CM | POA: Diagnosis not present

## 2021-07-17 ENCOUNTER — Other Ambulatory Visit: Payer: Self-pay | Admitting: Family Medicine

## 2021-08-06 DIAGNOSIS — R0902 Hypoxemia: Secondary | ICD-10-CM | POA: Diagnosis not present

## 2021-08-06 DIAGNOSIS — J449 Chronic obstructive pulmonary disease, unspecified: Secondary | ICD-10-CM | POA: Diagnosis not present

## 2021-09-06 DIAGNOSIS — J449 Chronic obstructive pulmonary disease, unspecified: Secondary | ICD-10-CM | POA: Diagnosis not present

## 2021-09-06 DIAGNOSIS — R0902 Hypoxemia: Secondary | ICD-10-CM | POA: Diagnosis not present

## 2021-09-15 ENCOUNTER — Telehealth: Payer: Self-pay | Admitting: Cardiology

## 2021-09-15 DIAGNOSIS — Z952 Presence of prosthetic heart valve: Secondary | ICD-10-CM

## 2021-09-15 NOTE — Telephone Encounter (Signed)
Pt's wife is calling requesting an order be placed for an echo per Dr. Ival Bible request before his next appt on 02/08

## 2021-10-04 ENCOUNTER — Encounter: Payer: Self-pay | Admitting: Internal Medicine

## 2021-10-04 ENCOUNTER — Ambulatory Visit: Payer: Medicare Other | Admitting: Internal Medicine

## 2021-10-04 DIAGNOSIS — J449 Chronic obstructive pulmonary disease, unspecified: Secondary | ICD-10-CM

## 2021-10-04 DIAGNOSIS — R0902 Hypoxemia: Secondary | ICD-10-CM

## 2021-10-04 DIAGNOSIS — J9611 Chronic respiratory failure with hypoxia: Secondary | ICD-10-CM

## 2021-10-04 DIAGNOSIS — I10 Essential (primary) hypertension: Secondary | ICD-10-CM | POA: Diagnosis not present

## 2021-10-04 NOTE — Assessment & Plan Note (Signed)
Quit smoking x 2005 with Onset of symptoms  Around 2018  - PFT's  12/29/2020  FEV1 1.09 (32 % ) ratio 0.35  p 1 % improvement from saba p ? prior to study with DLCO  12.41 (40%) corrects to 2.51 (55%)  for alv volume and FV curve classicall concave  - 12/29/2020   breztri trial and off acei. Labs ordered 12/29/2020  :  allergy profile Eos 0.1/ IgE 4    alpha one AT phenotype   MM - 02/15/2021  After extensive coaching inhaler device,  effectiveness =    90% with SMI > stiolto trial and again off acei for UACS/ hoarseness > much better 03/29/2021   Pt is Group B in terms of symptom/risk and laba/lama therefore appropriate rx at this point >>>  stiolto approp plus prn saba  Re SABA :  I spent extra time with pt today reviewing appropriate use of albuterol for prn use on exertion with the following points: 1) saba is for relief of sob that does not improve by walking a slower pace or resting but rather if the pt does not improve after trying this first. 2) If the pt is convinced, as many are, that saba helps recover from activity faster then it's easy to tell if this is the case by re-challenging : ie stop, take the inhaler, then p 5 minutes try the exact same activity (intensity of workload) that just caused the symptoms and see if they are substantially diminished or not after saba 3) if there is an activity that reproducibly causes the symptoms, try the saba 15 min before the activity on alternate days   If in fact the saba really does help, then fine to continue to use it prn but advised may need to look closer at the maintenance regimen being used to achieve better control of airways disease with exertion.

## 2021-10-04 NOTE — Patient Instructions (Addendum)
If there are activities where your breathing is holding you back, please check your 02 saturation at peak exercise (when you are short of breath)   Make sure you check your oxygen saturation  AT  your highest level of activity (not after you stop)   to be sure it stays over 90% and adjust  02 flow upward to maintain this level if needed but remember to turn it back to previous settings when you stop (to conserve your supply).   If BP staying less than 90 on top or you get light headed standing, try taking the ibesartan 75 mg tablet one half every other day but let Dr Diona Browner adjust/ refill at your next visit  Please remember to go to the lab department   for your tests - we will call you with the results when they are available.       Please schedule a follow up visit in 6 months but call sooner if needed

## 2021-10-04 NOTE — Progress Notes (Signed)
[name], male    DOB: January 03, 1951,    MRN: [id number]   Brief patient profile:  38  yowm MM/quit smoking 2004 s much resp cc  referred to pulmonary clinic in [place]  12/29/2020 by Dr  Gerda Diss for copd GOLD 3 dx 2018 / newly 02 rx x around 10/2020  Pt is s/p AVR 08/2016   History of Present Illness  12/29/2020  Pulmonary/ 1st office eval/ Lauramae Kneisley / [name] / [name]  Chief Complaint  Patient presents with   Consult    COPD for 15 years. Diagnosed by Dr. Lilyan Punt.   2LO2 pulse portable oxygen. 2LO2 cont. At home and while sleeping. Has been on O2 for 2 months.   Dyspnea:  2lpm and 30 yards to street slt incline to house and back but does not check 02  Cough: none Sleep: 2lpm hs /flat  SABA use: rarely = once a week hfa Rec Stop trelegy and lisinopril  Benicar (olemasartan) 20 mg one daily  Plan A = Automatic = Always=  breztri Take 2 puffs first thing in am and then another 2 puffs about 12 hours later.  Work on inhaler technique:   Plan B = Backup (to supplement plan A, not to replace it) Only use your albuterol inhaler as a rescue medication Make sure you check your oxygen saturation  AT  your highest level of activity (not after you stop)  office visit in 6 weeks, call sooner if needed with all medications /inhalers/ solutions in hand  02/15/2021  f/u ov/[place] office/Nyna Chilton re: COPD GOLD 3/ ? acei case  maint on breztri  Chief Complaint  Patient presents with   Follow-up    Feels breathing has improved "a little bit but not a lot"  2LO2 cont. And pulse while out.   88% 2LO2 pulse form lobby to room.  92& 2LO2 pulse after sitting for approx. 5 min.   Dyspnea:  mb and back is without 02 /  same for grocery store too  Cough: worse assoc very hoarse x 3 days p back on ACEi x 3 weeks  Sleeping: flat bed on 2lpm  SABA use: maybe once  02: 2lpm HS and daytime  Covid status: vax x 5  Lung cancer screening: > 15 y since  Quit   Rec Plan A = Automatic =  Always=    Stiolto 2 puffs each am 1st thing  Plan B = Backup (to supplement plan A, not to replace it) Only use your albuterol inhaler as a rescue medication Stop lisinopril and take ibesartan 75 mg one daily  (ok to break in half if too strong)  Make sure you check your oxygen saturation  AT  your highest level of activity (not after you stop)   to be sure it stays over 90%  Please schedule a follow up office visit in 6 weeks, call sooner if needed     03/29/2021  f/u ov/[place] office/Calem Cocozza re: GOLD 3/ 0 2 hs and prn   maint on sttiolto 2 each am   Chief Complaint  Patient presents with   Follow-up    Feels breathing is continuing to improve.  From lobby to room no SOB O2 sats 96% 2LO2 pulse.   Dyspnea:  mb and back better and not consistent with 02 with ex  or checking as rec  Cough: none  Sleeping: on side / bed is flat/ 2 pillows  SABA use: none 02: 2lpm hs and prn day time  Covid status: all per pt    Rec Please remember to go to the lab department   for your tests - we will call you with the results when they are available. Ok to try trelegy one click each am x one month, then breztri Take 2 puffs first thing in am and then another 2 puffs about 12 hours later x one month to compare to stiolto and call us with your preference Make sure you check your oxygen saturation  AT  your highest level of activity (not after you stop)   to be sure it stays over 90%  Please schedule a follow up visit in 6  months but call sooner if needed     10/04/2021  f/u ov/[place] office/Leray Garverick re: GOLD 3  maint on stiolto (no change vs other 2 triples   Chief Complaint  Patient presents with   Follow-up    Patient is having a hard time with bp med. Breathing doing ok   Dyspnea:  no problem mailbox and back and sat low 90s  Cough: = none  Sleeping: flat bed/ on side/one pillow  SABA use: twice weekly at most  02: 2lpm hs / rarely daytime       No obvious day to day or daytime variability  or assoc excess/ purulent sputum or mucus plugs or hemoptysis or cp or chest tightness, subjective wheeze or overt sinus or hb symptoms.   Sleeping  without nocturnal  or early am exacerbation  of respiratory  c/o's or need for noct saba. Also denies any obvious fluctuation of symptoms with weather or environmental changes or other aggravating or alleviating factors except as outlined above   No unusual exposure hx or h/o childhood pna/ asthma or knowledge of premature birth.  Current Allergies, Complete Past Medical History, Past Surgical History, Family History, and Social History were reviewed in [employer] record.  ROS  The following are not active complaints unless bolded Hoarseness, sore throat, dysphagia, dental problems, itching, sneezing,  nasal congestion or discharge of excess mucus or purulent secretions, ear ache,   fever, chills, sweats, unintended wt loss or wt gain, classically pleuritic or exertional cp,  orthopnea pnd or arm/hand swelling  or leg swelling, presyncope, palpitations, abdominal pain, anorexia, nausea, vomiting, diarrhea  or change in bowel habits or change in bladder habits, change in stools or change in urine, dysuria, hematuria,  rash, arthralgias, visual complaints, headache, numbness, weakness or ataxia or problems with walking or coordination,  change in mood or  memory.        Current Meds  Medication Sig   acetaminophen (TYLENOL) 500 MG tablet Take 500 mg by mouth every 6 (six) hours as needed for moderate pain.    albuterol (VENTOLIN HFA) 108 (90 Base) MCG/ACT inhaler INHALE 2 PUFFS BY MOUTH EVERY 4 HOURS AS NEEDED   apixaban (ELIQUIS) 5 MG TABS tablet TAKE 1 TABLET BY MOUTH TWICE A DAY   diltiazem (CARDIZEM) 60 MG tablet TAKE 1 TABLET BY MOUTH TWICE A DAY   escitalopram (LEXAPRO) 5 MG tablet Take 1 tablet (5 mg total) by mouth daily.   fexofenadine (ALLEGRA) 180 MG tablet Take 180 mg by mouth daily.   fluticasone (FLONASE) 50  MCG/ACT nasal spray SPRAY 2 SPRAYS INTO EACH NOSTRIL EVERY DAY   irbesartan (AVAPRO) 75 MG tablet Take 75 mg by mouth daily.   Multiple Vitamin (MULTIVITAMIN) tablet Take 1 tablet by mouth daily.   rosuvastatin (CRESTOR) 40 MG tablet TAKE 1  TABLET BY MOUTH EVERY DAY   Tiotropium Bromide-Olodaterol (STIOLTO RESPIMAT) 2.5-2.5 MCG/ACT AERS Inhale 2 puffs into the lungs daily.   vitamin C (ASCORBIC ACID) 500 MG tablet Take 500 mg by mouth daily.                 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   Hyperlipidemia    Myocardial infarction [facility])    Postoperative atrial fibrillation ([facility]) 2018   White coat hypertension        Objective:    10/04/2021       171  03/29/2021       180   02/15/21 181 lb 1.9 oz (82.2 kg)  02/09/21 183 lb 6.4 oz (83.2 kg)  01/21/21 179 lb 8 oz (81.4 kg)   Vital signs reviewed  10/04/2021  - Note at rest 02 sats  93% on 2lpm continous  and on  RA p sitting 94%    General appearance:    pleasant amb wm nad   HEENT : Oropharynx  clear/dentition ok   Nasal turbinates nl    NECK :  without  apparent JVD/ palpable Nodes/TM    LUNGS: no acc muscle use,  Mild barrel  contour chest wall with bilateral  Distant bs s audible wheeze and  without cough on insp or exp maneuvers  and mild  Hyperresonant  to  percussion bilaterally     CV:  RRR  no s3 or murmur or increase in P2, and no edema   ABD:  soft and nontender with pos end  insp Hoover's  in the supine position.  No bruits or organomegaly appreciated   MS:  Nl gait/ ext warm without deformities Or obvious joint restrictions  calf tenderness, cyanosis or  clubbing     SKIN: warm and dry without lesions    NEURO:  alert, approp, nl sensorium with  no motor or cerebellar deficits apparent.         Lab Results  Component Value Date   WBC 5.7 10/12/2021   HGB 13.5 10/12/2021   HCT 43.2 10/12/2021   MCV 93 10/12/2021   PLT 192 10/12/2021             Assessment

## 2021-10-04 NOTE — Assessment & Plan Note (Signed)
Placed on 02 around 10/2020 - 12/29/2020   Walked on 2lpm pulsed x  3  lap(s) =  approx 450 @ moderate then slower pace, stopped due to end of study with onset of doe during 2nd with lowest 02 sats 90%  - 03/29/2021   Walked on RA  x  1  lap(s) =  approx 150  ft  @ mod pace, stopped due to  lowest 02 sats 88%  Then   Place on 2LO2 pulse and sats increased to 93%. Walked last two laps at a moderate pace on 2LO2 pulse lowest sat 90%  - sat on RA at rest 94% 10/04/2021   Does fine on RA at rest so needs to remember to titrate amb 02 so doesn't rune out   Specifically  Make sure you check your oxygen saturation  AT  your highest level of activity (not after you stop)   to be sure it stays over 90% and adjust  02 flow upward to maintain this level if needed but remember to turn it back to previous settings when you stop (to conserve your supply).   F/u 6 m, sooner prn          Each maintenance medication was reviewed in detail including emphasizing most importantly the difference between maintenance and prns and under what circumstances the prns are to be triggered using an action plan format where appropriate.  Total time for H and P, chart review, counseling, reviewing smi/hfa/neb/02  device(s) and generating customized AVS unique to this office visit / same day charting  > 30 min

## 2021-10-04 NOTE — Assessment & Plan Note (Addendum)
Trial off acei and on ARB 12/29/2020 for atypical copd with mild pseudowheeze - 02/15/2021 bp too low on benicar 20 so rec avapro 75 and adjust from one-half to one up to bid > 03/29/2021 on avapro 75 mg one daily good control / less cough / pseudowheeze   Lab Results  Component Value Date   CREATININE 0.56 (L) 10/12/2021   CREATININE 0.61 (L) 10/04/2021   CREATININE 0.59 (L) 05/27/2021     On the low side but tol well > fine tune per cards/ pcp but avoid acei indefinitely

## 2021-10-07 DIAGNOSIS — J449 Chronic obstructive pulmonary disease, unspecified: Secondary | ICD-10-CM | POA: Diagnosis not present

## 2021-10-07 DIAGNOSIS — R0902 Hypoxemia: Secondary | ICD-10-CM | POA: Diagnosis not present

## 2021-10-07 LAB — CBC WITH DIFFERENTIAL/PLATELET
Basophils Absolute: 0.1 10*3/uL (ref 0.0–0.2)
Basos: 1 %
EOS (ABSOLUTE): 0.1 10*3/uL (ref 0.0–0.4)
Eos: 1 %
Hematocrit: 43.5 % (ref 37.5–51.0)
Hemoglobin: 14.8 g/dL (ref 13.0–17.7)
Immature Grans (Abs): 0 10*3/uL (ref 0.0–0.1)
Immature Granulocytes: 0 %
Lymphocytes Absolute: 1.9 10*3/uL (ref 0.7–3.1)
Lymphs: 31 %
MCH: 30.5 pg (ref 26.6–33.0)
MCHC: 34 g/dL (ref 31.5–35.7)
MCV: 90 fL (ref 79–97)
Monocytes Absolute: 0.9 10*3/uL (ref 0.1–0.9)
Monocytes: 15 %
Neutrophils Absolute: 3.2 10*3/uL (ref 1.4–7.0)
Neutrophils: 52 %
Platelets: 126 10*3/uL — ABNORMAL LOW (ref 150–450)
RBC: 4.86 x10E6/uL (ref 4.14–5.80)
RDW: 14.6 % (ref 11.6–15.4)
WBC: 6.2 10*3/uL (ref 3.4–10.8)

## 2021-10-07 LAB — BASIC METABOLIC PANEL
BUN/Creatinine Ratio: 25 — ABNORMAL HIGH (ref 10–24)
BUN: 15 mg/dL (ref 8–27)
CO2: 22 mmol/L (ref 20–29)
Calcium: 9.4 mg/dL (ref 8.6–10.2)
Chloride: 88 mmol/L — ABNORMAL LOW (ref 96–106)
Creatinine, Ser: 0.61 mg/dL — ABNORMAL LOW (ref 0.76–1.27)
Glucose: 88 mg/dL (ref 70–99)
Potassium: 4.2 mmol/L (ref 3.5–5.2)
Sodium: 128 mmol/L — ABNORMAL LOW (ref 134–144)
eGFR: 104 mL/min/{1.73_m2} (ref >59)

## 2021-10-08 ENCOUNTER — Other Ambulatory Visit: Payer: Self-pay

## 2021-10-08 DIAGNOSIS — J9611 Chronic respiratory failure with hypoxia: Secondary | ICD-10-CM

## 2021-10-12 ENCOUNTER — Other Ambulatory Visit: Payer: Self-pay

## 2021-10-12 DIAGNOSIS — J9611 Chronic respiratory failure with hypoxia: Secondary | ICD-10-CM

## 2021-10-13 LAB — BASIC METABOLIC PANEL
BUN/Creatinine Ratio: 16 (ref 10–24)
BUN: 9 mg/dL (ref 8–27)
CO2: 26 mmol/L (ref 20–29)
Calcium: 9.8 mg/dL (ref 8.6–10.2)
Chloride: 98 mmol/L (ref 96–106)
Creatinine, Ser: 0.56 mg/dL — ABNORMAL LOW (ref 0.76–1.27)
Glucose: 101 mg/dL — ABNORMAL HIGH (ref 70–99)
Potassium: 4.5 mmol/L (ref 3.5–5.2)
Sodium: 137 mmol/L (ref 134–144)
eGFR: 107 mL/min/{1.73_m2} (ref >59)

## 2021-10-13 LAB — CBC
Hematocrit: 43.2 % (ref 37.5–51.0)
Hemoglobin: 13.5 g/dL (ref 13.0–17.7)
MCH: 28.9 pg (ref 26.6–33.0)
MCHC: 31.3 g/dL — ABNORMAL LOW (ref 31.5–35.7)
MCV: 93 fL (ref 79–97)
Platelets: 192 10*3/uL (ref 150–450)
RBC: 4.67 x10E6/uL (ref 4.14–5.80)
RDW: 14.8 % (ref 11.6–15.4)
WBC: 5.7 10*3/uL (ref 3.4–10.8)

## 2021-11-02 ENCOUNTER — Other Ambulatory Visit: Payer: Self-pay | Admitting: Family Medicine

## 2021-11-06 DIAGNOSIS — R0902 Hypoxemia: Secondary | ICD-10-CM | POA: Diagnosis not present

## 2021-11-06 DIAGNOSIS — J449 Chronic obstructive pulmonary disease, unspecified: Secondary | ICD-10-CM | POA: Diagnosis not present

## 2021-11-30 ENCOUNTER — Ambulatory Visit (INDEPENDENT_AMBULATORY_CARE_PROVIDER_SITE_OTHER): Payer: Medicare Other | Admitting: Family Medicine

## 2021-11-30 DIAGNOSIS — J441 Chronic obstructive pulmonary disease with (acute) exacerbation: Secondary | ICD-10-CM

## 2021-11-30 MED ORDER — PREDNISONE 50 MG PO TABS: ORAL_TABLET | ORAL | 0 refills | Status: DC

## 2021-11-30 MED ORDER — AMOXICILLIN-POT CLAVULANATE 875-125 MG PO TABS: 1.0000 | ORAL_TABLET | Freq: Two times a day (BID) | ORAL | 0 refills | Status: DC

## 2021-11-30 NOTE — Patient Instructions (Signed)
Meds as prescribed.  If you worsen or fail to improve, please let us know.  Take care  Dr. Lacinda Axon

## 2021-12-01 DIAGNOSIS — J441 Chronic obstructive pulmonary disease with (acute) exacerbation: Secondary | ICD-10-CM | POA: Insufficient documentation

## 2021-12-01 NOTE — Assessment & Plan Note (Signed)
Treating with prednisone and Augmentin.

## 2021-12-01 NOTE — Progress Notes (Signed)
Subjective:  Patient ID: [name], male    DOB: [birth year]  Age: 71 y.o. MRN: [id number]  CC: Chief Complaint  Patient presents with   Cough   Headache    Non productive, runny nose, pain behind eyes, no fever, neg covid test, had sore throat better today all sympoms started sunday    HPI:  71 year old male with hypertension, atrial fibrillation, CAD, AAA, COPD on home oxygen presents with respiratory symptoms.  Started Sunday.  He has had congestion and a headache.  He is also had a cough.  Mildly productive.  No reports of increasing shortness of breath.  He uses oxygen with activity.  He is concerned about his respiratory symptoms and worsening of his COPD in the setting of illness.  No fever.  No relieving factors.  Patient Active Problem List   Diagnosis Date Noted   COPD exacerbation (HCC) 12/01/2021   Essential hypertension 12/29/2020   Chronic respiratory failure with hypoxia (HCC) 12/29/2020   Atherosclerosis of native coronary artery of native heart with angina pectoris (HCC) 05/04/2020   S/P AAA repair using bifurcation graft 12/11/2017   Atrial fibrillation (HCC) 09/16/2016   S/P AVR 09/05/2016   S/P CABG x 2 09/05/2016   Fatty liver 12/09/2014   AAA (abdominal aortic aneurysm) without rupture (HCC) 12/09/2014   Elevated liver enzymes 12/01/2014   History of colonic polyps    Prediabetes 11/27/2013   Hyperlipidemia 05/02/2013   COPD GOLD 3 with hypoxia (HCC) 06/14/2012    Social Hx   Social History   Socioeconomic History   Marital status: Married    Spouse name: Jola Babinski   Number of children: Not on file   Years of education: 12   Highest education level: Not on file  Occupational History   Not on file  Tobacco Use   Smoking status: Former    Packs/day: 1.50    Years: 40.00    Total pack years: 60.00    Types: Cigarettes    Quit date: 04/16/2002    Years since quitting: 19.6   Smokeless tobacco: Never  Vaping Use   Vaping Use: Never used   Substance and Sexual Activity   Alcohol use: Yes    Alcohol/week: 5.0 standard drinks of alcohol    Types: 5 Cans of beer per week    Comment: per pt   Drug use: No   Sexual activity: Never  Other Topics Concern   Not on file  Social History Narrative   [facility] (09/13/16):   Previously worked in a Tax inspector. Also worked Production designer, theatre/television/film. Currently has an outdoor cat and no indoor pets. No bird or mold exposure.   Social Determinants of Health   Financial Resource Strain: Low Risk  (05/11/2021)   Overall Financial Resource Strain (CARDIA)    Difficulty of Paying Living Expenses: Not hard at all  Food Insecurity: No Food Insecurity (05/11/2021)   Hunger Vital Sign    Worried About Running Out of Food in the Last Year: Never true    Ran Out of Food in the Last Year: Never true  Transportation Needs: No Transportation Needs (05/11/2021)   PRAPARE - Administrator, Civil Service (Medical): No    Lack of Transportation (Non-Medical): No  Physical Activity: Inactive (05/11/2021)   Exercise Vital Sign    Days of Exercise per Week: 0 days    Minutes of Exercise per Session: 0 min  Stress: No Stress Concern Present (05/11/2021)   [place]  [facility]    Feeling of Stress : Not at all  Social Connections: Socially Integrated (05/11/2021)   Social Connection and Isolation Panel [NHANES]    Frequency of Communication with Friends and Family: More than three times a week    Frequency of Social Gatherings with Friends and Family: More than three times a week    Attends Religious Services: More than 4 times per year    Active Member of [employer] or Organizations: Yes    Attends Music therapist: More than 4 times per year    Marital Status: Married    Review of Systems Per HPI  Objective:  BP 116/69   Pulse 87   Temp 97.8 F (36.6 C)   Ht 5\' 9"  (1.753 m)   Wt 177 lb (80.3 kg)   SpO2 (!) 87%   BMI  26.14 kg/m      11/30/2021    2:36 PM 10/04/2021    8:44 AM 06/18/2021    2:49 PM  BP/Weight  Systolic BP 086 578 469  Diastolic BP 69 58 72  Wt. (Lbs) 177 171.8   BMI 26.14 kg/m2 25.37 kg/m2     Physical Exam Vitals and nursing note reviewed.  Constitutional:      General: He is not in acute distress. HENT:     Head: Normocephalic and atraumatic.     Nose: Congestion present.  Eyes:     General:        Right eye: No discharge.        Left eye: No discharge.     Conjunctiva/sclera: Conjunctivae normal.  Cardiovascular:     Rate and Rhythm: Normal rate and regular rhythm.  Pulmonary:     Effort: Pulmonary effort is normal.     Breath sounds: Rales present. No wheezing.  Neurological:     Mental Status: He is alert.  Psychiatric:        Mood and Affect: Mood normal.        Behavior: Behavior normal.     Lab Results  Component Value Date   WBC 5.7 10/12/2021   HGB 13.5 10/12/2021   HCT 43.2 10/12/2021   PLT 192 10/12/2021   GLUCOSE 101 (H) 10/12/2021   CHOL 185 05/27/2021   TRIG 393 (H) 05/27/2021   HDL 59 05/27/2021   LDLCALC 65 05/27/2021   ALT 24 05/27/2021   AST 36 05/27/2021   NA 137 10/12/2021   K 4.5 10/12/2021   CL 98 10/12/2021   CREATININE 0.56 (L) 10/12/2021   BUN 9 10/12/2021   CO2 26 10/12/2021   TSH 1.701 09/12/2016   PSA 1.7 11/13/2017   INR 1.14 12/11/2017   HGBA1C 5.8 (H) 09/01/2016     Assessment & Plan:   Problem List Items Addressed This Visit       Respiratory   COPD exacerbation ([place])    Treating with prednisone and Augmentin.      Relevant Medications   predniSONE (DELTASONE) 50 MG tablet    Meds ordered this encounter  Medications   predniSONE (DELTASONE) 50 MG tablet    Sig: 1 tablet daily x 5 days    Dispense:  5 tablet    Refill:  0   amoxicillin-clavulanate (AUGMENTIN) 875-125 MG tablet    Sig: Take 1 tablet by mouth 2 (two) times daily.    Dispense:  14 tablet    Refill:  0    Follow-up:  Return if  symptoms worsen or fail to improve.  Everlene Other DO [facility]

## 2021-12-07 DIAGNOSIS — R0902 Hypoxemia: Secondary | ICD-10-CM | POA: Diagnosis not present

## 2021-12-07 DIAGNOSIS — J449 Chronic obstructive pulmonary disease, unspecified: Secondary | ICD-10-CM | POA: Diagnosis not present

## 2021-12-09 ENCOUNTER — Telehealth: Payer: Self-pay

## 2021-12-09 DIAGNOSIS — Z125 Encounter for screening for malignant neoplasm of prostate: Secondary | ICD-10-CM

## 2021-12-09 DIAGNOSIS — E785 Hyperlipidemia, unspecified: Secondary | ICD-10-CM

## 2021-12-09 DIAGNOSIS — Z79899 Other long term (current) drug therapy: Secondary | ICD-10-CM

## 2021-12-09 NOTE — Telephone Encounter (Signed)
Lipid, liver, PSA 

## 2021-12-09 NOTE — Telephone Encounter (Signed)
Caller name: GREG CRATTY  On DPR?: Yes  Call back number: [phone] (mobile)  Provider they see: Kathyrn Drown, MD  Reason for call:Pt has 16 of Nov needs blood work ordered.

## 2021-12-09 NOTE — Telephone Encounter (Signed)
Labs ordered and pt is aware 

## 2021-12-09 NOTE — Telephone Encounter (Signed)
Last labs completed 05/27/21 BMET, Hepatic and Lipid. Please advise. Thank you

## 2021-12-17 DIAGNOSIS — E785 Hyperlipidemia, unspecified: Secondary | ICD-10-CM | POA: Diagnosis not present

## 2021-12-17 DIAGNOSIS — Z79899 Other long term (current) drug therapy: Secondary | ICD-10-CM | POA: Diagnosis not present

## 2021-12-18 LAB — HEPATIC FUNCTION PANEL
ALT: 22 IU/L (ref 0–44)
AST: 30 IU/L (ref 0–40)
Albumin: 4.5 g/dL (ref 3.9–4.9)
Alkaline Phosphatase: 63 IU/L (ref 44–121)
Bilirubin Total: 0.7 mg/dL (ref 0.0–1.2)
Bilirubin, Direct: 0.23 mg/dL (ref 0.00–0.40)
Total Protein: 7.1 g/dL (ref 6.0–8.5)

## 2021-12-18 LAB — LIPID PANEL
Chol/HDL Ratio: 2.8 ratio (ref 0.0–5.0)
Cholesterol, Total: 208 mg/dL — ABNORMAL HIGH (ref 100–199)
HDL: 75 mg/dL (ref >39)
LDL Chol Calc (NIH): 106 mg/dL — ABNORMAL HIGH (ref 0–99)
Triglycerides: 155 mg/dL — ABNORMAL HIGH (ref 0–149)
VLDL Cholesterol Cal: 27 mg/dL (ref 5–40)

## 2021-12-18 LAB — PSA: Prostate Specific Ag, Serum: 3.3 ng/mL (ref 0.0–4.0)

## 2021-12-23 ENCOUNTER — Ambulatory Visit (INDEPENDENT_AMBULATORY_CARE_PROVIDER_SITE_OTHER): Payer: Medicare Other | Admitting: Family Medicine

## 2021-12-23 VITALS — BP 127/76 | HR 90 | Temp 98.2°F | Ht 69.0 in | Wt 176.0 lb

## 2021-12-23 DIAGNOSIS — Z0000 Encounter for general adult medical examination without abnormal findings: Secondary | ICD-10-CM

## 2021-12-23 DIAGNOSIS — J019 Acute sinusitis, unspecified: Secondary | ICD-10-CM | POA: Diagnosis not present

## 2021-12-23 DIAGNOSIS — E785 Hyperlipidemia, unspecified: Secondary | ICD-10-CM | POA: Diagnosis not present

## 2021-12-23 MED ORDER — CEFDINIR 300 MG PO CAPS: 300.0000 mg | ORAL_CAPSULE | Freq: Two times a day (BID) | ORAL | 0 refills | Status: DC

## 2021-12-23 MED ORDER — EZETIMIBE 10 MG PO TABS: 10.0000 mg | ORAL_TABLET | Freq: Every day | ORAL | 3 refills | Status: DC

## 2021-12-23 MED ORDER — ESCITALOPRAM OXALATE 10 MG PO TABS: 10.0000 mg | ORAL_TABLET | Freq: Every day | ORAL | 1 refills | Status: DC

## 2021-12-23 MED ORDER — ESCITALOPRAM OXALATE 10 MG PO TABS: 10.0000 mg | ORAL_TABLET | Freq: Every day | ORAL | 5 refills | Status: DC

## 2021-12-23 NOTE — Patient Instructions (Signed)
Hi [name]  It was good to see you today  Overall your you are doing well  Should you have any ongoing troubles let us know  Please do your cholesterol blood work in 3 months  We sent in your new medication to add to what you are doing  Keep everything else the same  TakeCare-Dr. Lorin Picket

## 2021-12-23 NOTE — Progress Notes (Deleted)
   Subjective:    Patient ID: [name], male    DOB: 10-Oct-1950, 71 y.o.   MRN: [id number]  HPI  The patient comes in today for a wellness visit.    A review of their health history was completed.  A review of medications was also completed.  Any needed refills; ***  Eating habits: ***  Falls/  MVA accidents in past few months: ***  Regular exercise: ***  Specialist pt sees on regular basis: ***  Preventative health issues were discussed.   Additional concerns: ***   Review of Systems     Objective:   Physical Exam        Assessment & Plan:

## 2021-12-23 NOTE — Progress Notes (Signed)
   Subjective:    Patient ID: [name], male    DOB: 09/21/1950, 71 y.o.   MRN: [id number]  HPI  AWV- Annual Wellness Visit  The patient was seen for their annual wellness visit. The patient's past medical history, surgical history, and family history were reviewed. Pertinent vaccines were reviewed ( tetanus, pneumonia, shingles, flu) The patient's medication list was reviewed and updated.  The height and weight were entered.  BMI recorded in electronic record elsewhere  Cognitive screening was completed. Outcome of Mini - Cog: 5   Falls /depression screening electronically recorded within record elsewhere  Current tobacco usage:no (All patients who use tobacco were given written and verbal information on quitting)  Recent listing of emergency department/hospitalizations over the past year were reviewed.  current specialist the patient sees on a regular basis: cardiology- dr  Cindra Eves, dr wert pulmonology, vascular vein   Medicare annual wellness visit patient questionnaire was reviewed.  A written screening schedule for the patient for the next 5-10 years was given. Appropriate discussion of followup regarding next visit was discussed.     Review of Systems     Objective:   Physical Exam  General-in no acute distress Eyes-no discharge Lungs-respiratory rate normal, CTA CV-no murmurs,RRR Extremities skin warm dry no edema Neuro grossly normal Behavior normal, alert Rectal exam prostate exam normal      Assessment & Plan:  1. Acute rhinosinusitis Antibiotic prescribed warning signs discussed  2. Well adult exam Adult wellness-complete.wellness physical was conducted today. Importance of diet and exercise were discussed in detail.  Importance of stress reduction and healthy living were discussed.  In addition to this a discussion regarding safety was also covered.  We also reviewed over immunizations and gave recommendations regarding current  immunization needed for age.   In addition to this additional areas were also touched on including: Preventative health exams needed:  Colonoscopy 2026  Patient was advised yearly wellness exam   3. Encounter for subsequent annual wellness visit (AWV) in Medicare patient Passes mini cog Overall doing well   4. Hyperlipidemia, unspecified hyperlipidemia type Continue statin LDL above goal Add Zetia Recheck labs in a few months Follow-up in 6 months  Bump up dose of his Lexapro to help with daily anxiety

## 2021-12-26 ENCOUNTER — Other Ambulatory Visit: Payer: Self-pay | Admitting: Family Medicine

## 2021-12-27 ENCOUNTER — Other Ambulatory Visit: Payer: Self-pay

## 2021-12-27 DIAGNOSIS — E785 Hyperlipidemia, unspecified: Secondary | ICD-10-CM

## 2021-12-27 DIAGNOSIS — Z79899 Other long term (current) drug therapy: Secondary | ICD-10-CM

## 2022-01-06 DIAGNOSIS — R0902 Hypoxemia: Secondary | ICD-10-CM | POA: Diagnosis not present

## 2022-01-06 DIAGNOSIS — J449 Chronic obstructive pulmonary disease, unspecified: Secondary | ICD-10-CM | POA: Diagnosis not present

## 2022-02-06 DIAGNOSIS — J449 Chronic obstructive pulmonary disease, unspecified: Secondary | ICD-10-CM | POA: Diagnosis not present

## 2022-02-06 DIAGNOSIS — R0902 Hypoxemia: Secondary | ICD-10-CM | POA: Diagnosis not present

## 2022-02-16 ENCOUNTER — Other Ambulatory Visit: Payer: Self-pay | Admitting: Cardiology

## 2022-02-16 DIAGNOSIS — Z952 Presence of prosthetic heart valve: Secondary | ICD-10-CM

## 2022-02-16 DIAGNOSIS — I359 Nonrheumatic aortic valve disorder, unspecified: Secondary | ICD-10-CM

## 2022-02-17 DIAGNOSIS — D485 Neoplasm of uncertain behavior of skin: Secondary | ICD-10-CM | POA: Diagnosis not present

## 2022-02-17 DIAGNOSIS — D225 Melanocytic nevi of trunk: Secondary | ICD-10-CM | POA: Diagnosis not present

## 2022-02-17 DIAGNOSIS — L570 Actinic keratosis: Secondary | ICD-10-CM | POA: Diagnosis not present

## 2022-02-17 DIAGNOSIS — Z1283 Encounter for screening for malignant neoplasm of skin: Secondary | ICD-10-CM | POA: Diagnosis not present

## 2022-02-17 DIAGNOSIS — X32XXXA Exposure to sunlight, initial encounter: Secondary | ICD-10-CM | POA: Diagnosis not present

## 2022-02-17 DIAGNOSIS — D2272 Melanocytic nevi of left lower limb, including hip: Secondary | ICD-10-CM | POA: Diagnosis not present

## 2022-02-17 DIAGNOSIS — B078 Other viral warts: Secondary | ICD-10-CM | POA: Diagnosis not present

## 2022-02-28 ENCOUNTER — Telehealth: Payer: Self-pay | Admitting: Internal Medicine

## 2022-02-28 MED ORDER — STIOLTO RESPIMAT 2.5-2.5 MCG/ACT IN AERS: 2.0000 | INHALATION_SPRAY | Freq: Every day | RESPIRATORY_TRACT | 11 refills | Status: DC

## 2022-02-28 NOTE — Telephone Encounter (Signed)
Will run out of his medication before next sched appt w/Dr. Melvyn Novas. ([place]). Please call to advise. [phone]  Pharm: [facility] in [place]

## 2022-02-28 NOTE — Telephone Encounter (Signed)
Rx sent to [facility] [place].  Nothing further needed at this time.

## 2022-03-01 ENCOUNTER — Other Ambulatory Visit: Payer: Self-pay | Admitting: *Deleted

## 2022-03-01 DIAGNOSIS — Z9889 Other specified postprocedural states: Secondary | ICD-10-CM

## 2022-03-01 DIAGNOSIS — I739 Peripheral vascular disease, unspecified: Secondary | ICD-10-CM

## 2022-03-02 ENCOUNTER — Other Ambulatory Visit: Payer: Self-pay | Admitting: Family Medicine

## 2022-03-09 ENCOUNTER — Ambulatory Visit: Payer: Medicare Other | Attending: Cardiology

## 2022-03-09 DIAGNOSIS — J449 Chronic obstructive pulmonary disease, unspecified: Secondary | ICD-10-CM | POA: Diagnosis not present

## 2022-03-09 DIAGNOSIS — R0902 Hypoxemia: Secondary | ICD-10-CM | POA: Diagnosis not present

## 2022-03-09 DIAGNOSIS — I359 Nonrheumatic aortic valve disorder, unspecified: Secondary | ICD-10-CM | POA: Diagnosis not present

## 2022-03-10 LAB — ECHOCARDIOGRAM COMPLETE
AV Mean grad: 33 mmHg
AV Peak grad: 54.3 mmHg
Ao pk vel: 3.68 m/s
Area-P 1/2: 2.91 cm2
Calc EF: 71.4 %
MV M vel: 2.06 m/s
MV Peak grad: 17 mmHg
S' Lateral: 3 cm
Single Plane A2C EF: 71.9 %
Single Plane A4C EF: 71.2 %

## 2022-03-11 NOTE — Progress Notes (Unsigned)
HISTORY AND PHYSICAL     CC:  follow up. For EVAR Requesting Provider:  Kathyrn Drown, MD  HPI: This is a 72 y.o. male who is here today for follow up for AAA and is s/p EVAR on  12/12/2019 by Dr. Oneida Alar.  Pt was last seen 01/21/2021 and at that time, his mobility was limited by COPD and was on home O2. His EVAR duplex revealed the AAA sac was decreased in size and there was no endoleak.    The pt returns today for follow up studies.  He denies any claudication, rest pain or non healing wounds.  He state he had a little swelling in the left leg but it has resolved.  He is on O2 when he is doing something strenuous.    The pt is on a statin for cholesterol management.    The pt is not on an aspirin.    Other AC:  Eliquis The pt is on CCB, ARB for hypertension.  The pt does not have diabetes. 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 July 2018   Hyperlipidemia    Myocardial infarction [facility])    Postoperative atrial fibrillation ([place]) 2018   White coat hypertension     Past Surgical History:  Procedure Laterality Date   ABDOMINAL AORTIC ENDOVASCULAR STENT GRAFT N/A 12/11/2017   Procedure: ABDOMINAL AORTIC ENDOVASCULAR STENT GRAFT;  Surgeon: Elam Dutch, MD;  Location: [facility];  Service: Vascular;  Laterality: N/A;   ANKLE SURGERY Right 2009   AORTIC VALVE REPLACEMENT N/A 09/05/2016   Procedure: AORTIC VALVE REPLACEMENT (AVR) USING 23 MM MAGNA EASE PERICARDIAL TISSUE VALVE;  Surgeon: Grace Isaac, MD;  Location: [facility];  Service: Open Heart Surgery;  Laterality:  N/A;   APPENDECTOMY     CARDIAC CATHETERIZATION N/A 02/02/2016   Procedure: Left Heart Cath and Coronary Angiography;  Surgeon: Charolette Forward, MD;  Location: [facility];  Service: Cardiovascular;  Laterality: N/A;   COLONOSCOPY  06/22/2010   [id number] rectal and right colon polyps removed remainder rectum and colon appared normal   COLONOSCOPY WITH PROPOFOL N/A 05/15/2014   Procedure: COLONOSCOPY WITH PROPOFOL;  Surgeon: Daneil Dolin, MD;  Location: [facility];  Service: Endoscopy;  Laterality: N/A;  In cecum @ 0918, out @ 0937, withdrawal time 19 minutes   CORONARY ARTERY BYPASS GRAFT N/A 09/05/2016   Procedure: CORONARY ARTERY BYPASS GRAFTING (CABG) USING LIMA TO DISTAL LAD AND ENDOSCOPICALLY HARVESTED GREATER SAPHENOUS VEIN TO CIRC.;  Surgeon: Grace Isaac, MD;  Location: [facility];  Service: Open Heart Surgery;  Laterality: N/A;   ENDOVEIN HARVEST OF GREATER SAPHENOUS VEIN Right 09/05/2016   Procedure: ENDOVEIN HARVEST OF GREATER SAPHENOUS VEIN;  Surgeon: Grace Isaac, MD;  Location: [facility];  Service: Open Heart Surgery;  Laterality: Right;   POLYPECTOMY N/A 05/15/2014   Procedure: POLYPECTOMY;  Surgeon: Daneil Dolin, MD;  [facility];  Service: Endoscopy;  Laterality: N/A;   RIGHT/LEFT HEART CATH  AND CORONARY ANGIOGRAPHY N/A 08/29/2016   Procedure: Right/Left Heart Cath and Coronary Angiography;  Surgeon: Tonny Bollman, MD;  Location: [facility];  Service: Cardiovascular;  Laterality: N/A;   TEE WITHOUT CARDIOVERSION N/A 09/05/2016   Procedure: TRANSESOPHAGEAL ECHOCARDIOGRAM (TEE);  Surgeon: Delight Ovens, MD;  Location: [facility];  Service: Open Heart Surgery;  Laterality: N/A;    Allergies  Allergen Reactions   Ciprofloxacin     Artificial heart valve    Current Outpatient Medications  Medication Sig Dispense Refill   acetaminophen (TYLENOL) 500 MG tablet Take 500 mg by mouth every 6 (six) hours as needed for moderate pain.      albuterol (VENTOLIN HFA)  108 (90 Base) MCG/ACT inhaler TAKE 2 PUFFS BY MOUTH EVERY 4 HOURS AS NEEDED 18 each 2   apixaban (ELIQUIS) 5 MG TABS tablet TAKE 1 TABLET BY MOUTH TWICE A DAY 60 tablet 11   cefdinir (OMNICEF) 300 MG capsule Take 1 capsule (300 mg total) by mouth 2 (two) times daily. 20 capsule 0   diltiazem (CARDIZEM) 60 MG tablet TAKE 1 TABLET BY MOUTH TWICE A DAY 180 tablet 1   escitalopram (LEXAPRO) 10 MG tablet Take 1 tablet (10 mg total) by mouth daily. 90 tablet 1   ezetimibe (ZETIA) 10 MG tablet Take 1 tablet (10 mg total) by mouth daily. 90 tablet 3   fexofenadine (ALLEGRA) 180 MG tablet Take 180 mg by mouth daily.     fluticasone (FLONASE) 50 MCG/ACT nasal spray SPRAY 2 SPRAYS INTO EACH NOSTRIL EVERY DAY 16 mL 5   irbesartan (AVAPRO) 75 MG tablet Take 75 mg by mouth daily.     Multiple Vitamin (MULTIVITAMIN) tablet Take 1 tablet by mouth daily.     OXYGEN Inhale into the lungs.     rosuvastatin (CRESTOR) 40 MG tablet TAKE 1 TABLET BY MOUTH EVERY DAY 90 tablet 1   Tiotropium Bromide-Olodaterol (STIOLTO RESPIMAT) 2.5-2.5 MCG/ACT AERS Inhale 2 puffs into the lungs daily. 4 g 11   vitamin C (ASCORBIC ACID) 500 MG tablet Take 500 mg by mouth daily.      No current facility-administered medications for this visit.    Family History  Problem Relation Age of Onset   COPD Mother    Hypertension Sister    AAA (abdominal aortic aneurysm) Brother    Colon cancer Neg Hx    Liver disease Neg Hx     Social History   Socioeconomic History   Marital status: Married    Spouse name: Jola Babinski   Number of children: Not on file   Years of education: 12   Highest education level: Not on file  Occupational History   Not on file  Tobacco Use   Smoking status: Former    Packs/day: 1.50    Years: 40.00    Total pack years: 60.00    Types: Cigarettes    Quit date: 04/16/2002    Years since quitting: 19.9   Smokeless tobacco: Never  Vaping Use   Vaping Use: Never used  Substance and Sexual Activity    Alcohol use: Yes    Alcohol/week: 5.0 standard drinks of alcohol    Types: 5 Cans of beer per week    Comment: per pt   Drug use: No   Sexual activity: Never  Other Topics Concern   Not on file  Social History Narrative   [facility] (09/13/16):   Previously worked in a Tax inspector. Also worked Production designer, theatre/television/film. Currently has an  outdoor cat and no indoor pets. No bird or mold exposure.   Social Determinants of Health   Financial Resource Strain: Low Risk  (05/11/2021)   Overall Financial Resource Strain (CARDIA)    Difficulty of Paying Living Expenses: Not hard at all  Food Insecurity: No Food Insecurity (05/11/2021)   Hunger Vital Sign    Worried About Running Out of Food in the Last Year: Never true    Ran Out of Food in the Last Year: Never true  Transportation Needs: No Transportation Needs (05/11/2021)   PRAPARE - Hydrologist (Medical): No    Lack of Transportation (Non-Medical): No  Physical Activity: Inactive (05/11/2021)   Exercise Vital Sign    Days of Exercise per Week: 0 days    Minutes of Exercise per Session: 0 min  Stress: No Stress Concern Present (05/11/2021)   [place]    Feeling of Stress : Not at all  Social Connections: [place] (05/11/2021)   Social Connection and Isolation Panel [NHANES]    Frequency of Communication with Friends and Family: More than three times a week    Frequency of Social Gatherings with Friends and Family: More than three times a week    Attends Religious Services: More than 4 times per year    Active Member of [employer] or Organizations: Yes    Attends Music therapist: More than 4 times per year    Marital Status: Married  Human resources officer Violence: Not At Risk (05/11/2021)   Humiliation, Afraid, Rape, and Kick questionnaire    Fear of Current or Ex-Partner: No    Emotionally Abused: No    Physically Abused: No     Sexually Abused: No     REVIEW OF SYSTEMS:   [X]  denotes positive finding, [ ]  denotes negative finding Cardiac  Comments:  Chest pain or chest pressure:    Shortness of breath upon exertion: x   Short of breath when lying flat:    Irregular heart rhythm:        Vascular    Pain in calf, thigh, or hip brought on by ambulation:    Pain in feet at night that wakes you up from your sleep:     Blood clot in your veins:    Leg swelling:         Pulmonary    Oxygen at home:    Productive cough:     Wheezing:         Neurologic    Sudden weakness in arms or legs:     Sudden numbness in arms or legs:     Sudden onset of difficulty speaking or slurred speech:    Temporary loss of vision in one eye:     Problems with dizziness:         Gastrointestinal    Blood in stool:     Vomited blood:         Genitourinary    Burning when urinating:     Blood in urine:        Psychiatric    Major depression:         Hematologic    Bleeding problems:    Problems with blood clotting too easily:        Skin    Rashes or ulcers:        Constitutional    Fever or chills:      PHYSICAL EXAMINATION:  Today's Vitals   03/14/22 0837  BP: (!) 164/87  Pulse: 68  Resp: 20  Temp: 98.6 F (37 C)  TempSrc: Temporal  SpO2: 95%  Weight: 176 lb (79.8 kg)  Height: 5\' 9"  (1.753 m)   Body mass index is 25.99 kg/m.   General:  WDWN in NAD; vital signs documented above Gait: Not observed HENT: WNL, normocephalic Pulmonary: normal non-labored breathing on O2NC Cardiac: regular HR;  without carotid bruits Abdomen: soft, NT; aortic pulse is not palpable Skin: without rashes Vascular Exam/Pulses:  Right Left  Radial 2+ (normal) 2+ (normal)  Femoral 2+ (normal) 2+ (normal)  Popliteal Unable to palpate Unable to palpate   AT Monophasic doppler Monophasic doppler  PT Brisk Monophasic doppler Monophasic doppler   Extremities: without open wounds Musculoskeletal: no muscle wasting  or atrophy  Neurologic: A&O X 3 Psychiatric:  The pt has Normal affect.   Non-Invasive Vascular Imaging:   EVAR Arterial duplex on 03/14/2022: Endovascular Aortic Repair (EVAR):  +----------+----------------+-------------------+-------------------+           Diameter AP (cm)Diameter Trans (cm)Velocities (cm/sec)  +----------+----------------+-------------------+-------------------+  Aorta    3.52            3.66               47                   +----------+----------------+-------------------+-------------------+  Right Limb1.26            1.27               79                   +----------+----------------+-------------------+-------------------+  Left Limb 1.18            1.12               69                   +----------+----------------+-------------------+-------------------+   Summary:  Abdominal Aorta: Patent endovascular aneurysm repair with no evidence of endoleak. The largest aortic diameter has decreased compared to prior exam. Previous diameter measurement was 4.1 cm obtained on 01/21/21   ABI/TBI 03/14/2022: Right:  0.71/0.53 Great toe pressure 75 Left:  0.82/0.52 Great toe pressure 74  Previous ABI 12/12/2017: Right:  0.54  Left:  0.66   Previous EVAR arterial duplex on 01/21/2021: Endovascular Aortic Repair (EVAR):  +----------+----------------+-------------------+-------------------+           Diameter AP (cm)Diameter Trans (cm)Velocities (cm/sec)  +----------+----------------+-------------------+-------------------+  Aorta    4.07            3.75               58                   +----------+----------------+-------------------+-------------------+  Right Limb1.02            1.00               80                   +----------+----------------+-------------------+-------------------+  Left Limb 1.02            0.90               141                  +----------+----------------+-------------------+-------------------+   Summary:  Abdominal Aorta: The largest aortic diameter has decreased compared to prior  exam. Previous diameter measurement was obtained on 01/22/2020    ASSESSMENT/PLAN:: 72 y.o. male here with hx of  EVAR on  12/12/2019 by Dr. Darrick Penna.  -AAA duplex reveals that AAA sac has decreased in size to 3.6 from 4.1cm and there is no endoleak. -pt doing well.  His ABI has improved from previous study and he is not having any claudication, rest pain or non healing wounds.  He knows to call if he develops any slow healing wounds.  -continue statin.  Pt on Eliquis and not asa.  -pt will f/u in one year with EVAR duplex.  I do not see where he has had BLE arterial duplex of popliteal arteries.  We will evaluate that at his next visit.   -discussed that given his and his brother's hx of AAA, his children wound need to be evaluated for AAA at some point.  He expressed understanding.    Doreatha Massed, [facility] [phone]  Clinic MD:   Myra Gianotti

## 2022-03-14 ENCOUNTER — Ambulatory Visit (INDEPENDENT_AMBULATORY_CARE_PROVIDER_SITE_OTHER)
Admission: RE | Admit: 2022-03-14 | Discharge: 2022-03-14 | Disposition: A | Discharge status: Home / Self Care | Payer: Medicare Other | Source: Ambulatory Visit | Attending: Surgery | Admitting: Surgery

## 2022-03-14 ENCOUNTER — Ambulatory Visit (HOSPITAL_COMMUNITY)
Admission: RE | Admit: 2022-03-14 | Discharge: 2022-03-14 | Disposition: A | Discharge status: Home / Self Care | Payer: Medicare Other | Source: Ambulatory Visit | Attending: Surgery | Admitting: Surgery

## 2022-03-14 ENCOUNTER — Ambulatory Visit: Payer: Medicare Other | Admitting: Physician Assistant

## 2022-03-14 VITALS — BP 164/87 | HR 68 | Temp 98.6°F | Resp 20 | Ht 69.0 in | Wt 176.0 lb

## 2022-03-14 DIAGNOSIS — Z9889 Other specified postprocedural states: Secondary | ICD-10-CM

## 2022-03-14 DIAGNOSIS — I739 Peripheral vascular disease, unspecified: Secondary | ICD-10-CM

## 2022-03-14 LAB — VAS US ABI WITH/WO TBI
Left ABI: 0.82
Right ABI: 0.71

## 2022-03-17 ENCOUNTER — Ambulatory Visit: Payer: Medicare Other | Attending: Cardiology | Admitting: Cardiology

## 2022-03-17 ENCOUNTER — Encounter: Payer: Self-pay | Admitting: Cardiology

## 2022-03-17 VITALS — BP 144/84 | HR 78 | Ht 69.0 in | Wt 174.8 lb

## 2022-03-17 DIAGNOSIS — I25119 Atherosclerotic heart disease of native coronary artery with unspecified angina pectoris: Secondary | ICD-10-CM

## 2022-03-17 DIAGNOSIS — I480 Paroxysmal atrial fibrillation: Secondary | ICD-10-CM

## 2022-03-17 DIAGNOSIS — I10 Essential (primary) hypertension: Secondary | ICD-10-CM

## 2022-03-17 DIAGNOSIS — Z952 Presence of prosthetic heart valve: Secondary | ICD-10-CM

## 2022-03-17 MED ORDER — IRBESARTAN 75 MG PO TABS: 75.0000 mg | ORAL_TABLET | Freq: Every day | ORAL | 2 refills | Status: DC

## 2022-03-17 NOTE — Patient Instructions (Addendum)
Medication Instructions:  Your physician recommends that you continue on your current medications as directed. Please refer to the Current Medication list given to you today.  Labwork: none  Testing/Procedures: Your physician has requested that you have an echocardiogram in 6 months just before next visit. Echocardiography is a painless test that uses sound waves to create images of your heart. It provides your doctor with information about the size and shape of your heart and how well your heart's chambers and valves are working. This procedure takes approximately one hour. There are no restrictions for this procedure. Please do NOT wear cologne, perfume, aftershave, or lotions (deodorant is allowed). Please arrive 15 minutes prior to your appointment time.  Follow-Up: Your physician recommends that you schedule a follow-up appointment in: 6 months  Any Other Special Instructions Will Be Listed Below (If Applicable).  If you need a refill on your cardiac medications before your next appointment, please call your pharmacy. 

## 2022-03-17 NOTE — Progress Notes (Signed)
Cardiology Office Note  Date: 03/17/2022   ID: [name], DOB 05-Aug-1950, MRN [id number]  PCP:  Kathyrn Drown, MD  Cardiologist:  Rozann Lesches, MD Electrophysiologist:  None   Chief Complaint  Patient presents with   Cardiac follow-up    History of Present Illness: [name] is a 72 y.o. male last seen in January 2023.  He is here for a routine visit.  States that he has been feeling fairly well, generally NYHA class II dyspnea, no angina or palpitations.  He uses oxygen intermittently as before.  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  Echocardiogram from 2018 postoperatively demonstrated a mean AV gradient of 20 mmHg with dimensionless index of 0.51 at that point.  I went over his medications which are noted below.  He reports compliance with therapy.  He is currently on Crestor with addition of Zetia most recently, LDL was 106 with follow-up planned by PCP on new regimen.  Past Medical History:  Diagnosis Date   AAA (abdominal aortic aneurysm) ([place])    Status post EVAR November 2019   Arthritis    COPD (chronic obstructive pulmonary disease) ([facility])    Coronary artery disease    Status post LIMA to LAD and SVG to circumflex July 2018   Essential hypertension    Fatty liver    History of aortic stenosis    Status post 23 mm Magna Ease pericardial AVR July 2018   Hyperlipidemia    Myocardial infarction ([facility])    Paroxysmal atrial fibrillation ([facility])    White coat hypertension     Current Outpatient Medications  Medication Sig Dispense Refill   acetaminophen (TYLENOL) 500 MG tablet Take 500 mg by mouth every 6 (six) hours as needed for moderate pain.      albuterol (VENTOLIN HFA) 108 (90 Base) MCG/ACT inhaler TAKE 2 PUFFS BY MOUTH EVERY 4 HOURS AS NEEDED 18 each 2    apixaban (ELIQUIS) 5 MG TABS tablet TAKE 1 TABLET BY MOUTH TWICE A DAY 60 tablet 11   diltiazem (CARDIZEM) 60 MG tablet TAKE 1 TABLET BY MOUTH TWICE A DAY 180 tablet 1   escitalopram (LEXAPRO) 10 MG tablet Take 1 tablet (10 mg total) by mouth daily. 90 tablet 1   ezetimibe (ZETIA) 10 MG tablet Take 1 tablet (10 mg total) by mouth daily. 90 tablet 3   fexofenadine (ALLEGRA) 180 MG tablet Take 180 mg by mouth daily.     fluticasone (FLONASE) 50 MCG/ACT nasal spray SPRAY 2 SPRAYS INTO EACH NOSTRIL EVERY DAY 16 mL 5   Multiple Vitamin (MULTIVITAMIN) tablet Take 1 tablet by mouth daily.     OXYGEN Inhale into the lungs.     rosuvastatin (CRESTOR) 40 MG tablet TAKE 1 TABLET BY MOUTH EVERY DAY 90 tablet 1   Tiotropium Bromide-Olodaterol (STIOLTO RESPIMAT) 2.5-2.5 MCG/ACT AERS Inhale 2 puffs into the lungs daily. 4 g 11   vitamin C (ASCORBIC ACID) 500 MG tablet Take 500 mg by mouth daily.      irbesartan (AVAPRO) 75 MG tablet Take 1 tablet (75 mg total) by mouth daily. 90 tablet 2   No current facility-administered medications for this visit.   Allergies:  Ciprofloxacin   ROS:  No syncope.  Physical Exam: VS:  BP (!) 144/84   Pulse 78   Ht 5\' 9"  (1.753 m)  Wt 174 lb 12.8 oz (79.3 kg)   SpO2 97% Comment: 2 lpm supplemental oxygen  BMI 25.81 kg/m , BMI Body mass index is 25.81 kg/m.  Wt Readings from Last 3 Encounters:  03/17/22 174 lb 12.8 oz (79.3 kg)  03/14/22 176 lb (79.8 kg)  12/23/21 176 lb (79.8 kg)    General: Patient appears comfortable at rest. HEENT: Conjunctiva and lids normal. Neck: Supple, no elevated JVP or carotid bruits. Lungs: Clear to auscultation, nonlabored breathing at rest. Cardiac: Regular rate and rhythm, no S3, 2/6 systolic murmur. Extremities: No pitting edema.  ECG:  An ECG dated 11/18/2019 was personally reviewed today and demonstrated:  Sinus rhythm with anteroseptal Q waves.  Recent Labwork: 10/12/2021: BUN 9; Creatinine, Ser 0.56; Hemoglobin 13.5;  Platelets 192; Potassium 4.5; Sodium 137 12/17/2021: ALT 22; AST 30     Component Value Date/Time   CHOL 208 (H) 12/17/2021 0845   TRIG 155 (H) 12/17/2021 0845   HDL 75 12/17/2021 0845   CHOLHDL 2.8 12/17/2021 0845   CHOLHDL 3.1 02/19/2018 0838   VLDL 45 (H) 05/16/2016 0856   LDLCALC 106 (H) 12/17/2021 0845   LDLCALC 78 02/19/2018 0838    Other Studies Reviewed Today:  Echocardiogram 03/09/2022:  1. Left ventricular ejection fraction, by estimation, is 65 to 70%. The  left ventricle has normal function. The left ventricle has no regional  wall motion abnormalities. Left ventricular diastolic parameters are  indeterminate. The average left  ventricular global longitudinal strain is -19.0 %. The global longitudinal  strain is normal.   2. Right ventricular systolic function is normal. The right ventricular  size is normal. Tricuspid regurgitation signal is inadequate for assessing  PA pressure.   3. The mitral valve is normal in structure. No evidence of mitral valve  regurgitation. No evidence of mitral stenosis.   4. 23 mm Magna Ease pericardial valve is in the AV position. Moderate  gradient across the valve. Aortic valve mean gradient measures 33.0 mmHg.  Aortic valve peak gradient measures 54.3 mmHg.. The aortic valve has an  indeterminant number of cusps. There  is moderate calcification of the aortic valve. There is moderate  thickening of the aortic valve. Aortic valve regurgitation is not  visualized. Moderate to severe aortic valve stenosis.   5. The inferior vena cava is normal in size with greater than 50%  respiratory variability, suggesting right atrial pressure of 3 mmHg.   Assessment and Plan:  1.  History of aortic stenosis status post bioprosthetic AVR with 23 mm Magna Ease pericardial valve in 2018.  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  No obvious symptomatic impact at this time, although we did discuss implications and plan for follow-up.  Would recheck echo in 6 months for now.  2.  CAD status post CABG in 2018.  He reports no active angina.  Currently not on aspirin given use of Eliquis.  He is on Crestor and Zetia with plan for further intensification of LDL control.  This will be rechecked by PCP.  3.  Paroxysmal atrial fibrillation with CHA2DS2-VASc score of 3.  He reports no palpitations and is symptomatically stable on Cardizem and Eliquis for stroke prophylaxis.  I reviewed his interval lab work.  He reports no spontaneous bleeding problems.  4.  Essential hypertension and whitecoat hypertension.  Currently on irbesartan which will be refilled.  Medication  Adjustments/Labs and Tests Ordered: Current medicines are reviewed at length with the patient today.  Concerns regarding medicines are outlined above.   Tests Ordered: Orders Placed This Encounter  Procedures   EKG 12-Lead   ECHOCARDIOGRAM COMPLETE    Medication Changes: Meds ordered this encounter  Medications   irbesartan (AVAPRO) 75 MG tablet    Sig: Take 1 tablet (75 mg total) by mouth daily.    Dispense:  90 tablet    Refill:  2    Disposition:  Follow up  6 months.  Signed, Satira Sark, MD, [facility] 03/17/2022 12:18 PM    [facility] [place], [place], [place] [address] Phone: [phone]; Fax: [phone]

## 2022-04-07 DIAGNOSIS — R0902 Hypoxemia: Secondary | ICD-10-CM | POA: Diagnosis not present

## 2022-04-07 DIAGNOSIS — J449 Chronic obstructive pulmonary disease, unspecified: Secondary | ICD-10-CM | POA: Diagnosis not present

## 2022-04-08 ENCOUNTER — Encounter: Payer: Self-pay | Admitting: Internal Medicine

## 2022-04-08 ENCOUNTER — Ambulatory Visit: Payer: Medicare Other | Admitting: Internal Medicine

## 2022-04-08 VITALS — BP 132/68 | HR 68 | Ht 69.0 in | Wt 176.4 lb

## 2022-04-08 DIAGNOSIS — J449 Chronic obstructive pulmonary disease, unspecified: Secondary | ICD-10-CM | POA: Diagnosis not present

## 2022-04-08 DIAGNOSIS — J9611 Chronic respiratory failure with hypoxia: Secondary | ICD-10-CM

## 2022-04-08 NOTE — Assessment & Plan Note (Signed)
Quit smoking x 2005 with Onset of symptoms  Around 2018  - PFT's  12/29/2020  FEV1 1.09 (32 % ) ratio 0.35  p 1 % improvement from saba p ? prior to study with DLCO  12.41 (40%) corrects to 2.51 (55%)  for alv volume and FV curve classicall concave  - 12/29/2020   breztri trial and off acei. Labs ordered 12/29/2020  :  allergy profile Eos 0.1/ IgE 4    alpha one AT phenotype   MM - 02/15/2021  After extensive coaching inhaler device,  effectiveness =    90% with SMI > stiolto trial and again off acei for UACS/ hoarseness > much better 03/29/2021   Pt is Group B in terms of symptom/risk and laba/lama therefore appropriate rx at this point >>>  stiolto plus approp saba  Re SABA :  I spent extra time with pt today reviewing appropriate use of albuterol for prn use on exertion with the following points: 1) saba is for relief of sob that does not improve by walking a slower pace or resting but rather if the pt does not improve after trying this first. 2) If the pt is convinced, as many are, that saba helps recover from activity faster then it's easy to tell if this is the case by re-challenging : ie stop, take the inhaler, then p 5 minutes try the exact same activity (intensity of workload) that just caused the symptoms and see if they are substantially diminished or not after saba 3) if there is an activity that reproducibly causes the symptoms, try the saba 15 min before the activity on alternate days   If in fact the saba really does help, then fine to continue to use it prn but advised may need to look closer at the maintenance regimen (for now stiolto)  being used to achieve better control of airways disease with exertion.

## 2022-04-08 NOTE — Progress Notes (Signed)
[name], male    DOB: 1950-07-31,    MRN: [id number]   Brief patient profile:  46  yowm MM/quit smoking 2004 s much resp cc  referred to pulmonary clinic in [place]  12/29/2020 by Dr  [name] for [place] 3 dx 2018 / newly 02 rx x around 10/2020  Pt is s/p AVR 08/2016   History of Present Illness  12/29/2020  Pulmonary/ 1st office eval/ Sonal Dorwart / [name] / [name]  Chief Complaint  Patient presents with   Consult    COPD for 15 years. Diagnosed by Dr. Sallee Lange.   2LO2 pulse portable oxygen. 2LO2 cont. At home and while sleeping. Has been on O2 for 2 months.   Dyspnea:  2lpm and 30 yards to street slt incline to house and back but does not check 02  Cough: none Sleep: 2lpm hs /flat  SABA use: rarely = once a week hfa Rec Stop trelegy and lisinopril  Benicar (olemasartan) 20 mg one daily  Plan A = Automatic = Always=  breztri Take 2 puffs first thing in am and then another 2 puffs about 12 hours later.  Work on inhaler technique:   Plan B = Backup (to supplement plan A, not to replace it) Only use your albuterol inhaler as a rescue medication Make sure you check your oxygen saturation  AT  your highest level of activity (not after you stop)  office visit in 6 weeks, call sooner if needed with all medications /inhalers/ solutions in hand  02/15/2021  f/u ov/[place] office/Delvon Chipps re: COPD GOLD 3/ ? acei case  maint on breztri  Chief Complaint  Patient presents with   Follow-up    Feels breathing has improved "a little bit but not a lot"  2LO2 cont. And pulse while out.   88% 2LO2 pulse form lobby to room.  92& 2LO2 pulse after sitting for approx. 5 min.   Dyspnea:  mb and back is without 02 /  same for grocery store too  Cough: worse assoc very hoarse x 3 days p back on ACEi x 3 weeks  Sleeping: flat bed on 2lpm  SABA use: maybe once  02: 2lpm HS and daytime  Covid status: vax x 5  Lung cancer screening: > 15 y since  Quit   Rec Plan A = Automatic  = Always=    Stiolto 2 puffs each am 1st thing  Plan B = Backup (to supplement plan A, not to replace it) Only use your albuterol inhaler as a rescue medication Stop lisinopril and take ibesartan 75 mg one daily  (ok to break in half if too strong)  Make sure you check your oxygen saturation  AT  your highest level of activity (not after you stop)   to be sure it stays over 90%  Please schedule a follow up office visit in 6 weeks, call sooner if needed     03/29/2021  f/u ov/[place] office/Alzina Golda re: GOLD 3/ 0 2 hs and prn   maint on sttiolto 2 each am   Chief Complaint  Patient presents with   Follow-up    Feels breathing is continuing to improve.  From lobby to room no SOB O2 sats 96% 2LO2 pulse.   Dyspnea:  mb and back better and not consistent with 02 with ex  or checking as rec  Cough: none  Sleeping: on side / bed is flat/ 2 pillows  SABA use: none 02: 2lpm hs and prn day time  Covid status: all per pt    Rec Please remember to go to the lab department   for your tests - we will call you with the results when they are available. Ok to try trelegy one click each am x one month, then breztri Take 2 puffs first thing in am and then another 2 puffs about 12 hours later x one month to compare to stiolto and call us with your preference Make sure you check your oxygen saturation  AT  your highest level of activity (not after you stop)   to be sure it stays over 90%  Please schedule a follow up visit in 6  months but call sooner if needed     10/04/2021  f/u ov/[place] office/Tkai Serfass re: GOLD 3  maint on stiolto (no change vs other 2 triples)   Chief Complaint  Patient presents with   Follow-up    Patient is having a hard time with bp med. Breathing doing ok   Dyspnea:  no problem mailbox and back and sat low 90s  Cough: = none  Sleeping: flat bed/ on side/one pillow  SABA use: twice weekly at most  02: 2lpm hs / rarely daytime  Rec Make sure you check your oxygen saturation   AT  your highest level of activity (not after you stop)   to be sure it stays over 90%  If BP staying less than 90 on top or you get light headed standing, try taking the ibesartan 75 mg tablet one half every other day but let Dr Domenic Polite adjust/ refill at your next visit      04/08/2022  6 m f/u ov/[place] office/Joliana Claflin re: GOLD 3 COPD  maint on stiolto   Chief Complaint  Patient presents with   Follow-up    Breathing a little better since last ov  Dyspnea:  walked a quarter mile night prior to OV  on 2lpm POC  Cough: none Sleeping: no resp cc lying flat 2 pillows  SABA use: once or twice a week  02: 2lpm hs and prn POC none needed at rest Covid status: vax max  Lung cancer screening: > 15 y out so not eligible   No obvious day to day or daytime variability or assoc excess/ purulent sputum or mucus plugs or hemoptysis or cp or chest tightness, subjective wheeze or overt sinus or hb symptoms.   Sleeping as above without nocturnal  or early am exacerbation  of respiratory  c/o's or need for noct saba. Also denies any obvious fluctuation of symptoms with weather or environmental changes or other aggravating or alleviating factors except as outlined above   No unusual exposure hx or h/o childhood pna/ asthma or knowledge of premature birth.  Current Allergies, Complete Past Medical History, Past Surgical History, Family History, and Social History were reviewed in [employer] record.  ROS  The following are not active complaints unless bolded Hoarseness, sore throat, dysphagia, dental problems, itching, sneezing,  nasal congestion or discharge of excess mucus or purulent secretions, ear ache,   fever, chills, sweats, unintended wt loss or wt gain, classically pleuritic or exertional cp,  orthopnea pnd or arm/hand swelling  or leg swelling, presyncope, palpitations, abdominal pain, anorexia, nausea, vomiting, diarrhea  or change in bowel habits or change in bladder  habits, change in stools or change in urine, dysuria, hematuria,  rash, arthralgias, visual complaints, headache, numbness, weakness or ataxia or problems with walking or coordination,  change in mood or  memory.        Current Meds  Medication Sig   acetaminophen (TYLENOL) 500 MG tablet Take 500 mg by mouth every 6 (six) hours as needed for moderate pain.    albuterol (VENTOLIN HFA) 108 (90 Base) MCG/ACT inhaler TAKE 2 PUFFS BY MOUTH EVERY 4 HOURS AS NEEDED   apixaban (ELIQUIS) 5 MG TABS tablet TAKE 1 TABLET BY MOUTH TWICE A DAY   diltiazem (CARDIZEM) 60 MG tablet TAKE 1 TABLET BY MOUTH TWICE A DAY   escitalopram (LEXAPRO) 10 MG tablet Take 1 tablet (10 mg total) by mouth daily.   ezetimibe (ZETIA) 10 MG tablet Take 1 tablet (10 mg total) by mouth daily.   fexofenadine (ALLEGRA) 180 MG tablet Take 180 mg by mouth daily.   fluticasone (FLONASE) 50 MCG/ACT nasal spray SPRAY 2 SPRAYS INTO EACH NOSTRIL EVERY DAY   irbesartan (AVAPRO) 75 MG tablet Take 1 tablet (75 mg total) by mouth daily.   Multiple Vitamin (MULTIVITAMIN) tablet Take 1 tablet by mouth daily.   OXYGEN Inhale into the lungs.   rosuvastatin (CRESTOR) 40 MG tablet TAKE 1 TABLET BY MOUTH EVERY DAY   Tiotropium Bromide-Olodaterol (STIOLTO RESPIMAT) 2.5-2.5 MCG/ACT AERS Inhale 2 puffs into the lungs daily.   vitamin C (ASCORBIC ACID) 500 MG tablet Take 500 mg by mouth daily.                     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   Hyperlipidemia    Myocardial infarction [facility])    Postoperative atrial fibrillation ([place]) 2018   White coat hypertension        Objective:     Wts  04/08/2022         176  10/04/2021       171  03/29/2021       180   02/15/21 181 lb 1.9 oz (82.2 kg)  02/09/21 183 lb 6.4 oz (83.2 kg)  01/21/21 179 lb 8 oz (81.4 kg)   Vital signs reviewed  04/08/2022  - Note on arrival  02 sats  90% on 2lpm POC  and 96% p  5 min at rest RA  General appearance:    pleasant amb wm nad  HEENT : Oropharynx  clear    NECK :  without  apparent JVD/ palpable Nodes/TM    LUNGS: no acc muscle use,  Mild barrel  contour chest wall with bilateral  Distant bs s audible wheeze and  without cough on insp or exp maneuvers  and mild  Hyperresonant  to  percussion bilaterally     CV:  RRR  no s3 or murmur or increase in P2, and no edema   ABD:  soft and nontender with pos end  insp Hoover's  in the supine position.  No bruits or organomegaly appreciated   MS:  Nl gait/ ext warm without deformities Or obvious joint restrictions  calf tenderness, cyanosis or clubbing     SKIN: warm and dry without lesions    NEURO:  alert, approp, nl sensorium with  no motor or cerebellar deficits apparent.           Assessment

## 2022-04-08 NOTE — Patient Instructions (Signed)
No change in medications     Ok to try albuterol 15 min before an activity (on alternating days)  that you know would usually make you short of breath and see if it makes any difference and if makes none then don't take albuterol after activity unless you can't catch your breath as this means it's the resting that helps, not the albuterol.      Make sure you check your oxygen saturation  AT  your highest level of activity (not after you stop)   to be sure it stays over 90% and adjust  02 flow upward to maintain this level if needed but remember to turn it back to previous settings when you stop (to conserve your supply).   Please schedule a follow up visit in 12 months but call sooner if needed

## 2022-04-08 NOTE — Assessment & Plan Note (Signed)
Placed on 02 around 10/2020 - 12/29/2020   Walked on 2lpm pulsed x  3  lap(s) =  approx 450 @ moderate then slower pace, stopped due to end of study with onset of doe during 2nd with lowest 02 sats 90%  - 03/29/2021   Walked on RA  x  1  lap(s) =  approx 150  ft  @ mod pace, stopped due to  lowest 02 sats 88%  Then   Place on 2LO2 pulse and sats increased to 93%. Walked last two laps at a moderate pace on 2LO2 pulse lowest sat 90%  - sat on RA at rest 94% 10/04/2021  and 96% 04/08/2022  - .04/08/2022   Walked on RA  x  3  lap(s) =  approx 450  ft  @ mod pace, stopped due to end of study with lowest 02 sats 88% at end and mild sob p 150 ft but never stopped  Still needs 02 with heavy exertion or long walks  Make sure you check your oxygen saturation  AT  your highest level of activity (not after you stop)   to be sure it stays over 90% and adjust  02 flow upward to maintain this level if needed but remember to turn it back to previous settings when you stop (to conserve your supply).   F/u yearly, call sooner if needed  Each maintenance medication was reviewed in detail including emphasizing most importantly the difference between maintenance and prns and under what circumstances the prns are to be triggered using an action plan format where appropriate.  Total time for H and P, chart review, counseling, reviewing smi/hfa  device(s) , directly observing portions of ambulatory 02 saturation study/ and generating customized AVS unique to this office visit / same day charting = 24 min

## 2022-05-06 DIAGNOSIS — J449 Chronic obstructive pulmonary disease, unspecified: Secondary | ICD-10-CM | POA: Diagnosis not present

## 2022-05-06 DIAGNOSIS — R0902 Hypoxemia: Secondary | ICD-10-CM | POA: Diagnosis not present

## 2022-05-24 ENCOUNTER — Telehealth: Payer: Self-pay | Admitting: Family Medicine

## 2022-05-24 NOTE — Telephone Encounter (Signed)
Contacted [name] to schedule their annual wellness visit. Appointment made for 05/27/2022.   Thank you,  Judeth Cornfield,  AMB Clinical Support [facility] AWV Program Direct Dial ??[phone]

## 2022-05-26 NOTE — Patient Instructions (Signed)
[name] , Thank you for taking time to come for your Medicare Wellness Visit. I appreciate your ongoing commitment to your health goals. Please review the following plan we discussed and let me know if I can assist you in the future.   These are the goals we discussed:  Goals      Exercise 3x per week (30 min per time)     Try to increase exercise.         This is a list of the screening recommended for you and due dates:  Health Maintenance  Topic Date Due   DTaP/Tdap/Td vaccine (2 - Td or Tdap) 09/07/2020   COVID-19 Vaccine (6 - 2023-24 season) 12/27/2021   Flu Shot  09/08/2022   Medicare Annual Wellness Visit  05/27/2023   Colon Cancer Screening  05/14/2024   Pneumonia Vaccine  Completed   Hepatitis C Screening: USPSTF Recommendation to screen - Ages 18-79 yo.  Completed   Zoster (Shingles) Vaccine  Completed   HPV Vaccine  Aged Out    Advanced directives: Forms are available if you choose in the future to pursue completion.  This is recommended in order to make sure that your health wishes are honored in the event that you are unable to verbalize them to the provider.    Conditions/risks identified: Aim for 30 minutes of exercise or brisk walking, 6-8 glasses of water, and 5 servings of fruits and vegetables each day.  Next appointment: Follow up in one year for your annual wellness visit.   Preventive Care 19 Years and Older, Male  Preventive care refers to lifestyle choices and visits with your health care provider that can promote health and wellness. What does preventive care include? A yearly physical exam. This is also called an annual well check. Dental exams once or twice a year. Routine eye exams. Ask your health care provider how often you should have your eyes checked. Personal lifestyle choices, including: Daily care of your teeth and gums. Regular physical activity. Eating a healthy diet. Avoiding tobacco and drug use. Limiting alcohol  use. Practicing safe sex. Taking low doses of aspirin every day. Taking vitamin and mineral supplements as recommended by your health care provider. What happens during an annual well check? The services and screenings done by your health care provider during your annual well check will depend on your age, overall health, lifestyle risk factors, and family history of disease. Counseling  Your health care provider may ask you questions about your: Alcohol use. Tobacco use. Drug use. Emotional well-being. Home and relationship well-being. Sexual activity. Eating habits. History of falls. Memory and ability to understand (cognition). Work and work Astronomer. Screening  You may have the following tests or measurements: Height, weight, and BMI. Blood pressure. Lipid and cholesterol levels. These may be checked every 5 years, or more frequently if you are over 53 years old. Skin check. Lung cancer screening. You may have this screening every year starting at age 18 if you have a 30-pack-year history of smoking and currently smoke or have quit within the past 15 years. Fecal occult blood test (FOBT) of the stool. You may have this test every year starting at age 35. Flexible sigmoidoscopy or colonoscopy. You may have a sigmoidoscopy every 5 years or a colonoscopy every 10 years starting at age 3. Prostate cancer screening. Recommendations will vary depending on your family history and other risks. Hepatitis C blood test. Hepatitis B blood test. Sexually transmitted disease (STD) testing. Diabetes screening.  This is done by checking your blood sugar (glucose) after you have not eaten for a while (fasting). You may have this done every 1-3 years. Abdominal aortic aneurysm (AAA) screening. You may need this if you are a current or former smoker. Osteoporosis. You may be screened starting at age 31 if you are at high risk. Talk with your health care provider about your test results,  treatment options, and if necessary, the need for more tests. Vaccines  Your health care provider may recommend certain vaccines, such as: Influenza vaccine. This is recommended every year. Tetanus, diphtheria, and acellular pertussis (Tdap, Td) vaccine. You may need a Td booster every 10 years. Zoster vaccine. You may need this after age 5. Pneumococcal 13-valent conjugate (PCV13) vaccine. One dose is recommended after age 1. Pneumococcal polysaccharide (PPSV23) vaccine. One dose is recommended after age 49. Talk to your health care provider about which screenings and vaccines you need and how often you need them. This information is not intended to replace advice given to you by your health care provider. Make sure you discuss any questions you have with your health care provider. Document Released: 02/20/2015 Document Revised: 10/14/2015 Document Reviewed: 11/25/2014 Elsevier Interactive Patient Education  2017 [place] Prevention in the Home Falls can cause injuries. They can happen to people of all ages. There are many things you can do to make your home safe and to help prevent falls. What can I do on the outside of my home? Regularly fix the edges of walkways and driveways and fix any cracks. Remove anything that might make you trip as you walk through a door, such as a raised step or threshold. Trim any bushes or trees on the path to your home. Use bright outdoor lighting. Clear any walking paths of anything that might make someone trip, such as rocks or tools. Regularly check to see if handrails are loose or broken. Make sure that both sides of any steps have handrails. Any raised decks and porches should have guardrails on the edges. Have any leaves, snow, or ice cleared regularly. Use sand or salt on walking paths during winter. Clean up any spills in your garage right away. This includes oil or grease spills. What can I do in the bathroom? Use night  lights. Install grab bars by the toilet and in the tub and shower. Do not use towel bars as grab bars. Use non-skid mats or decals in the tub or shower. If you need to sit down in the shower, use a plastic, non-slip stool. Keep the floor dry. Clean up any water that spills on the floor as soon as it happens. Remove soap buildup in the tub or shower regularly. Attach bath mats securely with double-sided non-slip rug tape. Do not have throw rugs and other things on the floor that can make you trip. What can I do in the bedroom? Use night lights. Make sure that you have a light by your bed that is easy to reach. Do not use any sheets or blankets that are too big for your bed. They should not hang down onto the floor. Have a firm chair that has side arms. You can use this for support while you get dressed. Do not have throw rugs and other things on the floor that can make you trip. What can I do in the kitchen? Clean up any spills right away. Avoid walking on wet floors. Keep items that you use a lot in easy-to-reach places. If  you need to reach something above you, use a strong step stool that has a grab bar. Keep electrical cords out of the way. Do not use floor polish or wax that makes floors slippery. If you must use wax, use non-skid floor wax. Do not have throw rugs and other things on the floor that can make you trip. What can I do with my stairs? Do not leave any items on the stairs. Make sure that there are handrails on both sides of the stairs and use them. Fix handrails that are broken or loose. Make sure that handrails are as long as the stairways. Check any carpeting to make sure that it is firmly attached to the stairs. Fix any carpet that is loose or worn. Avoid having throw rugs at the top or bottom of the stairs. If you do have throw rugs, attach them to the floor with carpet tape. Make sure that you have a light switch at the top of the stairs and the bottom of the stairs. If  you do not have them, ask someone to add them for you. What else can I do to help prevent falls? Wear shoes that: Do not have high heels. Have rubber bottoms. Are comfortable and fit you well. Are closed at the toe. Do not wear sandals. If you use a stepladder: Make sure that it is fully opened. Do not climb a closed stepladder. Make sure that both sides of the stepladder are locked into place. Ask someone to hold it for you, if possible. Clearly [place] and make sure that you can see: Any grab bars or handrails. First and last steps. Where the edge of each step is. Use tools that help you move around (mobility aids) if they are needed. These include: Canes. Walkers. Scooters. Crutches. Turn on the lights when you go into a dark area. Replace any light bulbs as soon as they burn out. Set up your furniture so you have a clear path. Avoid moving your furniture around. If any of your floors are uneven, fix them. If there are any pets around you, be aware of where they are. Review your medicines with your doctor. Some medicines can make you feel dizzy. This can increase your chance of falling. Ask your doctor what other things that you can do to help prevent falls. This information is not intended to replace advice given to you by your health care provider. Make sure you discuss any questions you have with your health care provider. Document Released: 11/20/2008 Document Revised: 07/02/2015 Document Reviewed: 02/28/2014 Elsevier Interactive Patient Education  2017 [employer].

## 2022-05-26 NOTE — Progress Notes (Signed)
Subjective:   [name] is a 72 y.o. male who presents for Medicare Annual/Subsequent preventive examination.  I connected with  [name] on 05/27/22 by a audio enabled telemedicine application and verified that I am speaking with the correct person using two identifiers.  Patient Location: Home  Provider Location: Office/Clinic  I discussed the limitations of evaluation and management by telemedicine. The patient expressed understanding and agreed to proceed.  Review of Systems     Cardiac Risk Factors include: advanced age (>15men, >76 women);hypertension;male gender;dyslipidemia;sedentary lifestyle     Objective:    Today's Vitals   05/27/22 1307  Weight: 176 lb (79.8 kg)  Height:  (1.753 m)   Body mass index is 25.99 kg/m.     05/27/2022    1:10 PM 05/11/2021    1:56 PM 01/17/2019    9:11 AM 12/11/2017   10:00 PM 12/06/2017    9:15 AM 09/05/2016   11:00 PM 09/01/2016   12:05 PM  Advanced Directives  Does Patient Have a Medical Advance Directive? No Yes Yes No No No No  Type of Special educational needs teacher of [place];Living will Healthcare Power of [place];Living will      Does patient want to make changes to medical advance directive?   No - Patient declined      Copy of Healthcare Power of Attorney in Chart?  No - copy requested       Would patient like information on creating a medical advance directive? No - Patient declined   No - Patient declined No - Patient declined No - Patient declined No - Patient declined    Current Medications (verified) Outpatient Encounter Medications as of 05/27/2022  Medication Sig   acetaminophen (TYLENOL) 500 MG tablet Take 500 mg by mouth every 6 (six) hours as needed for moderate pain.    albuterol (VENTOLIN HFA) 108 (90 Base) MCG/ACT inhaler TAKE 2 PUFFS BY MOUTH EVERY 4 HOURS AS NEEDED   apixaban (ELIQUIS) 5 MG TABS tablet TAKE 1 TABLET BY MOUTH TWICE A DAY   diltiazem (CARDIZEM) 60 MG tablet  TAKE 1 TABLET BY MOUTH TWICE A DAY   escitalopram (LEXAPRO) 10 MG tablet Take 1 tablet (10 mg total) by mouth daily.   ezetimibe (ZETIA) 10 MG tablet Take 1 tablet (10 mg total) by mouth daily.   fexofenadine (ALLEGRA) 180 MG tablet Take 180 mg by mouth daily.   fluticasone (FLONASE) 50 MCG/ACT nasal spray SPRAY 2 SPRAYS INTO EACH NOSTRIL EVERY DAY   irbesartan (AVAPRO) 75 MG tablet Take 1 tablet (75 mg total) by mouth daily.   Multiple Vitamin (MULTIVITAMIN) tablet Take 1 tablet by mouth daily.   OXYGEN Inhale into the lungs.   rosuvastatin (CRESTOR) 40 MG tablet TAKE 1 TABLET BY MOUTH EVERY DAY   Tiotropium Bromide-Olodaterol (STIOLTO RESPIMAT) 2.5-2.5 MCG/ACT AERS Inhale 2 puffs into the lungs daily.   vitamin C (ASCORBIC ACID) 500 MG tablet Take 500 mg by mouth daily.    No facility-administered encounter medications on file as of 05/27/2022.    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   Paroxysmal atrial fibrillation    White coat hypertension    Past Surgical History:  Procedure Laterality Date   ABDOMINAL AORTIC ENDOVASCULAR STENT GRAFT N/A 12/11/2017   Procedure: ABDOMINAL AORTIC ENDOVASCULAR STENT GRAFT;  Surgeon: Sherren Kerns, MD;  Location: [name] OR;  Service: Vascular;  Laterality: N/A;   ANKLE SURGERY Right 02/08/2007   AORTIC VALVE REPLACEMENT N/A 09/05/2016    Procedure: AORTIC VALVE REPLACEMENT (AVR) USING 23 MM MAGNA EASE PERICARDIAL TISSUE VALVE;  Surgeon: Delight Ovens, MD;  Location: [facility] OR;  Service: Open Heart Surgery;  Laterality: N/A;   APPENDECTOMY     CARDIAC CATHETERIZATION N/A 02/02/2016   Procedure: Left Heart Cath and Coronary Angiography;  Surgeon: Rinaldo Cloud, MD;  Location: [facility] INVASIVE CV LAB;  Service: Cardiovascular;  Laterality: N/A;   CARDIAC VALVE REPLACEMENT  09/05/2016   COLONOSCOPY  06/22/2010   [id number] rectal and right colon polyps removed remainder rectum and colon appared normal   COLONOSCOPY WITH PROPOFOL N/A 05/15/2014   Procedure: COLONOSCOPY WITH PROPOFOL;  Surgeon: Corbin Ade, MD;  Location: [facility] ORS;  Service: Endoscopy;  Laterality: N/A;  In cecum @ 0918, out @ 0937, withdrawal time 19 minutes   CORONARY ARTERY BYPASS GRAFT N/A 09/05/2016   Procedure: CORONARY ARTERY BYPASS GRAFTING (CABG) USING LIMA TO DISTAL LAD AND ENDOSCOPICALLY HARVESTED GREATER SAPHENOUS VEIN TO CIRC.;  Surgeon: Delight Ovens, MD;  Location: [facility] OR;  Service: Open Heart Surgery;  Laterality: N/A;   ENDOVEIN HARVEST OF GREATER SAPHENOUS VEIN Right 09/05/2016   Procedure: ENDOVEIN HARVEST OF GREATER SAPHENOUS VEIN;  Surgeon: Delight Ovens, MD;  Location: [facility] OR;  Service: Open Heart Surgery;  Laterality: Right;   FRACTURE SURGERY  01/2008   Ankle   POLYPECTOMY N/A 05/15/2014   Procedure: POLYPECTOMY;  Surgeon: Corbin Ade, MD;  Location: [facility];  Service: Endoscopy;  Laterality: N/A;   RIGHT/LEFT HEART CATH AND CORONARY ANGIOGRAPHY N/A 08/29/2016   Procedure: Right/Left Heart Cath and Coronary Angiography;  Surgeon: Tonny Bollman, MD;  Location[facility] INVASIVE CV LAB;  Service: Cardiovascular;  Laterality: N/A;   TEE WITHOUT CARDIOVERSION N/A 09/05/2016   Procedure: TRANSESOPHAGEAL ECHOCARDIOGRAM (TEE);  Surgeon: Delight Ovens, MD;  Location: [facility] OR;  Service: Open Heart Surgery;  Laterality: N/A;   Family History   Problem Relation Age of Onset   COPD Mother    Cancer Father    Hypertension Sister    Arthritis Sister    Cancer Sister    AAA (abdominal aortic aneurysm) Brother    Cancer Daughter    Hypertension Daughter    Hypertension Daughter    Colon cancer Neg Hx    Liver disease Neg Hx    Social History   Socioeconomic History   Marital status: Married    Spouse name: Jola Babinski   Number of children: Not on file   Years of education: 12   Highest education level: Not on file  Occupational History   Not on file  Tobacco Use   Smoking status: Former    Packs/day: 1.50    Years: 40.00    Additional pack years: 0.00    Total pack years:  60.00    Types: Cigarettes    Quit date: 07/20/2002    Years since quitting: 19.8    Passive exposure: Never   Smokeless tobacco: Never  Vaping Use   Vaping Use: Never used  Substance and Sexual Activity   Alcohol use: Not Currently    Comment: per pt   Drug use: No   Sexual activity: Not Currently    Birth control/protection: Abstinence  Other Topics Concern   Not on file  Social History Narrative   [facility] (09/13/16):   Previously worked in a Tax inspector. Also worked Production designer, theatre/television/film. Currently has an outdoor cat and no indoor pets. No bird or mold exposure.   Social Determinants of Health   Financial Resource Strain: Low Risk  (05/27/2022)   Overall Financial Resource Strain (CARDIA)    Difficulty of Paying Living Expenses: Not hard at all  Food Insecurity: No Food Insecurity (05/27/2022)   Hunger Vital Sign    Worried About Running Out of Food in the Last Year: Never true    Ran Out of Food in the Last Year: Never true  Transportation Needs: No Transportation Needs (05/27/2022)   PRAPARE - Administrator, Civil Service (Medical): No    Lack of Transportation (Non-Medical): No  Physical Activity: Inactive (05/27/2022)   Exercise Vital Sign    Days of Exercise per Week: 0 days    Minutes of Exercise per  Session: 0 min  Stress: No Stress Concern Present (05/27/2022)   [employer] of Occupational Health - Occupational Stress Questionnaire    Feeling of Stress : Not at all  Social Connections: Socially Integrated (05/27/2022)   Social Connection and Isolation Panel [NHANES]    Frequency of Communication with Friends and Family: More than three times a week    Frequency of Social Gatherings with Friends and Family: More than three times a week    Attends Religious Services: More than 4 times per year    Active Member of [employer] or Organizations: Yes    Attends Engineer, structural: More than 4 times per year    Marital Status: Married    Tobacco Counseling Counseling given: Not Answered   Clinical Intake:  Pre-visit preparation completed: Yes  Pain : No/denies pain  Diabetes: No  How often do you need to have someone help you when you read instructions, pamphlets, or other written materials from your doctor or pharmacy?: 2 - Rarely  Diabetic?No   Interpreter Needed?: No  Information entered by :: Kandis Fantasia LPN   Activities of Daily Living    05/27/2022    1:10 PM 05/24/2022    9:28 PM  In your present state of health, do you have any difficulty performing the following activities:  Hearing? 0 0  Vision? 0 0  Difficulty concentrating or making decisions? 0 0  Walking or climbing stairs? 1 1  Dressing or bathing? 0 0  Doing errands, shopping? 0 0  Preparing Food and eating ? N N  Using the Toilet? N N  In the past six months, have you accidently leaked urine? N N  Do you have problems with loss of bowel control? N N  Managing your Medications? N N  Managing your Finances? N N  Housekeeping or managing your Housekeeping? N N    Patient Care Team: [name], MD as PCP - General (Family Medicine) Jonelle Sidle, MD as PCP - Cardiology (Cardiology) Jena Gauss Gerrit Friends, MD as Consulting Physician (  Gastroenterology)  Indicate any recent Medical  Services you may have received from other than [facility] providers in the past year (date may be approximate).     Assessment:   This is a routine wellness examination for Riyan.  Hearing/Vision screen Hearing Screening - Comments:: Denies hearing difficulties   Vision Screening - Comments:: Wears rx glasses - up to date with routine eye exams with [facility]    Dietary issues and exercise activities discussed: Current Exercise Habits: The patient does not participate in regular exercise at present   Goals Addressed   None    Depression Screen    05/27/2022    1:09 PM 12/23/2021   10:32 AM 05/11/2021    1:51 PM 10/27/2020    8:41 AM 05/04/2020   10:46 AM 03/19/2020   10:24 AM 11/04/2019    8:53 AM  PHQ 2/9 Scores  PHQ - 2 Score 0 1 0 0 0 0 0  PHQ- 9 Score  2         Fall Risk    05/27/2022    1:07 PM 05/24/2022    9:28 PM 12/23/2021   10:32 AM 05/11/2021    1:57 PM 05/08/2021    5:37 PM  Fall Risk   Falls in the past year? 0 0 0 0 0  Number falls in past yr: 0 0 0 0 0  Injury with Fall? 0 0 0 0 0  Risk for fall due to : No Fall Risks  No Fall Risks No Fall Risks   Follow up Falls prevention discussed;Education provided;Falls evaluation completed  Falls evaluation completed Falls prevention discussed     FALL RISK PREVENTION PERTAINING TO THE HOME:  Any stairs in or around the home? No  If so, are there any without handrails? No  Home free of loose throw rugs in walkways, pet beds, electrical cords, etc? Yes  Adequate lighting in your home to reduce risk of falls? Yes   ASSISTIVE DEVICES UTILIZED TO PREVENT FALLS:  Life alert? No  Use of a cane, walker or w/c? No  Grab bars in the bathroom? Yes  Shower chair or bench in shower? No  Elevated toilet seat or a handicapped toilet? Yes   TIMED UP AND GO:  Was the test performed? No . Telephonic visit   Cognitive Function:        05/27/2022    1:10 PM 05/11/2021    1:59 PM  6CIT Screen  What Year? 0  points 0 points  What month? 0 points 0 points  What time? 0 points 0 points  Count back from 20 0 points 0 points  Months in reverse 0 points 0 points  Repeat phrase 0 points 0 points  Total Score 0 points 0 points    Immunizations Immunization History  Administered Date(s) Administered   Fluad Quad(high Dose 65+) 10/27/2020, 10/27/2021   Influenza, High Dose Seasonal PF 11/05/2018   Influenza,inj,Quad PF,6+ Mos 12/01/2014, 12/02/2016, 12/04/2017   Influenza-Unspecified 08/08/2011, 10/30/2012, 10/29/2013, 10/10/2019   Moderna Sars-Covid-2 Vaccination 03/14/2019, 04/12/2019   PFIZER(Purple Top)SARS-COV-2 Vaccination 12/12/2019   Pfizer Covid-19 Vaccine Bivalent Booster 64yrs & up 11/10/2020   Pneumococcal Conjugate-13 12/03/2015   Pneumococcal Polysaccharide-23 09/25/2009, 06/12/2019   Respiratory Syncytial Virus Vaccine,Recomb Aduvanted(Arexvy) 11/22/2021   Tdap 09/08/2010   Unspecified SARS-COV-2 Vaccination 11/01/2021   Zoster Recombinat (Shingrix) 12/11/2018, 05/13/2019   Zoster, Live 10/28/2010, 11/20/2012    TDAP status: Due, Education has been provided regarding the importance of this vaccine. Advised  may receive this vaccine at local pharmacy or Health Dept. Aware to provide a copy of the vaccination record if obtained from local pharmacy or Health Dept. Verbalized acceptance and understanding.  Flu Vaccine status: Up to date  Pneumococcal vaccine status: Up to date  Covid-19 vaccine status: Information provided on how to obtain vaccines.   Qualifies for Shingles Vaccine? Yes   Zostavax completed No   Shingrix Completed?: Yes  Screening Tests Health Maintenance  Topic Date Due   DTaP/Tdap/Td (2 - Td or Tdap) 09/07/2020   COVID-19 Vaccine (6 - 2023-24 season) 12/27/2021   INFLUENZA VACCINE  09/08/2022   Medicare Annual Wellness (AWV)  05/27/2023   COLONOSCOPY (Pts 45-58yrs Insurance coverage will need to be confirmed)  05/14/2024   Pneumonia Vaccine 65+ Years  old  Completed   Hepatitis C Screening  Completed   Zoster Vaccines- Shingrix  Completed   HPV VACCINES  Aged Out    Health Maintenance  Health Maintenance Due  Topic Date Due   DTaP/Tdap/Td (2 - Td or Tdap) 09/07/2020   COVID-19 Vaccine (6 - 2023-24 season) 12/27/2021    Colorectal cancer screening: Type of screening: Colonoscopy. Completed 05/15/14. Repeat every 10 years  Lung Cancer Screening: (Low Dose CT Chest recommended if Age 66-80 years, 30 pack-year currently smoking OR have quit w/in 15years.) does not qualify.   Lung Cancer Screening Referral: n/a  Additional Screening:  Hepatitis C Screening: does qualify; Completed 06/01/15  Vision Screening: Recommended annual ophthalmology exams for early detection of glaucoma and other disorders of the eye. Is the patient up to date with their annual eye exam?  Yes  Who is the provider or what is the name of the office in which the patient attends annual eye exams? [facility]) If pt is not established with a provider, would they like to be referred to a provider to establish care? No .   Dental Screening: Recommended annual dental exams for proper oral hygiene  Community Resource Referral / Chronic Care Management: CRR required this visit?  No   CCM required this visit?  No      Plan:     I have personally reviewed and noted the following in the patient's chart:   Medical and social history Use of alcohol, tobacco or illicit drugs  Current medications and supplements including opioid prescriptions. Patient is not currently taking opioid prescriptions. Functional ability and status Nutritional status Physical activity Advanced directives List of other physicians Hospitalizations, surgeries, and ER visits in previous 12 months Vitals Screenings to include cognitive, depression, and falls Referrals and appointments  In addition, I have reviewed and discussed with patient certain preventive protocols,  quality metrics, and best practice recommendations. A written personalized care plan for preventive services as well as general preventive health recommendations were provided to patient.     Durwin Nora, [place]   1/61/0960   Due to this being a virtual visit, the after visit summary with patients personalized plan was offered to patient via mail or my-chart.  Patient would like to access on my-chart  Nurse Notes: No concerns

## 2022-05-27 ENCOUNTER — Ambulatory Visit (INDEPENDENT_AMBULATORY_CARE_PROVIDER_SITE_OTHER): Payer: Medicare Other

## 2022-05-27 VITALS — Ht 69.0 in | Wt 176.0 lb

## 2022-05-27 DIAGNOSIS — Z0000 Encounter for general adult medical examination without abnormal findings: Secondary | ICD-10-CM

## 2022-06-06 DIAGNOSIS — J449 Chronic obstructive pulmonary disease, unspecified: Secondary | ICD-10-CM | POA: Diagnosis not present

## 2022-06-06 DIAGNOSIS — R0902 Hypoxemia: Secondary | ICD-10-CM | POA: Diagnosis not present

## 2022-06-09 ENCOUNTER — Other Ambulatory Visit: Payer: Self-pay | Admitting: Cardiology

## 2022-06-09 DIAGNOSIS — I480 Paroxysmal atrial fibrillation: Secondary | ICD-10-CM

## 2022-06-09 DIAGNOSIS — Z952 Presence of prosthetic heart valve: Secondary | ICD-10-CM

## 2022-06-10 NOTE — Telephone Encounter (Signed)
Prescription refill request for Eliquis received. Indication: Afib  Last office visit: 03/17/22 Diona Browner)  Scr: 0.56 (10/12/21)  Age: 72 Weight: 79.8kg  Appropriate dose. Refill sent.

## 2022-06-15 DIAGNOSIS — Z79899 Other long term (current) drug therapy: Secondary | ICD-10-CM | POA: Diagnosis not present

## 2022-06-15 DIAGNOSIS — E785 Hyperlipidemia, unspecified: Secondary | ICD-10-CM | POA: Diagnosis not present

## 2022-06-16 LAB — LIPID PANEL
Chol/HDL Ratio: 2.4 ratio (ref 0.0–5.0)
Cholesterol, Total: 122 mg/dL (ref 100–199)
HDL: 50 mg/dL (ref >39)
LDL Chol Calc (NIH): 55 mg/dL (ref 0–99)
Triglycerides: 90 mg/dL (ref 0–149)
VLDL Cholesterol Cal: 17 mg/dL (ref 5–40)

## 2022-06-16 LAB — HEPATIC FUNCTION PANEL
ALT: 18 IU/L (ref 0–44)
AST: 22 IU/L (ref 0–40)
Albumin: 4.6 g/dL (ref 3.9–4.9)
Alkaline Phosphatase: 46 IU/L (ref 44–121)
Bilirubin Total: 0.5 mg/dL (ref 0.0–1.2)
Bilirubin, Direct: 0.18 mg/dL (ref 0.00–0.40)
Total Protein: 6.4 g/dL (ref 6.0–8.5)

## 2022-06-23 ENCOUNTER — Ambulatory Visit (INDEPENDENT_AMBULATORY_CARE_PROVIDER_SITE_OTHER): Payer: Medicare Other | Admitting: Family Medicine

## 2022-06-23 ENCOUNTER — Encounter: Payer: Self-pay | Admitting: Family Medicine

## 2022-06-23 VITALS — BP 125/70 | HR 65 | Temp 97.9°F | Ht 69.0 in | Wt 171.0 lb

## 2022-06-23 DIAGNOSIS — Z862 Personal history of diseases of the blood and blood-forming organs and certain disorders involving the immune mechanism: Secondary | ICD-10-CM | POA: Diagnosis not present

## 2022-06-23 DIAGNOSIS — I480 Paroxysmal atrial fibrillation: Secondary | ICD-10-CM

## 2022-06-23 DIAGNOSIS — I10 Essential (primary) hypertension: Secondary | ICD-10-CM

## 2022-06-23 DIAGNOSIS — J449 Chronic obstructive pulmonary disease, unspecified: Secondary | ICD-10-CM

## 2022-06-23 DIAGNOSIS — E785 Hyperlipidemia, unspecified: Secondary | ICD-10-CM

## 2022-06-23 DIAGNOSIS — Z79899 Other long term (current) drug therapy: Secondary | ICD-10-CM

## 2022-06-23 NOTE — Progress Notes (Signed)
   Subjective:    Patient ID: [name], male    DOB: 07/27/1950, 72 y.o.   MRN: [id number]  HPI Patient is here today for 6 month follow up on chronic medical conditions History of COPD Hyperlipidemia, unspecified hyperlipidemia type  Paroxysmal atrial fibrillation [facility])  Essential hypertension  COPD with hypoxia [facility]) Outpatient Encounter Medications as of 06/23/2022  Medication Sig Note   acetaminophen (TYLENOL) 500 MG tablet Take 500 mg by mouth every 6 (six) hours as needed for moderate pain.     albuterol (VENTOLIN HFA) 108 (90 Base) MCG/ACT inhaler TAKE 2 PUFFS BY MOUTH EVERY 4 HOURS AS NEEDED    diltiazem (CARDIZEM) 60 MG tablet TAKE 1 TABLET BY MOUTH TWICE A DAY    ELIQUIS 5 MG TABS tablet TAKE 1 TABLET BY MOUTH TWICE A DAY    escitalopram (LEXAPRO) 10 MG tablet Take 1 tablet (10 mg total) by mouth daily.    ezetimibe (ZETIA) 10 MG tablet Take 1 tablet (10 mg total) by mouth daily.    fexofenadine (ALLEGRA) 180 MG tablet Take 180 mg by mouth daily.    fluticasone (FLONASE) 50 MCG/ACT nasal spray SPRAY 2 SPRAYS INTO EACH NOSTRIL EVERY DAY    irbesartan (AVAPRO) 75 MG tablet Take 1 tablet (75 mg total) by mouth daily.    Multiple Vitamin (MULTIVITAMIN) tablet Take 1 tablet by mouth daily.    OXYGEN Inhale into the lungs. 11/30/2021: 2 LPM wears with walking and acitivity   rosuvastatin (CRESTOR) 40 MG tablet TAKE 1 TABLET BY MOUTH EVERY DAY    Tiotropium Bromide-Olodaterol (STIOLTO RESPIMAT) 2.5-2.5 MCG/ACT AERS Inhale 2 puffs into the lungs daily.    vitamin C (ASCORBIC ACID) 500 MG tablet Take 500 mg by mouth daily.     No facility-administered encounter medications on file as of 06/23/2022.   Medication list reviewed in detail  Review of Systems     Objective:   Physical Exam General-in no acute distress Eyes-no discharge Lungs-respiratory rate normal, CTA CV-no murmurs,RRR Extremities skin warm dry no edema Neuro grossly normal Behavior normal,  alert        Assessment & Plan:  1. Hyperlipidemia, unspecified hyperlipidemia type Healthy diet recent lab work looks good, LDL 55  2. Paroxysmal atrial fibrillation (HCC) On blood thinner does well no bleeding issues blood pressure doing good  3. Essential hypertension Blood pressure doing good  4. COPD with hypoxia (HCC) Uses his oxygen especially with walking is required see previous notes from pulmonary not smoking  5. High risk medication use Check CBC  6. History of thrombocytopenia Check CBC  Follow-up in the fall comprehensive check at that time

## 2022-06-23 NOTE — Patient Instructions (Addendum)
Tdap at pharmacy  RSV and Flu this fall  APlease do your blood work for your late follow-up visit a few days before that visit

## 2022-07-06 DIAGNOSIS — J449 Chronic obstructive pulmonary disease, unspecified: Secondary | ICD-10-CM | POA: Diagnosis not present

## 2022-07-06 DIAGNOSIS — R0902 Hypoxemia: Secondary | ICD-10-CM | POA: Diagnosis not present

## 2022-07-09 ENCOUNTER — Other Ambulatory Visit: Payer: Self-pay | Admitting: Family Medicine

## 2022-07-29 ENCOUNTER — Telehealth: Payer: Self-pay

## 2022-07-29 ENCOUNTER — Other Ambulatory Visit: Payer: Self-pay | Admitting: Family Medicine

## 2022-07-29 DIAGNOSIS — R030 Elevated blood-pressure reading, without diagnosis of hypertension: Secondary | ICD-10-CM | POA: Diagnosis not present

## 2022-07-29 DIAGNOSIS — U071 COVID-19: Secondary | ICD-10-CM | POA: Diagnosis not present

## 2022-07-29 MED ORDER — MOLNUPIRAVIR EUA 200MG CAPSULE: 4.0000 | ORAL_CAPSULE | Freq: Two times a day (BID) | ORAL | 0 refills | Status: AC

## 2022-07-29 NOTE — Telephone Encounter (Signed)
Spoke with patient and advised per Dr Adriana Simas that since the patient has a history of COPD and his symptoms seem to be worsening and the office has no appointments today he needs to be seen. Patient advised to go to urgent care to be checked out. Patient verbalized understanding.

## 2022-07-29 NOTE — Telephone Encounter (Signed)
Patient called and tested positive for COVID Thursday evening. Patient states he has some chest congestion, runny nose, nasal congestion, cough, aching and sore throat. Patient does have home oxygen therapy. Patient O2 sat are 90%-91% sitting and .95% with oxygen he states this is his normal. Patient would like to know could something be sent in for him?

## 2022-08-02 NOTE — Telephone Encounter (Signed)
Spoke with patient and advised per Dr Cook that since the patient has a history of COPD and his symptoms seem to be worsening and the office has no appointments today he needs to be seen. Patient advised to go to urgent care to be checked out. Patient verbalized understanding.  

## 2022-08-03 NOTE — Telephone Encounter (Signed)
error 

## 2022-08-06 DIAGNOSIS — R0902 Hypoxemia: Secondary | ICD-10-CM | POA: Diagnosis not present

## 2022-08-06 DIAGNOSIS — J449 Chronic obstructive pulmonary disease, unspecified: Secondary | ICD-10-CM | POA: Diagnosis not present

## 2022-08-13 ENCOUNTER — Other Ambulatory Visit: Payer: Self-pay | Admitting: Family Medicine

## 2022-09-05 DIAGNOSIS — J449 Chronic obstructive pulmonary disease, unspecified: Secondary | ICD-10-CM | POA: Diagnosis not present

## 2022-09-05 DIAGNOSIS — R0902 Hypoxemia: Secondary | ICD-10-CM | POA: Diagnosis not present

## 2022-09-23 ENCOUNTER — Other Ambulatory Visit: Payer: Self-pay | Admitting: Family Medicine

## 2022-10-06 ENCOUNTER — Ambulatory Visit: Payer: Medicare Other | Attending: Cardiology

## 2022-10-06 DIAGNOSIS — I25119 Atherosclerotic heart disease of native coronary artery with unspecified angina pectoris: Secondary | ICD-10-CM

## 2022-10-06 DIAGNOSIS — Z952 Presence of prosthetic heart valve: Secondary | ICD-10-CM

## 2022-10-07 LAB — ECHOCARDIOGRAM COMPLETE
AR max vel: 0.9 cm2
AV Area VTI: 1 cm2
AV Area mean vel: 1.05 cm2
AV Mean grad: 30 mmHg
AV Peak grad: 59 mmHg
Ao pk vel: 3.84 m/s
Area-P 1/2: 3.07 cm2
MV VTI: 2.33 cm2
S' Lateral: 3.1 cm
Single Plane A4C EF: 65.3 %

## 2022-10-10 NOTE — Progress Notes (Unsigned)
Cardiology Office Note  Date: 10/11/2022   ID: LACY COUNTESS, DOB May 21, 1950, MRN [id number]  History of Present Illness: [name] is a 72 y.o. male last seen in February.  He is here for a routine visit.  Reports no change in stamina, NYHA class II dyspnea with most activities, no angina, no palpitations or syncope.  I reviewed his medications.  He reports no spontaneous bleeding problems on Eliquis and otherwise continues on diltiazem, Avapro, Crestor, and Zetia.  Still using supplemental oxygen as needed.  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  Physical Exam: VS:  BP 118/68 (BP Location: Left Arm)   Pulse 67   Ht 5\' 9"  (1.753 m)   Wt 172 lb 6.4 oz (78.2 kg)   SpO2 96%   BMI 25.46 kg/m , BMI Body mass index is 25.46 kg/m.  Wt Readings from Last 3 Encounters:  10/11/22 172 lb 6.4 oz (78.2 kg)  06/23/22 171 lb (77.6 kg)  05/27/22 176 lb (79.8 kg)    General: Patient appears comfortable at rest. HEENT: Conjunctiva and lids normal. Neck: Supple, no elevated JVP or carotid bruits. Lungs: Clear to auscultation, nonlabored breathing at rest. Cardiac: Regular rate and rhythm, no S3, 2/6 systolic murmur. Extremities: No pitting edema.  ECG:  An ECG dated 03/17/2022 was personally reviewed today and demonstrated:  Sinus rhythm with possible old high lateral infarct pattern, right atrial enlargement, nonspecific T wave changes.  Labwork: 10/12/2021: BUN 9; Creatinine, Ser 0.56; Hemoglobin 13.5; Platelets 192; Potassium 4.5; Sodium 137 06/15/2022: ALT 18; AST 22     Component Value Date/Time   CHOL 122 06/15/2022 0841   TRIG 90 06/15/2022 0841   HDL 50 06/15/2022 0841   CHOLHDL 2.4 06/15/2022 0841   CHOLHDL 3.1 02/19/2018 0838   VLDL 45 (H) 05/16/2016 0856   LDLCALC 55 06/15/2022 0841   LDLCALC 78 02/19/2018 0838   Other Studies Reviewed Today:  Echocardiogram  10/06/2022:  1. Left ventricular ejection fraction, by estimation, is 60 to 65%. The  left ventricle has normal function. The left ventricle has no regional  wall motion abnormalities. Left ventricular diastolic parameters are  indeterminate.   2. Right ventricular systolic function is normal. The right ventricular  size is normal. Tricuspid regurgitation signal is inadequate for assessing  PA pressure.   3. The mitral valve is normal in structure. No evidence of mitral valve  regurgitation. No evidence of mitral stenosis.   4. The aortic valve has been repaired/replaced. Aortic valve  regurgitation is not visualized. Mild aortic valve stenosis. There is a 23  mm Magna Ease pericardial valve present in the aortic position. Prosthetic  aortic valve leaflets has mild restriction   in the leaflet mobility with adequate leaflet excursion (in current and  prior Echo). Aortic valve mean gradient measures 30.0 mmHg (compared to 33  mm Hg in 02/2022)   5. The inferior vena cava is normal in size with greater than 50%  respiratory variability, suggesting right atrial pressure of 3 mmHg.   Assessment and Plan:  1.  CAD status post LIMA to LAD and SVG to circumflex in July 2018.  LVEF 60 to 65% by recent echocardiogram.  He reports no active angina.  Not on aspirin given use of Eliquis.  Continue Crestor and Zetia.  2.  History of aortic stenosis status post 23 mm Magna Ease pericardial AVR in July 2018.  He has evidence of prosthetic  stenosis.  Recent follow-up echocardiogram shows stable mean AV gradient of 30 mmHg and dimensionless index of 0.32 consistent with moderate stenosis.  No progressive exertional symptomatology at this time, plan to continue observation.  Have discussed potential for valve in valve TAVR down the road if the situation progresses.  3.  Essential hypertension.  Blood pressure well-controlled today.  Continue Avapro.  4.  Mixed hyperlipidemia.  LDL 55 in May.  He remains on  Crestor and Zetia.  5.  Abdominal aortic aneurysm status post EVAR in November 2019.  Continues to follow with [facility].  6.  Paroxysmal atrial fibrillation with CHA2DS2-VASc score of 3.  Continue Cardizem and Eliquis.  He reports no spontaneous bleeding problems.  Disposition:  Follow up  6 months.  Signed, Jonelle Sidle, M.D., F.A.C.C. [facility] at [facility]

## 2022-10-11 ENCOUNTER — Ambulatory Visit: Payer: Medicare Other | Attending: Cardiology | Admitting: Cardiology

## 2022-10-11 ENCOUNTER — Encounter: Payer: Self-pay | Admitting: Cardiology

## 2022-10-11 VITALS — BP 118/68 | HR 67 | Ht 69.0 in | Wt 172.4 lb

## 2022-10-11 DIAGNOSIS — I25119 Atherosclerotic heart disease of native coronary artery with unspecified angina pectoris: Secondary | ICD-10-CM | POA: Diagnosis not present

## 2022-10-11 DIAGNOSIS — I10 Essential (primary) hypertension: Secondary | ICD-10-CM

## 2022-10-11 DIAGNOSIS — Z953 Presence of xenogenic heart valve: Secondary | ICD-10-CM | POA: Diagnosis not present

## 2022-10-11 DIAGNOSIS — I480 Paroxysmal atrial fibrillation: Secondary | ICD-10-CM

## 2022-10-11 DIAGNOSIS — E782 Mixed hyperlipidemia: Secondary | ICD-10-CM | POA: Diagnosis not present

## 2022-10-11 NOTE — Patient Instructions (Signed)

## 2022-11-09 ENCOUNTER — Other Ambulatory Visit: Payer: Self-pay | Admitting: Cardiology

## 2022-11-09 ENCOUNTER — Other Ambulatory Visit: Payer: Self-pay | Admitting: Family Medicine

## 2022-12-10 ENCOUNTER — Encounter: Payer: Self-pay | Admitting: Family Medicine

## 2022-12-10 DIAGNOSIS — Z125 Encounter for screening for malignant neoplasm of prostate: Secondary | ICD-10-CM

## 2022-12-10 DIAGNOSIS — I10 Essential (primary) hypertension: Secondary | ICD-10-CM

## 2022-12-10 DIAGNOSIS — E785 Hyperlipidemia, unspecified: Secondary | ICD-10-CM

## 2022-12-13 NOTE — Telephone Encounter (Signed)
Nurses-please order lipid, liver, metabolic 7, PSA, urine ACR  Diagnosis hyperlipidemia, hypertension, screening prostate cancer, urine ACR reason is hypertension  Please make sure patient knows that when he goes to do his blood work he should be well-hydrated when he goes as well as be prepared to give a urine specimen to check for protein in the urine thank you

## 2022-12-22 DIAGNOSIS — I10 Essential (primary) hypertension: Secondary | ICD-10-CM | POA: Diagnosis not present

## 2022-12-22 DIAGNOSIS — E785 Hyperlipidemia, unspecified: Secondary | ICD-10-CM | POA: Diagnosis not present

## 2022-12-23 LAB — LIPID PANEL
Chol/HDL Ratio: 2.2 ratio (ref 0.0–5.0)
Cholesterol, Total: 163 mg/dL (ref 100–199)
HDL: 74 mg/dL (ref >39)
LDL Chol Calc (NIH): 74 mg/dL (ref 0–99)
Triglycerides: 81 mg/dL (ref 0–149)
VLDL Cholesterol Cal: 15 mg/dL (ref 5–40)

## 2022-12-23 LAB — BASIC METABOLIC PANEL
BUN/Creatinine Ratio: 16 (ref 10–24)
BUN: 12 mg/dL (ref 8–27)
CO2: 27 mmol/L (ref 20–29)
Calcium: 9.7 mg/dL (ref 8.6–10.2)
Chloride: 95 mmol/L — ABNORMAL LOW (ref 96–106)
Creatinine, Ser: 0.73 mg/dL — ABNORMAL LOW (ref 0.76–1.27)
Glucose: 97 mg/dL (ref 70–99)
Potassium: 5 mmol/L (ref 3.5–5.2)
Sodium: 138 mmol/L (ref 134–144)
eGFR: 98 mL/min/{1.73_m2} (ref >59)

## 2022-12-23 LAB — MICROALBUMIN / CREATININE URINE RATIO
Creatinine, Urine: 101.7 mg/dL
Microalb/Creat Ratio: 10 mg/g{creat} (ref 0–29)
Microalbumin, Urine: 10.6 ug/mL

## 2022-12-23 LAB — HEPATIC FUNCTION PANEL
ALT: 33 [IU]/L (ref 0–44)
AST: 44 [IU]/L — ABNORMAL HIGH (ref 0–40)
Albumin: 4.7 g/dL (ref 3.9–4.9)
Alkaline Phosphatase: 53 [IU]/L (ref 44–121)
Bilirubin Total: 0.6 mg/dL (ref 0.0–1.2)
Bilirubin, Direct: 0.25 mg/dL (ref 0.00–0.40)
Total Protein: 7.1 g/dL (ref 6.0–8.5)

## 2022-12-23 LAB — PSA: Prostate Specific Ag, Serum: 3.9 ng/mL (ref 0.0–4.0)

## 2022-12-27 ENCOUNTER — Ambulatory Visit: Payer: Medicare Other | Admitting: Family Medicine

## 2022-12-30 ENCOUNTER — Encounter: Payer: Self-pay | Admitting: Family Medicine

## 2022-12-30 ENCOUNTER — Ambulatory Visit (INDEPENDENT_AMBULATORY_CARE_PROVIDER_SITE_OTHER): Payer: Medicare Other | Admitting: Family Medicine

## 2022-12-30 VITALS — BP 114/58 | HR 82 | Temp 97.5°F | Ht 69.0 in | Wt 172.2 lb

## 2022-12-30 DIAGNOSIS — N401 Enlarged prostate with lower urinary tract symptoms: Secondary | ICD-10-CM

## 2022-12-30 DIAGNOSIS — I480 Paroxysmal atrial fibrillation: Secondary | ICD-10-CM

## 2022-12-30 DIAGNOSIS — L219 Seborrheic dermatitis, unspecified: Secondary | ICD-10-CM

## 2022-12-30 DIAGNOSIS — R7401 Elevation of levels of liver transaminase levels: Secondary | ICD-10-CM | POA: Diagnosis not present

## 2022-12-30 DIAGNOSIS — I10 Essential (primary) hypertension: Secondary | ICD-10-CM

## 2022-12-30 DIAGNOSIS — E785 Hyperlipidemia, unspecified: Secondary | ICD-10-CM

## 2022-12-30 DIAGNOSIS — Z125 Encounter for screening for malignant neoplasm of prostate: Secondary | ICD-10-CM

## 2022-12-30 DIAGNOSIS — R350 Frequency of micturition: Secondary | ICD-10-CM

## 2022-12-30 MED ORDER — FLUTICASONE PROPIONATE 50 MCG/ACT NA SUSP: 2.0000 | Freq: Every day | NASAL | 5 refills | Status: DC

## 2022-12-30 MED ORDER — ESCITALOPRAM OXALATE 10 MG PO TABS: 10.0000 mg | ORAL_TABLET | Freq: Every day | ORAL | 1 refills | Status: DC

## 2022-12-30 MED ORDER — ALBUTEROL SULFATE HFA 108 (90 BASE) MCG/ACT IN AERS: INHALATION_SPRAY | RESPIRATORY_TRACT | 12 refills | Status: DC

## 2022-12-30 MED ORDER — ROSUVASTATIN CALCIUM 40 MG PO TABS: ORAL_TABLET | ORAL | 1 refills | Status: DC

## 2022-12-30 NOTE — Progress Notes (Addendum)
 Subjective:    Patient ID: [name], male    DOB: March 15, 1950, 72 y.o.   MRN: [id number]  Discussed the use of AI scribe software for clinical note transcription with the patient, who gave verbal consent to proceed.  History of Present Illness   The patient, with a history of cardiovascular disease, has been under regular follow-up and recently underwent an echocardiogram after a gap of five years. The ejection fraction was reported to be 60-65%, indicating a strong heart function. However, he has noticed easy bruising, which has not been severe in the last couple of months.  The patient also has a history of respiratory issues and uses oxygen  intermittently, particularly when he anticipates exertion. At rest, his oxygen  saturation remains around 91-92% without supplemental oxygen . He has a home monitor to keep track of his oxygen  levels.  In terms of overall health, the patient reports feeling well. He had a mild case of COVID-19 in June, which did not significantly affect his health. He has received his flu shot, pneumonia shot, RSV, and COVID-19 vaccine.  The patient's recent blood work showed a slight increase in LDL cholesterol and a minimal elevation in AST liver enzyme. The patient's creatinine levels and GFR indicate good kidney function.  The patient also reported a minor skin condition in the inner aspect of his right ear, described as white, crusty skin that reappears daily after cleaning. This has been identified as seborrheic dermatitis.         Review of Systems     Objective:    Physical Exam   VITALS: BP- 114/58 HEENT: Seborrheic dermatitis on the inner aspect of the right ear. CHEST: Lungs clear to auscultation, no crackles or wheezing. CARDIOVASCULAR: Early systolic murmur noted. Heart rhythm regular. EXTREMITIES: No edema. SKIN: Warm, dry.           Assessment & Plan:  Assessment and Plan    Atrial Fibrillation Stable on Eliquis  with no reported  bleeding issues. Bruising noted but not severe. Patient educated on signs of internal bleeding. -Continue Eliquis  as prescribed.  Chronic Hypoxemia and COPD Oxygen  saturation in low 90s at rest without supplemental oxygen . Uses supplemental oxygen  with activity and reports improved tolerance. -Continue current oxygen  regimen.  Hyperlipidemia LDL increased from 55 to 74 despite adherence to rosuvastatin  40mg  daily. Patient reports good dietary habits. -Continue rosuvastatin  40mg  daily. -Recheck lipid panel in 6 months.  Liver Enzymes Minimal elevation in AST (44). Patient reports moderate alcohol consumption. -Advise moderation in alcohol consumption. -Recheck liver enzymes in 6 months.  Seborrheic Dermatitis Noted in right ear. No discomfort reported. -Apply over-the-counter hydrocortisone cream twice daily as needed.  General Health Maintenance Up to date on immunizations. Annual vascular follow-up scheduled for February. -Continue current immunization schedule. -Attend vascular follow-up as scheduled.     1. Hyperlipidemia, unspecified hyperlipidemia type The goal is to keep LDL below 70 we will work hard toward that may need to add Zetia  on next lab work pending - Lipid Panel  2. Essential hypertension Blood pressure good control continue current measures if it starts dropping too low may need to scale back on some of the medication - Basic Metabolic Panel  3. Paroxysmal atrial fibrillation (HCC) A-fib under good control taking blood thinner no bleeding issues  4. Seborrheic dermatitis May use hydrocortisone 1% twice daily this is in the right inner ear aspect  5. Elevated transaminase level More than likely fatty liver but at same time avoid alcohol or use only  occasionally healthy diet recheck labs if it becomes a trend will need to initiate additional stuff and tests - Hepatic Function Panel  6. Screening for prostate cancer PSA went up slightly so we will recheck  it again in 6 months - PSA  7. Benign prostatic hyperplasia with urinary frequency PSA went up to 3.9 recheck 6 months - PSA   It is medically necessary for him to have O2 because he has severe COPD followed by pulmonologist and has chronic hypoxia which requires oxygen 

## 2023-03-07 ENCOUNTER — Other Ambulatory Visit: Payer: Self-pay

## 2023-03-07 DIAGNOSIS — I7143 Infrarenal abdominal aortic aneurysm, without rupture: Secondary | ICD-10-CM

## 2023-03-14 ENCOUNTER — Ambulatory Visit: Payer: Medicare Other

## 2023-03-14 ENCOUNTER — Other Ambulatory Visit: Payer: Medicare Other

## 2023-03-21 ENCOUNTER — Ambulatory Visit: Payer: Medicare Other

## 2023-03-21 ENCOUNTER — Other Ambulatory Visit: Payer: Medicare Other

## 2023-03-23 ENCOUNTER — Other Ambulatory Visit: Payer: Self-pay | Admitting: Internal Medicine

## 2023-03-23 ENCOUNTER — Other Ambulatory Visit: Payer: Self-pay | Admitting: Family Medicine

## 2023-04-08 NOTE — Progress Notes (Deleted)
 [name], male    DOB: [birth year],    MRN: [id number]   Brief patient profile:  89 yowm MM/quit smoking 2004 s much resp cc  referred to pulmonary clinic in [place]  12/29/2020 by Dr  Gerda Diss for copd GOLD 3 dx 2018 / newly 02 rx x around 10/2020  Pt is s/p AVR 08/2016   History of Present Illness  12/29/2020  Pulmonary/ 1st office eval/ [name] / [name] Office / [name]  Chief Complaint  Patient presents with   Consult    COPD for 15 years. Diagnosed by Dr. Lilyan Punt.   2LO2 pulse portable oxygen. 2LO2 cont. At home and while sleeping. Has been on O2 for 2 months.   Dyspnea:  2lpm and 30 yards to street slt incline to house and back but does not check 02  Cough: none Sleep: 2lpm hs /flat  SABA use: rarely = once a week hfa Rec Stop trelegy and lisinopril  Benicar (olemasartan) 20 mg one daily  Plan A = Automatic = Always=  breztri Take 2 puffs first thing in am and then another 2 puffs about 12 hours later.  Work on inhaler technique:   Plan B = Backup (to supplement plan A, not to replace it) Only use your albuterol inhaler as a rescue medication Make sure you check your oxygen saturation  AT  your highest level of activity (not after you stop)  office visit in 6 weeks, call sooner if needed with all medications /inhalers/ solutions in hand  02/15/2021  f/u ov/[name] office/[name] re: COPD GOLD 3/ ? acei case  maint on breztri  Chief Complaint  Patient presents with   Follow-up    Feels breathing has improved "a little bit but not a lot"  2LO2 cont. And pulse while out.   88% 2LO2 pulse form lobby to room.  92& 2LO2 pulse after sitting for approx. 5 min.   Dyspnea:  mb and back is without 02 /  same for grocery store too  Cough: worse assoc very hoarse x 3 days p back on ACEi x 3 weeks  Sleeping: flat bed on 2lpm  SABA use: maybe once  02: 2lpm HS and daytime  Covid status: vax x 5  Lung cancer screening: > 15 y since  Quit   Rec Plan A = Automatic =  Always=    Stiolto 2 puffs each am 1st thing  Plan B = Backup (to supplement plan A, not to replace it) Only use your albuterol inhaler as a rescue medication Stop lisinopril and take ibesartan 75 mg one daily  (ok to break in half if too strong)  Make sure you check your oxygen saturation  AT  your highest level of activity (not after you stop)   to be sure it stays over 90%  Please schedule a follow up office visit in 6 weeks, call sooner if needed     04/08/2022  6 m f/u ov/[name] office/[name] re: GOLD 3 COPD  maint on stiolto   Chief Complaint  Patient presents with   Follow-up    Breathing a little better since last ov  Dyspnea:  walked a quarter mile night prior to OV  on 2lpm POC  Cough: none Sleeping: no resp cc lying flat 2 pillows  SABA use: once or twice a week  02: 2lpm hs and prn POC none needed at rest Covid status: vax max  Lung cancer screening: > 15 y out so not eligible Rec No  change in medications  Ok to try albuterol 15 min before an activity (on alternating days)  that you know would usually make you short of breath Make sure you check your oxygen saturation  AT  your highest level of activity (not after you stop)   to be sure it stays over 90%  Please schedule a follow up visit in 12 months but call sooner if needed     04/10/2023  f/u ov/[name] office/[name] re: *** maint on ***  No chief complaint on file.   Dyspnea:  *** Cough: *** Sleeping: ***   resp cc  SABA use: *** 02: ***  Lung cancer screening: ***   No obvious day to day or daytime variability or assoc excess/ purulent sputum or mucus plugs or hemoptysis or cp or chest tightness, subjective wheeze or overt sinus or hb symptoms.    Also denies any obvious fluctuation of symptoms with weather or environmental changes or other aggravating or alleviating factors except as outlined above   No unusual exposure hx or h/o childhood pna/ asthma or knowledge of premature birth.  Current  Allergies, Complete Past Medical History, Past Surgical History, Family History, and Social History were reviewed in [employer] record.  ROS  The following are not active complaints unless bolded Hoarseness, sore throat, dysphagia, dental problems, itching, sneezing,  nasal congestion or discharge of excess mucus or purulent secretions, ear ache,   fever, chills, sweats, unintended wt loss or wt gain, classically pleuritic or exertional cp,  orthopnea pnd or arm/hand swelling  or leg swelling, presyncope, palpitations, abdominal pain, anorexia, nausea, vomiting, diarrhea  or change in bowel habits or change in bladder habits, change in stools or change in urine, dysuria, hematuria,  rash, arthralgias, visual complaints, headache, numbness, weakness or ataxia or problems with walking or coordination,  change in mood or  memory.        No outpatient medications have been marked as taking for the 04/10/23 encounter (Appointment) with Daniel Cowden, MD.                    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   Hyperlipidemia    Myocardial infarction [facility])    Postoperative atrial fibrillation (HCC) 2018   White coat hypertension        Objective:    Wts  04/10/2023          ***  04/08/2022         176  10/04/2021       171  03/29/2021       180   02/15/21 181 lb 1.9 oz (82.2 kg)  02/09/21 183 lb 6.4 oz (83.2 kg)  01/21/21 179 lb 8 oz (81.4 kg)   Vital signs reviewed  04/10/2023  - Note at rest 02 sats  ***% on ***   General appearance:    ***     Mild bar***            Assessment

## 2023-04-10 ENCOUNTER — Ambulatory Visit: Payer: Medicare Other | Admitting: Internal Medicine

## 2023-04-10 ENCOUNTER — Telehealth: Payer: Self-pay | Admitting: Internal Medicine

## 2023-04-10 NOTE — Telephone Encounter (Signed)
 Spoke with patient regarding new appointment date and time----05/09/23 at 10:00 am---04/10/23 appt cancelled due to office  water damage

## 2023-04-19 ENCOUNTER — Encounter: Payer: Self-pay | Admitting: Cardiology

## 2023-04-19 ENCOUNTER — Ambulatory Visit: Payer: Medicare Other | Attending: Cardiology | Admitting: Cardiology

## 2023-04-19 VITALS — BP 138/82 | HR 80 | Ht 69.0 in | Wt 172.8 lb

## 2023-04-19 DIAGNOSIS — Z953 Presence of xenogenic heart valve: Secondary | ICD-10-CM

## 2023-04-19 DIAGNOSIS — I350 Nonrheumatic aortic (valve) stenosis: Secondary | ICD-10-CM

## 2023-04-19 DIAGNOSIS — T82857D Stenosis of cardiac prosthetic devices, implants and grafts, subsequent encounter: Secondary | ICD-10-CM

## 2023-04-19 DIAGNOSIS — I10 Essential (primary) hypertension: Secondary | ICD-10-CM | POA: Diagnosis not present

## 2023-04-19 DIAGNOSIS — I480 Paroxysmal atrial fibrillation: Secondary | ICD-10-CM | POA: Diagnosis not present

## 2023-04-19 DIAGNOSIS — E782 Mixed hyperlipidemia: Secondary | ICD-10-CM

## 2023-04-19 DIAGNOSIS — I25119 Atherosclerotic heart disease of native coronary artery with unspecified angina pectoris: Secondary | ICD-10-CM | POA: Diagnosis not present

## 2023-04-19 NOTE — Progress Notes (Signed)
 Cardiology Office Note  Date: 04/19/2023   ID: MACIEJ SCHWEITZER, DOB 1950-10-06, MRN [id number]  History of Present Illness: [name] is a 73 y.o. male last seen in September 2024.  He is here for a routine visit.  Reports no interval change in stamina, no exertional chest pain or breathlessness beyond NYHA class II.  No palpitations or syncope.  We went over his medications today.  He does not report any concerns.  No spontaneous bleeding problems on Eliquis.  He checks blood pressure with cuff at home reporting systolics in the 120-130 range.  I reviewed his ECG today which shows sinus rhythm with right atrial enlargement, nonspecific ST-T changes.  We discussed his echocardiogram from August of last year.  Physical Exam: VS:  BP 138/82   Pulse 80   Ht 5\' 9"  (1.753 m)   Wt 172 lb 12.8 oz (78.4 kg)   SpO2 95%   BMI 25.52 kg/m , BMI Body mass index is 25.52 kg/m.  Wt Readings from Last 3 Encounters:  04/19/23 172 lb 12.8 oz (78.4 kg)  12/30/22 172 lb 3.2 oz (78.1 kg)  10/11/22 172 lb 6.4 oz (78.2 kg)    General: Patient appears comfortable at rest. HEENT: Conjunctiva and lids normal. Neck: Supple, no elevated JVP or carotid bruits. Lungs: Clear to auscultation, nonlabored breathing at rest. Cardiac: Regular rate and rhythm, no S3, 2/6 systolic murmur. Extremities: No pitting edema.  ECG:  An ECG dated 03/17/2022 was personally reviewed today and demonstrated:  Sinus rhythm with possible old high lateral infarct pattern, right atrial enlargement, nonspecific T wave changes.  Labwork: 12/22/2022: ALT 33; AST 44; BUN 12; Creatinine, Ser 0.73; Potassium 5.0; Sodium 138     Component Value Date/Time   CHOL 163 12/22/2022 0833   TRIG 81 12/22/2022 0833   HDL 74 12/22/2022 0833   CHOLHDL 2.2 12/22/2022 0833   CHOLHDL 3.1 02/19/2018 0838   VLDL 45 (H) 05/16/2016 0856   LDLCALC 74 12/22/2022 0833   LDLCALC 78 02/19/2018 0838   Other Studies Reviewed  Today:  No interval cardiac testing for review today.  Assessment and Plan:  1.  CAD status post LIMA to LAD and SVG to circumflex in July 2018.  LVEF 60 to 65% by echocardiogram in August 2024.  ECG reviewed and stable.  He does not report any angina.  Not on aspirin given use of Eliquis.  Continue Crestor 40 mg daily and Zetia 10 mg daily.   2.  History of aortic stenosis status post 23 mm Magna Ease pericardial AVR in July 2018.  He has evidence of prosthetic stenosis.  Echocardiogram in August 2024 showed stable mean AV gradient of 30 mmHg and dimensionless index of 0.32 consistent with moderate stenosis.  Remains symptomatically stable, NYHA class II symptoms.  Update echocardiogram in 6 months with clinical visit.   3.  Primary hypertension.  Continue with present regimen including Avapro 75 mg daily   4.  Mixed hyperlipidemia.  LDL 74 in November 2024.  Continue Crestor 40 mg daily and Zetia 10 mg daily.   5.  Abdominal aortic aneurysm status post EVAR in November 2019.  Continues to follow with [facility].  He has follow-up scheduled later in the year.   6.  Paroxysmal atrial fibrillation with CHA2DS2-VASc score of 3.  No interval palpitations and in sinus rhythm by ECG today.  Continue Eliquis 5 mg twice daily for stroke prophylaxis.  Also on Cardizem 60 mg twice daily.  Disposition:  Follow up  6 months.  Signed, Jonelle Sidle, M.D., F.A.C.C. [facility] at [facility]

## 2023-04-19 NOTE — Patient Instructions (Signed)
 Medication Instructions:  Your physician recommends that you continue on your current medications as directed. Please refer to the Current Medication list given to you today.   Labwork: None today  Testing/Procedures: Your physician has requested that you have an echocardiogram in 6 months. Echocardiography is a painless test that uses sound waves to create images of your heart. It provides your doctor with information about the size and shape of your heart and how well your heart's chambers and valves are working. This procedure takes approximately one hour. There are no restrictions for this procedure. Please do NOT wear cologne, perfume, aftershave, or lotions (deodorant is allowed). Please arrive 15 minutes prior to your appointment time.  Please note: We ask at that you not bring children with you during ultrasound (echo/ vascular) testing. Due to room size and safety concerns, children are not allowed in the ultrasound rooms during exams. Our front office staff cannot provide observation of children in our lobby area while testing is being conducted. An adult accompanying a patient to their appointment will only be allowed in the ultrasound room at the discretion of the ultrasound technician under special circumstances. We apologize for any inconvenience.   Follow-Up: 6 months with Dr.McDowell  Any Other Special Instructions Will Be Listed Below (If Applicable).  If you need a refill on your cardiac medications before your next appointment, please call your pharmacy.

## 2023-04-29 ENCOUNTER — Other Ambulatory Visit: Payer: Self-pay | Admitting: Internal Medicine

## 2023-05-06 NOTE — Progress Notes (Unsigned)
 [name], male    DOB: Oct 28, 1950,    MRN: [id number]   Brief patient profile:  56 yowm MM/quit smoking 2004 s much resp cc  referred to pulmonary clinic in [name]  12/29/2020 by Dr  Gerda Diss for copd GOLD 3 dx 2018 / newly 02 rx x around 10/2020  Pt is s/p AVR 08/2016   History of Present Illness  12/29/2020  Pulmonary/ 1st office eval/ [name] / [name] / [name]  Chief Complaint  Patient presents with   Consult    COPD for 15 years. Diagnosed by Dr. Lilyan Punt.   2LO2 pulse portable oxygen. 2LO2 cont. At home and while sleeping. Has been on O2 for 2 months.   Dyspnea:  2lpm and 30 yards to street slt incline to house and back but does not check 02  Cough: none Sleep: 2lpm hs /flat  SABA use: rarely = once a week hfa Rec Stop trelegy and lisinopril  Benicar (olemasartan) 20 mg one daily  Plan A = Automatic = Always=  breztri Take 2 puffs first thing in am and then another 2 puffs about 12 hours later.  Work on inhaler technique:   Plan B = Backup (to supplement plan A, not to replace it) Only use your albuterol inhaler as a rescue medication Make sure you check your oxygen saturation  AT  your highest level of activity (not after you stop)  office visit in 6 weeks, call sooner if needed with all medications /inhalers/ solutions in hand  02/15/2021  f/u ov/[name]/[name] re: COPD GOLD 3/ ? acei case  maint on breztri  Chief Complaint  Patient presents with   Follow-up    Feels breathing has improved "a little bit but not a lot"  2LO2 cont. And pulse while out.   88% 2LO2 pulse form lobby to room.  92& 2LO2 pulse after sitting for approx. 5 min.   Dyspnea:  mb and back is without 02 /  same for grocery store too  Cough: worse assoc very hoarse x 3 days p back on ACEi x 3 weeks  Sleeping: flat bed on 2lpm  SABA use: maybe once  02: 2lpm HS and daytime  Covid status: vax x 5  Lung cancer screening: > 15 y since  Quit   Rec Plan A = Automatic =  Always=    Stiolto 2 puffs each am 1st thing  Plan B = Backup (to supplement plan A, not to replace it) Only use your albuterol inhaler as a rescue medication Stop lisinopril and take ibesartan 75 mg one daily  (ok to break in half if too strong)  Make sure you check your oxygen saturation  AT  your highest level of activity (not after you stop)   to be sure it stays over 90%  Please schedule a follow up office visit in 6 weeks, call sooner if needed     04/08/2022  6 m f/u ov/[name]/[name] re: GOLD 3 COPD  maint on stiolto   Chief Complaint  Patient presents with   Follow-up    Breathing a little better since last ov  Dyspnea:  walked a quarter mile night prior to OV  on 2lpm POC  Cough: none Sleeping: no resp cc lying flat 2 pillows  SABA use: once or twice a week  02: 2lpm hs and prn POC none needed at rest Covid status: vax max  Lung cancer screening: > 15 y out so not eligible Rec No  change in medications  Ok to try albuterol 15 min before an activity (on alternating days)  that you know would usually make you short of breath Make sure you check your oxygen saturation  AT  your highest level of activity (not after you stop)   to be sure it stays over 90%  Please schedule a follow up visit in 12 months but call sooner if needed     05/09/2023  f/u ov/[name]/[name] re: *** maint on ***  No chief complaint on file.   Dyspnea:  *** Cough: *** Sleeping: ***   resp cc  SABA use: *** 02: ***  Lung cancer screening: ***   No obvious day to day or daytime variability or assoc excess/ purulent sputum or mucus plugs or hemoptysis or cp or chest tightness, subjective wheeze or overt sinus or hb symptoms.    Also denies any obvious fluctuation of symptoms with weather or environmental changes or other aggravating or alleviating factors except as outlined above   No unusual exposure hx or h/o childhood pna/ asthma or knowledge of premature birth.  Current  Allergies, Complete Past Medical History, Past Surgical History, Family History, and Social History were reviewed in [employer] record.  ROS  The following are not active complaints unless bolded Hoarseness, sore throat, dysphagia, dental problems, itching, sneezing,  nasal congestion or discharge of excess mucus or purulent secretions, ear ache,   fever, chills, sweats, unintended wt loss or wt gain, classically pleuritic or exertional cp,  orthopnea pnd or arm/hand swelling  or leg swelling, presyncope, palpitations, abdominal pain, anorexia, nausea, vomiting, diarrhea  or change in bowel habits or change in bladder habits, change in stools or change in urine, dysuria, hematuria,  rash, arthralgias, visual complaints, headache, numbness, weakness or ataxia or problems with walking or coordination,  change in mood or  memory.        No outpatient medications have been marked as taking for the 05/09/23 encounter (Appointment) with [name], MD.                    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   Hyperlipidemia    Myocardial infarction [facility])    Postoperative atrial fibrillation (HCC) 2018   White coat hypertension        Objective:    Wts  05/09/2023          ***  04/08/2022         176  10/04/2021       171  03/29/2021       180   02/15/21 181 lb 1.9 oz (82.2 kg)  02/09/21 183 lb 6.4 oz (83.2 kg)  01/21/21 179 lb 8 oz (81.4 kg)   Vital signs reviewed  05/09/2023  - Note at rest 02 sats  ***% on ***   General appearance:    ***     Mild bar***            Assessment

## 2023-05-09 ENCOUNTER — Ambulatory Visit: Admitting: Internal Medicine

## 2023-05-09 ENCOUNTER — Encounter: Payer: Self-pay | Admitting: Internal Medicine

## 2023-05-09 VITALS — BP 134/73 | HR 73 | Ht 69.0 in | Wt 173.0 lb

## 2023-05-09 DIAGNOSIS — J449 Chronic obstructive pulmonary disease, unspecified: Secondary | ICD-10-CM | POA: Diagnosis not present

## 2023-05-09 DIAGNOSIS — J9611 Chronic respiratory failure with hypoxia: Secondary | ICD-10-CM

## 2023-05-09 MED ORDER — STIOLTO RESPIMAT 2.5-2.5 MCG/ACT IN AERS: INHALATION_SPRAY | RESPIRATORY_TRACT | 11 refills | Status: AC

## 2023-05-09 NOTE — Assessment & Plan Note (Signed)
 Quit smoking x 2005 with Onset of symptoms  Around 2018  - PFT's  12/29/2020  FEV1 1.09 (32 % ) ratio 0.35  p 1 % improvement from saba p ? prior to study with DLCO  12.41 (40%) corrects to 2.51 (55%)  for alv volume and FV curve classicall concave  - 12/29/2020   breztri trial and off acei. Labs ordered 12/29/2020  :  allergy profile Eos 0.1/ IgE 4    alpha one AT phenotype   MM - 02/15/2021  After extensive coaching inhaler device,  effectiveness =    90% with SMI > stiolto trial and again off acei for UACS/ hoarseness > much better 03/29/2021   Pt is Group B in terms of symptom/risk and laba/lama therefore appropriate rx at this point >>>  stiolto doing well, no change in rx needed

## 2023-05-09 NOTE — Patient Instructions (Signed)
Make sure you check your oxygen saturation  AT  your highest level of activity (not after you stop)   to be sure it stays over 90% and adjust  02 flow upward to maintain this level if needed but remember to turn it back to previous settings when you stop (to conserve your supply).  ° ° °Please schedule a follow up visit in 12 months but call sooner if needed  °

## 2023-05-09 NOTE — Assessment & Plan Note (Addendum)
 Placed on 02 around 10/2020 - 12/29/2020   Walked on 2lpm pulsed x  3  lap(s) =  approx 450 @ moderate then slower pace, stopped due to end of study with onset of doe during 2nd with lowest 02 sats 90%  - 03/29/2021   Walked on RA  x  1  lap(s) =  approx 150  ft  @ mod pace, stopped due to  lowest 02 sats 88%  Then   Place on 2LO2 pulse and sats increased to 93%. Walked last two laps at a moderate pace on 2LO2 pulse lowest sat 90%  - sat on RA at rest 94% 10/04/2021  and 96% 04/08/2022  - .04/08/2022   Walked on RA  x  3  lap(s) =  approx 450  ft  @ mod pace, stopped due to end of study with lowest 02 sats 88% at end and mild sob p 150 ft but never stopped  Rec continue 02 2lpm hs and prn daytime for sats > 90%   Specifically: Make sure you check your oxygen saturation  AT  your highest level of activity (not after you stop)   to be sure it stays over 90% and adjust  02 flow upward on POC  to maintain this level if needed but remember to turn it back to previous settings when you stop (to conserve your supply).   F/u yearly / sooner prn     Each maintenance medication was reviewed in detail including emphasizing most importantly the difference between maintenance and prns and under what circumstances the prns are to be triggered using an action plan format where appropriate.  Total time for H and P, chart review, counseling, reviewing SMI/ POC/ pulse ox  device(s) and generating customized AVS unique to this office visit / same day charting = 24 min

## 2023-06-02 ENCOUNTER — Ambulatory Visit: Payer: Medicare Other

## 2023-06-02 VITALS — Ht 69.0 in | Wt 173.0 lb

## 2023-06-02 DIAGNOSIS — Z0000 Encounter for general adult medical examination without abnormal findings: Secondary | ICD-10-CM

## 2023-06-02 NOTE — Progress Notes (Signed)
 Subjective:   [name] is a 73 y.o. who presents for a Medicare Wellness preventive visit.  Visit Complete: Virtual I connected with  [name] on 06/02/23 by a audio enabled telemedicine application and verified that I am speaking with the correct person using two identifiers.  Patient Location: Home  Provider Location: Home Office  I discussed the limitations of evaluation and management by telemedicine. The patient expressed understanding and agreed to proceed.  Vital Signs: Because this visit was a virtual/telehealth visit, some criteria may be missing or patient reported. Any vitals not documented were not able to be obtained and vitals that have been documented are patient reported.  VideoDeclined- This patient declined Librarian, academic. Therefore the visit was completed with audio only.  Persons Participating in Visit: Patient.  AWV Questionnaire: No: Patient Medicare AWV questionnaire was not completed prior to this visit.  Cardiac Risk Factors include: advanced age (>23men, >87 women);male gender;hypertension;dyslipidemia     Objective:    Today's Vitals   06/02/23 1312  Weight: 173 lb (78.5 kg)  Height: 5\' 9"  (1.753 m)   Body mass index is 25.55 kg/m.     06/02/2023    1:26 PM 05/27/2022    1:10 PM 05/11/2021    1:56 PM 01/17/2019    9:11 AM 12/11/2017   10:00 PM 12/06/2017    9:15 AM 09/05/2016   11:00 PM  Advanced Directives  Does Patient Have a Medical Advance Directive? No No Yes Yes No No No  Type of Best boy of [place];Living will Healthcare Power of [place];Living will     Does patient want to make changes to medical advance directive?    No - Patient declined     Copy of Healthcare Power of Attorney in Chart?   No - copy requested      Would patient like information on creating a medical advance directive? Yes (MAU/Ambulatory/Procedural Areas - Information given) No - Patient  declined   No - Patient declined No - Patient declined No - Patient declined    Current Medications (verified) Outpatient Encounter Medications as of 06/02/2023  Medication Sig   acetaminophen  (TYLENOL ) 500 MG tablet Take 500 mg by mouth every 6 (six) hours as needed for moderate pain.    albuterol  (VENTOLIN  HFA) 108 (90 Base) MCG/ACT inhaler 2 puff q4 hours prn   diltiazem  (CARDIZEM ) 60 MG tablet TAKE 1 TABLET BY MOUTH TWICE A DAY   ELIQUIS  5 MG TABS tablet TAKE 1 TABLET BY MOUTH TWICE A DAY   escitalopram  (LEXAPRO ) 10 MG tablet Take 1 tablet (10 mg total) by mouth daily.   ezetimibe  (ZETIA ) 10 MG tablet TAKE 1 TABLET BY MOUTH EVERY DAY   ferrous sulfate  325 (65 FE) MG tablet Take 325 mg by mouth daily with breakfast.   fexofenadine (ALLEGRA) 180 MG tablet Take 180 mg by mouth daily.   fluticasone  (FLONASE ) 50 MCG/ACT nasal spray Place 2 sprays into both nostrils daily.   irbesartan  (AVAPRO ) 75 MG tablet TAKE 1 TABLET BY MOUTH EVERY DAY   Multiple Vitamin (MULTIVITAMIN) tablet Take 1 tablet by mouth daily.   OXYGEN  Inhale into the lungs.   rosuvastatin  (CRESTOR ) 40 MG tablet TAKE 1 TABLET BY MOUTH EVERY DAY   Tiotropium Bromide -Olodaterol (STIOLTO RESPIMAT ) 2.5-2.5 MCG/ACT AERS 2 puffs each am   vitamin C (ASCORBIC ACID) 500 MG tablet Take 500 mg by mouth daily.    No facility-administered encounter medications on file as  of 06/02/2023.    Allergies (verified) Ciprofloxacin   History: Past Medical History:  Diagnosis Date   AAA (abdominal aortic aneurysm) (HCC)    Status post EVAR November 2019   Allergy    Seasonal allergies and ciproflaxacin ( artificial heart valve   Arthritis    COPD (chronic obstructive pulmonary disease) (HCC)    Coronary artery disease    Status post LIMA to LAD and SVG to circumflex July 2018   Emphysema of lung (HCC) 06/14/2012   COPD   Essential hypertension    Fatty liver    History of aortic stenosis    Status post 23 mm Magna Ease pericardial AVR  July 2018   Hyperlipidemia    Myocardial infarction [facility])    Oxygen  deficiency 10/27/2020   Use when walking distance.   Paroxysmal atrial fibrillation (HCC)    White coat hypertension    Past Surgical History:  Procedure Laterality Date   ABDOMINAL AORTIC ENDOVASCULAR STENT GRAFT N/A 12/11/2017   Procedure: ABDOMINAL AORTIC ENDOVASCULAR STENT GRAFT;  Surgeon: Richrd Char, MD;  Location: [facility] OR;  Service: Vascular;  Laterality: N/A;   ANKLE SURGERY Right 02/08/2007   AORTIC VALVE REPLACEMENT N/A 09/05/2016   Procedure: AORTIC VALVE REPLACEMENT (AVR) USING 23 MM MAGNA EASE PERICARDIAL TISSUE VALVE;  Surgeon: Norita Beauvais, MD;  Location: [facility] OR;  Service: Open Heart Surgery;  Laterality: N/A;   APPENDECTOMY     CARDIAC CATHETERIZATION N/A 02/02/2016   Procedure: Left Heart Cath and Coronary Angiography;  Surgeon: Chapman Commodore, MD;  Location: [facility] INVASIVE CV LAB;  Service: Cardiovascular;  Laterality: N/A;   CARDIAC VALVE REPLACEMENT  09/05/2016   COLONOSCOPY  06/22/2010   [id number] rectal and right colon polyps removed remainder rectum and colon appared normal   COLONOSCOPY WITH PROPOFOL  N/A 05/15/2014   Procedure: COLONOSCOPY WITH PROPOFOL ;  Surgeon: Suzette Espy, MD;  Location: [facility] ORS;  Service: Endoscopy;  Laterality: N/A;  In cecum @ 0918, out @ 0937, withdrawal time 19 minutes   CORONARY ARTERY BYPASS GRAFT N/A 09/05/2016   Procedure: CORONARY ARTERY BYPASS GRAFTING (CABG) USING LIMA TO DISTAL LAD AND ENDOSCOPICALLY HARVESTED GREATER SAPHENOUS VEIN TO CIRC.;  Surgeon: Norita Beauvais, MD;  Location: [facility] OR;  Service: Open Heart Surgery;  Laterality: N/A;   ENDOVEIN HARVEST OF GREATER SAPHENOUS VEIN Right 09/05/2016   Procedure: ENDOVEIN HARVEST OF GREATER SAPHENOUS VEIN;  Surgeon: Norita Beauvais, MD;  Location: [facility] OR;  Service: Open Heart Surgery;  Laterality: Right;   FRACTURE SURGERY  01/2008   Ankle   POLYPECTOMY N/A 05/15/2014   Procedure: POLYPECTOMY;   Surgeon: Suzette Espy, MD;  Location: [facility] ORS;  Service: Endoscopy;  Laterality: N/A;   RIGHT/LEFT HEART CATH AND CORONARY ANGIOGRAPHY N/A 08/29/2016   Procedure: Right/Left Heart Cath and Coronary Angiography;  Surgeon: Arnoldo Lapping, MD;  Location: [facility] INVASIVE CV LAB;  Service: Cardiovascular;  Laterality: N/A;   TEE WITHOUT CARDIOVERSION N/A 09/05/2016   Procedure: TRANSESOPHAGEAL ECHOCARDIOGRAM (TEE);  Surgeon: Norita Beauvais, MD;  Location: [facility] OR;  Service: Open Heart Surgery;  Laterality: N/A;   Family History  Problem Relation Age of Onset   COPD Mother    Cancer Father    Hypertension Sister    Arthritis Sister    Cancer Sister    AAA (abdominal aortic aneurysm) Brother    Cancer Daughter    Hypertension Daughter    Hypertension Daughter    Colon cancer Neg Hx    Liver disease  Neg Hx    Social History   Socioeconomic History   Marital status: Married    Spouse name: Trenia Fritter   Number of children: Not on file   Years of education: 12   Highest education level: 12th grade  Occupational History   Not on file  Tobacco Use   Smoking status: Former    Current packs/day: 0.00    Average packs/day: 1.5 packs/day for 40.0 years (60.0 ttl pk-yrs)    Types: Cigarettes    Start date: 07/20/1962    Quit date: 07/20/2002    Years since quitting: 20.8    Passive exposure: Never   Smokeless tobacco: Never  Vaping Use   Vaping status: Never Used  Substance and Sexual Activity   Alcohol use: Not Currently    Comment: per pt   Drug use: No   Sexual activity: Not Currently    Birth control/protection: Abstinence  Other Topics Concern   Not on file  Social History Narrative   [facility] (09/13/16):   Previously worked in a Tax inspector. Also worked Production designer, theatre/television/film. Currently has an outdoor cat and no indoor pets. No bird or mold exposure.   Social Drivers of Corporate investment banker Strain: Low Risk  (06/02/2023)   Overall Financial Resource Strain  (CARDIA)    Difficulty of Paying Living Expenses: Not hard at all  Food Insecurity: No Food Insecurity (06/02/2023)   Hunger Vital Sign    Worried About Running Out of Food in the Last Year: Never true    Ran Out of Food in the Last Year: Never true  Transportation Needs: No Transportation Needs (06/02/2023)   PRAPARE - Administrator, Civil Service (Medical): No    Lack of Transportation (Non-Medical): No  Physical Activity: Insufficiently Active (06/02/2023)   Exercise Vital Sign    Days of Exercise per Week: 2 days    Minutes of Exercise per Session: 30 min  Stress: No Stress Concern Present (06/02/2023)   [employer] of Occupational Health - Occupational Stress Questionnaire    Feeling of Stress : Not at all  Social Connections: Socially Integrated (06/02/2023)   Social Connection and Isolation Panel [NHANES]    Frequency of Communication with Friends and Family: More than three times a week    Frequency of Social Gatherings with Friends and Family: Twice a week    Attends Religious Services: More than 4 times per year    Active Member of [employer] or Organizations: Yes    Attends Engineer, structural: More than 4 times per year    Marital Status: Married    Tobacco Counseling Counseling given: Not Answered    Clinical Intake:  Pre-visit preparation completed: Yes  Pain : No/denies pain     Diabetes: No  Lab Results  Component Value Date   HGBA1C 5.8 (H) 09/01/2016   HGBA1C 5.6 02/01/2016   HGBA1C 5.5 11/19/2015     How often do you need to have someone help you when you read instructions, pamphlets, or other written materials from your doctor or pharmacy?: 1 - Never  Interpreter Needed?: No  Information entered by :: Seabron Cypress LPN   Activities of Daily Living     06/02/2023    1:13 PM  In your present state of health, do you have any difficulty performing the following activities:  Hearing? 0  Vision? 0  Difficulty concentrating  or making decisions? 0  Walking or climbing stairs? 0  Dressing or  bathing? 0  Doing errands, shopping? 0  Preparing Food and eating ? N  Using the Toilet? N  In the past six months, have you accidently leaked urine? N  Do you have problems with loss of bowel control? N  Managing your Medications? N  Managing your Finances? N  Housekeeping or managing your Housekeeping? N    Patient Care Team: Bennet Brasil, MD as PCP - General (Family Medicine) Gerard Knight, MD as PCP - Cardiology (Cardiology) Riley Cheadle Windsor Hatcher, MD as Consulting Physician (Gastroenterology) Diamond Formica, MD as Consulting Physician (Pulmonary Disease) Maryanne Smiles (Vascular Surgery) Hyland Mailman, MD as Referring Physician (Optometry) Diamond Formica, MD as Consulting Physician (Pulmonary Disease)  Indicate any recent Medical Services you may have received from other than [facility] providers in the past year (date may be approximate).     Assessment:   This is a routine wellness examination for [name].  Hearing/Vision screen Hearing Screening - Comments:: Denies hearing difficulties   Vision Screening - Comments:: Wears rx glasses - up to date with routine eye exams with Dr. Buck Carbon    Goals Addressed             This Visit's Progress    Exercise 3x per week (30 min per time)   On track    Try to increase exercise.        Depression Screen     06/02/2023    1:15 PM 12/30/2022    8:35 AM 05/27/2022    1:09 PM 12/23/2021   10:32 AM 05/11/2021    1:51 PM 10/27/2020    8:41 AM 05/04/2020   10:46 AM  PHQ 2/9 Scores  PHQ - 2 Score 0 0 0 1 0 0 0  PHQ- 9 Score  1  2       Fall Risk     06/02/2023    1:26 PM 12/30/2022    8:35 AM 06/23/2022    9:44 AM 05/27/2022    1:07 PM 05/24/2022    9:28 PM  Fall Risk   Falls in the past year? 0 0 0 0 0  Number falls in past yr: 0  0 0 0  Injury with Fall? 0  0 0 0  Risk for fall due to : No Fall Risks  No Fall Risks No Fall Risks   Follow up  Falls prevention discussed;Education provided;Falls evaluation completed  Falls evaluation completed Falls prevention discussed;Education provided;Falls evaluation completed     MEDICARE RISK AT HOME:  Medicare Risk at Home Any stairs in or around the home?: No If so, are there any without handrails?: No Home free of loose throw rugs in walkways, pet beds, electrical cords, etc?: Yes Adequate lighting in your home to reduce risk of falls?: Yes Life alert?: No Use of a cane, walker or w/c?: No Grab bars in the bathroom?: Yes Shower chair or bench in shower?: No Elevated toilet seat or a handicapped toilet?: Yes  TIMED UP AND GO:  Was the test performed?  No  Cognitive Function: 6CIT completed        06/02/2023    1:26 PM 05/27/2022    1:10 PM 05/11/2021    1:59 PM  6CIT Screen  What Year? 0 points 0 points 0 points  What month? 0 points 0 points 0 points  What time? 0 points 0 points 0 points  Count back from 20 0 points 0 points 0 points  Months in reverse  0 points 0 points 0 points  Repeat phrase 0 points 0 points 0 points  Total Score 0 points 0 points 0 points    Immunizations Immunization History  Administered Date(s) Administered   Fluad Quad(high Dose 65+) 10/27/2020, 10/27/2021   Influenza, High Dose Seasonal PF 11/05/2018, 11/08/2022   Influenza,inj,Quad PF,6+ Mos 12/01/2014, 12/02/2016, 12/04/2017   Influenza-Unspecified 08/08/2011, 10/30/2012, 10/29/2013, 10/10/2019   Moderna Sars-Covid-2 Vaccination 03/14/2019, 04/12/2019   PFIZER(Purple Top)SARS-COV-2 Vaccination 12/12/2019   Pfizer Covid-19 Vaccine Bivalent Booster 43yrs & up 11/10/2020   Pfizer(Comirnaty)Fall Seasonal Vaccine 12 years and older 11/29/2022   Pneumococcal Conjugate-13 12/03/2015   Pneumococcal Polysaccharide-23 09/25/2009, 06/12/2019   Respiratory Syncytial Virus Vaccine,Recomb Aduvanted(Arexvy) 11/22/2021   Tdap 09/08/2010   Unspecified SARS-COV-2 Vaccination 11/01/2021   Zoster  Recombinant(Shingrix ) 12/11/2018, 05/13/2019   Zoster, Live 10/28/2010, 11/20/2012    Screening Tests Health Maintenance  Topic Date Due   DTaP/Tdap/Td (2 - Td or Tdap) 09/07/2020   COVID-19 Vaccine (7 - Mixed Product risk 2024-25 season) 05/30/2023   INFLUENZA VACCINE  09/08/2023   Colonoscopy  05/14/2024   Medicare Annual Wellness (AWV)  06/01/2024   Pneumonia Vaccine 31+ Years old  Completed   Hepatitis C Screening  Completed   Zoster Vaccines- Shingrix   Completed   HPV VACCINES  Aged Out   Meningococcal B Vaccine  Aged Out    Health Maintenance  Health Maintenance Due  Topic Date Due   DTaP/Tdap/Td (2 - Td or Tdap) 09/07/2020   COVID-19 Vaccine (7 - Mixed Product risk 2024-25 season) 05/30/2023    Additional Screening:  Vision Screening: Recommended annual ophthalmology exams for early detection of glaucoma and other disorders of the eye.  Dental Screening: Recommended annual dental exams for proper oral hygiene  Community Resource Referral / Chronic Care Management: CRR required this visit?  No   CCM required this visit?  No     Plan:     I have personally reviewed and noted the following in the patient's chart:   Medical and social history Use of alcohol, tobacco or illicit drugs  Current medications and supplements including opioid prescriptions. Patient is not currently taking opioid prescriptions. Functional ability and status Nutritional status Physical activity Advanced directives List of other physicians Hospitalizations, surgeries, and ER visits in previous 12 months Vitals Screenings to include cognitive, depression, and falls Referrals and appointments  In addition, I have reviewed and discussed with patient certain preventive protocols, quality metrics, and best practice recommendations. A written personalized care plan for preventive services as well as general preventive health recommendations were provided to patient.     Seabron Cypress [place], [place]   1/61/0960   After Visit Summary: (MyChart) Due to this being a telephonic visit, the after visit summary with patients personalized plan was offered to patient via MyChart   Notes: Nothing significant to report at this time.

## 2023-06-02 NOTE — Patient Instructions (Signed)
 [name] , Thank you for taking time to come for your Medicare Wellness Visit. I appreciate your ongoing commitment to your health goals. Please review the following plan we discussed and let me know if I can assist you in the future.   Referrals/Orders/Follow-Ups/Clinician Recommendations: Aim for 30 minutes of exercise or brisk walking, 6-8 glasses of water , and 5 servings of fruits and vegetables each day.  This is a list of the screening recommended for you and due dates:  Health Maintenance  Topic Date Due   DTaP/Tdap/Td vaccine (2 - Td or Tdap) 09/07/2020   COVID-19 Vaccine (7 - Mixed Product risk 2024-25 season) 05/30/2023   Flu Shot  09/08/2023   Colon Cancer Screening  05/14/2024   Medicare Annual Wellness Visit  06/01/2024   Pneumonia Vaccine  Completed   Hepatitis C Screening  Completed   Zoster (Shingles) Vaccine  Completed   HPV Vaccine  Aged Out   Meningitis B Vaccine  Aged Out    Advanced directives: (ACP Link)Information on Advanced Care Planning can be found at [place]  Secretary of [facility] Advance Health Care Directives Advance Health Care Directives. [email or web address]   Next Medicare Annual Wellness Visit scheduled for next year: Yes

## 2023-06-23 DIAGNOSIS — E785 Hyperlipidemia, unspecified: Secondary | ICD-10-CM | POA: Diagnosis not present

## 2023-06-23 DIAGNOSIS — R7401 Elevation of levels of liver transaminase levels: Secondary | ICD-10-CM | POA: Diagnosis not present

## 2023-06-23 DIAGNOSIS — I10 Essential (primary) hypertension: Secondary | ICD-10-CM | POA: Diagnosis not present

## 2023-06-24 LAB — BASIC METABOLIC PANEL WITH GFR
BUN/Creatinine Ratio: 20 (ref 10–24)
BUN: 12 mg/dL (ref 8–27)
CO2: 29 mmol/L (ref 20–29)
Calcium: 9.5 mg/dL (ref 8.6–10.2)
Chloride: 98 mmol/L (ref 96–106)
Creatinine, Ser: 0.6 mg/dL — ABNORMAL LOW (ref 0.76–1.27)
Glucose: 93 mg/dL (ref 70–99)
Potassium: 4.3 mmol/L (ref 3.5–5.2)
Sodium: 139 mmol/L (ref 134–144)
eGFR: 103 mL/min/{1.73_m2} (ref >59)

## 2023-06-24 LAB — LIPID PANEL
Chol/HDL Ratio: 2.4 ratio (ref 0.0–5.0)
Cholesterol, Total: 122 mg/dL (ref 100–199)
HDL: 51 mg/dL (ref >39)
LDL Chol Calc (NIH): 55 mg/dL (ref 0–99)
Triglycerides: 79 mg/dL (ref 0–149)
VLDL Cholesterol Cal: 16 mg/dL (ref 5–40)

## 2023-06-24 LAB — HEPATIC FUNCTION PANEL
ALT: 18 IU/L (ref 0–44)
AST: 23 IU/L (ref 0–40)
Albumin: 4.9 g/dL — ABNORMAL HIGH (ref 3.8–4.8)
Alkaline Phosphatase: 44 IU/L (ref 44–121)
Bilirubin Total: 0.5 mg/dL (ref 0.0–1.2)
Bilirubin, Direct: 0.19 mg/dL (ref 0.00–0.40)
Total Protein: 6.8 g/dL (ref 6.0–8.5)

## 2023-06-24 LAB — PSA: Prostate Specific Ag, Serum: 3.5 ng/mL (ref 0.0–4.0)

## 2023-06-26 ENCOUNTER — Ambulatory Visit: Payer: Self-pay | Admitting: Family Medicine

## 2023-06-28 ENCOUNTER — Ambulatory Visit: Payer: Medicare Other | Admitting: Family Medicine

## 2023-06-30 ENCOUNTER — Other Ambulatory Visit: Payer: Self-pay | Admitting: Family Medicine

## 2023-07-07 ENCOUNTER — Ambulatory Visit: Payer: Medicare Other | Admitting: Family Medicine

## 2023-07-07 VITALS — BP 118/68 | HR 76 | Temp 97.9°F | Ht 69.0 in | Wt 172.2 lb

## 2023-07-07 DIAGNOSIS — I480 Paroxysmal atrial fibrillation: Secondary | ICD-10-CM

## 2023-07-07 DIAGNOSIS — J449 Chronic obstructive pulmonary disease, unspecified: Secondary | ICD-10-CM | POA: Diagnosis not present

## 2023-07-07 DIAGNOSIS — J9611 Chronic respiratory failure with hypoxia: Secondary | ICD-10-CM | POA: Diagnosis not present

## 2023-07-07 DIAGNOSIS — E785 Hyperlipidemia, unspecified: Secondary | ICD-10-CM

## 2023-07-07 DIAGNOSIS — R7303 Prediabetes: Secondary | ICD-10-CM | POA: Diagnosis not present

## 2023-07-07 NOTE — Progress Notes (Signed)
   Subjective:    Patient ID: [name], male    DOB: [birth year], 73 y.o.   MRN: [id number]  HPI Pt comes in today for 6 month follow up with questions or concerns at this time.  Allergies and medications reviewed. Discussed the use of AI scribe software for clinical note transcription with the patient, who gave verbal consent to proceed.  History of Present Illness   [name] "[name]" is a 73 year old male who presents for a routine follow-up visit.  His chronic lung condition remains stable with no significant changes since the last visit. He experiences variable energy levels, with some days better than others, and factors like humidity affect his breathing. He uses supplemental oxygen  during physical activities, such as yard work, to maintain oxygen  saturation in the low nineties.  He maintains a regular sleep schedule, going to bed around 10:00 to 10:30 PM and waking up between 7:00 and 7:30 AM. He occasionally wakes up at night to urinate but sometimes sleeps through the night without interruption.  Bowel movements are regular, and there is no blood in stools. He reports a good urinary flow and no issues with balance or cognitive function.  His medications are organized by his wife into a container for morning and night doses. He continues to use Flonase  for allergies, which helps manage his symptoms.  He is mindful of his diet, trying to control how much he eats out, and is able to afford his medications without issue.       Review of Systems     Objective:   Physical Exam General-in no acute distress Eyes-no discharge Lungs-respiratory rate normal, CTA CV-no murmurs,RRR Extremities skin warm dry no edema Neuro grossly normal Behavior normal, alert        Assessment & Plan:  1. Paroxysmal atrial fibrillation (HCC) (Primary) Patient denies any bleeding issues Heart condition under good control  2. Chronic respiratory failure with hypoxia  (HCC) Patient does uses O2 especially with activity to try to keep his oxygen  in the low 90s without it with activity or dropping to the mid 80s or less  3. COPD GOLD 3 with hypoxia (HCC) Follows with pulmonary on a regular basis utilizing his medication  4. Prediabetes Denies any setbacks trying to stay healthy with eating habits  5. Hyperlipidemia, unspecified hyperlipidemia type Recent cholesterol looks good we will do lab work later this year  Assessment and Plan    Chronic respiratory insufficiency Symptoms well-managed. Oxygen  saturation stable in low nineties with activity. No recent changes by pulmonologist. - Continue current oxygen  therapy regimen during activities.  Allergic rhinitis Symptoms managed with Flonase . Exacerbated by pollen. - Continue Flonase  nasal spray as needed.  Senile purpura Minor bruising due to fragile vessels and thin skin. - Advise wearing protective clothing to prevent skin trauma.  General Health Maintenance Discussed vaccinations and screenings. Advised on COVID booster timing. RSV vaccine received, no further doses needed. Annual PSA monitoring recommended. - Receive flu vaccine in the fall. - Wait for new COVID booster in August or September. - Continue annual PSA monitoring. - Schedule blood work in six months.

## 2023-07-09 ENCOUNTER — Other Ambulatory Visit: Payer: Self-pay | Admitting: Cardiology

## 2023-07-09 DIAGNOSIS — Z952 Presence of prosthetic heart valve: Secondary | ICD-10-CM

## 2023-07-09 DIAGNOSIS — I480 Paroxysmal atrial fibrillation: Secondary | ICD-10-CM

## 2023-07-10 NOTE — Telephone Encounter (Signed)
 Prescription refill request for Eliquis  received. Indication: PAF Last office visit: 04/19/23   Scr: 0.60 on 06/23/23  Epic Age: 73 Weight: 78.4kg  Based on above findings Eliquis  5mg  twice daily is the appropriate dose.  Refill approved.

## 2023-07-10 NOTE — Telephone Encounter (Signed)
 Prescription refill request for Eliquis  received. Indication:afib Last office visit:3/25 Scr:0.60  5/25 Age: 73 Weight:78.1  kg  Prescription refilled

## 2023-07-12 ENCOUNTER — Other Ambulatory Visit: Payer: Self-pay

## 2023-07-12 DIAGNOSIS — R7303 Prediabetes: Secondary | ICD-10-CM

## 2023-07-12 DIAGNOSIS — Z79899 Other long term (current) drug therapy: Secondary | ICD-10-CM

## 2023-07-12 DIAGNOSIS — E785 Hyperlipidemia, unspecified: Secondary | ICD-10-CM

## 2023-07-12 DIAGNOSIS — I10 Essential (primary) hypertension: Secondary | ICD-10-CM

## 2023-08-05 ENCOUNTER — Other Ambulatory Visit: Payer: Self-pay | Admitting: Family Medicine

## 2023-08-23 IMAGING — DX DG CHEST 2V
2 series · 2 of 2 positions shown · non-contrast
Comparison: X-ray chest 12/11/2017.

CLINICAL DATA: Chronic obstructive pulmonary disease with hypoxia.

EXAM:
CHEST - 2 VIEW

[chest pa]
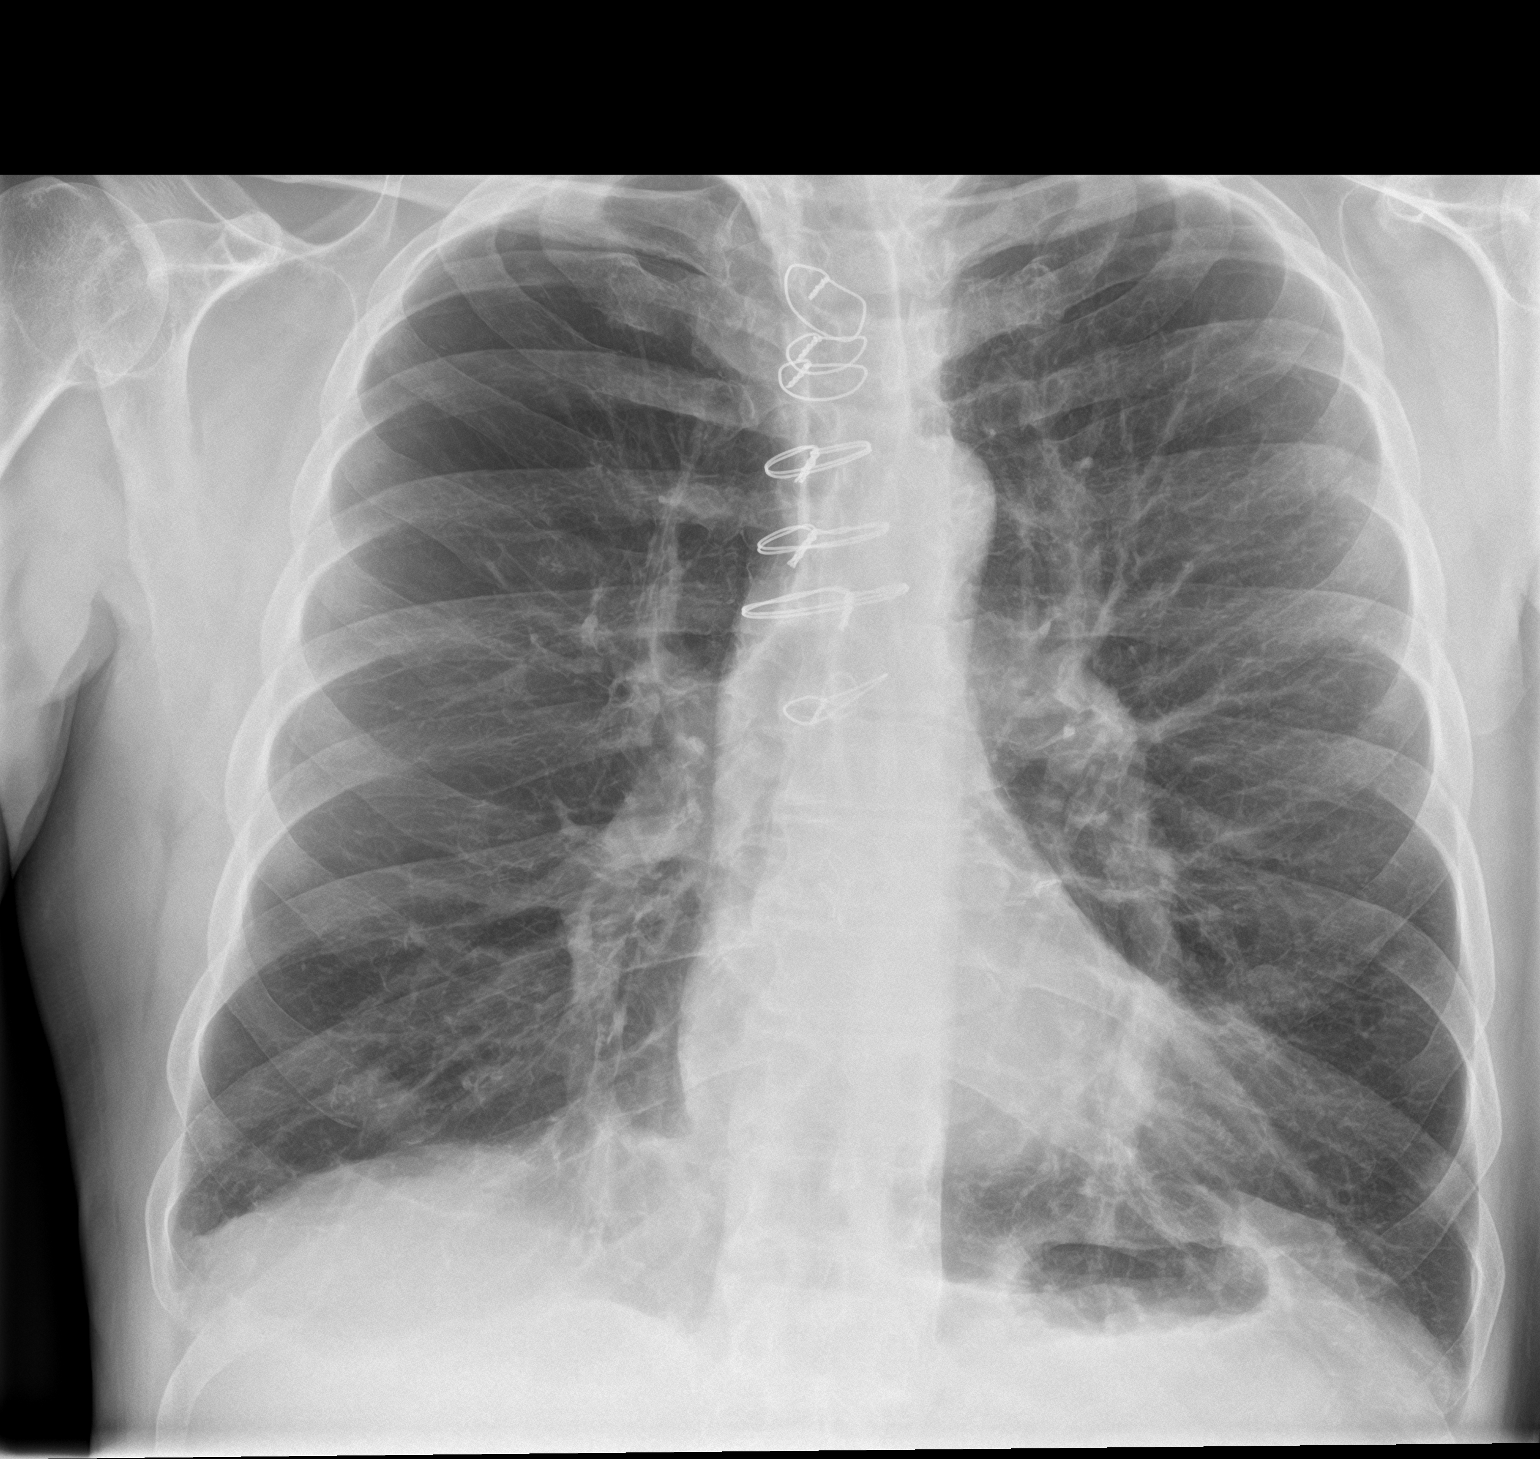

[chest lat]
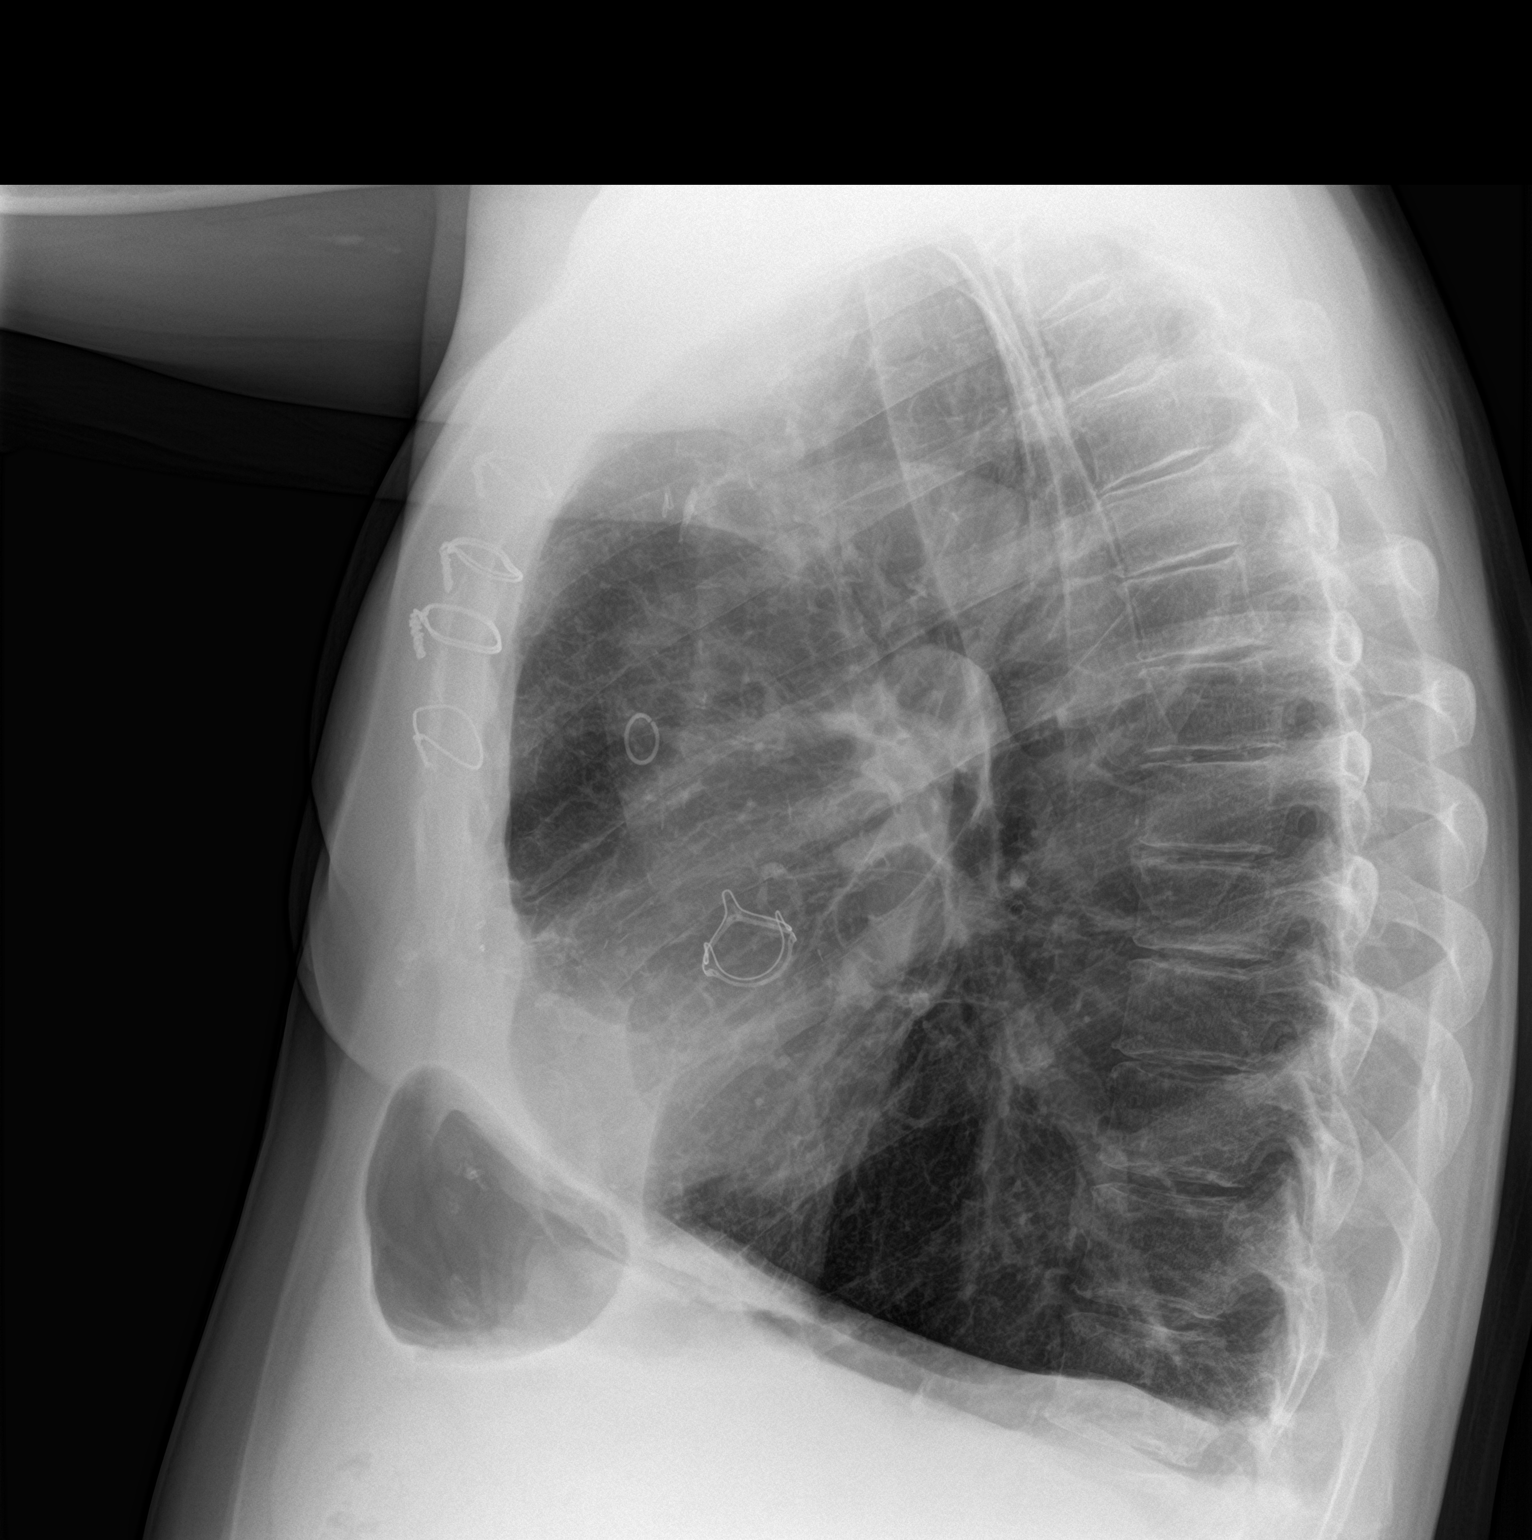

[2 of 2 positions shown; findings below may reference images not displayed]

FINDINGS: Prior median sternotomy with CABG and cardiac valve replacement.
Normal heart size. Hyperinflated lungs. No focal airspace
consolidation, pleural effusion, or pneumothorax.
IMPRESSION: COPD.  No active cardiopulmonary disease.

## 2023-09-07 ENCOUNTER — Other Ambulatory Visit: Payer: Self-pay | Admitting: Family Medicine

## 2023-09-08 ENCOUNTER — Other Ambulatory Visit: Payer: Self-pay

## 2023-09-08 MED ORDER — FLUTICASONE PROPIONATE 50 MCG/ACT NA SUSP: 2.0000 | Freq: Every day | NASAL | 5 refills | Status: AC

## 2023-09-19 ENCOUNTER — Ambulatory Visit

## 2023-09-19 ENCOUNTER — Ambulatory Visit: Payer: Medicare Other

## 2023-09-19 ENCOUNTER — Other Ambulatory Visit: Payer: Medicare Other

## 2023-09-19 DIAGNOSIS — I7143 Infrarenal abdominal aortic aneurysm, without rupture: Secondary | ICD-10-CM

## 2023-09-21 ENCOUNTER — Other Ambulatory Visit: Payer: Self-pay | Admitting: Family Medicine

## 2023-09-22 ENCOUNTER — Other Ambulatory Visit: Payer: Self-pay

## 2023-09-22 MED ORDER — DILTIAZEM HCL 60 MG PO TABS: 60.0000 mg | ORAL_TABLET | Freq: Two times a day (BID) | ORAL | 1 refills | Status: DC

## 2023-10-02 NOTE — Progress Notes (Unsigned)
 Office Note     CC:  follow up Requesting Provider:  Alphonsa Glendia LABOR, MD  HPI: [name] is a 73 y.o. (1950/10/13) male who presents for surveillance of endovascular repair of abdominal aortic aneurysm in November 2021 by Dr. [name].  His mobility is admittedly limited due to COPD.  He denies any new or changing abdominal or back pain.  He also denies any claudication, rest pain, or tissue changes of bilateral lower extremities.  He is on daily statin.  He is on Eliquis  for atrial fibrillation.  He is a former smoker.  ***  Past Medical History:  Diagnosis Date   AAA (abdominal aortic aneurysm) (HCC)    Status post EVAR November 2019   Allergy    Seasonal allergies and ciproflaxacin ( artificial heart valve   Arthritis    COPD (chronic obstructive pulmonary disease) (HCC)    Coronary artery disease    Status post LIMA to LAD and SVG to circumflex July 2018   Emphysema of lung (HCC) 06/14/2012   COPD   Essential hypertension    Fatty liver    History of aortic stenosis    Status post 23 mm Magna Ease pericardial AVR July 2018   Hyperlipidemia    Myocardial infarction [facility])    Oxygen  deficiency 10/27/2020   Use when walking distance.   Paroxysmal atrial fibrillation (HCC)    White coat hypertension     Past Surgical History:  Procedure Laterality Date   ABDOMINAL AORTIC ENDOVASCULAR STENT GRAFT N/A 12/11/2017   Procedure: ABDOMINAL AORTIC ENDOVASCULAR STENT GRAFT;  Surgeon: [name] Carlin BRAVO, MD;  Location: [facility] OR;  Service: Vascular;  Laterality: N/A;   ANKLE SURGERY Right 02/08/2007   AORTIC VALVE REPLACEMENT N/A 09/05/2016   Procedure: AORTIC VALVE REPLACEMENT (AVR) USING 23 MM MAGNA EASE PERICARDIAL TISSUE VALVE;  Surgeon: Army Dallas NOVAK, MD;  Location: [facility] OR;  Service: Open Heart Surgery;  Laterality: N/A;   APPENDECTOMY     CARDIAC CATHETERIZATION N/A 02/02/2016   Procedure: Left Heart Cath and Coronary Angiography;  Surgeon: Rober Chroman, MD;  Location: [facility] [place]  INVASIVE CV LAB;  Service: Cardiovascular;  Laterality: N/A;   CARDIAC VALVE REPLACEMENT  09/05/2016   COLONOSCOPY  06/22/2010   MFM:[id number] rectal and right colon polyps removed remainder rectum and colon appared normal   COLONOSCOPY WITH PROPOFOL  N/A 05/15/2014   Procedure: COLONOSCOPY WITH PROPOFOL ;  Surgeon: Lamar CHRISTELLA Hollingshead, MD;  Location: [facility] ORS;  Service: Endoscopy;  Laterality: N/A;  In cecum @ 0918, out @ 0937, withdrawal time 19 minutes   CORONARY ARTERY BYPASS GRAFT N/A 09/05/2016   Procedure: CORONARY ARTERY BYPASS GRAFTING (CABG) USING LIMA TO DISTAL LAD AND ENDOSCOPICALLY HARVESTED GREATER SAPHENOUS VEIN TO CIRC.;  Surgeon: Army Dallas NOVAK, MD;  Location: [facility] OR;  Service: Open Heart Surgery;  Laterality: N/A;   ENDOVEIN HARVEST OF GREATER SAPHENOUS VEIN Right 09/05/2016   Procedure: ENDOVEIN HARVEST OF GREATER SAPHENOUS VEIN;  Surgeon: Army Dallas NOVAK, MD;  Location[facility] OR;  Service: Open Heart Surgery;  Laterality: Right;   FRACTURE SURGERY  01/2008   Ankle   POLYPECTOMY N/A 05/15/2014   Procedure: POLYPECTOMY;  Surgeon: Lamar CHRISTELLA Hollingshead, MD;  Location: [facility] ORS;  Service: Endoscopy;  Laterality: N/A;   RIGHT/LEFT HEART CATH AND CORONARY ANGIOGRAPHY N/A 08/29/2016   Procedure: Right/Left Heart Cath and Coronary Angiography;  Surgeon: Wonda Sharper, MD;  Location: [facility] INVASIVE CV LAB;  Service: Cardiovascular;  Laterality: N/A;   TEE WITHOUT CARDIOVERSION N/A 09/05/2016  Procedure: TRANSESOPHAGEAL ECHOCARDIOGRAM (TEE);  Surgeon: Army Dallas NOVAK, MD;  Location: [facility] OR;  Service: Open Heart Surgery;  Laterality: N/A;    Social History   Socioeconomic History   Marital status: Married    Spouse name: Neville   Number of children: Not on file   Years of education: 12   Highest education level: 12th grade  Occupational History   Not on file  Tobacco Use   Smoking status: Former    Current packs/day: 0.00    Average packs/day: 1.5 packs/day for 40.0 years (60.0 ttl  pk-yrs)    Types: Cigarettes    Start date: 07/20/1962    Quit date: 07/20/2002    Years since quitting: 21.2    Passive exposure: Never   Smokeless tobacco: Never  Vaping Use   Vaping status: Never Used  Substance and Sexual Activity   Alcohol use: Not Currently    Comment: per pt   Drug use: No   Sexual activity: Not Currently    Birth control/protection: Abstinence  Other Topics Concern   Not on file  Social History Narrative   [facility] (09/13/16):   Previously worked in a Tax inspector. Also worked Production designer, theatre/television/film. Currently has an outdoor cat and no indoor pets. No bird or mold exposure.   Social Drivers of Corporate investment banker Strain: Low Risk  (07/05/2023)   Overall Financial Resource Strain (CARDIA)    Difficulty of Paying Living Expenses: Not very hard  Food Insecurity: No Food Insecurity (07/05/2023)   Hunger Vital Sign    Worried About Running Out of Food in the Last Year: Never true    Ran Out of Food in the Last Year: Never true  Transportation Needs: No Transportation Needs (07/05/2023)   PRAPARE - Administrator, Civil Service (Medical): No    Lack of Transportation (Non-Medical): No  Physical Activity: Unknown (07/05/2023)   Exercise Vital Sign    Days of Exercise per Week: Patient declined    Minutes of Exercise per Session: 30 min  Recent Concern: Physical Activity - Insufficiently Active (06/02/2023)   Exercise Vital Sign    Days of Exercise per Week: 2 days    Minutes of Exercise per Session: 30 min  Stress: No Stress Concern Present (07/05/2023)   [employer] of Occupational Health - Occupational Stress Questionnaire    Feeling of Stress : Only a little  Social Connections: Socially Integrated (07/05/2023)   Social Connection and Isolation Panel    Frequency of Communication with Friends and Family: More than three times a week    Frequency of Social Gatherings with Friends and Family: Once a week    Attends Religious  Services: More than 4 times per year    Active Member of [employer] or Organizations: Yes    Attends Engineer, structural: More than 4 times per year    Marital Status: Married  Catering manager Violence: Not At Risk (06/02/2023)   Humiliation, Afraid, Rape, and Kick questionnaire    Fear of Current or Ex-Partner: No    Emotionally Abused: No    Physically Abused: No    Sexually Abused: No    Family History  Problem Relation Age of Onset   COPD Mother    Cancer Father    Hypertension Sister    Arthritis Sister    Cancer Sister    AAA (abdominal aortic aneurysm) Brother    Cancer Daughter    Hypertension Daughter  Hypertension Daughter    Colon cancer Neg Hx    Liver disease Neg Hx     Current Outpatient Medications  Medication Sig Dispense Refill   acetaminophen  (TYLENOL ) 500 MG tablet Take 500 mg by mouth every 6 (six) hours as needed for moderate pain.      albuterol  (VENTOLIN  HFA) 108 (90 Base) MCG/ACT inhaler 2 puff q4 hours prn 18 each 12   diltiazem  (CARDIZEM ) 60 MG tablet Take 1 tablet (60 mg total) by mouth 2 (two) times daily. 180 tablet 1   ELIQUIS  5 MG TABS tablet TAKE 1 TABLET BY MOUTH TWICE A DAY 60 tablet 11   escitalopram  (LEXAPRO ) 10 MG tablet TAKE 1 TABLET BY MOUTH EVERY DAY 90 tablet 1   ezetimibe  (ZETIA ) 10 MG tablet TAKE 1 TABLET BY MOUTH EVERY DAY 90 tablet 3   ferrous sulfate  325 (65 FE) MG tablet Take 325 mg by mouth daily with breakfast.     fexofenadine (ALLEGRA) 180 MG tablet Take 180 mg by mouth daily.     fluticasone  (FLONASE ) 50 MCG/ACT nasal spray Place 2 sprays into both nostrils daily. 16 mL 5   irbesartan  (AVAPRO ) 75 MG tablet TAKE 1 TABLET BY MOUTH EVERY DAY 90 tablet 3   Multiple Vitamin (MULTIVITAMIN) tablet Take 1 tablet by mouth daily.     OXYGEN  Inhale into the lungs.     rosuvastatin  (CRESTOR ) 40 MG tablet TAKE 1 TABLET BY MOUTH EVERY DAY 90 tablet 1   Tiotropium Bromide -Olodaterol (STIOLTO RESPIMAT ) 2.5-2.5 MCG/ACT AERS 2 puffs  each am 4 g 11   vitamin C (ASCORBIC ACID) 500 MG tablet Take 500 mg by mouth daily.      No current facility-administered medications for this visit.    Allergies  Allergen Reactions   Ciprofloxacin     Artificial heart valve     REVIEW OF SYSTEMS:   [X]  denotes positive finding, [ ]  denotes negative finding Cardiac  Comments:  Chest pain or chest pressure:    Shortness of breath upon exertion:    Short of breath when lying flat:    Irregular heart rhythm:        Vascular    Pain in calf, thigh, or hip brought on by ambulation:    Pain in feet at night that wakes you up from your sleep:     Blood clot in your veins:    Leg swelling:         Pulmonary    Oxygen  at home:    Productive cough:     Wheezing:         Neurologic    Sudden weakness in arms or legs:     Sudden numbness in arms or legs:     Sudden onset of difficulty speaking or slurred speech:    Temporary loss of vision in one eye:     Problems with dizziness:         Gastrointestinal    Blood in stool:     Vomited blood:         Genitourinary    Burning when urinating:     Blood in urine:        Psychiatric    Major depression:         Hematologic    Bleeding problems:    Problems with blood clotting too easily:        Skin    Rashes or ulcers:        Constitutional  Fever or chills:      PHYSICAL EXAMINATION:  There were no vitals filed for this visit.***  General:  WDWN in NAD; vital signs documented above Gait: Not observed HENT: WNL, normocephalic Pulmonary: normal non-labored breathing Cardiac: regular HR Abdomen: soft, NT, no masses Skin: without rashes Vascular Exam/Pulses: *** Extremities: {With/Without:20273} ischemic changes, {With/Without:20273} Gangrene , {With/Without:20273} cellulitis; {With/Without:20273} open wounds;  Musculoskeletal: no muscle wasting or atrophy  Neurologic: A&O X 3 Psychiatric:  The pt has Normal affect.   Non-Invasive Vascular Imaging:    AAA sac is 3.61 cm at largest diameter  Duplex is negative for popliteal artery aneurysms bilaterally    ASSESSMENT/PLAN:: 73 y.o. male here for follow up for surveillance of EVAR  Bilateral lower extremities are warm and well-perfused on exam.  EVAR duplex demonstrates a widely patent stent graft with no evidence of endoleak.  Largest diameter of AAA sac was noted to be 3.61 cm.  He also underwent bilateral lower extremity arterial duplex to check for popliteal aneurysms and these were negative bilaterally.  He will follow-up in office in another year for ongoing surveillance of EVAR.  He will continue his statin daily.  He knows to call/return office sooner with any questions or concerns.   Donnice Sender, PA-C [facility] [phone]

## 2023-10-03 ENCOUNTER — Ambulatory Visit

## 2023-10-03 ENCOUNTER — Ambulatory Visit: Admitting: Physician Assistant

## 2023-10-03 VITALS — BP 152/75 | HR 86 | Ht 69.0 in | Wt 172.2 lb

## 2023-10-03 DIAGNOSIS — I7143 Infrarenal abdominal aortic aneurysm, without rupture: Secondary | ICD-10-CM

## 2023-10-03 DIAGNOSIS — Z9889 Other specified postprocedural states: Secondary | ICD-10-CM | POA: Diagnosis not present

## 2023-10-16 ENCOUNTER — Ambulatory Visit: Attending: Cardiology

## 2023-10-16 DIAGNOSIS — I350 Nonrheumatic aortic (valve) stenosis: Secondary | ICD-10-CM

## 2023-10-18 ENCOUNTER — Ambulatory Visit: Payer: Self-pay | Admitting: Cardiology

## 2023-10-18 LAB — ECHOCARDIOGRAM COMPLETE
AR max vel: 0.71 cm2
AV Area VTI: 0.86 cm2
AV Area mean vel: 0.74 cm2
AV Mean grad: 31 mmHg
AV Peak grad: 58.1 mmHg
Ao pk vel: 3.81 m/s
Area-P 1/2: 4.26 cm2
Calc EF: 60.6 %
MV VTI: 2.71 cm2
S' Lateral: 2.7 cm
Single Plane A2C EF: 61.6 %
Single Plane A4C EF: 59.6 %

## 2023-10-23 ENCOUNTER — Other Ambulatory Visit (HOSPITAL_BASED_OUTPATIENT_CLINIC_OR_DEPARTMENT_OTHER): Payer: Self-pay

## 2023-10-23 ENCOUNTER — Ambulatory Visit: Attending: Cardiology | Admitting: Cardiology

## 2023-10-23 ENCOUNTER — Encounter: Payer: Self-pay | Admitting: Cardiology

## 2023-10-23 VITALS — BP 128/72 | HR 87 | Ht 69.0 in | Wt 169.2 lb

## 2023-10-23 DIAGNOSIS — T82857D Stenosis of cardiac prosthetic devices, implants and grafts, subsequent encounter: Secondary | ICD-10-CM | POA: Diagnosis not present

## 2023-10-23 DIAGNOSIS — I25119 Atherosclerotic heart disease of native coronary artery with unspecified angina pectoris: Secondary | ICD-10-CM | POA: Diagnosis not present

## 2023-10-23 DIAGNOSIS — E782 Mixed hyperlipidemia: Secondary | ICD-10-CM | POA: Diagnosis not present

## 2023-10-23 DIAGNOSIS — Z953 Presence of xenogenic heart valve: Secondary | ICD-10-CM

## 2023-10-23 DIAGNOSIS — I10 Essential (primary) hypertension: Secondary | ICD-10-CM

## 2023-10-23 DIAGNOSIS — I480 Paroxysmal atrial fibrillation: Secondary | ICD-10-CM | POA: Diagnosis not present

## 2023-10-23 MED ORDER — IRBESARTAN 75 MG PO TABS
75.0000 mg | ORAL_TABLET | Freq: Every day | ORAL | 3 refills | Status: DC
  Filled 2023-10-23: qty 90, 90d supply, fill #0

## 2023-10-23 NOTE — Progress Notes (Signed)
 Cardiology Office Note  Date: 10/23/2023   ID: AISEA [name], DOB Nov 19, 1950, MRN [id number]  History of Present Illness: [name] is a 73 y.o. male last seen in March.  He is here for a follow-up visit.  He does not report any angina or specific change in stamina.  Stable NYHA class II-III dyspnea depending on level of activity.  He does use supplemental oxygen  as needed and follows with Dr. Darlean.  No reported sense of palpitations, no dizziness or syncope.  Medications reviewed.  He does not describe any obvious bleeding problems or stool changes on Eliquis .  He continues to follow with Dr. Alphonsa.  I reviewed his last lipid panel, LDL 55.  We discussed the results of his echocardiogram.  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  Physical Exam: VS:  BP 128/72 (BP Location: Right Arm)   Pulse 87   Ht 5' 9 (1.753 m)   Wt 169 lb 3.2 oz (76.7 kg)   SpO2 95%   BMI 24.99 kg/m , BMI Body mass index is 24.99 kg/m.  Wt Readings from Last 3 Encounters:  10/23/23 169 lb 3.2 oz (76.7 kg)  10/03/23 172 lb 3.2 oz (78.1 kg)  07/07/23 172 lb 3.2 oz (78.1 kg)    General: Patient appears comfortable at rest. HEENT: Conjunctiva and lids normal. Neck: Supple, no elevated JVP or carotid bruits. Lungs: Decreased breath sounds, nonlabored breathing at rest. Cardiac: Regular rate and rhythm, no S3, 2/6 systolic murmur. Extremities: No pitting edema.  ECG:  An ECG dated 04/19/2023 was personally reviewed today and demonstrated:  Sinus rhythm with right atrial enlargement, nonspecific ST-T changes.  Labwork: 06/23/2023: ALT 18; AST 23; BUN 12; Creatinine, Ser 0.60; Potassium 4.3; Sodium 139     Component Value Date/Time   CHOL 122 06/23/2023 0856   TRIG 79 06/23/2023 0856   HDL 51 06/23/2023 0856   CHOLHDL 2.4 06/23/2023 0856   CHOLHDL 3.1 02/19/2018 0838   VLDL 45 (H) 05/16/2016 0856   LDLCALC 55 06/23/2023 0856   LDLCALC 78  02/19/2018 0838   Other Studies Reviewed Today:  Echocardiogram 10/16/2023:  1. Left ventricular ejection fraction, by estimation, is 60 to 65%. The  left ventricle has normal function. The left ventricle has no regional  wall motion abnormalities. There is mild left ventricular hypertrophy.  Left ventricular diastolic parameters  are consistent with Grade I diastolic dysfunction (impaired relaxation).   2. Right ventricular systolic function is normal. The right ventricular  size is normal. Tricuspid regurgitation signal is inadequate for assessing  PA pressure.   3. The mitral valve is normal in structure. No evidence of mitral valve  regurgitation. No evidence of mitral stenosis.   4. 23 mm Edwards Magna Ease Pericardial Tissue Valve Model 3300 TFX is in  the AV position. Stable mild to moderately elevated mean gradient of 31  mmHg compared to 10/06/22 study.      . The aortic valve has been repaired/replaced. There is moderate  calcification of the aortic valve. There is moderate thickening of the  aortic valve. Aortic valve regurgitation is trivial. Aortic valve mean  gradient measures 31.0 mmHg.   5. The inferior vena cava is normal in size with greater than 50%  respiratory variability, suggesting right atrial pressure of 3 mmHg.   Assessment and Plan:  1.  CAD status post CABG with LIMA to LAD and SVG to circumflex in July 2018.  LVEF 60 to  65% by echocardiogram in September.  He reports no angina at current level of activity.  Continue Crestor  40 mg daily and Zetia  10 mg daily.   2.  History of aortic stenosis status post 23 mm Magna Ease pericardial AVR in July 2018.  Follow-up echocardiogram in September shows mean AV gradient 31 mmHg and dimensionless index 0.27 consistent with moderate prosthetic stenosis.  Reports NYHA class II-III dyspnea which is likely multifactorial.  Continue to follow, anticipate repeat echocardiogram in 1 year, sooner if clinical status worsens.   3.   Primary hypertension.  Blood pressure rechecked by me at 128/72.  Continue Avapro  75 mg daily.   4.  Mixed hyperlipidemia.  LDL 55 in May.  Continue Crestor  40 mg daily and Zetia  10 mg daily.   5.  Abdominal aortic aneurysm status post EVAR in November 2019.  Continues to follow with [facility].  I reviewed the office note from August.   6.  Paroxysmal atrial fibrillation with CHA2DS2-VASc score of 3.  Reports no interval palpitations and continues on Cardizem  60 mg twice daily and Eliquis  5 mg twice daily.  7.  COPD with chronic hypoxic respiratory failure, follows with Dr. Darlean.  Disposition:  Follow up 6 months.  Signed, Jayson JUDITHANN Sierras, M.D., F.A.C.C. [facility]

## 2023-10-23 NOTE — Patient Instructions (Addendum)

## 2023-10-24 ENCOUNTER — Other Ambulatory Visit: Payer: Self-pay | Admitting: Cardiology

## 2023-10-24 ENCOUNTER — Other Ambulatory Visit: Payer: Self-pay | Admitting: Family Medicine

## 2023-11-01 DIAGNOSIS — D225 Melanocytic nevi of trunk: Secondary | ICD-10-CM | POA: Diagnosis not present

## 2023-11-01 DIAGNOSIS — Z1283 Encounter for screening for malignant neoplasm of skin: Secondary | ICD-10-CM | POA: Diagnosis not present

## 2023-11-01 DIAGNOSIS — L720 Epidermal cyst: Secondary | ICD-10-CM | POA: Diagnosis not present

## 2023-11-01 DIAGNOSIS — L820 Inflamed seborrheic keratosis: Secondary | ICD-10-CM | POA: Diagnosis not present

## 2023-12-14 ENCOUNTER — Other Ambulatory Visit: Payer: Self-pay | Admitting: Family Medicine

## 2023-12-24 ENCOUNTER — Encounter: Payer: Self-pay | Admitting: Family Medicine

## 2023-12-31 ENCOUNTER — Other Ambulatory Visit: Payer: Self-pay | Admitting: Family Medicine

## 2024-01-02 ENCOUNTER — Ambulatory Visit: Payer: Self-pay | Admitting: Family Medicine

## 2024-01-02 LAB — BASIC METABOLIC PANEL WITH GFR
BUN/Creatinine Ratio: 16 (ref 10–24)
BUN: 10 mg/dL (ref 8–27)
CO2: 28 mmol/L (ref 20–29)
Calcium: 10.2 mg/dL (ref 8.6–10.2)
Chloride: 98 mmol/L (ref 96–106)
Creatinine, Ser: 0.62 mg/dL — ABNORMAL LOW (ref 0.76–1.27)
Glucose: 99 mg/dL (ref 70–99)
Potassium: 4.8 mmol/L (ref 3.5–5.2)
Sodium: 142 mmol/L (ref 134–144)
eGFR: 102 mL/min/1.73 (ref >59)

## 2024-01-02 LAB — HEPATIC FUNCTION PANEL
ALT: 20 IU/L (ref 0–44)
AST: 27 IU/L (ref 0–40)
Albumin: 5 g/dL — ABNORMAL HIGH (ref 3.8–4.8)
Alkaline Phosphatase: 43 IU/L — ABNORMAL LOW (ref 47–123)
Bilirubin Total: 0.4 mg/dL (ref 0.0–1.2)
Bilirubin, Direct: 0.18 mg/dL (ref 0.00–0.40)
Total Protein: 7.1 g/dL (ref 6.0–8.5)

## 2024-01-02 LAB — LIPID PANEL
Chol/HDL Ratio: 1.9 ratio (ref 0.0–5.0)
Cholesterol, Total: 118 mg/dL (ref 100–199)
HDL: 63 mg/dL (ref >39)
LDL Chol Calc (NIH): 40 mg/dL (ref 0–99)
Triglycerides: 75 mg/dL (ref 0–149)
VLDL Cholesterol Cal: 15 mg/dL (ref 5–40)

## 2024-01-02 LAB — MICROALBUMIN / CREATININE URINE RATIO
Creatinine, Urine: 120 mg/dL
Microalb/Creat Ratio: 18 mg/g{creat} (ref 0–29)
Microalbumin, Urine: 21.1 ug/mL

## 2024-01-02 LAB — HEMOGLOBIN A1C
Est. average glucose Bld gHb Est-mCnc: 114 mg/dL
Hgb A1c MFr Bld: 5.6 % (ref 4.8–5.6)

## 2024-01-09 ENCOUNTER — Ambulatory Visit: Admitting: Family Medicine

## 2024-01-09 ENCOUNTER — Encounter: Payer: Self-pay | Admitting: Family Medicine

## 2024-01-09 VITALS — BP 138/68 | HR 87 | Temp 97.9°F | Ht 69.0 in | Wt 179.0 lb

## 2024-01-09 DIAGNOSIS — I10 Essential (primary) hypertension: Secondary | ICD-10-CM

## 2024-01-09 DIAGNOSIS — J449 Chronic obstructive pulmonary disease, unspecified: Secondary | ICD-10-CM

## 2024-01-09 DIAGNOSIS — I480 Paroxysmal atrial fibrillation: Secondary | ICD-10-CM

## 2024-01-09 DIAGNOSIS — E785 Hyperlipidemia, unspecified: Secondary | ICD-10-CM

## 2024-01-09 MED ORDER — EZETIMIBE 10 MG PO TABS: 10.0000 mg | ORAL_TABLET | Freq: Every day | ORAL | 1 refills | Status: AC

## 2024-01-09 MED ORDER — ROSUVASTATIN CALCIUM 40 MG PO TABS: 40.0000 mg | ORAL_TABLET | Freq: Every day | ORAL | 1 refills | Status: AC

## 2024-01-09 MED ORDER — ESCITALOPRAM OXALATE 10 MG PO TABS: 10.0000 mg | ORAL_TABLET | Freq: Every day | ORAL | 1 refills | Status: AC

## 2024-01-09 MED ORDER — DILTIAZEM HCL 60 MG PO TABS: 60.0000 mg | ORAL_TABLET | Freq: Two times a day (BID) | ORAL | 1 refills | Status: AC

## 2024-01-09 NOTE — Progress Notes (Signed)
 Subjective:    Patient ID: [name], male    DOB: 09-11-1950, 73 y.o.   MRN: [id number]  HPI  6 month follow up  Discussed the use of AI scribe software for clinical note transcription with the patient, who gave verbal consent to proceed. Discussed the use of AI scribe software for clinical note transcription with the patient, who gave verbal consent to proceed.  History of Present Illness   [name] is a 73 year old male who presents for a routine follow-up visit.  He manages his breathing well as long as he avoids rushing. He experiences occasional days of feeling 'bottomed out' depending on his activities, with anxiety contributing to his symptoms. He takes an anxiety medication that helps alleviate these symptoms and tries to pace himself, especially on busy days. He uses oxygen  when active, particularly outdoors, and his oxygen  saturation is in the low 90s when at rest at home.  He maintains a relatively healthy diet, avoiding fast food and eating mostly home-cooked meals like baked chicken. He consumes two cups of coffee in the morning, tea, diet drinks, and about two bottles of water  daily. His bowel movements are regular without blood, and he urinates with a decent flow, waking once at night to urinate. He describes his mood as 'pretty good' and does not feel negative.  He is consistent with his medications, which are organized by his wife, and finds them affordable despite some being expensive. He checks his blood pressure weekly. He has changed his insurance plan, which now requires referrals for specialist visits.  He received his flu shot, pneumonia vaccine, and COVID vaccine on October 23rd. He had lab work done a week ago. His PSA was in the threes.  He is cautious with physical activities, avoiding high ladders due to a past ankle injury that required surgery and left him with screws and a plate.     We did review his lab work in detail today He is  consistent with his medicines He sees cardiology who twice a year and sees pulmonary once a year and vascular surgeon once a year I reminded him to go to class I appointment occur so we can put in the referral I had a time   Review of Systems     Objective:   Physical Exam General-in no acute distress Eyes-no discharge Lungs-respiratory rate normal, CTA CV-no murmurs,RRR Extremities skin warm dry no edema Neuro grossly normal Behavior normal, alert   Assessment and Plan    Essential hypertension Blood pressure improved to 138/68 mmHg, well-controlled with current medication. - Continue current antihypertensive regimen. - Monitor blood pressure weekly.  Chronic obstructive pulmonary disease COPD well-managed, oxygen  saturation in low 90s at rest, occasional supplemental oxygen  needed during activities. - Use supplemental oxygen  as needed during activities.  Paroxysmal atrial fibrillation - Continue anticoagulation therapy with Eliquis .  Hyperlipidemia Well-controlled, recent labs show LDL at 4 mg/dL. - Continue current lipid-lowering therapy with rosuvastatin .  General Health Maintenance Up to date on flu, pneumonia, and COVID vaccines. TDAP overdue by more than ten years. Normal kidney function, normal A1c, excellent cholesterol profile. - Administer TDAP vaccine through pharmacy. - Continue routine health maintenance and screenings.           Assessment & Plan:  1. Essential hypertension (Primary) Blood pressure good control continue current measures  2. COPD GOLD 3 with hypoxia (HCC) Off of oxygen  his O2 drops down to 84 or less with oxygen  97 does  well with his medicines  3. Paroxysmal atrial fibrillation (HCC) Uses his Eliquis  regular basis under good control no bleeding issues  4. Hyperlipidemia, unspecified hyperlipidemia type LDL well below 55 continue current measures  Follow-up again in 6 months

## 2024-02-23 ENCOUNTER — Encounter: Payer: Self-pay | Admitting: Internal Medicine

## 2024-03-09 ENCOUNTER — Encounter: Payer: Self-pay | Admitting: Family Medicine

## 2024-03-12 ENCOUNTER — Other Ambulatory Visit: Payer: Self-pay | Admitting: Nurse Practitioner

## 2024-03-12 DIAGNOSIS — J9611 Chronic respiratory failure with hypoxia: Secondary | ICD-10-CM

## 2024-03-12 DIAGNOSIS — I10 Essential (primary) hypertension: Secondary | ICD-10-CM

## 2024-03-12 DIAGNOSIS — I480 Paroxysmal atrial fibrillation: Secondary | ICD-10-CM

## 2024-03-12 DIAGNOSIS — I25119 Atherosclerotic heart disease of native coronary artery with unspecified angina pectoris: Secondary | ICD-10-CM

## 2024-03-12 DIAGNOSIS — J449 Chronic obstructive pulmonary disease, unspecified: Secondary | ICD-10-CM

## 2024-03-12 NOTE — Telephone Encounter (Signed)
 Nurses Please do these referrals as required by his insurance Dr. Debera is cardiology Dr.Wert is pulmonary

## 2024-04-16 ENCOUNTER — Ambulatory Visit: Admitting: Cardiology

## 2024-05-22 ENCOUNTER — Ambulatory Visit: Admitting: Internal Medicine

## 2024-07-10 ENCOUNTER — Ambulatory Visit: Admitting: Family Medicine
